# Patient Record
Sex: Female | Born: 1935
Health system: Southern US, Community
[De-identification: ages and names within clinical notes are randomized; demographics above are authoritative.]

## PROBLEM LIST (undated history)

## (undated) DIAGNOSIS — I4891 Unspecified atrial fibrillation: Secondary | ICD-10-CM

## (undated) DIAGNOSIS — E079 Disorder of thyroid, unspecified: Secondary | ICD-10-CM

## (undated) DIAGNOSIS — F419 Anxiety disorder, unspecified: Secondary | ICD-10-CM

## (undated) DIAGNOSIS — I639 Cerebral infarction, unspecified: Secondary | ICD-10-CM

## (undated) DIAGNOSIS — J40 Bronchitis, not specified as acute or chronic: Secondary | ICD-10-CM

## (undated) DIAGNOSIS — I2510 Atherosclerotic heart disease of native coronary artery without angina pectoris: Secondary | ICD-10-CM

## (undated) DIAGNOSIS — Z950 Presence of cardiac pacemaker: Secondary | ICD-10-CM

## (undated) DIAGNOSIS — A809 Acute poliomyelitis, unspecified: Secondary | ICD-10-CM

## (undated) DIAGNOSIS — Z9289 Personal history of other medical treatment: Secondary | ICD-10-CM

## (undated) DIAGNOSIS — M1990 Unspecified osteoarthritis, unspecified site: Secondary | ICD-10-CM

## (undated) DIAGNOSIS — I10 Essential (primary) hypertension: Secondary | ICD-10-CM

## (undated) HISTORY — PX: APPENDECTOMY: SHX54

## (undated) HISTORY — PX: TONSILLECTOMY: SUR1361

## (undated) HISTORY — PX: CORONARY ANGIOPLASTY: SHX604

## (undated) HISTORY — DX: Essential (primary) hypertension: I10

## (undated) HISTORY — PX: OTHER SURGICAL HISTORY: SHX169

## (undated) HISTORY — DX: Personal history of other medical treatment: Z92.89

## (undated) HISTORY — DX: Acute poliomyelitis, unspecified: A80.9

## (undated) HISTORY — PX: CARDIAC SURGERY: SHX584

---

## 1976-11-29 HISTORY — PX: ABDOMINAL HYSTERECTOMY: SHX81

## 2001-09-14 ENCOUNTER — Encounter: Payer: Self-pay | Admitting: Internal Medicine

## 2001-09-14 ENCOUNTER — Ambulatory Visit (HOSPITAL_COMMUNITY): Admission: RE | Admit: 2001-09-14 | Discharge: 2001-09-14 | Payer: Self-pay | Admitting: Internal Medicine

## 2001-12-14 ENCOUNTER — Encounter: Payer: Self-pay | Admitting: Internal Medicine

## 2001-12-14 ENCOUNTER — Ambulatory Visit (HOSPITAL_COMMUNITY): Admission: RE | Admit: 2001-12-14 | Discharge: 2001-12-14 | Payer: Self-pay | Admitting: Internal Medicine

## 2002-08-20 ENCOUNTER — Encounter: Payer: Self-pay | Admitting: Internal Medicine

## 2002-08-20 ENCOUNTER — Ambulatory Visit (HOSPITAL_COMMUNITY): Admission: RE | Admit: 2002-08-20 | Discharge: 2002-08-20 | Payer: Self-pay | Admitting: Internal Medicine

## 2003-07-24 ENCOUNTER — Emergency Department (HOSPITAL_COMMUNITY): Admission: EM | Admit: 2003-07-24 | Discharge: 2003-07-24 | Payer: Self-pay | Admitting: Emergency Medicine

## 2004-08-13 ENCOUNTER — Other Ambulatory Visit: Admission: RE | Admit: 2004-08-13 | Discharge: 2004-08-13 | Payer: Self-pay | Admitting: Dermatology

## 2005-01-05 ENCOUNTER — Other Ambulatory Visit: Admission: RE | Admit: 2005-01-05 | Discharge: 2005-01-05 | Payer: Self-pay | Admitting: Obstetrics and Gynecology

## 2005-05-28 ENCOUNTER — Ambulatory Visit (HOSPITAL_COMMUNITY): Admission: RE | Admit: 2005-05-28 | Discharge: 2005-05-28 | Payer: Self-pay | Admitting: Internal Medicine

## 2005-06-02 ENCOUNTER — Ambulatory Visit (HOSPITAL_COMMUNITY): Admission: RE | Admit: 2005-06-02 | Discharge: 2005-06-02 | Payer: Self-pay | Admitting: Internal Medicine

## 2005-06-07 ENCOUNTER — Ambulatory Visit (HOSPITAL_COMMUNITY): Admission: RE | Admit: 2005-06-07 | Discharge: 2005-06-07 | Payer: Self-pay | Admitting: Internal Medicine

## 2005-06-24 ENCOUNTER — Ambulatory Visit (HOSPITAL_COMMUNITY): Admission: RE | Admit: 2005-06-24 | Discharge: 2005-06-24 | Payer: Self-pay | Admitting: Internal Medicine

## 2005-11-24 ENCOUNTER — Ambulatory Visit: Payer: Self-pay | Admitting: Cardiology

## 2005-11-24 ENCOUNTER — Inpatient Hospital Stay (HOSPITAL_COMMUNITY): Admission: AD | Admit: 2005-11-24 | Discharge: 2005-11-28 | Payer: Self-pay | Admitting: Internal Medicine

## 2006-01-19 ENCOUNTER — Ambulatory Visit: Payer: Self-pay | Admitting: *Deleted

## 2006-01-25 ENCOUNTER — Ambulatory Visit: Payer: Self-pay | Admitting: Cardiology

## 2006-01-25 ENCOUNTER — Encounter (HOSPITAL_COMMUNITY): Admission: RE | Admit: 2006-01-25 | Discharge: 2006-02-24 | Payer: Self-pay | Admitting: *Deleted

## 2006-01-28 ENCOUNTER — Ambulatory Visit: Payer: Self-pay | Admitting: *Deleted

## 2006-06-14 ENCOUNTER — Emergency Department (HOSPITAL_COMMUNITY): Admission: EM | Admit: 2006-06-14 | Discharge: 2006-06-14 | Payer: Self-pay | Admitting: Emergency Medicine

## 2006-06-20 ENCOUNTER — Ambulatory Visit: Payer: Self-pay | Admitting: Orthopedic Surgery

## 2006-06-22 ENCOUNTER — Encounter (HOSPITAL_COMMUNITY): Admission: RE | Admit: 2006-06-22 | Discharge: 2006-07-22 | Payer: Self-pay | Admitting: Orthopedic Surgery

## 2006-07-06 ENCOUNTER — Ambulatory Visit: Payer: Self-pay | Admitting: Orthopedic Surgery

## 2006-07-25 ENCOUNTER — Encounter (HOSPITAL_COMMUNITY): Admission: RE | Admit: 2006-07-25 | Discharge: 2006-08-24 | Payer: Self-pay | Admitting: Orthopedic Surgery

## 2006-08-03 ENCOUNTER — Ambulatory Visit: Payer: Self-pay | Admitting: Orthopedic Surgery

## 2006-09-08 ENCOUNTER — Ambulatory Visit (HOSPITAL_COMMUNITY): Admission: RE | Admit: 2006-09-08 | Discharge: 2006-09-08 | Payer: Self-pay | Admitting: Internal Medicine

## 2006-09-23 ENCOUNTER — Encounter: Admission: RE | Admit: 2006-09-23 | Discharge: 2006-09-23 | Payer: Self-pay | Admitting: Internal Medicine

## 2007-04-11 ENCOUNTER — Encounter: Admission: RE | Admit: 2007-04-11 | Discharge: 2007-04-11 | Payer: Self-pay | Admitting: Internal Medicine

## 2007-11-03 ENCOUNTER — Encounter: Admission: RE | Admit: 2007-11-03 | Discharge: 2007-11-03 | Payer: Self-pay | Admitting: Internal Medicine

## 2007-12-20 ENCOUNTER — Emergency Department (HOSPITAL_COMMUNITY): Admission: EM | Admit: 2007-12-20 | Discharge: 2007-12-20 | Payer: Self-pay | Admitting: Emergency Medicine

## 2007-12-31 DIAGNOSIS — I2510 Atherosclerotic heart disease of native coronary artery without angina pectoris: Secondary | ICD-10-CM

## 2007-12-31 HISTORY — DX: Atherosclerotic heart disease of native coronary artery without angina pectoris: I25.10

## 2008-01-07 ENCOUNTER — Inpatient Hospital Stay (HOSPITAL_COMMUNITY): Admission: EM | Admit: 2008-01-07 | Discharge: 2008-01-16 | Payer: Self-pay | Admitting: Emergency Medicine

## 2008-01-07 ENCOUNTER — Ambulatory Visit: Payer: Self-pay | Admitting: Internal Medicine

## 2008-01-08 ENCOUNTER — Ambulatory Visit: Payer: Self-pay | Admitting: Internal Medicine

## 2008-01-10 ENCOUNTER — Encounter: Payer: Self-pay | Admitting: Cardiovascular Disease

## 2008-01-11 ENCOUNTER — Ambulatory Visit: Payer: Self-pay | Admitting: *Deleted

## 2008-01-12 ENCOUNTER — Encounter: Payer: Self-pay | Admitting: Cardiovascular Disease

## 2008-01-23 ENCOUNTER — Ambulatory Visit: Payer: Self-pay | Admitting: Cardiology

## 2008-02-01 ENCOUNTER — Ambulatory Visit: Payer: Self-pay | Admitting: Internal Medicine

## 2008-02-01 LAB — CONVERTED CEMR LAB
Basophils Relative: 1.3 % — ABNORMAL HIGH (ref 0.0–1.0)
Chloride: 98 meq/L (ref 96–112)
Creatinine, Ser: 0.6 mg/dL (ref 0.4–1.2)
Eosinophils Relative: 1.9 % (ref 0.0–5.0)
Glucose, Bld: 71 mg/dL (ref 70–99)
HCT: 41.1 % (ref 36.0–46.0)
Neutrophils Relative %: 68.3 % (ref 43.0–77.0)
RBC: 4.7 M/uL (ref 3.87–5.11)
RDW: 14.2 % (ref 11.5–14.6)
Sodium: 139 meq/L (ref 135–145)
WBC: 9.6 10*3/uL (ref 4.5–10.5)

## 2008-03-07 ENCOUNTER — Encounter (HOSPITAL_COMMUNITY): Admission: RE | Admit: 2008-03-07 | Discharge: 2008-04-06 | Payer: Self-pay | Admitting: Internal Medicine

## 2008-04-24 ENCOUNTER — Inpatient Hospital Stay (HOSPITAL_COMMUNITY): Admission: EM | Admit: 2008-04-24 | Discharge: 2008-04-25 | Payer: Self-pay | Admitting: Emergency Medicine

## 2008-05-03 ENCOUNTER — Emergency Department (HOSPITAL_COMMUNITY): Admission: EM | Admit: 2008-05-03 | Discharge: 2008-05-03 | Payer: Self-pay | Admitting: Emergency Medicine

## 2008-12-19 ENCOUNTER — Encounter: Admission: RE | Admit: 2008-12-19 | Discharge: 2008-12-19 | Payer: Self-pay | Admitting: Internal Medicine

## 2008-12-24 ENCOUNTER — Encounter: Admission: RE | Admit: 2008-12-24 | Discharge: 2008-12-24 | Payer: Self-pay | Admitting: Internal Medicine

## 2009-01-17 HISTORY — PX: TRANSTHORACIC ECHOCARDIOGRAM: SHX275

## 2009-02-02 ENCOUNTER — Emergency Department (HOSPITAL_COMMUNITY): Admission: EM | Admit: 2009-02-02 | Discharge: 2009-02-02 | Payer: Self-pay | Admitting: Emergency Medicine

## 2009-04-11 ENCOUNTER — Ambulatory Visit (HOSPITAL_COMMUNITY): Admission: RE | Admit: 2009-04-11 | Discharge: 2009-04-11 | Payer: Self-pay | Admitting: Internal Medicine

## 2009-05-13 ENCOUNTER — Emergency Department (HOSPITAL_COMMUNITY): Admission: EM | Admit: 2009-05-13 | Discharge: 2009-05-13 | Payer: Self-pay | Admitting: Pediatrics

## 2009-10-21 ENCOUNTER — Inpatient Hospital Stay (HOSPITAL_COMMUNITY): Admission: EM | Admit: 2009-10-21 | Discharge: 2009-10-21 | Payer: Self-pay | Admitting: Emergency Medicine

## 2009-10-22 HISTORY — PX: OTHER SURGICAL HISTORY: SHX169

## 2010-01-16 ENCOUNTER — Encounter: Admission: RE | Admit: 2010-01-16 | Discharge: 2010-01-16 | Payer: Self-pay | Admitting: Internal Medicine

## 2010-06-13 ENCOUNTER — Encounter: Payer: Self-pay | Admitting: Emergency Medicine

## 2010-06-14 ENCOUNTER — Inpatient Hospital Stay (HOSPITAL_COMMUNITY): Admission: EM | Admit: 2010-06-14 | Discharge: 2010-06-22 | Payer: Self-pay | Admitting: Cardiovascular Disease

## 2010-08-11 ENCOUNTER — Observation Stay (HOSPITAL_COMMUNITY): Admission: RE | Admit: 2010-08-11 | Discharge: 2010-08-12 | Payer: Self-pay | Admitting: Cardiology

## 2010-08-11 HISTORY — PX: PACEMAKER PLACEMENT: SHX43

## 2010-12-20 ENCOUNTER — Encounter: Payer: Self-pay | Admitting: Internal Medicine

## 2011-01-27 ENCOUNTER — Other Ambulatory Visit (HOSPITAL_COMMUNITY): Payer: Self-pay | Admitting: Internal Medicine

## 2011-01-27 ENCOUNTER — Ambulatory Visit (HOSPITAL_COMMUNITY)
Admission: RE | Admit: 2011-01-27 | Discharge: 2011-01-27 | Disposition: A | Discharge status: Home / Self Care | Payer: Medicare Other | Source: Ambulatory Visit | Attending: Internal Medicine | Admitting: Internal Medicine

## 2011-01-27 DIAGNOSIS — I739 Peripheral vascular disease, unspecified: Secondary | ICD-10-CM

## 2011-01-27 DIAGNOSIS — M79609 Pain in unspecified limb: Secondary | ICD-10-CM | POA: Insufficient documentation

## 2011-01-27 DIAGNOSIS — L97509 Non-pressure chronic ulcer of other part of unspecified foot with unspecified severity: Secondary | ICD-10-CM | POA: Insufficient documentation

## 2011-02-11 LAB — GLUCOSE, CAPILLARY
Glucose-Capillary: 100 mg/dL — ABNORMAL HIGH (ref 70–99)
Glucose-Capillary: 133 mg/dL — ABNORMAL HIGH (ref 70–99)

## 2011-02-11 LAB — CBC
Hemoglobin: 12 g/dL (ref 12.0–15.0)
MCH: 28 pg (ref 26.0–34.0)
MCV: 87.6 fL (ref 78.0–100.0)
RBC: 4.29 MIL/uL (ref 3.87–5.11)
WBC: 6.8 10*3/uL (ref 4.0–10.5)

## 2011-02-11 LAB — BASIC METABOLIC PANEL
CO2: 27 mEq/L (ref 19–32)
Calcium: 9 mg/dL (ref 8.4–10.5)
Chloride: 105 mEq/L (ref 96–112)
GFR calc Af Amer: >60 mL/min (ref >60)
Sodium: 140 mEq/L (ref 135–145)

## 2011-02-11 LAB — SURGICAL PCR SCREEN
MRSA, PCR: NEGATIVE
Staphylococcus aureus: POSITIVE — AB

## 2011-02-11 LAB — PROTIME-INR: Prothrombin Time: 18 seconds — ABNORMAL HIGH (ref 11.6–15.2)

## 2011-02-11 LAB — APTT: aPTT: 33 seconds (ref 24–37)

## 2011-02-13 LAB — BASIC METABOLIC PANEL
CO2: 27 mEq/L (ref 19–32)
CO2: 29 mEq/L (ref 19–32)
CO2: 30 mEq/L (ref 19–32)
Calcium: 9.4 mg/dL (ref 8.4–10.5)
Calcium: 8.7 mg/dL (ref 8.4–10.5)
Calcium: 8.8 mg/dL (ref 8.4–10.5)
Calcium: 9 mg/dL (ref 8.4–10.5)
Chloride: 102 mEq/L (ref 96–112)
Chloride: 103 mEq/L (ref 96–112)
Chloride: 104 mEq/L (ref 96–112)
Chloride: 104 mEq/L (ref 96–112)
Creatinine, Ser: 0.64 mg/dL (ref 0.4–1.2)
Creatinine, Ser: 0.62 mg/dL (ref 0.4–1.2)
Creatinine, Ser: 0.66 mg/dL (ref 0.4–1.2)
Creatinine, Ser: 0.67 mg/dL (ref 0.4–1.2)
Creatinine, Ser: 0.69 mg/dL (ref 0.4–1.2)
GFR calc Af Amer: >60 mL/min (ref >60)
GFR calc Af Amer: >60 mL/min (ref >60)
GFR calc Af Amer: >60 mL/min (ref >60)
GFR calc Af Amer: >60 mL/min (ref >60)
GFR calc Af Amer: >60 mL/min (ref >60)
GFR calc non Af Amer: >60 mL/min (ref >60)
GFR calc non Af Amer: >60 mL/min (ref >60)
GFR calc non Af Amer: >60 mL/min (ref >60)
GFR calc non Af Amer: >60 mL/min (ref >60)
Glucose, Bld: 100 mg/dL — ABNORMAL HIGH (ref 70–99)
Glucose, Bld: 91 mg/dL (ref 70–99)
Glucose, Bld: 92 mg/dL (ref 70–99)
Potassium: 3.7 mEq/L (ref 3.5–5.1)
Potassium: 3.8 mEq/L (ref 3.5–5.1)
Potassium: 3.9 mEq/L (ref 3.5–5.1)
Sodium: 136 mEq/L (ref 135–145)
Sodium: 140 mEq/L (ref 135–145)
Sodium: 141 mEq/L (ref 135–145)
Sodium: 141 mEq/L (ref 135–145)

## 2011-02-13 LAB — GLUCOSE, CAPILLARY
Glucose-Capillary: 102 mg/dL — ABNORMAL HIGH (ref 70–99)
Glucose-Capillary: 102 mg/dL — ABNORMAL HIGH (ref 70–99)
Glucose-Capillary: 104 mg/dL — ABNORMAL HIGH (ref 70–99)
Glucose-Capillary: 107 mg/dL — ABNORMAL HIGH (ref 70–99)
Glucose-Capillary: 110 mg/dL — ABNORMAL HIGH (ref 70–99)
Glucose-Capillary: 111 mg/dL — ABNORMAL HIGH (ref 70–99)
Glucose-Capillary: 129 mg/dL — ABNORMAL HIGH (ref 70–99)
Glucose-Capillary: 130 mg/dL — ABNORMAL HIGH (ref 70–99)
Glucose-Capillary: 133 mg/dL — ABNORMAL HIGH (ref 70–99)
Glucose-Capillary: 140 mg/dL — ABNORMAL HIGH (ref 70–99)
Glucose-Capillary: 160 mg/dL — ABNORMAL HIGH (ref 70–99)
Glucose-Capillary: 86 mg/dL (ref 70–99)
Glucose-Capillary: 86 mg/dL (ref 70–99)
Glucose-Capillary: 91 mg/dL (ref 70–99)
Glucose-Capillary: 95 mg/dL (ref 70–99)
Glucose-Capillary: 95 mg/dL (ref 70–99)
Glucose-Capillary: 95 mg/dL (ref 70–99)
Glucose-Capillary: 98 mg/dL (ref 70–99)

## 2011-02-13 LAB — CBC
HCT: 40.7 % (ref 36.0–46.0)
Hemoglobin: 12.7 g/dL (ref 12.0–15.0)
Hemoglobin: 12.8 g/dL (ref 12.0–15.0)
Hemoglobin: 12.8 g/dL (ref 12.0–15.0)
Hemoglobin: 13.3 g/dL (ref 12.0–15.0)
Hemoglobin: 13.4 g/dL (ref 12.0–15.0)
MCH: 28.7 pg (ref 26.0–34.0)
MCH: 28.8 pg (ref 26.0–34.0)
MCH: 29.1 pg (ref 26.0–34.0)
MCH: 29.5 pg (ref 26.0–34.0)
MCH: 29.6 pg (ref 26.0–34.0)
MCHC: 32.3 g/dL (ref 30.0–36.0)
MCHC: 33 g/dL (ref 30.0–36.0)
MCHC: 33 g/dL (ref 30.0–36.0)
MCHC: 33.3 g/dL (ref 30.0–36.0)
MCHC: 33.4 g/dL (ref 30.0–36.0)
MCHC: 33.4 g/dL (ref 30.0–36.0)
MCV: 88 fL (ref 78.0–100.0)
MCV: 88.2 fL (ref 78.0–100.0)
MCV: 88.8 fL (ref 78.0–100.0)
Platelets: 189 10*3/uL (ref 150–400)
Platelets: 158 10*3/uL (ref 150–400)
Platelets: 177 10*3/uL (ref 150–400)
Platelets: 177 10*3/uL (ref 150–400)
Platelets: 184 10*3/uL (ref 150–400)
RBC: 4.82 MIL/uL (ref 3.87–5.11)
RBC: 4.36 MIL/uL (ref 3.87–5.11)
RBC: 4.39 MIL/uL (ref 3.87–5.11)
RBC: 4.42 MIL/uL (ref 3.87–5.11)
RBC: 4.47 MIL/uL (ref 3.87–5.11)
RBC: 4.6 MIL/uL (ref 3.87–5.11)
RBC: 4.61 MIL/uL (ref 3.87–5.11)
RDW: 15.1 % (ref 11.5–15.5)
RDW: 15.1 % (ref 11.5–15.5)
RDW: 15.2 % (ref 11.5–15.5)
WBC: 8.2 10*3/uL (ref 4.0–10.5)
WBC: 7.9 10*3/uL (ref 4.0–10.5)
WBC: 8.1 10*3/uL (ref 4.0–10.5)
WBC: 8.3 10*3/uL (ref 4.0–10.5)
WBC: 9.4 10*3/uL (ref 4.0–10.5)

## 2011-02-13 LAB — PROTIME-INR
INR: 2.02 — ABNORMAL HIGH (ref 0.00–1.49)
INR: 1.45 (ref 0.00–1.49)
INR: 1.82 — ABNORMAL HIGH (ref 0.00–1.49)
INR: 2.12 — ABNORMAL HIGH (ref 0.00–1.49)
INR: 2.14 — ABNORMAL HIGH (ref 0.00–1.49)
Prothrombin Time: 22.7 seconds — ABNORMAL HIGH (ref 11.6–15.2)
Prothrombin Time: 17.5 seconds — ABNORMAL HIGH (ref 11.6–15.2)
Prothrombin Time: 20.9 seconds — ABNORMAL HIGH (ref 11.6–15.2)
Prothrombin Time: 21 seconds — ABNORMAL HIGH (ref 11.6–15.2)
Prothrombin Time: 23.6 seconds — ABNORMAL HIGH (ref 11.6–15.2)
Prothrombin Time: 23.7 seconds — ABNORMAL HIGH (ref 11.6–15.2)
Prothrombin Time: 23.7 seconds — ABNORMAL HIGH (ref 11.6–15.2)

## 2011-02-13 LAB — LIPID PANEL
Cholesterol: 191 mg/dL (ref 0–200)
Total CHOL/HDL Ratio: 3.6 RATIO

## 2011-02-13 LAB — TSH
TSH: 0.186 u[IU]/mL — ABNORMAL LOW (ref 0.350–4.500)
TSH: 0.581 u[IU]/mL (ref 0.350–4.500)

## 2011-02-13 LAB — CARDIAC PANEL(CRET KIN+CKTOT+MB+TROPI)
CK, MB: 1.2 ng/mL (ref 0.3–4.0)
CK, MB: 1.6 ng/mL (ref 0.3–4.0)
Total CK: 58 U/L (ref 7–177)
Total CK: 74 U/L (ref 7–177)
Troponin I: 0.01 ng/mL (ref 0.00–0.06)

## 2011-02-13 LAB — HEPARIN LEVEL (UNFRACTIONATED)
Heparin Unfractionated: 0.6 IU/mL (ref 0.30–0.70)
Heparin Unfractionated: 0.71 IU/mL — ABNORMAL HIGH (ref 0.30–0.70)
Heparin Unfractionated: 0.85 IU/mL — ABNORMAL HIGH (ref 0.30–0.70)
Heparin Unfractionated: <0.1 IU/mL — ABNORMAL LOW (ref 0.30–0.70)

## 2011-02-13 LAB — DIFFERENTIAL
Eosinophils Absolute: 1 10*3/uL — ABNORMAL HIGH (ref 0.0–0.7)
Lymphocytes Relative: 26 % (ref 12–46)
Lymphs Abs: 2.2 10*3/uL (ref 0.7–4.0)
Neutrophils Relative %: 53 % (ref 43–77)

## 2011-02-13 LAB — HEPATIC FUNCTION PANEL
ALT: 16 U/L (ref 0–35)
AST: 22 U/L (ref 0–37)
Indirect Bilirubin: 0.5 mg/dL (ref 0.3–0.9)
Total Protein: 6.3 g/dL (ref 6.0–8.3)

## 2011-02-13 LAB — CEA: CEA: 0.6 ng/mL (ref 0.0–5.0)

## 2011-02-13 LAB — POCT CARDIAC MARKERS
CKMB, poc: 1.1 ng/mL (ref 1.0–8.0)
CKMB, poc: 1.3 ng/mL (ref 1.0–8.0)
Myoglobin, poc: 56.5 ng/mL (ref 12–200)

## 2011-02-13 LAB — T3: T3, Total: 91.1 ng/dl (ref 80.0–204.0)

## 2011-02-13 LAB — APTT
aPTT: 37 seconds (ref 24–37)
aPTT: 38 seconds — ABNORMAL HIGH (ref 24–37)

## 2011-02-13 LAB — URINALYSIS, MICROSCOPIC ONLY
Hgb urine dipstick: NEGATIVE
Ketones, ur: NEGATIVE mg/dL
Protein, ur: NEGATIVE mg/dL
Urobilinogen, UA: 1 mg/dL (ref 0.0–1.0)

## 2011-02-13 LAB — T4: T4, Total: 7.7 ug/dL (ref 5.0–12.5)

## 2011-02-13 LAB — MAGNESIUM: Magnesium: 2 mg/dL (ref 1.5–2.5)

## 2011-03-03 LAB — LIPID PANEL
LDL Cholesterol: 127 mg/dL — ABNORMAL HIGH (ref 0–99)
Total CHOL/HDL Ratio: 3.9 RATIO
Triglycerides: 94 mg/dL (ref <150)
VLDL: 19 mg/dL (ref 0–40)

## 2011-03-03 LAB — CBC
HCT: 39.1 % (ref 36.0–46.0)
MCHC: 33.2 g/dL (ref 30.0–36.0)
MCHC: 33.3 g/dL (ref 30.0–36.0)
MCV: 89.1 fL (ref 78.0–100.0)
Platelets: 176 10*3/uL (ref 150–400)
Platelets: 187 10*3/uL (ref 150–400)
RDW: 14 % (ref 11.5–15.5)
RDW: 14.2 % (ref 11.5–15.5)
WBC: 7.1 10*3/uL (ref 4.0–10.5)

## 2011-03-03 LAB — BASIC METABOLIC PANEL
BUN: 16 mg/dL (ref 6–23)
CO2: 29 mEq/L (ref 19–32)
GFR calc non Af Amer: >60 mL/min (ref >60)
Glucose, Bld: 157 mg/dL — ABNORMAL HIGH (ref 70–99)
Potassium: 3.7 mEq/L (ref 3.5–5.1)

## 2011-03-03 LAB — CARDIAC PANEL(CRET KIN+CKTOT+MB+TROPI)
Relative Index: 2.3 (ref 0.0–2.5)
Troponin I: 0.01 ng/mL (ref 0.00–0.06)

## 2011-03-03 LAB — DIFFERENTIAL
Basophils Absolute: 0.1 10*3/uL (ref 0.0–0.1)
Basophils Relative: 1 % (ref 0–1)
Eosinophils Absolute: 0.1 10*3/uL (ref 0.0–0.7)
Eosinophils Relative: 3 % (ref 0–5)
Lymphocytes Relative: 33 % (ref 12–46)

## 2011-03-03 LAB — PROTIME-INR: Prothrombin Time: 31.2 seconds — ABNORMAL HIGH (ref 11.6–15.2)

## 2011-03-03 LAB — CK TOTAL AND CKMB (NOT AT ARMC)
CK, MB: 2.8 ng/mL (ref 0.3–4.0)
Relative Index: 2.5 (ref 0.0–2.5)

## 2011-03-03 LAB — POCT I-STAT, CHEM 8
Calcium, Ion: 1.14 mmol/L (ref 1.12–1.32)
Glucose, Bld: 85 mg/dL (ref 70–99)
HCT: 44 % (ref 36.0–46.0)
Hemoglobin: 15 g/dL (ref 12.0–15.0)
TCO2: 29 mmol/L (ref 0–100)

## 2011-03-03 LAB — TROPONIN I: Troponin I: 0.03 ng/mL (ref 0.00–0.06)

## 2011-03-03 LAB — POCT CARDIAC MARKERS
CKMB, poc: 1.6 ng/mL (ref 1.0–8.0)
Troponin i, poc: <0.05 ng/mL (ref 0.00–0.09)

## 2011-03-08 LAB — DIFFERENTIAL
Basophils Absolute: 0 10*3/uL (ref 0.0–0.1)
Basophils Relative: 0 % (ref 0–1)
Lymphocytes Relative: 26 % (ref 12–46)
Monocytes Absolute: 0.6 10*3/uL (ref 0.1–1.0)
Monocytes Relative: 8 % (ref 3–12)
Neutro Abs: 4.9 10*3/uL (ref 1.7–7.7)
Neutrophils Relative %: 64 % (ref 43–77)

## 2011-03-08 LAB — CBC
HCT: 40.1 % (ref 36.0–46.0)
Hemoglobin: 13.9 g/dL (ref 12.0–15.0)
MCHC: 34.5 g/dL (ref 30.0–36.0)
MCV: 87.7 fL (ref 78.0–100.0)
Platelets: 160 10*3/uL (ref 150–400)
RDW: 14.5 % (ref 11.5–15.5)

## 2011-03-08 LAB — COMPREHENSIVE METABOLIC PANEL
Albumin: 3.9 g/dL (ref 3.5–5.2)
BUN: 19 mg/dL (ref 6–23)
Calcium: 9.2 mg/dL (ref 8.4–10.5)
Creatinine, Ser: 0.67 mg/dL (ref 0.4–1.2)
Glucose, Bld: 89 mg/dL (ref 70–99)
Total Protein: 6.2 g/dL (ref 6.0–8.3)

## 2011-04-13 NOTE — Cardiovascular Report (Signed)
[name], [name]                ACCOUNT NO.:  [id number]   MEDICAL RECORD NO.:  [id number]          PATIENT TYPE:  INP   LOCATION:  2921                         FACILITY:  [facility]   PHYSICIAN:  Veverly Fells. Excell Seltzer, MD  DATE OF BIRTH:  1930-05-21   DATE OF PROCEDURE:  01/10/2008  DATE OF DISCHARGE:                            CARDIAC CATHETERIZATION   PROCEDURE:  PTCA and stenting of the left circumflex.   INDICATIONS:  [name] is a 77 year old woman who presented with  unstable angina and atrial fibrillation.  She has multiple cardiac risk  factors.  She underwent diagnostic catheterization earlier today by Dr.  [name] and was found to have critical stenosis of the left circumflex  with a 99% lesion in the mid vessel.  The remaining portions of the  vessel look relatively normal, and there is no other significant  obstructive disease in the LAD or right coronary artery.  We elected to  proceed with PCI.  I plan is to treat her with a bare metal stent in the  setting of atrial fibrillation and need for anticoagulation with  Coumadin.   Risks and indications of the procedure were reviewed with the patient.  Informed consent was obtained.  The right femoral artery had been  accessed, and there was a sheath in place.  Using normal sterile  technique, the sheath was changed out for a new 6-French sheath over a  short guide wire.  Angiomax was used for anticoagulation.  The patient  had been preloaded with Plavix.  Once a therapeutic ACT was achieved, an  XB 3.5 cm guide catheter was inserted, and a set up cine was taken.  This demonstrated severe stenosis of the mid left circumflex which  otherwise appeared to be a large-caliber vessel.  A cougar wire was  passed easily beyond the area of stenosis, and the lesion was predilated  with a 2.0 x 12-mm Maverick balloon up to 10 atmospheres.  Following  predilatation, there was improvement in flow, as well as lesion  characteristics.   Follow-up angiography after nitroglycerin demonstrated  a longer segment of disease between the areas of normal vessel in the  proximal and distal ends.  I elected to cover the entire area with a 3.0  x 24-mm Liberte stent.  The stent was carefully positioned and then  deployed at 14 atmospheres.  Following stenting, there was an excellent  angiographic result with a widely patent stent and TIMI III flow.  I  elected to post dilate with a 3.25 x 20-mm Quantum Maverick which was  inflated to 20 atmospheres.  At the completion of the procedure, there  was an excellent angiographic result with TIMI III flow.  The patient  had no chest pain.  She tolerated the procedure well with no immediate  complications.   CONCLUSION:  Successful PCI of the left circumflex with a 3.0 x 24-mm  bare metal stent.   RECOMMENDATIONS:  In the setting of chronic atrial fibrillation, will  treat with aspirin 81 mg and resume Coumadin tonight.  Will resume  heparin 12 hours after her sheath  is out.  Plavix has been initiated,  and she should complete 30 days prior to discontinuation.      Veverly Fells. Excell Seltzer, MD  Electronically Signed     MDC/MEDQ  D:  01/10/2008  T:  01/11/2008  Job:  [id number]

## 2011-04-13 NOTE — Cardiovascular Report (Signed)
[name], [name]                ACCOUNT NO.:  [id number]   MEDICAL RECORD NO.:  [id number]          PATIENT TYPE:  INP   LOCATION:  2921                         FACILITY:  [facility]   PHYSICIAN:  [name], MD, FACCDATE OF BIRTH:  November 06, 1930   DATE OF PROCEDURE:  01/10/2008  DATE OF DISCHARGE:                            CARDIAC CATHETERIZATION   INDICATIONS FOR PROCEDURE:  Atrial fibrillation, chest pain, and  abnormal EKG.   The patient came into the lab in rapid atrial fibrillation.  Cardizem  bolus and drip were given.   Heart rate decreased from 150 to 110 range.  Ongoing rate control will  be done during the case.   Standard catheterization was done with 6 French catheters from the right  femoral artery.   The left main coronary artery was normal.   The left anterior descending artery was normal.   First diagonal branch was normal.   Circumflex coronary artery primarily consisted of a large obtuse  marginal branch.  There was a 95% discrete lesion in the mid OM.   The right coronary artery was dominant.  There was 30-40% multiple  discrete lesions in the proximal vessel, mid to distal RCA as well as  PDA and PLA were normal.   RAO ventriculography showed global hypokinesis worse in the inferior  basal wall.  The EF was in the 50% range.  There was +1 angiographic MR.  Reassessment of LV function may be worthwhile when the patient is not in  rapid atrial fibrillation.  Aortic pressure is 130/80, LV pressure is  131/8.   IMPRESSION:  The sheath will be sewn in.  The patient will be referred  for angioplasty and stenting of the mid OM.      [name], MD, [facility]  Electronically Signed     PCN/MEDQ  D:  01/10/2008  T:  01/11/2008  Job:  [id number]

## 2011-04-13 NOTE — Assessment & Plan Note (Signed)
[facility]                            CARDIOLOGY OFFICE NOTE   NAME:Todisco, ROLINDA IMPSON                       MRN:          [id number]  DATE:02/01/2008                            DOB:          1930/11/09    IDENTIFICATION:  Ms. [name] is a 77 year old woman whom I saw in  [place] for the first time a few weeks ago.  She was admitted there  with epigastric discomfort.  She had a slight bump in her cardiac  enzymes.  Abdominal ultrasound was negative.  Based on this, she went on  to have cardiac catheterization.  This was done on February 11.  The  patient had a critical stenosis of the left circumflex, 99% in the mid  vessel.  The rest of the circumflex was normal.  There was no  significant obstructive disease in the LAD or RCA.  The patient  underwent PCI/stent (bare metal) to the circumflex.  Post procedure was  complicated by retroperitoneal hematoma (8.9 x 4.7 cm).  She was finally  discharged home after being stabilized on February 17.   Since discharge, she has been weak.  She is slowly getting her strength  back, she thinks, a little stronger every day.  Every once all she has  episodes of fleeting chest pain that lasts seconds.  Not associated with  any particular activity.   ALLERGIES:  SULFA and PENICILLIN.   CURRENT MEDICATIONS:  1. Include Plavix 75 daily (for a total of 1 month after procedure).  2. Diltiazem CD 120.  3. Digoxin 0.125.  4. Synthroid 100 mcg.  5. Pravastatin 40.  6. Metoprolol 100 b.i.d.  7. Coumadin as directed.   PAST MEDICAL HISTORY:  1. Atrial fibrillation.  2. CAD.  3. Hypothyroidism.  4. Dyslipidemia.  Lipid panel in the hospital LDL 112, HDL of 50,      triglycerides 102, cholesterol of 182.  5. Hypertension.  6. Partially did to the detached posterior vitreous.  7. DJD.   PHYSICAL EXAM:  On exam the patient is currently in no acute distress.  Blood pressure is 147/81 pulse is 56 and irregular, weight  200.  NECK:  No JVD.  Lungs are clear.  Cardiac exam irregularly irregular, S1-S2.  No S3.  ABDOMEN:  Benign.  No obvious masses.  Right groin without swelling.  No  bruit.  EXTREMITIES:  No edema.   A 12-lead EKG atrial fibrillation 58 beats per minute.  Nonspecific T-  wave changes.   IMPRESSION:  1. Coronary artery disease.  Doing fairly well postprocedure.  I would      like to get some repeat labs to make sure hemoglobin has not gone      down.  Continue on Plavix for 1 month total and then aspirin and      Coumadin.  2. Dyslipidemia.  Will need to have a follow lipid panel in about 4 to      6 weeks.  3. History of retroperitoneal bleed.  Will repeat CBC and INR today.  4. Atrial fibrillation.  Check INR today.  Check BMET and  magnesium.      She has some cramping in the toes.   I will set to see the patient back in 2-3 weeks in [place].  Be in  touch with her regarding the blood work.     Pricilla Riffle, MD, [facility]  Electronically Signed    PVR/MedQ  DD: 02/01/2008  DT: 02/02/2008  Job #: [id number]   cc:   Kingsley Callander. Ouida Sills, MD

## 2011-04-13 NOTE — H&P (Signed)
[name], [name]                ACCOUNT NO.:  [id number]   MEDICAL RECORD NO.:  [id number]          PATIENT TYPE:  INP   LOCATION:  IC03                          FACILITY:  [facility]   PHYSICIAN:  Melvyn Novas, MDDATE OF BIRTH:  23-Nov-1930   DATE OF ADMISSION:  01/07/2008  DATE OF DISCHARGE:  LH                              HISTORY & PHYSICAL   The patient is a 77 year old white female patient of Dr. Ouida Sills who  essentially was fine until this morning.  She complained of an  epigastric and right upper quadrant pain which was worsened by eating  oatmeal this morning, which persisted for an hour, associated with  nausea but no vomiting, no hematemesis, no melena, or hematochezia.  She  had some mild dyspnea, but denied diaphoresis, palpitations, dizziness,  or syncope.  The patient was on her way to church and reported to the  ER, and she was found to be in chronic atrial fibrillation.  She is on  Coumadin.  Cardiac enzymes were essentially normal initially, with the  exception of mildly elevated troponin of 0.12.  She was subsequently  admitted to the ICU to rule out any ischemic component.  However, the  clinical pattern is more suggestive of biliary dysfunction at present.  She will be placed on a clear liquid diet.   PAST MEDICAL HISTORY:  1. Chronic atrial fibrillation, on anticoagulation.  2. Hypertension.  3. Hyperlipidemia.  4. Hypothyroidism.   PAST SURGICAL HISTORY:  1. TAH-BSO.  2. Appendectomy.   She is allergic to PENICILLIN and SULFA.   CURRENT MEDICATIONS:  1. Diltiazem 240 p.o. daily.  2. Welchol 3 tabs b.i.d.  3. Synthroid 100 mcg per day.  4. Coumadin 1/2 tablet, unknown dosage per day.   She is a nonsmoker, nondrinker, and lives with her family.   PHYSICAL EXAMINATION:  VITAL SIGNS:  Blood pressure at present is  134/82, pulse rate is 90, respiratory rate is 20, temperature is 97.4  orally.  HEENT:  Eyes:  Extraocular movements full.  Sclerae  clear.  Conjunctivae  pink.  Throat:  She has no erythema or exudate.  NECK:  Shows no JVD, no carotid bruits, no thyromegaly or thyroid  bruits.  LUNGS:  Clear to A&P.  No rales, wheezes, or rhonchi appreciable.  HEART:  Regular rate and rhythm.  No murmurs, gallops, heaves, thrills,  or rubs.  ABDOMEN:  There is no epigastric tenderness.  No right upper quadrant  tenderness.  Murphy's sign is negative.  Bowel sounds are normoactive.  No peristaltic rushes.  No guarding or rebound.  No detectable  organomegaly.  EXTREMITIES:  Show trace to 1+ pedal edema.  NEUROLOGIC:  Cranial nerves are grossly intact.  The patient moves all  four extremities.   IMPRESSION:  1. Epigastric right upper quadrant pain, with positive troponins.      Rule out ischemic component.  Rule out myocardial infarction.  2. Rule out gallbladder dysfunction.  3. Chronic atrial fibrillation, on anticoagulation.  4. Hypertension.  5. Hyperlipidemia.  6. Hypothyroidism.   PLAN:  Admit.  Place on IV nitroglycerin.  Add aspirin.  Continue  Coumadin.  Check PT/INR daily, subtherapeutic at present.  Get abdominal  sonogram in a.m.  Clear liquid diet only.  Get stat liver profile, not  on chart yet.  I will make further recommendations as the database  expands.      Melvyn Novas, MD  Electronically Signed     RMD/MEDQ  D:  01/07/2008  T:  01/08/2008  Job:  [id number]

## 2011-04-13 NOTE — Discharge Summary (Signed)
[name], [name]                ACCOUNT NO.:  [id number]   MEDICAL RECORD NO.:  [id number]          PATIENT TYPE:  INP   LOCATION:  IC03                          FACILITY:  [facility]   PHYSICIAN:  Kingsley Callander. Ouida Sills, MD       DATE OF BIRTH:  Apr 02, 1930   DATE OF ADMISSION:  01/07/2008  DATE OF DISCHARGE:  02/09/2009LH                               DISCHARGE SUMMARY   DISCHARGE DIAGNOSES:  1. Unstable angina versus non-ST segment elevation myocardial      infarction.  2. Chronic atrial fibrillation.  3. Hyperlipidemia.  4. Hypothyroidism.   HOSPITAL COURSE:  This patient is a 77 year old female who presented  with epigastric pain.  She felt near syncopal.  She denied vomiting,  shortness of breath, or diaphoresis.  Her oxygen saturation was 97%.  Her EKG revealed atrial fibrillation with an incomplete right bundle  branch block and nonspecific ST segment changes in the inferolateral  leads.  Initial laboratory studies revealed a normal CBC and amylase.  Her chest x-ray revealed mild cardiomegaly and a nonspecific bowel-gas  pattern.  Her initial troponin was 0.12 with a CK of 120 and CK-MB of  3.4.  She was hospitalized in the CCU.  She was treated with aspirin and  IV nitroglycerin.  Coumadin was held.  She was seen in cardiology  consultation by Dr. Tenny Craw and arrangements were made for transfer to  [facility] for cardiac catheterization.  Her cardiac enzymes on the  night at 1530 revealed a troponin I of 0.30.  Her troponin I at 0535 was  0.93.  Her troponin on the 8th at 2144 was 0.90.  She was pain free at  the time of my exam.  She underwent an ultrasound which revealed a  noncystic liver lesion which can be followed up in the future with a CT.  LFTs were normal.   A lipid profile revealed a cholesterol of 182 with an LDL of 112, HDL  50, and triglycerides of 102.  Her INR was 1.7.  She had previously been  therapeutic with an INR of 2.7 on January 21.   She has had a recent  evaluation by ENT for throat symptoms.  She  describes there being a concern for stroke at the time of that exam and  she was further evaluated in [place] for that.  I do not have any  records back though on that evaluation.   As stated, arrangements were made for transfer to [facility].  She was stable  at the time of discharge.   DISCHARGE MEDICATIONS:  1. Heparin at 1280 units per hour.  2. Lopressor 25 mg q.6.  3. Diltiazem was stopped.  4. Synthroid 100 mcg daily.  5. Welchol 3 tabs b.i.d.  6. Aspirin 325 mg daily.  7. Coumadin has been held.  8. Ativan 0.5 mg q.h.s. and q.12 p.r.n.      Kingsley Callander. Ouida Sills, MD  Electronically Signed     ROF/MEDQ  D:  01/08/2008  T:  01/09/2008  Job:  [id number]

## 2011-04-13 NOTE — Discharge Summary (Signed)
NAMESMT., LODER                ACCOUNT NO.:  [id number]   MEDICAL RECORD NO.:  [id number]          PATIENT TYPE:  INP   LOCATION:  2040                         FACILITY:  [facility]   PHYSICIAN:  [name], MD, FACCDATE OF BIRTH:  Dec 06, 1929   DATE OF ADMISSION:  01/08/2008  DATE OF DISCHARGE:  01/16/2008                               DISCHARGE SUMMARY   PROCEDURES:  1. Cardiac catheterization.  2. Coronary arteriogram.  3. Left ventriculogram.  4. Percutaneous transluminal coronary angioplasty and bare metal stent      to one vessel.  5. CT of the abdomen and pelvis.   PRIMARY FINAL DISCHARGE DIAGNOSIS:  Non-ST segment elevation myocardial  infarction.   SECONDARY DIAGNOSES:  1. Retroperitoneal bleed with 8.9 x 4.7-cm hematoma on CT.  2. Chronic atrial fibrillation  3. Chronic anticoagulation with Coumadin.  4. Hypothyroidism.  5. Dyslipidemia with a total cholesterol of 182, triglycerides 102,      HDL 50, LDL 112, Pravachol started this admission and Welchol is      currently on hold.  6. Acute blood loss anemia.  7. Hypotension secondary to blood loss.  8. Allergy or intolerance to PENICILLIN and SULFA.  9. History of hypertension.  10.History of a partially-attached posterior vitreous.  11.Status post hysterectomy, appendectomy and tonsillectomy.  12.History of lumbar disk disease.  13.Remote history of polio.   TIME AT DISCHARGE:  49 minutes.   HOSPITAL COURSE:  [name] is a 77 year old female with no known  history of coronary artery disease.  She has known atrial fibrillation  and is chronically anticoagulated with Coumadin.  She had epigastric  discomfort on February 8 and when it did not resolve went to [facility].  There her cardiac enzymes were cycled and were abnormal.  She  was seen by cardiology and transferred to [facility] for further  evaluation and cardiac catheterization.   Her CK-MB and troponin peaked at 130/9.1 with a troponin  of 0.93.  A  cardiac catheterization was performed on January 10, 2008, which showed  normal left main, normal LAD, mid circumflex 99% treated with PTCA and a  bare metal stent reducing the stenosis to 0, RCA 30-40%, EF 50%.  She  tolerated the procedure well.  After sheath removal she developed  hypotension and nausea and abdominal pain.  A CT was ordered, which  showed a large retroperitoneal hematoma.  She became hypotensive, pale  and diaphoretic.  She was transferred to the unit and hydrated.  She had  serial CBCs performed and her hemoglobin dropped to 8.5.  She received 1  unit of blood and 2 units of fresh frozen plasma.  Her bleeding stopped  and her hemoglobin and hematocrit stabilized.  At discharge her  hemoglobin was 10.9 with a hematocrit of 32.6.  Iron studies can be  performed as an outpatient if she does not improve.  For obvious  reasons, her Coumadin and heparin were on hold.  Aspirin was  discontinued because of the retroperitoneal bleed but once the bleeding  was under control, the Plavix was restarted.  She was also started on  DVT Lovenox but not fully anticoagulated with Lovenox.  Her Coumadin was  restarted prior to discharge, but at discharge her INR is only 1.1.  She  is to continue taking Coumadin at a reduced dose and follow up with the  Coumadin Clinic.   [name] has a history of atrial fibrillation, but her heart rate was  difficult to control.  This was felt at least partly due to her acute  blood loss.  Her medications were adjusted and a beta blocker was added.  The Cardizem dose was decreased and the beta blocker was up-titrated.  Digoxin was added as well.  Her atrial fibrillation is under better  control at this time and she is to follow up as an outpatient.   [name] was seen by cardiac rehab and seen by PT and OT as well.  She is encouraged to exercise per cardiac rehab guidelines, but physical  therapy did not feel that she had any acute  needs and felt that she  could increase her ambulation and activity on her.   [name] was seen by Dr. [name] on January 16, 2008.  Although weak,  her strength was improving and she was ambulating without chest pain or  shortness of breath.  He evaluated her and her stable for discharge with  close outpatient follow-up.   DISCHARGE INSTRUCTIONS:  1. Her activity level is to be increased gradually.  2. She is to call our office for any problems with the catheterization      site.  3. She has a Coumadin clinic appointment this Friday at 9:30 and is to      follow up with Dr. Tenny Craw in [facility] March 2 at 2 p.m.  She is to      follow up with Dr. Ouida Sills as needed.   DISCHARGE MEDICATIONS:  1. Plavix 75 mg daily for 30 days.  2. Warfarin 5 mg, alternate 1 tablet and 1/2 tablet (PTA on 1/2 tablet      daily).  3. Levothyroxine 100 mcg daily (TSH 0.784 on admission).  4. Chlorthalidone and Welchol are currently on hold for now.  5. Pravachol 40 mg daily.  6. Digoxin 0.25 mg daily.  7. Metoprolol 100 mg b.i.d.  8. Cardizem CD 120 mg daily.  9. Nitroglycerin sublingual p.r.n.  10.She is to stop diltiazem CD 240 mg.      Theodore Demark, PA-C      [name], MD, [facility]  Electronically Signed    RB/MEDQ  D:  01/16/2008  T:  01/17/2008  Job:  [id number]   cc:   Kingsley Callander. Ouida Sills, MD

## 2011-04-13 NOTE — Consult Note (Signed)
[name], REDDINGER                ACCOUNT NO.:  [id number]   MEDICAL RECORD NO.:  [id number]          PATIENT TYPE:  INP   LOCATION:  IC03                          FACILITY:  [facility]   PHYSICIAN:  Pricilla Riffle, MD, FACCDATE OF BIRTH:  1930/10/28   DATE OF CONSULTATION:  01/08/2008  DATE OF DISCHARGE:  01/08/2008                                 CONSULTATION   IDENTIFICATION:  [name] is a 77 year old we are asked to see  regarding abnormal cardiac enzymes.   HISTORY OF PRESENT ILLNESS:  The patient has no known history of  coronary artery disease.  She has a history of atrial fibrillation and  hypertension.   She was admitted on February 8; that was yesterday morning. She woke up,  had mild discomfort in her epigastric area, ate some oatmeal thinking it  would help, but it got worse.  She wanted to go to church and preceded  to church, but by the time she got there, she felt miserable, and she  was brought to the emergency room at [facility].   She had one other spell after the holidays of similar discomfort.  She  lay down, and it went away.  She was under increased stress at this  time. She was given nitroglycerin, and the symptoms improved yesterday.  Now they are gone.   ALLERGIES:  PENICILLIN and SULFA.   PAST MEDICAL HISTORY:  1. Atrial fibrillation.  2. Hypertension.  3. Dyslipidemia.  4. Hypothyroidism.  5. History of a partially detached posterior vitreous.  The patient      was seen at [facility] and also went to the emergency room in      January.  Note:  An MRI showed no acute brain abnormality.  She has      complained of some memory issues since.   PAST SURGICAL HISTORY:  1. Status post TAH-BSO.  2. Status post appendectomy.   MEDICATIONS ON ADMISSION:  1. Diltiazem 240.  2. Welchol, question dose up to 3 tablets.  3. Synthroid 100.  4. Coumadin as directed.  5. Prevacid.   SOCIAL HISTORY:  The patient is married, does not smoke, does  not drink.   REVIEW OF SYSTEMS:  Had a nonischemic Myoview in February 2007 with EF  of 56%.  Echocardiogram in December 2006 showed moderate left atrial  enlargement.  LVEF of approximately 50%.  Mild MR.  The patient notes  some sore throat for about a month, has taken antibiotics x2.  She feels  chronically tired had polio as a child her breathing been okay.  Otherwise negative to the above problem except as noted above.   FAMILY HISTORY:  Mother died of cancer.  Father unknown.  Maternal  grandmother with angina but lived into her 76s.   PHYSICAL EXAMINATION:  GENERAL:  The patient currently in no acute  distress.  VITAL SIGNS:  Blood pressure is 99-122/50-80, pulse 50-70. On telemetry,  atrial fibrillation 70-90.  Temperature is 98.3.  HEENT:  Normocephalic, atraumatic.  EOMI.  PERRLA.  Mucous membranes are  moist.  Throat  clear.  NECK:  No bruits.  No JVD.  No thyromegaly.  LUNGS:  Clear to auscultation without rales or wheezes.  CHEST:  Minimal left breast tenderness, different from symptoms that  brought her in.  CARDIAC:  Exam reveals irregular rate and rhythm, S1-S2.  No S3.  No  significant murmurs.  ABDOMEN:  No significant areas of tenderness.  No hepatomegaly.  Positive bowel sounds.  EXTREMITIES:  2+ distal pulses throughout.  No lower extremity edema.   LABORATORY DATA:  Significant for a hemoglobin of 15, WBC of 9,  platelets of 243. Amylase 39, lipase 19. BUN  17, creatinine 0.6,  potassium of 3.5. INR of 1.4 on admission. Troponin 0.12; 0.9; 0.93. CK-  MB 120/3.4; 130/9; 104/7.2.   A 12-lead EKG shows atrial fibrillation, 82 beats per minute, ST  depression V5-V6, 0.5 mm.  No old EKG to compare.   Abdominal ultrasound showed no acute findings.  There is a 1.6 cm  hypoechoic nodule on the left lobe of liver, not felt to be a cyst.  Recommend CT. The patient and her husband think that has been there  before. Gallbladder sludge noted on this ultrasound.    Chest x-ray:  Mild cardiomegaly.   IMPRESSION:  1. The patient is a 77 year old with atrial fibrillation.  She was      admitted with epigastric discomfort yesterday, one felt prior to      this in December-January. Enzymes so far show slight bump in      troponin. Abdominal ultrasound is negative.  For now, I would      recommend left heart catheterization to define anatomy, aspirin,      heparin, change  to Lopressor 25 q. 6 h.  Have confirmed with [facility] that, with her posterior vitreal detachment,      anticoagulation is okay.  2. Atrial fibrillation, rate controlled.  Move diltiazem to the beta      blocker.  3. Dyslipidemia.  Check lipids on Welchol but will need a statin.  4. Hypothyroidism.  Continue on Synthroid.  5. Memory issues discussed with Dr. Ouida Sills. The patient notes some      memory problems since December when vision problems began. Will      need to be followed clinically.  Note:  MRI was negative.   Plan to transfer the patient today to [facility] for further care and  catheterization,  The patient understands risks and benefits of  procedure and agrees to proceed.      Pricilla Riffle, MD, [facility]  Electronically Signed     PVR/MEDQ  D:  01/08/2008  T:  01/09/2008  Job:  [id number]

## 2011-04-16 NOTE — Discharge Summary (Signed)
[name], [name]                ACCOUNT NO.:  [id number]   MEDICAL RECORD NO.:  [id number]          PATIENT TYPE:  INP   LOCATION:  A211                          FACILITY:  [facility]   PHYSICIAN:  Kingsley Callander. Ouida Sills, MD       DATE OF BIRTH:  1930-10-14   DATE OF ADMISSION:  11/24/2005  DATE OF DISCHARGE:  12/31/2006LH                                 DISCHARGE SUMMARY   DISCHARGE DIAGNOSES:  1.  Acute bronchitis.  2.  New-onset atrial fibrillation.  3.  Hypokalemia.  4.  Diabetes.  5.  Hypertension.  6.  Hypothyroidism.  7.  Hypercholesterolemia.   HOSPITAL COURSE:  This patient is a 77 year old female who presented with  cough, shortness of breath and discomfort in the chest.  She is felt to  likely have pneumonia.  Chest x-ray though revealed no infiltrate.  Her  white count was 10,000 with 68 segs and 21 lymphs.  Her ABG revealed a pH of  7.46, pCO2 40 and pO2 of 84.  She was hospitalized and treated with IV  Rocephin, Zithromax and albuterol.  She had bilateral rhonchi initially on  her exam.   She developed new onset atrial fibrillation.  She was treated with IV  heparin and oral Coumadin.  Her heart rate was controlled with oral  diltiazem.  She was euthyroid on Synthroid 100 with a TSH of 0.958.  Her  echocardiogram revealed moderate left atrial enlargement and mild mitral  regurgitation.  Ejection fraction was in the low normal range at 50%.  Her  BNP was 16.   Blood cultures were negative.  Her cough improved with therapy and rhonchi  resolved.   Her hypokalemia was treated with supplemental potassium.  Her potassium  improved from 3.2-3.7.   Cardiac enzymes were negative.   Heparin was monitored by pharmacy.   Coumadin was dosed at 7.5 mg a day.  Her INR rose to 2.6 on December 31.  She will have a repeat INR on January 2.   She was improved and stable for discharge on December 31.  She will be seen  in followup in the office in approximately 2 weeks.   DISCHARGE  MEDICATIONS:  1.  Coumadin 5 mg daily.  2.  Diltiazem ER 120 mg daily (her heart rate now is in the 60s).  3.  Ceftin 250 mg b.i.d. x5 more days.  4.  Synthroid 100 mcg daily.  5.  Welchol six a day.      Kingsley Callander. Ouida Sills, MD  Electronically Signed     ROF/MEDQ  D:  11/28/2005  T:  11/29/2005  Job:  [id number]

## 2011-04-16 NOTE — Group Therapy Note (Signed)
NAMELUISA, [name]                ACCOUNT NO.:  [id number]   MEDICAL RECORD NO.:  [id number]          PATIENT TYPE:  INP   LOCATION:  A211                          FACILITY:  [facility]   PHYSICIAN:  Edward L. Juanetta Gosling, M.D.DATE OF BIRTH:  January 23, 1930   DATE OF PROCEDURE:  11/24/2005  DATE OF DISCHARGE:                                   PROGRESS NOTE   Patient of Dr. Ouida Sills. [name] was admitted today and has been found to  be in atrial flutter/fibrillation. She says that in the past she has had  some problems with a heart murmur that she said only shows up when she is  sick. It is not clear that she has ever had anything like an atrial  flutter/fibrillation before but her husband then says that she had something  when she was hospitalized in the past that Dr. Ouida Sills had worked up but this  was done apparently before [employer]. At any rate she looks comfortable, she  is coughing and wheezing but considering her atrial flutter/fibrillation I  am going to go ahead and move her to the second floor so she can be  monitored, have her started on heparin and get an echocardiogram, I am not  going to start her on Coumadin yet and Dr. Ouida Sills will resume her care in the  morning and may well order cardiology consultation. She has already had lab  work done so I have asked for an additional testing on it to check a BNP and  also to check a TSH. She is on thyroid replacement.      Edward L. Juanetta Gosling, M.D.  Electronically Signed     ELH/MEDQ  D:  11/24/2005  T:  11/24/2005  Job:  [id number]

## 2011-04-16 NOTE — Discharge Summary (Signed)
[name], [name]                ACCOUNT NO.:  [id number]   MEDICAL RECORD NO.:  [id number]          PATIENT TYPE:  INP   LOCATION:  3736                         FACILITY:  [facility]   PHYSICIAN:  Dani Gobble, MD       DATE OF BIRTH:  January 20, 1930   DATE OF ADMISSION:  04/23/2008  DATE OF DISCHARGE:  04/25/2008                               DISCHARGE SUMMARY   DISCHARGE DIAGNOSES:  1. Subendocardial myocardial infarction with troponin of 0.1 this      admission, plan is for medical therapy.  2. Known coronary disease with drug-eluding stent to the circumflex,      February 2009, complicated by retroperitoneal bleed.  3. Negative Myoview, February 2009, after a percutaneous coronary      intervention.  4. Chronic atrial fibrillation.  5. Treated dyslipidemia.  6. Treated hypothyroidism.   HOSPITAL COURSE:  The patient is a 77 year old female with history of  recent DES stenting to the circumflex.  She had single vessel disease.  She presented then with abdominal pain.  She had a Liberte stent.  Her  LV function was good.  She did have a retroperitoneal bleed after PCI.  She is on chronic Coumadin for chronic atrial fibrillation.  She  presented on Apr 24, 2008, with chest pain.  She described a vague  discomfort in her chest with some shortness of breath.  She was admitted  to the emergency room.  Her initial troponin was negative, but second  troponin was 0.1.  CK-MBs were negative.  INR was 2.0.  She was noted to  be anxious after admission.  Coumadin was held and she was put on IV  heparin.  After discussion with Dr. Alanda Amass, on Apr 24, 2008, the  patient decided that she did not want recatheterization.  Plan would be  for medical therapy.  We will consider another outpatient Myoview or  coronary CT angiogram for evaluation if she has recurrent chest pain.  We transferred the patient to the floor and ambulated her.  Her Coumadin  was resumed.  We feel that she could be discharged  on Apr 25, 2008.   DISCHARGE MEDICATIONS:  1. Metoprolol 12.5 mg b.i.d.  2. Doxycycline 100 mg twice a day.  3. Pravastatin 40 mg a day.  4. Coumadin as directed.  She takes 2.5 mg a day alternating with 1.25      mg every other day.  5. She is on levothyroxine 0.1 mg a day.  6. Nitroglycerin sublingual p.r.n.  7. Two baby aspirins daily.  8. Prilosec OTC, or Zantac 150 daily.   LABORATORY DATA:  EKG shows atrial fibrillation with controlled  ventricular response, she did have some pauses and slow rates and we  stopped her digoxin and cut back her beta-blocker.   White count 8.6, hemoglobin 14.6, hematocrit 42.7, and platelets 198.  INR is 2.0 on admission 2.4 at discharge.  Sodium 137, potassium 3.7,  BUN 18 and creatinine 0.76.  Liver functions are normal.  Hemoglobin A1c  is 6.4.  CK-MBs are negative.  LDL is 96, HDL 52, TSH  1.02, and troponin  peaked at 0.1.   DISPOSITION:  The patient is discharged in stable condition and will  follow up with Dr. Domingo Sep next week.      Abelino Derrick, P.A.    ______________________________  Dani Gobble, MD    LKK/MEDQ  D:  05/21/2008  T:  05/21/2008  Job:  [id number]   cc:   Dani Gobble, MD  Rhoderick Moody

## 2011-04-16 NOTE — Group Therapy Note (Signed)
NAMEKHADY, VANDENBERG                ACCOUNT NO.:  [id number]   MEDICAL RECORD NO.:  [id number]          PATIENT TYPE:  INP   LOCATION:  A211                          FACILITY:  [facility]   PHYSICIAN:  Mila Homer. Sudie Bailey, M.D.DATE OF BIRTH:  07-25-1930   DATE OF PROCEDURE:  11/27/2005  DATE OF DISCHARGE:                                   PROGRESS NOTE   SUBJECTIVE:  Patient feels very weak still.  Being hospitalized takes her  back to when her 75 year old grandson was in a severe motorcycle accident 2  years ago.  He ended up with massive brain drainage.   OBJECTIVE:  She is talkative, alert and oriented, well developed, well  nourished.  Temperature 96.4, pulse 68, respiratory rate 20, blood pressure  105/59.  Heart has irregular irregularity.  The lungs show decreased breath  sounds throughout.  There is no edema of the ankles.  The abdomen was soft.  Glucose has been 144 and 89 and O2 saturation is 97% on room air.   Review of the monitor shows she is in atrial fibrillation.   ASSESSMENT:  1.  Presumptive pneumonia.  2.  Atrial fibrillation.   PLAN:  Continue with heparin, Coumadin and antibiotics.  Followup by her LMD  Dr. Ouida Sills tomorrow.  Discussed the reasons she should be on anticoagulation  with atrial fibrillation.      Mila Homer. Sudie Bailey, M.D.  Electronically Signed     SDK/MEDQ  D:  11/27/2005  T:  11/28/2005  Job:  [id number]

## 2011-04-16 NOTE — Procedures (Signed)
NAMELOVINIA, SNARE                ACCOUNT NO.:  [id number]   MEDICAL RECORD NO.:  [id number]          PATIENT TYPE:  INP   LOCATION:  A211                          FACILITY:  [facility]   PHYSICIAN:  Edward L. Juanetta Gosling, M.D.DATE OF BIRTH:  January 25, 1930   DATE OF PROCEDURE:  11/24/2005  DATE OF DISCHARGE:                                EKG INTERPRETATION   TIME:  1532 hours on November 24, 2005   RESULTS:  The rhythm appears to be an atrial flutter/fibrillation.  The  ventricular response is between 100-110.  The axis is rightward, but does  not meet criteria for right axis deviation.  There is an incomplete right  bundle branch block.  Nonspecific ST and T wave abnormalities are seen  diffusely.   IMPRESSION:  Abnormal electrocardiogram.      Edward L. Juanetta Gosling, M.D.  Electronically Signed     ELH/MEDQ  D:  11/26/2005  T:  11/26/2005  Job:  [id number]

## 2011-04-16 NOTE — Procedures (Signed)
[name], [name]                ACCOUNT NO.:  [id number]   MEDICAL RECORD NO.:  [id number]          PATIENT TYPE:  INP   LOCATION:  A211                          FACILITY:  [facility]   PHYSICIAN:  Maisie Fus C. Wall, M.D.   DATE OF BIRTH:  September 20, 1930   DATE OF PROCEDURE:  11/26/2005  DATE OF DISCHARGE:                                  ECHOCARDIOGRAM   INDICATIONS FOR PROCEDURE:  Atrial fibrillation. (427.31)   The echocardiogram is of adequate quality.   CONCLUSION:  1.  Moderate left atrial enlargement.  2.  Normal left ventricular chamber size and overall systolic function.      Lower range of normal at around 50%.  3.  No evidence of left ventricular hypertrophy.  4.  Mitral valve thickening with mild mitral regurgitation.  5.  Normal aortic valve.  6.  Mild right atrial enlargement.  7.  Moderate tricuspid regurgitation with an estimated right ventricular      pressure of around 25 mmHg.  8.  Normal right ventricular function.  9.  No pericardial effusion.      Thomas C. Wall, M.D.  Electronically Signed     TCW/MEDQ  D:  11/26/2005  T:  11/27/2005  Job:  [id number]

## 2011-04-16 NOTE — H&P (Signed)
NAMEJOSEPHINA, MELCHER                ACCOUNT NO.:  [id number]   MEDICAL RECORD NO.:  [id number]          PATIENT TYPE:  INP   LOCATION:  A312                          FACILITY:  [facility]   PHYSICIAN:  Kingsley Callander. Ouida Sills, MD       DATE OF BIRTH:  1930-01-19   DATE OF ADMISSION:  11/24/2005  DATE OF DISCHARGE:  LH                                HISTORY & PHYSICAL   CHIEF COMPLAINT:  Cough.   HISTORY OF PRESENT ILLNESS:  This patient is a 77 year old white female who  presented with a five-day history of cough, shortness of breath, and  discomfort in the chest. She has coughed up yellow mucus. She has had  difficulty clearing her secretions. She complains of wheezing and tightness  in the chest. She has a flu vaccine in November. She does not have a history  of asthma. She does not smoke. On evaluation in the office, she coughed  repeatedly and was very uncomfortable. She has bilateral rhonchi on her lung  exam and was felt to likely have pneumonia. She has had body aches and a  great deal of generalized weakness. She is therefore being hospitalized for  treatment with IV antibiotic therapy.   PAST MEDICAL HISTORY:  1.  Diabetes.  2.  Hypertension.  3.  Hypercholesterolemia.  4.  Lumbar disk disease.  5.  Polio.  6.  Appendectomy.  7.  Tonsillectomy.  8.  Hysterectomy.  9.  Hypothyroidism.   MEDICATIONS:  1.  Lorazepam 1 mg 1/2 to 1 t.i.d. p.r.n.  2.  Chlorthalidone 25 mg daily.   ALLERGIES:  PENICILLIN and SULFA.   SOCIAL HISTORY:  She does not smoke or drink. She has had a great deal of  stress from the illnesses of multiple family members.   FAMILY HISTORY:  Her mother had cervical cancer. Her father had diabetes.   REVIEW OF SYSTEMS:  Noncontributory.   PHYSICAL EXAMINATION:  VITAL SIGNS:  Temperature 97.3, blood pressure  124/82, pulse 112, respirations 20.  GENERAL:  Uncomfortable appearing but alert and oriented female.  HEENT:  Eyes, nose, TMs and oropharynx unremarkable.  NECK:  Supple with no thyromegaly.  LUNGS:  Bilateral rhonchi.  HEART:  Tachycardic with no murmurs.  ABDOMEN:  Nontender. No hepatosplenomegaly.  EXTREMITIES:  No clubbing, cyanosis, or edema.  NEUROLOGICAL:  Grossly intact.   LABORATORY DATA:  Pending.   IMPRESSION:  1.  Probable pneumonia. Will obtain a chest x-ray, CBC, blood cultures and      sputum culture. Will treat with inhaled bronchodilators and IV Rocephin      and IV Zithromax.  2.  Diabetes. Will follow Accu-Cheks and treat with short-acting insulin if      needed.  3.  Hypertension.      Kingsley Callander. Ouida Sills, MD  Electronically Signed     ROF/MEDQ  D:  11/24/2005  T:  11/24/2005  Job:  [id number]

## 2011-08-20 LAB — CROSSMATCH

## 2011-08-20 LAB — BASIC METABOLIC PANEL
BUN: 17
BUN: 10
BUN: 12
BUN: 13
BUN: 5 — ABNORMAL LOW
BUN: 6
CO2: 32
CO2: 31
CO2: 28
CO2: 30
Calcium: 8 — ABNORMAL LOW
Calcium: 8.9
Calcium: 8.5
Calcium: 8.7
Calcium: 8.7
Calcium: 8.8
Chloride: 99
Chloride: 103
Chloride: 97
Creatinine, Ser: 0.65
Creatinine, Ser: 0.65
Creatinine, Ser: 0.54
Creatinine, Ser: 0.56
Creatinine, Ser: 0.56
Creatinine, Ser: 0.57
Creatinine, Ser: 0.6
GFR calc Af Amer: >60
GFR calc Af Amer: >60
GFR calc Af Amer: >60
GFR calc Af Amer: >60
GFR calc non Af Amer: >60
GFR calc non Af Amer: >60
GFR calc non Af Amer: >60
GFR calc non Af Amer: >60
GFR calc non Af Amer: >60
Glucose, Bld: 105 — ABNORMAL HIGH
Glucose, Bld: 107 — ABNORMAL HIGH
Glucose, Bld: 108 — ABNORMAL HIGH
Glucose, Bld: 111 — ABNORMAL HIGH
Glucose, Bld: 116 — ABNORMAL HIGH
Potassium: 3.5
Potassium: 2.9 — ABNORMAL LOW
Potassium: 3.2 — ABNORMAL LOW
Potassium: 3.6
Potassium: 3.9
Sodium: 142
Sodium: 142
Sodium: 139
Sodium: 139

## 2011-08-20 LAB — CBC
HCT: 45
HCT: 25.6 — ABNORMAL LOW
HCT: 28.9 — ABNORMAL LOW
HCT: 32 — ABNORMAL LOW
HCT: 32.7 — ABNORMAL LOW
HCT: 35.5 — ABNORMAL LOW
HCT: 41.9
HCT: 44
HCT: 33.2 — ABNORMAL LOW
Hemoglobin: 10.7 — ABNORMAL LOW
Hemoglobin: 10.8 — ABNORMAL LOW
Hemoglobin: 11.7 — ABNORMAL LOW
Hemoglobin: 14
Hemoglobin: 8.5 — ABNORMAL LOW
Hemoglobin: 8.8 — ABNORMAL LOW
Hemoglobin: 9.7 — ABNORMAL LOW
Hemoglobin: 10.9 — ABNORMAL LOW
Hemoglobin: 10.9 — ABNORMAL LOW
MCHC: 33.5
MCHC: 33
MCHC: 33.5
MCHC: 33.6
MCHC: 33.7
MCHC: 33.3
MCHC: 33.5
MCHC: 33.7
MCHC: 33.8
MCV: 85.5
MCV: 86.4
MCV: 86.5
MCV: 86.5
MCV: 86.6
MCV: 86.9
MCV: 87.2
MCV: 87.3
MCV: 87.6
Platelets: 243
Platelets: 179
Platelets: 196
Platelets: 208
Platelets: 216
Platelets: 221
Platelets: 181
Platelets: 194
RBC: 5.27 — ABNORMAL HIGH
RBC: 2.95 — ABNORMAL LOW
RBC: 3.7 — ABNORMAL LOW
RBC: 4.11
RBC: 3.64 — ABNORMAL LOW
RBC: 3.72 — ABNORMAL LOW
RDW: 14
RDW: 14.1
RDW: 14.2
RDW: 14.4
RDW: 14.4
RDW: 14.4
RDW: 14.4
WBC: 9
WBC: 12.5 — ABNORMAL HIGH
WBC: 8.4
WBC: 8.6

## 2011-08-20 LAB — POCT CARDIAC MARKERS
CKMB, poc: 3
Operator id: 218581
Troponin i, poc: 0.12 — ABNORMAL HIGH
Troponin i, poc: <0.05

## 2011-08-20 LAB — CARDIAC PANEL(CRET KIN+CKTOT+MB+TROPI)
CK, MB: 7.2 — ABNORMAL HIGH
CK, MB: 9.1 — ABNORMAL HIGH
CK, MB: 4.1 — ABNORMAL HIGH
CK, MB: 4.4 — ABNORMAL HIGH
Relative Index: 7 — ABNORMAL HIGH
Relative Index: 3.5 — ABNORMAL HIGH
Relative Index: 3.9 — ABNORMAL HIGH
Total CK: 104
Total CK: 116
Troponin I: 0.3 — ABNORMAL HIGH
Troponin I: 0.33 — ABNORMAL HIGH
Troponin I: 0.34 — ABNORMAL HIGH

## 2011-08-20 LAB — PREPARE FRESH FROZEN PLASMA

## 2011-08-20 LAB — CK TOTAL AND CKMB (NOT AT ARMC)
CK, MB: 3.4
Total CK: 120

## 2011-08-20 LAB — DIFFERENTIAL
Basophils Relative: 0
Basophils Relative: 0
Eosinophils Absolute: 0.1
Eosinophils Absolute: 0.2
Eosinophils Absolute: 0.3
Eosinophils Relative: 1
Eosinophils Relative: 2
Eosinophils Relative: 2
Lymphs Abs: 1.9
Monocytes Absolute: 0.9
Monocytes Relative: 9
Monocytes Relative: 8
Neutrophils Relative %: 68

## 2011-08-20 LAB — I-STAT 8, (EC8 V) (CONVERTED LAB)
Acid-Base Excess: 3 — ABNORMAL HIGH
Bicarbonate: 29.9 — ABNORMAL HIGH
Glucose, Bld: 94
Hemoglobin: 15.3 — ABNORMAL HIGH
Operator id: 146091
Potassium: 3.4 — ABNORMAL LOW
Sodium: 138
TCO2: 32

## 2011-08-20 LAB — AMYLASE: Amylase: 39

## 2011-08-20 LAB — LIPID PANEL
Cholesterol: 182
HDL: 50
Total CHOL/HDL Ratio: 3.6

## 2011-08-20 LAB — PROTIME-INR
INR: 2.7 — ABNORMAL HIGH
INR: 1.8 — ABNORMAL HIGH
INR: 1.1
INR: 1.1
Prothrombin Time: 29.6 — ABNORMAL HIGH
Prothrombin Time: 14.5

## 2011-08-20 LAB — URINALYSIS, ROUTINE W REFLEX MICROSCOPIC
Glucose, UA: NEGATIVE
Specific Gravity, Urine: <1.005 — ABNORMAL LOW
pH: 6

## 2011-08-20 LAB — HEPATIC FUNCTION PANEL
Albumin: 4.1
Total Protein: 7.4

## 2011-08-20 LAB — APTT: aPTT: 47 — ABNORMAL HIGH

## 2011-08-20 LAB — HEPARIN LEVEL (UNFRACTIONATED): Heparin Unfractionated: 0.51

## 2011-08-25 LAB — CK TOTAL AND CKMB (NOT AT ARMC)
Relative Index: INVALID
Total CK: 58
Total CK: 71

## 2011-08-25 LAB — PROTIME-INR
Prothrombin Time: 23.2 — ABNORMAL HIGH
Prothrombin Time: 24.6 — ABNORMAL HIGH

## 2011-08-25 LAB — POCT I-STAT, CHEM 8
BUN: 22
Calcium, Ion: 1.17
TCO2: 29

## 2011-08-25 LAB — DIFFERENTIAL
Basophils Absolute: 0.1
Basophils Relative: 1
Eosinophils Relative: 2
Lymphocytes Relative: 29
Monocytes Absolute: 0.8

## 2011-08-25 LAB — COMPREHENSIVE METABOLIC PANEL
Alkaline Phosphatase: 65
BUN: 18
Calcium: 9.3
Glucose, Bld: 103 — ABNORMAL HIGH
Potassium: 3.8
Total Protein: 6.3

## 2011-08-25 LAB — POCT CARDIAC MARKERS
Operator id: 295131
Troponin i, poc: 0.1 — ABNORMAL HIGH

## 2011-08-25 LAB — CBC
HCT: 42.7
HCT: 44.6
Hemoglobin: 15.1 — ABNORMAL HIGH
MCHC: 33.8
MCHC: 34.1
MCV: 86.6
Platelets: 198
RDW: 14.6
RDW: 14.8

## 2011-08-25 LAB — TSH: TSH: 1.026

## 2011-08-25 LAB — HEMOGLOBIN A1C
Hgb A1c MFr Bld: 6.4 — ABNORMAL HIGH
Mean Plasma Glucose: 151

## 2011-08-25 LAB — BASIC METABOLIC PANEL
BUN: 18
CO2: 27
Chloride: 103
GFR calc non Af Amer: >60
Glucose, Bld: 91
Potassium: 3.7

## 2011-08-25 LAB — LIPID PANEL: Triglycerides: 133

## 2011-08-25 LAB — MAGNESIUM: Magnesium: 2.1

## 2011-08-26 LAB — POCT CARDIAC MARKERS
CKMB, poc: 1.1
CKMB, poc: 1.6
Myoglobin, poc: 40.4

## 2011-08-26 LAB — DIFFERENTIAL
Basophils Absolute: 0
Basophils Relative: 0
Eosinophils Absolute: 0.3
Monocytes Absolute: 0.7
Neutro Abs: 4.6
Neutrophils Relative %: 56

## 2011-08-26 LAB — BASIC METABOLIC PANEL
BUN: 21
CO2: 29
Calcium: 9.3
Creatinine, Ser: 0.64
Glucose, Bld: 102 — ABNORMAL HIGH

## 2011-08-26 LAB — CBC
MCHC: 34.3
MCV: 85.8
RDW: 14.6

## 2011-08-27 ENCOUNTER — Other Ambulatory Visit: Payer: Self-pay | Admitting: Internal Medicine

## 2011-08-27 DIAGNOSIS — Z1231 Encounter for screening mammogram for malignant neoplasm of breast: Secondary | ICD-10-CM

## 2011-08-30 DIAGNOSIS — Z9289 Personal history of other medical treatment: Secondary | ICD-10-CM

## 2011-08-30 HISTORY — DX: Personal history of other medical treatment: Z92.89

## 2011-09-07 ENCOUNTER — Ambulatory Visit
Admission: RE | Admit: 2011-09-07 | Discharge: 2011-09-07 | Disposition: A | Discharge status: Home / Self Care | Payer: Medicare Other | Source: Ambulatory Visit | Attending: Internal Medicine | Admitting: Internal Medicine

## 2011-09-07 DIAGNOSIS — Z1231 Encounter for screening mammogram for malignant neoplasm of breast: Secondary | ICD-10-CM

## 2012-03-18 ENCOUNTER — Encounter (HOSPITAL_COMMUNITY): Payer: Self-pay | Admitting: *Deleted

## 2012-03-18 ENCOUNTER — Emergency Department (HOSPITAL_COMMUNITY): Payer: Medicare Other

## 2012-03-18 ENCOUNTER — Emergency Department (HOSPITAL_COMMUNITY)
Admission: EM | Admit: 2012-03-18 | Discharge: 2012-03-18 | Disposition: A | Discharge status: Home / Self Care | Payer: Medicare Other | Attending: Emergency Medicine | Admitting: Emergency Medicine

## 2012-03-18 DIAGNOSIS — I4891 Unspecified atrial fibrillation: Secondary | ICD-10-CM | POA: Insufficient documentation

## 2012-03-18 DIAGNOSIS — Z8673 Personal history of transient ischemic attack (TIA), and cerebral infarction without residual deficits: Secondary | ICD-10-CM | POA: Insufficient documentation

## 2012-03-18 DIAGNOSIS — I498 Other specified cardiac arrhythmias: Secondary | ICD-10-CM | POA: Insufficient documentation

## 2012-03-18 DIAGNOSIS — Z950 Presence of cardiac pacemaker: Secondary | ICD-10-CM | POA: Insufficient documentation

## 2012-03-18 DIAGNOSIS — E119 Type 2 diabetes mellitus without complications: Secondary | ICD-10-CM | POA: Insufficient documentation

## 2012-03-18 DIAGNOSIS — K5289 Other specified noninfective gastroenteritis and colitis: Secondary | ICD-10-CM | POA: Insufficient documentation

## 2012-03-18 DIAGNOSIS — I2510 Atherosclerotic heart disease of native coronary artery without angina pectoris: Secondary | ICD-10-CM | POA: Insufficient documentation

## 2012-03-18 DIAGNOSIS — M129 Arthropathy, unspecified: Secondary | ICD-10-CM | POA: Insufficient documentation

## 2012-03-18 DIAGNOSIS — R1084 Generalized abdominal pain: Secondary | ICD-10-CM | POA: Insufficient documentation

## 2012-03-18 DIAGNOSIS — E860 Dehydration: Secondary | ICD-10-CM | POA: Insufficient documentation

## 2012-03-18 DIAGNOSIS — K529 Noninfective gastroenteritis and colitis, unspecified: Secondary | ICD-10-CM

## 2012-03-18 DIAGNOSIS — R197 Diarrhea, unspecified: Secondary | ICD-10-CM | POA: Insufficient documentation

## 2012-03-18 HISTORY — DX: Presence of cardiac pacemaker: Z95.0

## 2012-03-18 HISTORY — DX: Unspecified osteoarthritis, unspecified site: M19.90

## 2012-03-18 HISTORY — DX: Atherosclerotic heart disease of native coronary artery without angina pectoris: I25.10

## 2012-03-18 HISTORY — DX: Unspecified atrial fibrillation: I48.91

## 2012-03-18 HISTORY — DX: Cerebral infarction, unspecified: I63.9

## 2012-03-18 LAB — BASIC METABOLIC PANEL
BUN: 20 mg/dL (ref 6–23)
Chloride: 100 mEq/L (ref 96–112)
Creatinine, Ser: 0.55 mg/dL (ref 0.50–1.10)
GFR calc Af Amer: >90 mL/min (ref >90)
Glucose, Bld: 91 mg/dL (ref 70–99)
Potassium: 3.8 mEq/L (ref 3.5–5.1)

## 2012-03-18 LAB — URINE MICROSCOPIC-ADD ON

## 2012-03-18 LAB — CBC
Hemoglobin: 13.7 g/dL (ref 12.0–15.0)
MCH: 28 pg (ref 26.0–34.0)
Platelets: 237 10*3/uL (ref 150–400)
RBC: 4.89 MIL/uL (ref 3.87–5.11)
WBC: 8.4 10*3/uL (ref 4.0–10.5)

## 2012-03-18 LAB — URINALYSIS, ROUTINE W REFLEX MICROSCOPIC
Bilirubin Urine: NEGATIVE
Glucose, UA: NEGATIVE mg/dL
Ketones, ur: NEGATIVE mg/dL
Nitrite: NEGATIVE
Specific Gravity, Urine: >1.03 — ABNORMAL HIGH (ref 1.005–1.030)
pH: 5.5 (ref 5.0–8.0)

## 2012-03-18 LAB — DIFFERENTIAL
Eosinophils Absolute: 0.4 10*3/uL (ref 0.0–0.7)
Lymphocytes Relative: 35 % (ref 12–46)
Lymphs Abs: 2.9 10*3/uL (ref 0.7–4.0)
Monocytes Relative: 9 % (ref 3–12)
Neutrophils Relative %: 51 % (ref 43–77)

## 2012-03-18 MED ORDER — ONDANSETRON HCL 4 MG/2ML IJ SOLN
4.0000 mg | Freq: Once | INTRAMUSCULAR | Status: AC
  Administered 2012-03-18: 4 mg via INTRAVENOUS
  Filled 2012-03-18: qty 2

## 2012-03-18 MED ORDER — ONDANSETRON 4 MG PO TBDP: ORAL_TABLET | ORAL | Status: DC

## 2012-03-18 MED ORDER — HYOSCYAMINE SULFATE CR 0.375 MG PO CP12: 0.3750 mg | ORAL_CAPSULE | Freq: Two times a day (BID) | ORAL | Status: DC | PRN

## 2012-03-18 MED ORDER — ONDANSETRON HCL 4 MG/2ML IJ SOLN
INTRAMUSCULAR | Status: AC
  Filled 2012-03-18: qty 2

## 2012-03-18 MED ORDER — ONDANSETRON HCL 4 MG/2ML IJ SOLN
4.0000 mg | Freq: Once | INTRAMUSCULAR | Status: AC
  Administered 2012-03-18: 4 mg via INTRAVENOUS

## 2012-03-18 MED ORDER — SODIUM CHLORIDE 0.9 % IV BOLUS (SEPSIS)
500.0000 mL | Freq: Once | INTRAVENOUS | Status: AC
  Administered 2012-03-18: 500 mL via INTRAVENOUS

## 2012-03-18 NOTE — ED Notes (Addendum)
Pt reports diarrhea that started Monday. Pt now having abdominal pain that started today. Denies having any nausea or vomiting or fever.

## 2012-03-18 NOTE — ED Notes (Signed)
Pt reporting she is nauseated after drinking the contrast. EDP notified

## 2012-03-18 NOTE — Discharge Instructions (Signed)
Follow up with dr. Sudie Bailey this week for recheck

## 2012-03-18 NOTE — ED Notes (Signed)
Pt alert & oriented x4, stable gait. Pt given discharge instructions, paperwork & prescription(s). Patient instructed to stop at the registration desk to finish any additional paperwork. pt verbalized understanding. Pt left department w/ no further questions.  

## 2012-03-18 NOTE — ED Provider Notes (Signed)
History   This chart was scribed for Benny Lennert, MD by Melba Coon. The patient was seen in room APA19/AP[facility]19 and the patient's care was started at 8:11PM.    CSN: [id number]  Arrival date & time 03/18/12  1949   First MD Initiated Contact with Patient 03/18/12 2007      Chief Complaint  Patient presents with  . Abdominal Pain  . Diarrhea    (Consider location/radiation/quality/duration/timing/severity/associated sxs/prior treatment) Patient is a 78 y.o. female presenting with abdominal pain and diarrhea. The history is provided by the patient.  Abdominal Pain The primary symptoms of the illness include abdominal pain and diarrhea. The primary symptoms of the illness do not include nausea or vomiting. Episode onset: 5 days ago. The onset of the illness was sudden. The problem has been gradually worsening.  The abdominal pain is generalized. The abdominal pain does not radiate. The severity of the abdominal pain is 5/10. The abdominal pain is relieved by nothing. The abdominal pain is exacerbated by bowel movements.  The diarrhea is mucous (yellow).  The patient states that she believes she is currently not pregnant.  Diarrhea The primary symptoms include abdominal pain and diarrhea. Primary symptoms do not include nausea or vomiting.  Pt pain has progressed to the point where decreased sleep is present. Pt feels like she "lifted something heavy". No diarrhea today. Started new medications 2 weeks ago and last week (for heart and vascular problems). Hx of hysterectomy. Has a pacemaker. Allergic to Iohexol. No other pertinent medical symptoms.  No PCP  Past Medical History  Diagnosis Date  . Coronary artery disease   . A-fib   . Stroke   . Pacemaker   . Arthritis   . Diabetes mellitus     Past Surgical History  Procedure Date  . Cardiac surgery   . 3 cardiac stents   . Abdominal hysterectomy   . Tonsillectomy   . Appendectomy     No family history on  file.  History  Substance Use Topics  . Smoking status: Never Smoker   . Smokeless tobacco: Not on file  . Alcohol Use: No    OB History    Grav Para Term Preterm Abortions TAB SAB Ect Mult Living                  Review of Systems  Constitutional: Negative.   HENT: Negative.   Eyes: Negative.   Respiratory: Negative.   Cardiovascular: Negative.   Gastrointestinal: Positive for abdominal pain and diarrhea. Negative for nausea and vomiting.  Genitourinary: Negative.   Musculoskeletal: Negative.   Neurological: Negative.   Hematological: Negative.   Psychiatric/Behavioral: Negative.   All other systems reviewed and are negative.   10 Systems reviewed and all are negative for acute change except as noted in the HPI.   Allergies  Iohexol  Home Medications  No current outpatient prescriptions on file.  BP 140/77  Pulse 80  Temp(Src) 97.3 F (36.3 C) (Oral)  Resp 20  Ht 5\' 9"  (1.753 m)  Wt 187 lb (84.823 kg)  BMI 27.62 kg/m2  SpO2 99%  Physical Exam  Nursing note and vitals reviewed. Constitutional: She is oriented to person, place, and time. She appears well-developed and well-nourished.       Awake, alert, nontoxic appearance.  HENT:  Head: Normocephalic and atraumatic.  Eyes: EOM are normal. Pupils are equal, round, and reactive to light. Right eye exhibits no discharge. Left eye exhibits no discharge.  Neck: Normal  range of motion. Neck supple.  Cardiovascular: Normal rate.  An irregular rhythm present.  No murmur heard. Pulmonary/Chest: Effort normal. She exhibits no tenderness.  Abdominal: Soft. There is tenderness (diffuse throughout). There is no rebound.  Musculoskeletal: She exhibits no edema and no tenderness.       Baseline ROM, no obvious new focal weakness.  Neurological: She is alert and oriented to person, place, and time.       Mental status and motor strength appears baseline for patient and situation.  Skin: Skin is warm. No rash noted.   Psychiatric: She has a normal mood and affect. Her behavior is normal.    ED Course  Procedures (including critical care time)  DIAGNOSTIC STUDIES: Oxygen Saturation is 99% on room air, normal by my interpretation.    COORDINATION OF CARE:  8:16PM - EDMD will order abd CT, blood w/u and UA for the pt.   Labs Reviewed  BASIC METABOLIC PANEL - Abnormal; Notable for the following:    GFR calc non Af Amer 87 (*)    All other components within normal limits  URINALYSIS, ROUTINE W REFLEX MICROSCOPIC - Abnormal; Notable for the following:    APPearance CLOUDY (*)    Specific Gravity, Urine >1.030 (*)    Hgb urine dipstick TRACE (*)    All other components within normal limits  URINE MICROSCOPIC-ADD ON - Abnormal; Notable for the following:    Squamous Epithelial / LPF FEW (*)    Bacteria, UA FEW (*)    All other components within normal limits  CBC  DIFFERENTIAL   Ct Abdomen Pelvis Wo Contrast  03/18/2012  *RADIOLOGY REPORT*  Clinical Data: Difficulty swallowing.  Epigastric abdominal pain. Nausea.  No contrast material given intravenously due to contrast allergy.  CT ABDOMEN AND PELVIS WITHOUT CONTRAST  Technique:  Multidetector CT imaging of the abdomen and pelvis was performed following the standard protocol without intravenous contrast.  Comparison: 06/14/2010  Findings: Slight fibrosis in the lung bases.  3 mm nodule in the right lung base appears stable since previous study.  Pectus active bottom.  Mild cardiac enlargement.  The small esophageal hiatal hernia.  The unenhanced appearance of the liver, spleen, gallbladder, pancreas, adrenal glands, kidneys, and retroperitoneal lymph nodes is unremarkable.  Calcification of the abdominal aorta without aneurysm.  The stomach is decompressed without apparent wall thickening.  No small bowel dilatation.  Stool filled colon without distension or inflammatory change.  No free air or free fluid in the abdomen.  Minimal fat in the umbilicus.   Circumscribed cyst or fluid collection in the right abdomen/pericolic gutter inferior to the liver edge.  This measures about seven by 5.5 cm diameter. This could represent lymphangioma or duplication cyst.  It is stable since previous study.  Pelvis:  The bladder wall is not thickened.  Adnexal structures are not enlarged.  Surgical absence of the uterus.  No free or loculated pelvic fluid collections.  Diverticulosis of the sigmoid colon without inflammatory process.  Appendix is surgically absent. Degenerative changes in the lumbar spine with prominent Schmorl's node at L5.  IMPRESSION: No acute process demonstrated in the abdomen or pelvis.  Benign- appearing cystic structure in the right abdomen, stable since previous study.  Diverticulosis without inflammatory process. Small esophageal hiatal hernia.  Original Report Authenticated By: Marlon Pel, M.D.     No diagnosis found.    MDM    The chart was scribed for me under my direct supervision.  I personally performed  the history, physical, and medical decision making and all procedures in the evaluation of this patient.Benny Lennert, MD 03/18/12 [phone]

## 2012-03-18 NOTE — ED Notes (Signed)
Pt states has has diarrhea " most of week", and presents with abdominal pain. Denies fever, n/v. Called PCP and was referred to Ed. Cardiac medication changed this week to control A-fib.

## 2012-04-25 ENCOUNTER — Ambulatory Visit (INDEPENDENT_AMBULATORY_CARE_PROVIDER_SITE_OTHER): Payer: Medicare Other | Admitting: Urology

## 2012-04-25 DIAGNOSIS — Z87440 Personal history of urinary (tract) infections: Secondary | ICD-10-CM

## 2012-04-25 DIAGNOSIS — N813 Complete uterovaginal prolapse: Secondary | ICD-10-CM

## 2012-04-25 DIAGNOSIS — N952 Postmenopausal atrophic vaginitis: Secondary | ICD-10-CM

## 2012-05-26 ENCOUNTER — Emergency Department (HOSPITAL_COMMUNITY)
Admission: EM | Admit: 2012-05-26 | Discharge: 2012-05-26 | Disposition: A | Discharge status: Home / Self Care | Payer: Medicare Other | Source: Home / Self Care | Attending: Emergency Medicine | Admitting: Emergency Medicine

## 2012-05-26 ENCOUNTER — Encounter (HOSPITAL_COMMUNITY): Payer: Self-pay

## 2012-05-26 DIAGNOSIS — R109 Unspecified abdominal pain: Secondary | ICD-10-CM

## 2012-05-26 HISTORY — DX: Disorder of thyroid, unspecified: E07.9

## 2012-05-26 LAB — POCT URINALYSIS DIP (DEVICE)
Bilirubin Urine: NEGATIVE
Glucose, UA: NEGATIVE mg/dL
Ketones, ur: NEGATIVE mg/dL
Leukocytes, UA: NEGATIVE
Nitrite: NEGATIVE

## 2012-05-26 NOTE — ED Notes (Signed)
C/o intermittent lower abdominal pain for several months.  States was recently treated for UTI when she saw her PCP for this abdominal pain.  States she had an episode of pain around 4 pm today.  Denies pain at present but states she knows it is there.  States she has been having a lot of gas and passing gas relieves the pain.  No n/v, diarrhea or constipation.  States she had a normal CT Scan of her abdomen in April for c/o same.  She has also been seeing an urologist for this pain.

## 2012-05-26 NOTE — Discharge Instructions (Signed)
Your abdominal pain is most likely gas pain.  Try using Gas-X for the next couple of days to see if that helps prevent it.  You can also try tummy massage.  If your pain returns and does not go away with passing gas or having a bowel movement, see Dr. Jillyn Hidden or go to the ER if it is after hours.

## 2012-05-27 NOTE — ED Provider Notes (Signed)
Medical screening examination/treatment/procedure(s) were performed by non-physician practitioner and as supervising physician I was immediately available for consultation/collaboration.  Leslee Home, M.D.   Reuben Likes, MD 05/27/12 1655

## 2012-05-27 NOTE — ED Provider Notes (Signed)
History     CSN: [id number]  Arrival date & time 05/26/12  4098   First MD Initiated Contact with Patient 05/26/12 2039      Chief Complaint  Patient presents with  . Abdominal Pain    (Consider location/radiation/quality/duration/timing/severity/associated sxs/prior treatment) HPI Comments: Pt has been having ongoing urinary problems, bladder prolapse, incomplete bladder emptying, frequent UTIs.  Today around 2pm, pt felt extremely sharp, sudden onset pain.  Pt went to bathroom, had bowel movement and passed gas and pain went away. No pain at this time. Pt is worried something is seriously wrong.  Reports having CT scan in April of abd that did not show anything concerning.    Patient is a 78 y.o. female presenting with abdominal pain. The history is provided by the patient.  Abdominal Pain The primary symptoms of the illness include abdominal pain. The primary symptoms of the illness do not include fever, nausea, vomiting, diarrhea, dysuria, vaginal discharge or vaginal bleeding. The current episode started 3 to 5 hours ago. The onset of the illness was sudden. The problem has been resolved.  The abdominal pain began 3 to 5 hours ago. The pain came on suddenly. The abdominal pain has been resolved since its onset. The abdominal pain is generalized. The abdominal pain does not radiate. The severity of the abdominal pain is 10/10. The abdominal pain is relieved by passing flatus and bowel movement.  The patient has not had a change in bowel habit. Risk factors for an acute abdominal problem include being elderly. Symptoms associated with the illness do not include chills, anorexia, diaphoresis, constipation, urgency, frequency or back pain.    Past Medical History  Diagnosis Date  . Coronary artery disease   . A-fib   . Stroke   . Pacemaker   . Arthritis   . Diabetes mellitus   . Thyroid disease     Past Surgical History  Procedure Date  . Cardiac surgery   . 3 cardiac stents     . Abdominal hysterectomy   . Tonsillectomy   . Appendectomy   . Pacemaker placement     No family history on file.  History  Substance Use Topics  . Smoking status: Never Smoker   . Smokeless tobacco: Not on file  . Alcohol Use: No    OB History    Grav Para Term Preterm Abortions TAB SAB Ect Mult Living                  Review of Systems  Constitutional: Negative for fever, chills, diaphoresis and appetite change.  Gastrointestinal: Positive for abdominal pain. Negative for nausea, vomiting, diarrhea, constipation, blood in stool, abdominal distention and anorexia.  Genitourinary: Negative for dysuria, urgency, frequency, vaginal bleeding and vaginal discharge.  Musculoskeletal: Negative for back pain.    Allergies  Iohexol  Home Medications   Current Outpatient Rx  Name Route Sig Dispense Refill  . ASPIRIN 81 MG PO TABS Oral Take 81 mg by mouth daily.    Marland Kitchen DIGOXIN 0.125 MG PO TABS Oral Take 125 mcg by mouth daily.    . ISOSORBIDE MONONITRATE ER 30 MG PO TB24 Oral Take 30 mg by mouth daily.    Marland Kitchen LEVOTHYROXINE SODIUM 100 MCG PO TABS Oral Take 100 mcg by mouth daily.    Marland Kitchen METOPROLOL SUCCINATE ER 100 MG PO TB24 Oral Take 100 mg by mouth daily. Take with or immediately following a meal.    . PANTOPRAZOLE SODIUM 40 MG PO TBEC Oral  Take 40 mg by mouth daily.    . WARFARIN SODIUM 5 MG PO TABS Oral Take 5 mg by mouth daily. As directed by dr.    . HYOSCYAMINE SULFATE ER 0.375 MG PO CP12 Oral Take 1 capsule (0.375 mg total) by mouth 2 (two) times daily as needed for cramping. 15 capsule 0  . ONDANSETRON 4 MG PO TBDP  1 po every 6-8 hours for nauseau 20 tablet 0    BP 145/89  Pulse 100  Temp 97.8 F (36.6 C) (Oral)  Resp 16  SpO2 99%  Physical Exam  Constitutional: She appears well-developed and well-nourished. No distress.  Neck: Normal range of motion. Neck supple.  Cardiovascular: Normal rate and regular rhythm.   Pulmonary/Chest: Effort normal and breath sounds  normal.  Abdominal: Soft. Bowel sounds are normal. She exhibits distension. She exhibits no mass. There is generalized tenderness. There is no rigidity, no rebound, no guarding and no CVA tenderness. No hernia.       Pt with generalized tenderness initially to palpation.  Distention is mild and likely gas-related.  During abd exam, I performed tummy massage.  This did not cause pt pain at all, and helped to reduce the distention.    Lymphadenopathy:    She has no cervical adenopathy.  Skin: Skin is warm, dry and intact. No rash noted.    ED Course  Procedures (including critical care time)   Labs Reviewed  POCT URINALYSIS DIP (DEVICE)  LAB REPORT - SCANNED   No results found.   1. Abdominal pain       MDM  Pt's description of pain and its location is most consistent with gas pain.  Reviewed with pt concerning sx to watch for and to f/u with pcp or ER if she develops them.          Cathlyn Parsons, NP 05/27/12 1224

## 2012-06-23 ENCOUNTER — Ambulatory Visit (INDEPENDENT_AMBULATORY_CARE_PROVIDER_SITE_OTHER): Payer: Medicare Other | Admitting: Urology

## 2012-06-23 DIAGNOSIS — Z87440 Personal history of urinary (tract) infections: Secondary | ICD-10-CM

## 2012-06-23 DIAGNOSIS — N8111 Cystocele, midline: Secondary | ICD-10-CM

## 2012-06-23 DIAGNOSIS — N952 Postmenopausal atrophic vaginitis: Secondary | ICD-10-CM

## 2012-08-17 ENCOUNTER — Other Ambulatory Visit: Payer: Self-pay | Admitting: Family Medicine

## 2012-08-17 DIAGNOSIS — Z1231 Encounter for screening mammogram for malignant neoplasm of breast: Secondary | ICD-10-CM

## 2012-09-07 ENCOUNTER — Ambulatory Visit: Payer: Medicare Other

## 2012-09-29 DIAGNOSIS — Z9289 Personal history of other medical treatment: Secondary | ICD-10-CM

## 2012-09-29 HISTORY — DX: Personal history of other medical treatment: Z92.89

## 2012-10-13 ENCOUNTER — Ambulatory Visit
Admission: RE | Admit: 2012-10-13 | Discharge: 2012-10-13 | Disposition: A | Discharge status: Home / Self Care | Payer: Medicare Other | Source: Ambulatory Visit | Attending: Family Medicine | Admitting: Family Medicine

## 2012-10-13 DIAGNOSIS — Z1231 Encounter for screening mammogram for malignant neoplasm of breast: Secondary | ICD-10-CM

## 2012-11-08 ENCOUNTER — Emergency Department (HOSPITAL_COMMUNITY): Payer: Medicare Other

## 2012-11-08 ENCOUNTER — Encounter (HOSPITAL_COMMUNITY): Payer: Self-pay | Admitting: *Deleted

## 2012-11-08 ENCOUNTER — Observation Stay (HOSPITAL_COMMUNITY)
Admission: EM | Admit: 2012-11-08 | Discharge: 2012-11-09 | Disposition: A | Discharge status: Home / Self Care | Payer: Medicare Other | Attending: Emergency Medicine | Admitting: Emergency Medicine

## 2012-11-08 DIAGNOSIS — K5732 Diverticulitis of large intestine without perforation or abscess without bleeding: Principal | ICD-10-CM | POA: Insufficient documentation

## 2012-11-08 DIAGNOSIS — Z8739 Personal history of other diseases of the musculoskeletal system and connective tissue: Secondary | ICD-10-CM | POA: Insufficient documentation

## 2012-11-08 DIAGNOSIS — Z7901 Long term (current) use of anticoagulants: Secondary | ICD-10-CM | POA: Insufficient documentation

## 2012-11-08 DIAGNOSIS — Z87440 Personal history of urinary (tract) infections: Secondary | ICD-10-CM | POA: Insufficient documentation

## 2012-11-08 DIAGNOSIS — E079 Disorder of thyroid, unspecified: Secondary | ICD-10-CM | POA: Insufficient documentation

## 2012-11-08 DIAGNOSIS — I252 Old myocardial infarction: Secondary | ICD-10-CM | POA: Insufficient documentation

## 2012-11-08 DIAGNOSIS — Z8673 Personal history of transient ischemic attack (TIA), and cerebral infarction without residual deficits: Secondary | ICD-10-CM | POA: Insufficient documentation

## 2012-11-08 DIAGNOSIS — F411 Generalized anxiety disorder: Secondary | ICD-10-CM | POA: Insufficient documentation

## 2012-11-08 DIAGNOSIS — Z950 Presence of cardiac pacemaker: Secondary | ICD-10-CM | POA: Insufficient documentation

## 2012-11-08 DIAGNOSIS — Z9861 Coronary angioplasty status: Secondary | ICD-10-CM | POA: Insufficient documentation

## 2012-11-08 DIAGNOSIS — Z90710 Acquired absence of both cervix and uterus: Secondary | ICD-10-CM | POA: Insufficient documentation

## 2012-11-08 DIAGNOSIS — I4891 Unspecified atrial fibrillation: Secondary | ICD-10-CM | POA: Insufficient documentation

## 2012-11-08 DIAGNOSIS — I2510 Atherosclerotic heart disease of native coronary artery without angina pectoris: Secondary | ICD-10-CM | POA: Insufficient documentation

## 2012-11-08 DIAGNOSIS — E119 Type 2 diabetes mellitus without complications: Secondary | ICD-10-CM | POA: Insufficient documentation

## 2012-11-08 DIAGNOSIS — R0789 Other chest pain: Secondary | ICD-10-CM | POA: Insufficient documentation

## 2012-11-08 DIAGNOSIS — N949 Unspecified condition associated with female genital organs and menstrual cycle: Secondary | ICD-10-CM | POA: Insufficient documentation

## 2012-11-08 DIAGNOSIS — Z79899 Other long term (current) drug therapy: Secondary | ICD-10-CM | POA: Insufficient documentation

## 2012-11-08 LAB — URINALYSIS, ROUTINE W REFLEX MICROSCOPIC
Bilirubin Urine: NEGATIVE
Hgb urine dipstick: NEGATIVE
Nitrite: NEGATIVE
Specific Gravity, Urine: 1.01 (ref 1.005–1.030)
Urobilinogen, UA: 0.2 mg/dL (ref 0.0–1.0)
pH: 5.5 (ref 5.0–8.0)

## 2012-11-08 LAB — CBC
HCT: 40.5 % (ref 36.0–46.0)
Hemoglobin: 13.4 g/dL (ref 12.0–15.0)
MCHC: 33.1 g/dL (ref 30.0–36.0)
RBC: 4.71 MIL/uL (ref 3.87–5.11)
WBC: 13.5 10*3/uL — ABNORMAL HIGH (ref 4.0–10.5)

## 2012-11-08 LAB — HEPATIC FUNCTION PANEL
AST: 14 U/L (ref 0–37)
Albumin: 3.8 g/dL (ref 3.5–5.2)
Total Protein: 6.5 g/dL (ref 6.0–8.3)

## 2012-11-08 LAB — BASIC METABOLIC PANEL
BUN: 20 mg/dL (ref 6–23)
Chloride: 101 mEq/L (ref 96–112)
GFR calc Af Amer: >90 mL/min (ref >90)
GFR calc non Af Amer: 80 mL/min — ABNORMAL LOW (ref >90)
Glucose, Bld: 112 mg/dL — ABNORMAL HIGH (ref 70–99)
Potassium: 4.2 mEq/L (ref 3.5–5.1)
Sodium: 140 mEq/L (ref 135–145)

## 2012-11-08 LAB — LIPASE, BLOOD: Lipase: 13 U/L (ref 11–59)

## 2012-11-08 LAB — POCT I-STAT TROPONIN I: Troponin i, poc: 0 ng/mL (ref 0.00–0.08)

## 2012-11-08 LAB — PROTIME-INR
INR: 2.23 — ABNORMAL HIGH (ref 0.00–1.49)
Prothrombin Time: 23.7 seconds — ABNORMAL HIGH (ref 11.6–15.2)

## 2012-11-08 LAB — DIGOXIN LEVEL: Digoxin Level: 0.7 ng/mL — ABNORMAL LOW (ref 0.8–2.0)

## 2012-11-08 NOTE — ED Provider Notes (Signed)
History     CSN: [id number]  Arrival date & time 11/08/12  [name]   First MD Initiated Contact with Patient 11/08/12 2007      Chief Complaint  Patient presents with  . Abdominal Pain  . Chest Pain    (Consider location/radiation/quality/duration/timing/severity/associated sxs/prior treatment) HPI Pt has been having lower abdominal pain x 1 day and states this is the pain she has when she get a UTI. No other urinary symptoms. Pt became anxious and states she felt pain in her upper abd that radiated into her chest. Discomfort only lasted few min and abated. Described as pressure. Pt states the pain was not the same as previous MI. Pt is now symptom-free.  Past Medical History  Diagnosis Date  . Coronary artery disease   . A-fib   . Stroke   . Pacemaker   . Arthritis   . Diabetes mellitus   . Thyroid disease     Past Surgical History  Procedure Date  . Cardiac surgery   . 3 cardiac stents   . Abdominal hysterectomy   . Tonsillectomy   . Appendectomy   . Pacemaker placement     No family history on file.  History  Substance Use Topics  . Smoking status: Never Smoker   . Smokeless tobacco: Not on file  . Alcohol Use: No    OB History    Grav Para Term Preterm Abortions TAB SAB Ect Mult Living                  Review of Systems  Constitutional: Negative for fever and chills.  HENT: Negative for neck pain.   Respiratory: Negative for cough, chest tightness, shortness of breath and wheezing.   Cardiovascular: Positive for chest pain. Negative for palpitations and leg swelling.  Gastrointestinal: Positive for abdominal pain. Negative for nausea, vomiting and diarrhea.  Genitourinary: Positive for pelvic pain. Negative for frequency and flank pain.  Musculoskeletal: Negative for back pain.  Skin: Negative for rash and wound.  Neurological: Negative for dizziness, tremors, syncope, weakness, light-headedness and headaches.    Allergies  Iohexol; Penicillins;  and Sulfur  Home Medications   Current Outpatient Rx  Name  Route  Sig  Dispense  Refill  . DIGOXIN 0.125 MG PO TABS   Oral   Take 125 mcg by mouth daily.         . ISOSORBIDE MONONITRATE ER 30 MG PO TB24   Oral   Take 30 mg by mouth daily.         Marland Kitchen LEVOTHYROXINE SODIUM 100 MCG PO TABS   Oral   Take 100 mcg by mouth daily.         Marland Kitchen METOPROLOL SUCCINATE ER 100 MG PO TB24   Oral   Take 150 mg by mouth daily. Take with or immediately following a meal.         . OMEPRAZOLE 40 MG PO CPDR   Oral   Take 40 mg by mouth daily.         Marland Kitchen PANTOPRAZOLE SODIUM 40 MG PO TBEC   Oral   Take 40 mg by mouth daily.         . WARFARIN SODIUM 5 MG PO TABS   Oral   Take 5 mg by mouth daily. As directed by dr.         . CIPROFLOXACIN HCL 500 MG PO TABS   Oral   Take 1 tablet (500 mg total) by mouth every 12 (  twelve) hours.   20 tablet   0   . METRONIDAZOLE 500 MG PO TABS   Oral   Take 1 tablet (500 mg total) by mouth 3 (three) times daily.   30 tablet   0     BP 125/70  Pulse 66  Temp 98.7 F (37.1 C) (Oral)  Resp 14  SpO2 97%  Physical Exam  Nursing note and vitals reviewed. Constitutional: She is oriented to person, place, and time. She appears well-developed and well-nourished. No distress.       anxious  HENT:  Head: Normocephalic and atraumatic.  Mouth/Throat: Oropharynx is clear and moist.  Eyes: EOM are normal. Pupils are equal, round, and reactive to light.  Neck: Normal range of motion. Neck supple.  Cardiovascular: Normal rate and regular rhythm.   Pulmonary/Chest: Effort normal and breath sounds normal. No respiratory distress. She has no wheezes. She has no rales. She exhibits no tenderness.  Abdominal: Soft. Bowel sounds are normal. She exhibits no distension and no mass. There is tenderness (mild TTP suprapubic region). There is no rebound and no guarding.  Musculoskeletal: Normal range of motion. She exhibits no edema and no tenderness.        No calf swelling or tenderness  Neurological: She is alert and oriented to person, place, and time.       5/5 motor in all ext, sensation intact  Skin: Skin is warm and dry. No rash noted. No erythema.  Psychiatric: Her behavior is normal.    ED Course  Procedures (including critical care time)  Labs Reviewed  CBC - Abnormal; Notable for the following:    WBC 13.5 (*)     All other components within normal limits  BASIC METABOLIC PANEL - Abnormal; Notable for the following:    Glucose, Bld 112 (*)     GFR calc non Af Amer 80 (*)     All other components within normal limits  DIGOXIN LEVEL - Abnormal; Notable for the following:    Digoxin Level 0.7 (*)     All other components within normal limits  PROTIME-INR - Abnormal; Notable for the following:    Prothrombin Time 23.7 (*)     INR 2.23 (*)     All other components within normal limits  URINALYSIS, ROUTINE W REFLEX MICROSCOPIC  HEPATIC FUNCTION PANEL  LIPASE, BLOOD  POCT I-STAT TROPONIN I  POCT I-STAT TROPONIN I  LAB REPORT - SCANNED   No results found.   1. Diverticulitis of colon      Date: 11/13/2012  Rate: 78  Rhythm: atrial fibrillation  QRS Axis: normal  Intervals: normal  ST/T Wave abnormalities: nonspecific T wave changes  Conduction Disutrbances:none  Narrative Interpretation:   Old EKG Reviewed: unchanged    MDM          Loren Racer, MD 11/13/12 [phone]

## 2012-11-08 NOTE — ED Notes (Signed)
Pt is here with abdominal pain that radiated into chest.  Pt states left side is always in pain from polio

## 2012-11-08 NOTE — ED Provider Notes (Signed)
Patient placed in CDU by Loren Racer, M.D. for further evaluation of her abdominal pain.  Patient is here for abdominal pain that radiated into her chest beginning this morning and has received labs and pain control.   Plan per previous provider is to await until troponin and CT abdomen pelvis. If all is negative patient may be discharged home with followup for her primary care physician.  Patient re-evaluated and is resting comfortably, VSS, with no new complaints or concerns at this time.  On exam: hemodynamically stable, NAD, heart w/ RRR, lungs CTAB, abdomen tender to palpation in the left lower quadrant, suprapubic and right lower quadrant, no peripheral edema or calf tenderness.  BP 133/54  Pulse 64  Temp 98.7 F (37.1 C) (Oral)  Resp 21  SpO2 97%  Discussed with patient current lab and imaging results as well as their care plan, patient questions answered.  Patient is amenable to the plan.   12:01 AM Pt remains comfortable.  CT scan and delta troponin pending.  Marisa Severin, MD will assume care.    Dahlia Client Caydin Yeatts, PA-C 11/09/12 0002

## 2012-11-09 ENCOUNTER — Encounter (HOSPITAL_COMMUNITY): Payer: Self-pay | Admitting: Radiology

## 2012-11-09 ENCOUNTER — Observation Stay (HOSPITAL_COMMUNITY): Payer: Medicare Other

## 2012-11-09 LAB — POCT I-STAT TROPONIN I

## 2012-11-09 MED ORDER — METRONIDAZOLE 500 MG PO TABS: 500.0000 mg | ORAL_TABLET | Freq: Three times a day (TID) | ORAL | Status: DC

## 2012-11-09 MED ORDER — CIPROFLOXACIN HCL 500 MG PO TABS: 500.0000 mg | ORAL_TABLET | Freq: Two times a day (BID) | ORAL | Status: DC

## 2012-11-09 MED ORDER — METRONIDAZOLE 500 MG PO TABS
500.0000 mg | ORAL_TABLET | Freq: Once | ORAL | Status: AC
  Administered 2012-11-09: 500 mg via ORAL
  Filled 2012-11-09: qty 1

## 2012-11-09 MED ORDER — CIPROFLOXACIN HCL 500 MG PO TABS
500.0000 mg | ORAL_TABLET | Freq: Once | ORAL | Status: AC
  Administered 2012-11-09: 500 mg via ORAL
  Filled 2012-11-09: qty 1

## 2012-11-09 NOTE — ED Notes (Signed)
CT shows evidence of diverticulitis. She will be discharged home with prescriptions for Cipro and Flagyl. She is advised of interaction of antibiotics with her warfarin and is to contact her physician or clinic which is monitoring her warfarin for her recommendations and dosage adjustment.  Dione Booze, MD 11/09/12 [phone]

## 2012-11-13 NOTE — ED Provider Notes (Signed)
Medical screening examination/treatment/procedure(s) were performed by non-physician practitioner and as supervising physician I was immediately available for consultation/collaboration.   Loren Racer, MD 11/13/12 [phone]

## 2012-11-23 ENCOUNTER — Encounter (INDEPENDENT_AMBULATORY_CARE_PROVIDER_SITE_OTHER): Payer: Self-pay | Admitting: *Deleted

## 2013-01-09 ENCOUNTER — Emergency Department (INDEPENDENT_AMBULATORY_CARE_PROVIDER_SITE_OTHER): Payer: Medicare Other

## 2013-01-09 ENCOUNTER — Encounter (HOSPITAL_COMMUNITY): Payer: Self-pay | Admitting: Emergency Medicine

## 2013-01-09 ENCOUNTER — Encounter (HOSPITAL_COMMUNITY): Payer: Self-pay | Admitting: *Deleted

## 2013-01-09 ENCOUNTER — Emergency Department (HOSPITAL_COMMUNITY)
Admission: EM | Admit: 2013-01-09 | Discharge: 2013-01-09 | Disposition: A | Discharge status: Home / Self Care | Payer: Medicare Other | Attending: Emergency Medicine | Admitting: Emergency Medicine

## 2013-01-09 ENCOUNTER — Emergency Department (HOSPITAL_COMMUNITY)
Admission: EM | Admit: 2013-01-09 | Discharge: 2013-01-09 | Disposition: A | Discharge status: Nursing Home (Includes ICF) | Payer: Medicare Other | Source: Home / Self Care | Attending: Family Medicine | Admitting: Family Medicine

## 2013-01-09 DIAGNOSIS — R0602 Shortness of breath: Secondary | ICD-10-CM | POA: Insufficient documentation

## 2013-01-09 DIAGNOSIS — Z950 Presence of cardiac pacemaker: Secondary | ICD-10-CM | POA: Insufficient documentation

## 2013-01-09 DIAGNOSIS — Z79899 Other long term (current) drug therapy: Secondary | ICD-10-CM | POA: Insufficient documentation

## 2013-01-09 DIAGNOSIS — J45909 Unspecified asthma, uncomplicated: Secondary | ICD-10-CM

## 2013-01-09 DIAGNOSIS — R6889 Other general symptoms and signs: Secondary | ICD-10-CM | POA: Insufficient documentation

## 2013-01-09 DIAGNOSIS — Z8739 Personal history of other diseases of the musculoskeletal system and connective tissue: Secondary | ICD-10-CM | POA: Insufficient documentation

## 2013-01-09 DIAGNOSIS — E119 Type 2 diabetes mellitus without complications: Secondary | ICD-10-CM | POA: Insufficient documentation

## 2013-01-09 DIAGNOSIS — Z7901 Long term (current) use of anticoagulants: Secondary | ICD-10-CM | POA: Insufficient documentation

## 2013-01-09 DIAGNOSIS — Z8673 Personal history of transient ischemic attack (TIA), and cerebral infarction without residual deficits: Secondary | ICD-10-CM | POA: Insufficient documentation

## 2013-01-09 DIAGNOSIS — R059 Cough, unspecified: Secondary | ICD-10-CM | POA: Insufficient documentation

## 2013-01-09 DIAGNOSIS — R05 Cough: Secondary | ICD-10-CM | POA: Insufficient documentation

## 2013-01-09 DIAGNOSIS — T17308A Unspecified foreign body in larynx causing other injury, initial encounter: Secondary | ICD-10-CM

## 2013-01-09 DIAGNOSIS — J45901 Unspecified asthma with (acute) exacerbation: Secondary | ICD-10-CM

## 2013-01-09 DIAGNOSIS — I2510 Atherosclerotic heart disease of native coronary artery without angina pectoris: Secondary | ICD-10-CM | POA: Insufficient documentation

## 2013-01-09 DIAGNOSIS — Z7982 Long term (current) use of aspirin: Secondary | ICD-10-CM | POA: Insufficient documentation

## 2013-01-09 DIAGNOSIS — R062 Wheezing: Secondary | ICD-10-CM | POA: Insufficient documentation

## 2013-01-09 DIAGNOSIS — Z8679 Personal history of other diseases of the circulatory system: Secondary | ICD-10-CM | POA: Insufficient documentation

## 2013-01-09 LAB — CBC WITH DIFFERENTIAL/PLATELET
Basophils Absolute: 0 10*3/uL (ref 0.0–0.1)
Basophils Relative: 0 % (ref 0–1)
Eosinophils Relative: 4 % (ref 0–5)
HCT: 40.7 % (ref 36.0–46.0)
MCHC: 32.9 g/dL (ref 30.0–36.0)
MCV: 87.2 fL (ref 78.0–100.0)
Monocytes Absolute: 0.8 10*3/uL (ref 0.1–1.0)
Platelets: 218 10*3/uL (ref 150–400)
RDW: 14.7 % (ref 11.5–15.5)

## 2013-01-09 LAB — COMPREHENSIVE METABOLIC PANEL
AST: 21 U/L (ref 0–37)
Albumin: 3.6 g/dL (ref 3.5–5.2)
Calcium: 9.5 mg/dL (ref 8.4–10.5)
Creatinine, Ser: 0.55 mg/dL (ref 0.50–1.10)
GFR calc non Af Amer: 86 mL/min — ABNORMAL LOW (ref >90)
Sodium: 139 mEq/L (ref 135–145)
Total Protein: 6.7 g/dL (ref 6.0–8.3)

## 2013-01-09 MED ORDER — GI COCKTAIL ~~LOC~~
30.0000 mL | Freq: Once | ORAL | Status: AC
  Administered 2013-01-09: 30 mL via ORAL

## 2013-01-09 MED ORDER — ALBUTEROL SULFATE (5 MG/ML) 0.5% IN NEBU
5.0000 mg | INHALATION_SOLUTION | Freq: Once | RESPIRATORY_TRACT | Status: AC
  Administered 2013-01-09: 5 mg via RESPIRATORY_TRACT
  Filled 2013-01-09: qty 1

## 2013-01-09 MED ORDER — GLUCAGON HCL (RDNA) 1 MG IJ SOLR: 1.0000 mg | Freq: Once | INTRAMUSCULAR | Status: DC

## 2013-01-09 MED ORDER — GI COCKTAIL ~~LOC~~
ORAL | Status: AC
  Filled 2013-01-09: qty 30

## 2013-01-09 NOTE — ED Provider Notes (Signed)
History     CSN: [id number]  Arrival date & time 01/09/13  1627   First MD Initiated Contact with Patient 01/09/13 1707      Chief Complaint  Patient presents with  . Cough    (Consider location/radiation/quality/duration/timing/severity/associated sxs/prior treatment) Patient is a 79 y.o. female presenting with cough. The history is provided by the patient and the spouse.  Cough Cough characteristics:  Non-productive Severity:  Moderate Onset quality:  Sudden Duration:  4 hours Timing:  Intermittent Progression:  Waxing and waning Chronicity:  New (onset with swallowing lg centrum silver vit pill at lunch and had severe pain , in throat, began bringing up orange fluid, and wheezing and coughing since.) Smoker: no   Context comment:  During swallowing of pill. Associated symptoms: wheezing   Associated symptoms: no shortness of breath     Past Medical History  Diagnosis Date  . Coronary artery disease   . A-fib   . Stroke   . Pacemaker   . Arthritis   . Diabetes mellitus   . Thyroid disease     Past Surgical History  Procedure Laterality Date  . Cardiac surgery    . 3 cardiac stents    . Abdominal hysterectomy    . Tonsillectomy    . Appendectomy    . Pacemaker placement      No family history on file.  History  Substance Use Topics  . Smoking status: Never Smoker   . Smokeless tobacco: Not on file  . Alcohol Use: No    OB History   Grav Para Term Preterm Abortions TAB SAB Ect Mult Living                  Review of Systems  Constitutional: Negative.   HENT: Negative.   Respiratory: Positive for cough, choking and wheezing. Negative for shortness of breath.   Cardiovascular: Negative.   Gastrointestinal: Negative.     Allergies  Iohexol; Penicillins; and Sulfur  Home Medications   Current Outpatient Rx  Name  Route  Sig  Dispense  Refill  . hydrochlorothiazide (HYDRODIURIL) 12.5 MG tablet   Oral   Take 12.5 mg by mouth. Every other  day         . ciprofloxacin (CIPRO) 500 MG tablet   Oral   Take 1 tablet (500 mg total) by mouth every 12 (twelve) hours.   20 tablet   0   . digoxin (LANOXIN) 0.125 MG tablet   Oral   Take 125 mcg by mouth daily.         . isosorbide mononitrate (IMDUR) 30 MG 24 hr tablet   Oral   Take 30 mg by mouth daily.         Marland Kitchen levothyroxine (SYNTHROID, LEVOTHROID) 100 MCG tablet   Oral   Take 100 mcg by mouth daily.         . metoprolol succinate (TOPROL-XL) 100 MG 24 hr tablet   Oral   Take 150 mg by mouth daily. Take with or immediately following a meal.         . metroNIDAZOLE (FLAGYL) 500 MG tablet   Oral   Take 1 tablet (500 mg total) by mouth 3 (three) times daily.   30 tablet   0   . omeprazole (PRILOSEC) 40 MG capsule   Oral   Take 40 mg by mouth daily.         . pantoprazole (PROTONIX) 40 MG tablet   Oral   Take  40 mg by mouth daily.         Marland Kitchen warfarin (COUMADIN) 5 MG tablet   Oral   Take 5 mg by mouth daily. As directed by dr.           BP 127/66  Pulse 98  Temp(Src) 97.6 F (36.4 C) (Oral)  Resp 20  SpO2 98%  Physical Exam  Nursing note and vitals reviewed. Constitutional: She is oriented to person, place, and time. She appears well-developed and well-nourished. She appears distressed.  Coughing.  HENT:  Mouth/Throat: Oropharynx is clear and moist.  Eyes: Conjunctivae are normal. Pupils are equal, round, and reactive to light.  Neck: Normal range of motion. Neck supple.  Cardiovascular: Intact distal pulses.  An irregularly irregular rhythm present.  Pacemaker left chest, afib rhythm.  Pulmonary/Chest: Effort normal. She has wheezes.  Abdominal: Soft. Bowel sounds are normal.  Lymphadenopathy:    She has no cervical adenopathy.  Neurological: She is alert and oriented to person, place, and time.  Skin: Skin is warm and dry.    ED Course  Procedures (including critical care time)  Labs Reviewed - No data to display Dg Chest 2  View  01/09/2013  *RADIOLOGY REPORT*  Clinical Data: Coughing, wheezing, possible aspiration, history coronary artery disease, pacemaker, stroke, diabetes  CHEST - 2 VIEW  Comparison: 11/08/2012  Findings: Left subclavian transvenous pacemaker lead tip projects over right ventricle. Minimal enlargement of cardiac silhouette. Calcified tortuous aorta. Pulmonary vascularity normal. Azygos fissure noted. Lungs appear emphysematous with minimal central peribronchial thickening. No infiltrate, pleural effusion, or pneumothorax. No acute osseous findings.  IMPRESSION: Minimal enlargement of cardiac silhouette post pacemaker. Question COPD. No acute abnormalities.   Original Report Authenticated By: Ulyses Southward, M.D.      1. Asthma attack       MDM  X-rays reviewed and report per radiologist.  Sent for possible aspiration induced asthma, still mild asthma-new , with afib, pacemaker, sx onset after choking on pill while swallowing.Linna Hoff, MD 01/09/13 2040

## 2013-01-09 NOTE — ED Notes (Signed)
Pt began coughing after taking a centrum silver around 0130.  Pt was sent here from [facility] b/c she continued to wheeze, even though VS are stable.  Pt states the discomfort and wheezing is improving.  Results from X-ray at [facility] are clear.

## 2013-01-09 NOTE — ED Provider Notes (Signed)
History     CSN: [id number]  Arrival date & time 01/09/13  1840   First MD Initiated Contact with Patient 01/09/13 2016      Chief Complaint  Patient presents with  . Choking    (Consider location/radiation/quality/duration/timing/severity/associated sxs/prior treatment) HPI Comments: 79 y/o female presents s/p choking episode. Was taking multivitamin about 8 hours ago when she felt like it got stuck in back of throat. Started coughing/gagging after attempting to wash down with some milk. Bringing up orange liquid. No emesis. No blood. Did have some burning right and central chest pain with episode. Since resolved. At urgent care, patient with unremarkable cxr. Persistent wheezing so transferred here. Does report mild tickling sensation to back of throat for about 2 months. No problems with swallowing previously.  Patient is a 79 y.o. female presenting with general illness. The history is provided by the patient.  Illness  The current episode started today. The onset was sudden. The problem occurs continuously. The problem has been gradually improving. The problem is severe. Nothing relieves the symptoms. Nothing aggravates the symptoms. Associated symptoms include cough and wheezing (resolved). Pertinent negatives include no fever, no abdominal pain, no diarrhea, no nausea, no vomiting, no congestion, no headaches, no rhinorrhea, no neck pain and no rash.    Past Medical History  Diagnosis Date  . Coronary artery disease   . A-fib   . Stroke   . Pacemaker   . Arthritis   . Diabetes mellitus   . Thyroid disease     Past Surgical History  Procedure Laterality Date  . Cardiac surgery    . 3 cardiac stents    . Abdominal hysterectomy    . Tonsillectomy    . Appendectomy    . Pacemaker placement      History reviewed. No pertinent family history.  History  Substance Use Topics  . Smoking status: Never Smoker   . Smokeless tobacco: Not on file  . Alcohol Use: No    OB  History   Grav Para Term Preterm Abortions TAB SAB Ect Mult Living                  Review of Systems  Constitutional: Negative for fever and chills.  HENT: Negative for congestion, rhinorrhea and neck pain.   Respiratory: Positive for cough, choking, shortness of breath (mild during event and with wheezing after. resolved) and wheezing (resolved).   Cardiovascular: Negative for chest pain and leg swelling.  Gastrointestinal: Negative for nausea, vomiting, abdominal pain and diarrhea.  Genitourinary: Negative for dysuria, hematuria, flank pain and difficulty urinating.  Musculoskeletal: Negative for back pain.  Skin: Negative for color change and rash.  Neurological: Negative for dizziness and headaches.  All other systems reviewed and are negative.    Allergies  Iohexol; Penicillins; and Sulfur  Home Medications   Current Outpatient Rx  Name  Route  Sig  Dispense  Refill  . aspirin EC 81 MG tablet   Oral   Take 81 mg by mouth at bedtime.         . digoxin (LANOXIN) 0.125 MG tablet   Oral   Take 125 mcg by mouth daily.         . hydrochlorothiazide (HYDRODIURIL) 12.5 MG tablet   Oral   Take 12.5 mg by mouth every other day.          . isosorbide mononitrate (IMDUR) 30 MG 24 hr tablet   Oral   Take 30 mg by mouth  daily.         . levothyroxine (SYNTHROID, LEVOTHROID) 100 MCG tablet   Oral   Take 100 mcg by mouth daily.         . metoprolol succinate (TOPROL-XL) 100 MG 24 hr tablet   Oral   Take 50-100 mg by mouth 2 (two) times daily. 100mg  in the morning, 50mg  at lunch Take with or immediately following a meal.         . Multiple Vitamin (MULTIVITAMIN WITH MINERALS) TABS   Oral   Take 1 tablet by mouth daily.         Marland Kitchen omeprazole (PRILOSEC) 40 MG capsule   Oral   Take 40 mg by mouth daily.         Marland Kitchen POTASSIUM PO   Oral   Take 1 tablet by mouth daily as needed (for muscle cramps).         . traMADol (ULTRAM) 50 MG tablet   Oral   Take  25-50 mg by mouth every 6 (six) hours as needed for pain (for pain).         Marland Kitchen warfarin (COUMADIN) 5 MG tablet   Oral   Take 2.5 mg by mouth daily.            BP 128/49  Pulse 64  Temp(Src) 97.7 F (36.5 C) (Oral)  Resp 21  SpO2 97%  Physical Exam  Nursing note and vitals reviewed. Constitutional: She is oriented to person, place, and time. She appears well-developed and well-nourished. No distress.  HENT:  Head: Normocephalic and atraumatic.  Mouth/Throat: Oropharynx is clear and moist.  Eyes: Conjunctivae are normal. Right eye exhibits no discharge. Left eye exhibits no discharge.  Neck: No tracheal deviation present.  Cardiovascular: Normal rate, normal heart sounds and intact distal pulses.   Irregularly irregular rhythm.  Pulmonary/Chest: Effort normal and breath sounds normal. No stridor. No respiratory distress. She has no wheezes. She has no rales.  Abdominal: Soft. She exhibits no distension. There is no tenderness. There is no guarding.  Musculoskeletal: She exhibits no edema and no tenderness.  Neurological: She is alert and oriented to person, place, and time.  Skin: Skin is warm and dry.  Psychiatric: She has a normal mood and affect. Her behavior is normal.    ED Course  Procedures (including critical care time)  Labs Reviewed  COMPREHENSIVE METABOLIC PANEL - Abnormal; Notable for the following:    Glucose, Bld 133 (*)    GFR calc non Af Amer 86 (*)    All other components within normal limits  CBC WITH DIFFERENTIAL  TROPONIN I   Dg Chest 2 View  01/09/2013  *RADIOLOGY REPORT*  Clinical Data: Coughing, wheezing, possible aspiration, history coronary artery disease, pacemaker, stroke, diabetes  CHEST - 2 VIEW  Comparison: 11/08/2012  Findings: Left subclavian transvenous pacemaker lead tip projects over right ventricle. Minimal enlargement of cardiac silhouette. Calcified tortuous aorta. Pulmonary vascularity normal. Azygos fissure noted. Lungs appear  emphysematous with minimal central peribronchial thickening. No infiltrate, pleural effusion, or pneumothorax. No acute osseous findings.  IMPRESSION: Minimal enlargement of cardiac silhouette post pacemaker. Question COPD. No acute abnormalities.   Original Report Authenticated By: Ulyses Southward, M.D.      1. Wheezing   2. Choking      Date: 01/09/2013  Rate: 60  Rhythm: junctional rhythm  QRS Axis: right  Intervals: indeterminate  ST/T Wave abnormalities: normal  Conduction Disutrbances:nonspecific intraventricular conduction delay  Narrative Interpretation:   Old EKG  Reviewed: changes noted    MDM    79 y/o F p/w wheezing after choking episode. Currently feeling fine. At baseline. No sob.  HDS, af. NAD. Non-toxic. Minimal end exp wheezing. Given neb with resolution. Patient remains HDS. Asymptomatic.  Unlikely PNA as CXR neg, no leukocytosis, no cough prior to event or currently, no fever Unlikely COPD/Asthma exacerbation as no history Unlikely ACS as troponin neg, EKG unremarkable Unlikely PE as atypical presentation, low risk per [facility] Doubt Aortic Dissection, Pancreatitis, Pneumothorax, Arrhythmia, Endo/Myo/Pericarditis, Esophageal pathology, or other Emergent pathology. Doubt aspirated FB. Wheezing never localized to one area. Diffuse.   O2 saturations high 90's on room air   Dc home. Return precautions given. Follow up with primary care provider. Patient in agreement with plan   Labs and imaging reviewed by myself and considered in medical decision making if ordered. Imaging interpreted by radiology.   Discussed case with Dr. Silverio Lay who is in agreement with assessment and plan.            Stevie Kern, MD 01/10/13 [phone]

## 2013-01-09 NOTE — ED Notes (Signed)
Patient swallowed her vitamin this evening, felt as though pill became lodged in throat.  Reports a lot of coughing associated with phlegm production.  Patient reports having pain with coughing and sputtering.  Now pain is better, but continues coughing and has wheezing.

## 2013-01-10 NOTE — ED Provider Notes (Signed)
I have supervised the resident on the management of this patient and agree with the note above. I personally interviewed and examined the patient and my addendum is below.   [name] is a 79 y.o. female her ewith wheezing after choking on a pill. Felt well now. Tolerated PO in the waiting room. EKG nonspecific changes. Wheezing improved with albuterol. CXR showed no foreign bodies. She likely swallow the pill. I gave her detailed return instructions.    Richardean Canal, MD 01/10/13 2215

## 2013-01-23 ENCOUNTER — Encounter (INDEPENDENT_AMBULATORY_CARE_PROVIDER_SITE_OTHER): Payer: Self-pay | Admitting: Internal Medicine

## 2013-01-23 ENCOUNTER — Ambulatory Visit (INDEPENDENT_AMBULATORY_CARE_PROVIDER_SITE_OTHER): Payer: Medicare Other | Admitting: Internal Medicine

## 2013-01-23 VITALS — BP 130/80 | HR 66 | Temp 97.0°F | Resp 18 | Ht 69.0 in | Wt 172.0 lb

## 2013-01-23 DIAGNOSIS — K5900 Constipation, unspecified: Secondary | ICD-10-CM

## 2013-01-23 DIAGNOSIS — Z8719 Personal history of other diseases of the digestive system: Secondary | ICD-10-CM

## 2013-01-23 DIAGNOSIS — I2510 Atherosclerotic heart disease of native coronary artery without angina pectoris: Secondary | ICD-10-CM | POA: Insufficient documentation

## 2013-01-23 DIAGNOSIS — I482 Chronic atrial fibrillation, unspecified: Secondary | ICD-10-CM | POA: Insufficient documentation

## 2013-01-23 DIAGNOSIS — K219 Gastro-esophageal reflux disease without esophagitis: Secondary | ICD-10-CM | POA: Insufficient documentation

## 2013-01-23 DIAGNOSIS — E039 Hypothyroidism, unspecified: Secondary | ICD-10-CM | POA: Insufficient documentation

## 2013-01-23 MED ORDER — DOCUSATE SODIUM 100 MG PO CAPS: 100.0000 mg | ORAL_CAPSULE | Freq: Every day | ORAL | Status: DC

## 2013-01-23 NOTE — Patient Instructions (Addendum)
High fiber diet. Take Colace (stool softener) 200 mg by mouth daily at bedtime. CT colography to be scheduled; Physician will contact you with the results on this study is completed.

## 2013-01-24 NOTE — Progress Notes (Signed)
REASON FOR CONSULTATION: History of abdominal pain and abnormal CT.  HISTORY OF PRESENT ILLNESS: The patient is 79 year old Caucasian  female, who is referred through courtesy of Dr. Jillyn Hidden for GI evaluation.  Today, the patient is accompanied by her husband, [name].  The patient was evaluated in the emergency room at [facility]  on November 09, 2012, for lower abdominal pain and on CT she had  thickening to sigmoid colon along with diverticula. She was felt to  have diverticulitis. She was treated with Cipro and Flagyl with  complete resolution of her symptoms. Further evaluation was advised  when she had recovered from her acute illness.  The patient states she has not had any pain for at least 8-9 weeks. She  complains of intermittent constipation. Every now and then she has a  normal bowel movement. She had single episode of hematochezia few  months ago. She does not have a good appetite. She also has lost taste  for different foods and has lost 10 pounds in 1 year. She denies nausea  and vomiting. Her heartburn is well controlled with therapy. She has  dysphagia with large pills, but not with food. She was seen in  emergency room on January 09, 2013, when she had difficulty swallowing  Centrum Silver pill. She complains of feeling nervous and anxious all  the time. She denies fever chills or night sweats.  CURRENT MEDICATIONS: Digoxin 0.125 mg p.o. daily, hydrochlorothiazide  25 mg p.o. every other day, isosorbide mononitrate 30 mg p.o. daily,  levothyroxine 100 mcg p.o. daily, metoprolol succinate 100 mg p.o.  q.a.m. and 50 mg p.o. every afternoon, omeprazole 40 mg p.o. q.a.m., OTC  potassium 1 tablet p.o. daily p.r.n. cramps, tramadol 20-50 mg p.o. q.6  p.r.n. pain, and Warfarin 2.5 mg p.o. daily.  PAST MEDICAL HISTORY:  1. History of sigmoid diverticulitis in February 2001. She had an  incomplete colonoscopy in April 2001 revealing multiple diverticula  at the descending  and sigmoid colon.  2. She had acute colitis at age 76 possibly infectious with full  recovery.  3. Anxiety neurosis.  4. She has been diabetic for over 15 years, start diet controlled.  5. Coronary artery disease. She had stent to circumflex artery in  2003 and bare metal stent to mid LAD in July 2011.  6. Chronic atrial fibrillation, maintained on warfarin.  7. Dyslipidemia.  8. Hypothyroidism.  9. History of anxiety.  10.Chronic GERD. She has been on therapy for more than 1 year.  11.She had tonsillectomy at young age. She had BSO with hysterectomy  and incidental appendectomy in 1978. She also had polio at age 38.  38.She has permanent pacemaker.  13.Peripheral neuropathy.  ALLERGIES: To Iohexol, penicillin, and sulfa.  FAMILY HISTORY: Mother died within a few weeks of diagnosis of  gynecologic carcinoma details not available. Father died suddenly at  age 54, presumed CAD. She does not have any siblings.  SOCIAL HISTORY: She is married. She is now retired. She owned and  managed antique business. She had 3 biologic and 2 adopted children.  One of her sons had surgery for cancer involving basal skull when he was  in 36s and he is now in remission. She has never smoked cigarettes and  does not drink alcohol.  PHYSICAL EXAMINATION: VITAL SIGNS: Weight 172 pounds. She is 69  inches tall. Pulse 66 per minute and regular, blood pressure 130/80,  temp is 97.  HEENT: Conjunctivae is pink. Sclera is nonicteric. Oropharyngeal  mucosa is normal.  NECK: No neck masses or thyromegaly noted.  CARDIAC: Regular rhythm. Normal S1 and S2. No murmur or gallop noted.  LUNGS: Clear to auscultation.  ABDOMEN: Flat. Bowel sounds are normal. No bruits noted. On  palpation, abdomen is soft and nontender without organomegaly or masses.  RECTAL: Deferred. She does not have peripheral edema or clubbing.  LABORATORY DATA: From November 08, 2013; reviewed WBC 7.8, H and H 13.4  and 40.7, and platelet count  218k. Electrolytes normal. Glucose 133,  BUN 22, creatinine 0.55, bilirubin 0.4, AP 83, AST 21, ALT 14, total  protein 6.7 with albumin of 3.6, and calcium 9.5.  Abdominopelvic CT from November 09, 2012, reviewed and compared with  prior study of October 18, 2012. Current study shows segmental wall  thickening to sigmoid colon, along with diverticulosis. These changes  were felt to be secondary to diverticulitis. In addition, cystic  structure noted inferior to the right lobe of the liver laterally  contiguous to the colon measuring 7.4 x 5.4 cm. Unchanged since prior  study.  ASSESSMENT: The patient is an 79 year-old Caucasian female, who  presented  About 10 weeks ago with lower abdominal pain and had CT evidence of  diverticulitis and responded nicely to antibiotic therapy. She was  initially treated for diverticulitis back in February. 2001. While  these changes can be attributed to sigmoid diverticulitis, need to make  sure that she does not have sigmoid colon carcinoma mimicking  diverticulitis. The patient's last in complete colonoscopy was in April  2001. At this point, she is very reluctant to undergo a colonoscopy  because she apparently had problems with sedation at the time of  placement of permanent pacemaker. Considering that she is on warfarin,  would not be unreasonable to proceed with a virtual colonoscopy and if  this study is normal, she would not need further workup.  She complains of intermittent constipation and would benefit from a high-  fiber diet in addition of stool softener.  Anxiety, the patient complained of this symptom multiple times.  RECOMMENDATIONS:  1. Hemoccult x1.  2. We will schedule patient for virtual colonoscopy.  3. Colace 200 mg p.o. at bedtime.  4. High-fiber diet.  I asked the patient to make an appointment with Dr. Jillyn Hidden regarding her  anxiety, which appears to be quite distressing to her.  We appreciate the opportunity to participate in  the care of this nice  lady.

## 2013-01-24 NOTE — Consult Note (Signed)
NAME:  [name], [name]                     ACCOUNT NO.:  MEDICAL RECORD NO.:  [id number]  LOCATION:                                 FACILITY:  PHYSICIAN:  Lionel December, M.D.    DATE OF BIRTH:  06/20/1930  DATE OF CONSULTATION:  01/23/2013 DATE OF DISCHARGE:                                CONSULTATION   REASON FOR CONSULTATION:  History of abdominal pain and abnormal CT.  HISTORY OF PRESENT ILLNESS:  The patient is 79 year old Caucasian female, who is referred through courtesy of Dr. Jillyn Hidden for GI evaluation. Today, the patient is accompanied by her husband, Dorinda Hill.  The patient was evaluated in the emergency room at [facility] on November 09, 2012, for lower abdominal pain and on CT she had thickening to sigmoid colon along with diverticula.  She was felt to have diverticulitis.  She was treated with Cipro and Flagyl with complete resolution of her symptoms.  Further evaluation was advised when she had recovered from her acute illness.  The patient states she has not had any pain for at least 8-9 weeks.  She complains of intermittent constipation.  Every now and then she has a normal bowel movement.  She had single episode of hematochezia few months ago.  She does not have a good appetite.  She also has lost taste for different foods and has lost 10 pounds in 1 year.  She denies nausea and vomiting.  Her heartburn is well controlled with therapy.  She has dysphagia with large pills, but not with food.  She was seen in emergency room on January 09, 2013, when she had difficulty swallowing Centrum Silver pill.  She complains of feeling nervous and anxious all the time.  She denies fever chills or night sweats.  CURRENT MEDICATIONS:  Digoxin 0.125 mg p.o. daily, hydrochlorothiazide 25 mg p.o. every other day, isosorbide mononitrate 30 mg p.o. daily, levothyroxine 100 mcg p.o. daily, metoprolol succinate 100 mg p.o. q.a.m. and 50 mg p.o. every afternoon, omeprazole 40 mg  p.o. q.a.m., OTC potassium 1 tablet p.o. daily p.r.n. cramps, tramadol 20-50 mg p.o. q.6 p.r.n. pain, and Warfarin 2.5 mg p.o. daily.  PAST MEDICAL HISTORY: 1. History of sigmoid diverticulitis in February 2001.  She had a     incomplete colonoscopy in April 2001 revealing multiple diverticula     at the descending and sigmoid colon. 2. She had acute colitis at age 79 possibly infectious with full     recovery. 3. Anxiety neurosis. 4. She has been diabetic for over 15 years, start diet controlled. 5. Coronary artery disease.  She had stent to circumflex artery in     2003 and bare metal stent to mid LAD in July 2011. 6. Chronic atrial fibrillation, maintained on warfarin. 7. Dyslipidemia. 8. Hypothyroidism. 9. History of anxiety. 10.Chronic GERD.  She has been on therapy for more than 1 year. 11.She had tonsillectomy at young age.  She had BSO with hysterectomy     and incidental appendectomy in 1978.  She also had polio at age 18. 14.She has permanent pacemaker. 13.Peripheral neuropathy.  ALLERGIES:  To Iohexol, penicillin, and sulfa.  FAMILY  HISTORY:  Mother died within a few weeks of diagnosis of gynecologic carcinoma details not available.  Father died suddenly at age 40, presumed CAD.  She does not have any siblings.  SOCIAL HISTORY:  She is married.  She is now retired.  She owned and managed antique business.  She had 3 biologic and 2 adopted children. One of her sons had surgery for cancer involving basal skull when he was in 15s and he is now in remission.  She has never smoked cigarettes and does not drink alcohol.  PHYSICAL EXAMINATION:  VITAL SIGNS:  Weight 172 pounds.  She is 69 inches tall.  Pulse 66 per minute and regular, blood pressure 130/80, temp is 97. HEENT:  Conjunctivae is pink.  Sclera is nonicteric.  Oropharyngeal mucosa is normal. NECK:  No neck masses or thyromegaly noted. CARDIAC:  Regular rhythm.  Normal S1 and S2.  No murmur or gallop  noted. LUNGS:  Clear to auscultation. ABDOMEN:  Flat.  Bowel sounds are normal.  No bruits noted.  On palpation, abdomen is soft and nontender without organomegaly or masses. RECTAL:  Deferred.  She does not have peripheral edema or clubbing.  LABORATORY DATA:  From November 08, 2013; reviewed WBC 7.8, H and H 13.4 and 40.7, and platelet count 218k.  Electrolytes normal.  Glucose 133, BUN 22, creatinine 0.55, bilirubin 0.4, AP 83, AST 21, ALT 14, total protein 6.7 with albumin of 3.6, and calcium 9.5.  Abdominopelvic CT from November 09, 2012, reviewed and compared with prior study of October 18, 2012.  Current study shows segmental wall thickening to sigmoid colon, along with diverticulosis.  These changes were felt to be secondary to diverticulitis.  In addition, cystic structure noted inferior to the right lobe of the liver laterally contiguous to the colon measuring 7.4 x 5.4 cm.  Unchanged since prior study.  ASSESSMENT:  The patient is 79 year old Caucasian female, who recently presented with lower abdominal pain and had CT evidence of diverticulitis and responded nicely to antibiotic therapy.  She was initially treated for diverticulitis back in February.  2001.  While these changes can be attributed to sigmoid diverticulitis, need to make sure that she does not have sigmoid colon carcinoma mimicking diverticulitis.  The patient's last in complete colonoscopy was in April 2001.  At this point, she is very reluctant to undergo a colonoscopy because she apparently had problems with sedation at the time of placement of permanent pacemaker.  Considering that she is on warfarin, would not be unreasonable to proceed with a virtual colonoscopy and if this study is normal, she would not need further workup. She complains of intermittent constipation and would benefit from a high- fiber diet in addition of stool softener.  Anxiety, the patient complained of this symptom multiple  times.  RECOMMENDATIONS: 1. Hemoccult x1. 2. We will schedule patient for virtual colonoscopy. 3. Colace 200 mg p.o. at bedtime. 4. High-fiber diet.  I asked the patient to make an appointment with Dr. Jillyn Hidden regarding her anxiety, which appears to be quite distressing to her.  We appreciate the opportunity to participate in the care of this nice lady.          ______________________________ Lionel December, M.D.     NR/MEDQ  D:  01/23/2013  T:  01/24/2013  Job:  [id number]

## 2013-01-26 DIAGNOSIS — Z8719 Personal history of other diseases of the digestive system: Secondary | ICD-10-CM | POA: Insufficient documentation

## 2013-01-31 ENCOUNTER — Telehealth (INDEPENDENT_AMBULATORY_CARE_PROVIDER_SITE_OTHER): Payer: Self-pay | Admitting: *Deleted

## 2013-01-31 NOTE — Telephone Encounter (Addendum)
rec'd phone call from [facility] @ [facility], she called Ms Dwight to get her sch'd for virtual TCS and once Alvino Chapel started explaining procedure to her, patient stated she was very concerned about her heart condition and didn't think this was the right way to go   I did talk to patient and explained to her that either way she would have to be cleaned, they were under the impression if they done the virtual TCS they wouldn't have prep as regular TCS does   She wants to check with her cardiologist before scheduling virtual TCS

## 2013-02-01 NOTE — Telephone Encounter (Signed)
Will schedule test when patient ready

## 2013-02-18 ENCOUNTER — Encounter (HOSPITAL_COMMUNITY): Payer: Self-pay | Admitting: *Deleted

## 2013-02-18 ENCOUNTER — Emergency Department (INDEPENDENT_AMBULATORY_CARE_PROVIDER_SITE_OTHER)
Admission: EM | Admit: 2013-02-18 | Discharge: 2013-02-18 | Disposition: A | Discharge status: Home / Self Care | Payer: Medicare Other | Source: Home / Self Care | Attending: Family Medicine | Admitting: Family Medicine

## 2013-02-18 DIAGNOSIS — S30860A Insect bite (nonvenomous) of lower back and pelvis, initial encounter: Secondary | ICD-10-CM

## 2013-02-18 DIAGNOSIS — W57XXXA Bitten or stung by nonvenomous insect and other nonvenomous arthropods, initial encounter: Secondary | ICD-10-CM

## 2013-02-18 MED ORDER — PRAMOXINE-MENTHOL-PETROLATUM 1-0.5-30 % EX CREA: 1.0000 "application " | TOPICAL_CREAM | Freq: Two times a day (BID) | CUTANEOUS | Status: DC | PRN

## 2013-02-18 MED ORDER — MUPIROCIN 2 % EX OINT: TOPICAL_OINTMENT | Freq: Three times a day (TID) | CUTANEOUS | Status: DC

## 2013-02-18 MED ORDER — TRIAMCINOLONE ACETONIDE 0.5 % EX OINT: TOPICAL_OINTMENT | Freq: Two times a day (BID) | CUTANEOUS | Status: DC

## 2013-02-18 NOTE — ED Provider Notes (Signed)
History     CSN: [id number]  Arrival date & time 02/18/13  1102   First MD Initiated Contact with Patient 02/18/13 1119      Chief Complaint  Patient presents with  . Tick Removal    (Consider location/radiation/quality/duration/timing/severity/associated sxs/prior treatment) HPI Comments: 79 year old female with history of diet controlled diabetes and chronic anticoagulation with warfarin among other chronic comorbidities. Here complaining of a tick bite in her right groin. Patient noticed an area of itchiness this morning and was able to self detach a small tick that she placed in a plastic bag and brought with her to this appointment. Patient reports that she had been gardening sitting on a stool close to the ground level last week. No other known recent outdoor exposures. Denies fever or chills. Appetite is good. Denies general malaise or headache. No other body areas with skin rashes. She has been scratching at the site of the tick attachment which is very pruriginous and developed an excoriation. Denies pain or drainage.   Past Medical History  Diagnosis Date  . Coronary artery disease   . A-fib   . Stroke   . Pacemaker   . Arthritis   . Diabetes mellitus   . Thyroid disease     Past Surgical History  Procedure Laterality Date  . Cardiac surgery    . 3 cardiac stents    . Abdominal hysterectomy    . Tonsillectomy    . Appendectomy    . Pacemaker placement      History reviewed. No pertinent family history.  History  Substance Use Topics  . Smoking status: Never Smoker   . Smokeless tobacco: Never Used  . Alcohol Use: No    OB History   Grav Para Term Preterm Abortions TAB SAB Ect Mult Living                  Review of Systems  Constitutional: Negative for fever, chills, activity change, appetite change and fatigue.  Gastrointestinal: Negative for nausea, vomiting, abdominal pain and diarrhea.  Musculoskeletal: Negative for myalgias, joint swelling and  arthralgias.  Skin: Positive for rash.  Neurological: Negative for dizziness and headaches.  All other systems reviewed and are negative.    Allergies  Iohexol; Penicillins; and Sulfur  Home Medications   Current Outpatient Rx  Name  Route  Sig  Dispense  Refill  . digoxin (LANOXIN) 0.125 MG tablet   Oral   Take 125 mcg by mouth daily.         Marland Kitchen docusate sodium (COLACE) 100 MG capsule   Oral   Take 1 capsule (100 mg total) by mouth at bedtime.   10 capsule   0   . hydrochlorothiazide (HYDRODIURIL) 12.5 MG tablet   Oral   Take 12.5 mg by mouth every other day.          . isosorbide mononitrate (IMDUR) 30 MG 24 hr tablet   Oral   Take 30 mg by mouth daily.         Marland Kitchen levothyroxine (SYNTHROID, LEVOTHROID) 100 MCG tablet   Oral   Take 100 mcg by mouth daily.         . metoprolol succinate (TOPROL-XL) 100 MG 24 hr tablet   Oral   Take 50-100 mg by mouth 2 (two) times daily. 100mg  in the morning, 50mg  at lunch Take with or immediately following a meal.         . mupirocin ointment (BACTROBAN) 2 %   Topical  Apply topically 3 (three) times daily.   22 g   0   . omeprazole (PRILOSEC) 40 MG capsule   Oral   Take 40 mg by mouth daily.         Marland Kitchen POTASSIUM PO   Oral   Take 1 tablet by mouth daily as needed (for muscle cramps).         . Pramoxine-Menthol-Petrolatum 1-0.5-30 % CREA   Apply externally   Apply 1 application topically 2 (two) times daily as needed.   1 Tube   0   . traMADol (ULTRAM) 50 MG tablet   Oral   Take 25-50 mg by mouth every 6 (six) hours as needed for pain (for pain).         . triamcinolone ointment (KENALOG) 0.5 %   Topical   Apply topically 2 (two) times daily.   30 g   0   . warfarin (COUMADIN) 5 MG tablet   Oral   Take 2.5 mg by mouth daily.            BP 156/79  Pulse 78  Temp(Src) 98.2 F (36.8 C) (Oral)  Resp 16  SpO2 97%  Physical Exam  Nursing note and vitals reviewed. Constitutional: She  appears well-developed and well-nourished. No distress.  HENT:  Head: Normocephalic and atraumatic.  Mouth/Throat: Oropharynx is clear and moist. No oropharyngeal exudate.  Eyes: Conjunctivae and EOM are normal. Pupils are equal, round, and reactive to light. No scleral icterus.  Neck: Neck supple.  Cardiovascular: Normal heart sounds.   Pulmonary/Chest: Breath sounds normal.  Abdominal: Soft. There is no tenderness.  Lymphadenopathy:    She has no cervical adenopathy.       Right: No inguinal adenopathy present.       Left: No inguinal adenopathy present.  Neurological: She is alert.  Skin: She is not diaphoretic.  Right groin: small excoriation with associated base erythema, no induration, fluctuations, pustules, crusting or drainage.    ED Course  Procedures (including critical care time)  Labs Reviewed - No data to display No results found.   1. Tick bite of groin, initial encounter       MDM  Tick was observed small not I-xodes type. Not engorged. Patient without general symptoms. Impress focal allergic reaction at the site of thick bite. No signs of infection currently. Prescribed mupirocin and triamcinolone ointment. Pramoxine based cream also prescribed to help with itchiness. Supportive care and red flags that should prompt her return to medical attention discussed with patient and provided in writing.        Sharin Grave, MD 02/19/13 [phone]

## 2013-02-18 NOTE — ED Notes (Signed)
Pt  Stated   She  Pulled  A  Small  Tick  From     Her  r  Inguinal  Area      sev  Days  Ago  She  States  The  Area   Around     [employer]  Is  inflammed swollen  Tender        She  States  She  Used  Arboriculturist

## 2013-02-19 ENCOUNTER — Ambulatory Visit: Payer: Self-pay | Admitting: Cardiovascular Disease

## 2013-02-19 DIAGNOSIS — I4891 Unspecified atrial fibrillation: Secondary | ICD-10-CM

## 2013-02-19 DIAGNOSIS — Z7901 Long term (current) use of anticoagulants: Secondary | ICD-10-CM | POA: Insufficient documentation

## 2013-04-16 ENCOUNTER — Ambulatory Visit (INDEPENDENT_AMBULATORY_CARE_PROVIDER_SITE_OTHER): Payer: Medicare Other | Admitting: Pharmacist Clinician (PhC)/ Clinical Pharmacy Specialist

## 2013-04-16 ENCOUNTER — Other Ambulatory Visit: Payer: Self-pay | Admitting: *Deleted

## 2013-04-16 DIAGNOSIS — Z7901 Long term (current) use of anticoagulants: Secondary | ICD-10-CM

## 2013-04-16 DIAGNOSIS — I4891 Unspecified atrial fibrillation: Secondary | ICD-10-CM

## 2013-04-16 LAB — POCT INR: INR: 4

## 2013-04-17 ENCOUNTER — Telehealth: Payer: Self-pay | Admitting: Cardiovascular Disease

## 2013-04-17 NOTE — Telephone Encounter (Signed)
Message forwarded to [facility]. Berlinda Last, LPN for Dr. Alanda Amass to review and advise.

## 2013-04-17 NOTE — Telephone Encounter (Signed)
Pt. Informed that Dr. Alanda Amass has changed the diovan which she could not afford to losartan 50 mg daily

## 2013-04-17 NOTE — Telephone Encounter (Signed)
She was given prescription yesterday for Diovan-Found out she can not afford it! What else can she take in the place of it?

## 2013-04-20 ENCOUNTER — Encounter: Payer: Self-pay | Admitting: Cardiovascular Disease

## 2013-04-24 ENCOUNTER — Encounter: Payer: Self-pay | Admitting: Cardiovascular Disease

## 2013-04-28 ENCOUNTER — Other Ambulatory Visit: Payer: Self-pay | Admitting: Cardiovascular Disease

## 2013-05-01 ENCOUNTER — Other Ambulatory Visit: Payer: Self-pay | Admitting: Pharmacist Clinician (PhC)/ Clinical Pharmacy Specialist

## 2013-05-01 MED ORDER — WARFARIN SODIUM 2.5 MG PO TABS: ORAL_TABLET | ORAL | Status: DC

## 2013-05-02 ENCOUNTER — Other Ambulatory Visit: Payer: Self-pay | Admitting: Pharmacist Clinician (PhC)/ Clinical Pharmacy Specialist

## 2013-05-02 ENCOUNTER — Other Ambulatory Visit: Payer: Self-pay | Admitting: Cardiovascular Disease

## 2013-05-03 ENCOUNTER — Ambulatory Visit (INDEPENDENT_AMBULATORY_CARE_PROVIDER_SITE_OTHER): Payer: Self-pay | Admitting: Pharmacist Clinician (PhC)/ Clinical Pharmacy Specialist

## 2013-05-03 DIAGNOSIS — I4891 Unspecified atrial fibrillation: Secondary | ICD-10-CM

## 2013-05-03 DIAGNOSIS — Z7901 Long term (current) use of anticoagulants: Secondary | ICD-10-CM

## 2013-05-08 ENCOUNTER — Inpatient Hospital Stay (HOSPITAL_COMMUNITY)
Admission: EM | Admit: 2013-05-08 | Discharge: 2013-05-10 | DRG: 287 | Disposition: A | Discharge status: Home / Self Care | Payer: Medicare Other | Attending: Cardiovascular Disease | Admitting: Cardiovascular Disease

## 2013-05-08 ENCOUNTER — Encounter (HOSPITAL_COMMUNITY): Payer: Self-pay | Admitting: *Deleted

## 2013-05-08 ENCOUNTER — Emergency Department (HOSPITAL_COMMUNITY): Payer: Medicare Other

## 2013-05-08 DIAGNOSIS — E119 Type 2 diabetes mellitus without complications: Secondary | ICD-10-CM | POA: Diagnosis present

## 2013-05-08 DIAGNOSIS — I059 Rheumatic mitral valve disease, unspecified: Secondary | ICD-10-CM | POA: Diagnosis present

## 2013-05-08 DIAGNOSIS — R079 Chest pain, unspecified: Secondary | ICD-10-CM

## 2013-05-08 DIAGNOSIS — Z7901 Long term (current) use of anticoagulants: Secondary | ICD-10-CM

## 2013-05-08 DIAGNOSIS — I4891 Unspecified atrial fibrillation: Secondary | ICD-10-CM | POA: Diagnosis present

## 2013-05-08 DIAGNOSIS — Z79899 Other long term (current) drug therapy: Secondary | ICD-10-CM

## 2013-05-08 DIAGNOSIS — I428 Other cardiomyopathies: Secondary | ICD-10-CM | POA: Diagnosis present

## 2013-05-08 DIAGNOSIS — I2510 Atherosclerotic heart disease of native coronary artery without angina pectoris: Principal | ICD-10-CM | POA: Diagnosis present

## 2013-05-08 DIAGNOSIS — Z950 Presence of cardiac pacemaker: Secondary | ICD-10-CM

## 2013-05-08 DIAGNOSIS — F439 Reaction to severe stress, unspecified: Secondary | ICD-10-CM

## 2013-05-08 DIAGNOSIS — I200 Unstable angina: Secondary | ICD-10-CM | POA: Diagnosis present

## 2013-05-08 DIAGNOSIS — E039 Hypothyroidism, unspecified: Secondary | ICD-10-CM | POA: Diagnosis present

## 2013-05-08 DIAGNOSIS — I495 Sick sinus syndrome: Secondary | ICD-10-CM | POA: Diagnosis present

## 2013-05-08 DIAGNOSIS — Z8673 Personal history of transient ischemic attack (TIA), and cerebral infarction without residual deficits: Secondary | ICD-10-CM

## 2013-05-08 DIAGNOSIS — I429 Cardiomyopathy, unspecified: Secondary | ICD-10-CM | POA: Diagnosis present

## 2013-05-08 DIAGNOSIS — I482 Chronic atrial fibrillation, unspecified: Secondary | ICD-10-CM | POA: Diagnosis present

## 2013-05-08 DIAGNOSIS — K219 Gastro-esophageal reflux disease without esophagitis: Secondary | ICD-10-CM | POA: Diagnosis present

## 2013-05-08 DIAGNOSIS — M129 Arthropathy, unspecified: Secondary | ICD-10-CM | POA: Diagnosis present

## 2013-05-08 HISTORY — DX: Anxiety disorder, unspecified: F41.9

## 2013-05-08 LAB — BASIC METABOLIC PANEL
CO2: 30 mEq/L (ref 19–32)
Calcium: 9.2 mg/dL (ref 8.4–10.5)
Creatinine, Ser: 0.59 mg/dL (ref 0.50–1.10)
Glucose, Bld: 146 mg/dL — ABNORMAL HIGH (ref 70–99)

## 2013-05-08 LAB — CBC
MCH: 28.7 pg (ref 26.0–34.0)
MCV: 87.4 fL (ref 78.0–100.0)
Platelets: 208 10*3/uL (ref 150–400)
RBC: 4.92 MIL/uL (ref 3.87–5.11)

## 2013-05-08 LAB — APTT: aPTT: 47 seconds — ABNORMAL HIGH (ref 24–37)

## 2013-05-08 LAB — TROPONIN I
Troponin I: <0.3 ng/mL (ref <0.30)
Troponin I: <0.3 ng/mL (ref <0.30)

## 2013-05-08 LAB — DIGOXIN LEVEL: Digoxin Level: 0.7 ng/mL — ABNORMAL LOW (ref 0.8–2.0)

## 2013-05-08 LAB — POCT I-STAT TROPONIN I: Troponin i, poc: 0 ng/mL (ref 0.00–0.08)

## 2013-05-08 MED ORDER — LOSARTAN POTASSIUM 50 MG PO TABS
50.0000 mg | ORAL_TABLET | Freq: Every day | ORAL | Status: DC
  Administered 2013-05-09 – 2013-05-10 (×2): 50 mg via ORAL
  Filled 2013-05-08 (×2): qty 1

## 2013-05-08 MED ORDER — PANTOPRAZOLE SODIUM 40 MG PO TBEC
40.0000 mg | DELAYED_RELEASE_TABLET | Freq: Every day | ORAL | Status: DC
  Administered 2013-05-09 – 2013-05-10 (×2): 40 mg via ORAL
  Filled 2013-05-08 (×2): qty 1

## 2013-05-08 MED ORDER — ACETAMINOPHEN 325 MG PO TABS
650.0000 mg | ORAL_TABLET | ORAL | Status: DC | PRN
  Administered 2013-05-09: 650 mg via ORAL
  Filled 2013-05-08: qty 2

## 2013-05-08 MED ORDER — ALPRAZOLAM 0.25 MG PO TABS
0.2500 mg | ORAL_TABLET | Freq: Three times a day (TID) | ORAL | Status: DC | PRN
  Administered 2013-05-09: 0.25 mg via ORAL

## 2013-05-08 MED ORDER — ZOLPIDEM TARTRATE 5 MG PO TABS: 5.0000 mg | ORAL_TABLET | Freq: Every evening | ORAL | Status: DC | PRN

## 2013-05-08 MED ORDER — ISOSORBIDE MONONITRATE ER 30 MG PO TB24
30.0000 mg | ORAL_TABLET | Freq: Every evening | ORAL | Status: DC
  Administered 2013-05-08 – 2013-05-09 (×2): 30 mg via ORAL
  Filled 2013-05-08 (×3): qty 1

## 2013-05-08 MED ORDER — LEVOTHYROXINE SODIUM 100 MCG PO TABS
100.0000 ug | ORAL_TABLET | Freq: Every day | ORAL | Status: DC
  Administered 2013-05-09 – 2013-05-10 (×2): 100 ug via ORAL
  Filled 2013-05-08 (×3): qty 1

## 2013-05-08 MED ORDER — WARFARIN SODIUM 2.5 MG PO TABS
2.5000 mg | ORAL_TABLET | Freq: Every day | ORAL | Status: DC
  Filled 2013-05-08 (×2): qty 1

## 2013-05-08 MED ORDER — METOPROLOL SUCCINATE ER 100 MG PO TB24
100.0000 mg | ORAL_TABLET | Freq: Every day | ORAL | Status: DC
  Administered 2013-05-09 – 2013-05-10 (×2): 100 mg via ORAL
  Filled 2013-05-08 (×2): qty 1

## 2013-05-08 MED ORDER — HYDROCHLOROTHIAZIDE 25 MG PO TABS
12.5000 mg | ORAL_TABLET | ORAL | Status: DC
  Filled 2013-05-08 (×2): qty 0.5

## 2013-05-08 MED ORDER — TRAMADOL HCL 50 MG PO TABS: 25.0000 mg | ORAL_TABLET | Freq: Four times a day (QID) | ORAL | Status: DC | PRN

## 2013-05-08 MED ORDER — WARFARIN - PHYSICIAN DOSING INPATIENT
Freq: Every day | Status: DC
  Administered 2013-05-09: 18:00:00

## 2013-05-08 MED ORDER — DIGOXIN 125 MCG PO TABS
0.1250 mg | ORAL_TABLET | Freq: Every day | ORAL | Status: DC
  Administered 2013-05-09 – 2013-05-10 (×2): 0.125 mg via ORAL
  Filled 2013-05-08 (×2): qty 1

## 2013-05-08 NOTE — ED Provider Notes (Signed)
History     CSN: [id number]  Arrival date & time 05/08/13  0730   First MD Initiated Contact with Patient 05/08/13 [phone]      Chief Complaint  Patient presents with  . Chest Pain    (Consider location/radiation/quality/duration/timing/severity/associated sxs/prior treatment) HPI Comments: Patient is an 79 year old woman with a prior history of coronary artery disease, who has had 2 prior cardiac stents, and has a pacemaker. She also has atrial fibrillation. She says she started having chest pain on Sunday, 2 days ago. He was a pain that's a sharp pain that comes and goes. She rates the pain at a 3 or 4. She had a pain that woke her up this morning and therefore decided to seek evaluation. She took a half tramadol pill for the pain.  Patient is a 79 y.o. female presenting with chest pain. The history is provided by the patient and medical records. No language interpreter was used.  Chest Pain Pain location:  L lateral chest Pain quality: sharp   Pain radiates to:  Does not radiate Pain radiates to the back: no   Pain severity:  Moderate (She rates the pain at a 3 or 4.) Onset quality:  Gradual Duration:  2 days Timing:  Intermittent Progression:  Unchanged Context comment:  She is under a lot of stress, with her son who has von Willebrand's disease. Relieved by:  Nothing Exacerbated by: She took tramadol with some relief. Ineffective treatments:  None tried Associated symptoms: no palpitations   Risk factors: coronary artery disease     Past Medical History  Diagnosis Date  . Coronary artery disease   . A-fib   . Stroke   . Pacemaker   . Arthritis   . Diabetes mellitus   . Thyroid disease     Past Surgical History  Procedure Laterality Date  . Cardiac surgery    . 3 cardiac stents    . Abdominal hysterectomy    . Tonsillectomy    . Appendectomy    . Pacemaker placement      No family history on file.  History  Substance Use Topics  . Smoking status: Never  Smoker   . Smokeless tobacco: Never Used  . Alcohol Use: No    OB History   Grav Para Term Preterm Abortions TAB SAB Ect Mult Living                  Review of Systems  Constitutional: Negative.   HENT: Negative.   Eyes: Negative.   Respiratory: Negative.   Cardiovascular: Positive for chest pain. Negative for palpitations and leg swelling.  Gastrointestinal: Negative.   Genitourinary: Negative.   Musculoskeletal: Negative.   Skin: Negative.   Neurological: Negative.   Psychiatric/Behavioral: The patient is nervous/anxious.     Allergies  Iohexol; Penicillins; and Sulfur  Home Medications   Current Outpatient Rx  Name  Route  Sig  Dispense  Refill  . digoxin (LANOXIN) 0.125 MG tablet   Oral   Take 0.125 mg by mouth daily.         . hydrochlorothiazide (HYDRODIURIL) 12.5 MG tablet   Oral   Take 12.5 mg by mouth every other day.          . isosorbide mononitrate (IMDUR) 30 MG 24 hr tablet   Oral   Take 30 mg by mouth daily.         Marland Kitchen levothyroxine (SYNTHROID, LEVOTHROID) 100 MCG tablet   Oral   Take 100 mcg  by mouth daily.         Marland Kitchen losartan (COZAAR) 50 MG tablet   Oral   Take 50 mg by mouth daily. Pt. Called in today stated she couldn't afford diovan 40 mg . Dr. Alanda Amass informed and med was changed to losartan 50 mg daily.         . metoprolol succinate (TOPROL-XL) 100 MG 24 hr tablet   Oral   Take by mouth daily. Take 100 mg daily starting 04-16-2013         . docusate sodium (COLACE) 100 MG capsule   Oral   Take 1 capsule (100 mg total) by mouth at bedtime.   10 capsule   0   . mupirocin ointment (BACTROBAN) 2 %   Topical   Apply topically 3 (three) times daily.   22 g   0   . omeprazole (PRILOSEC) 40 MG capsule   Oral   Take 40 mg by mouth daily.         Marland Kitchen POTASSIUM PO   Oral   Take 1 tablet by mouth daily as needed (for muscle cramps).         . Pramoxine-Menthol-Petrolatum 1-0.5-30 % CREA   Apply externally   Apply 1  application topically 2 (two) times daily as needed.   1 Tube   0   . traMADol (ULTRAM) 50 MG tablet   Oral   Take 25-50 mg by mouth every 6 (six) hours as needed for pain (for pain).         . triamcinolone ointment (KENALOG) 0.5 %   Topical   Apply topically 2 (two) times daily.   30 g   0   . valsartan (DIOVAN) 40 MG tablet   Oral   Take 40 mg by mouth daily.           BP 143/69  Pulse 66  Temp(Src) 98.1 F (36.7 C) (Oral)  Resp 20  Wt 169 lb (76.658 kg)  BMI 24.95 kg/m2  SpO2 97%  Physical Exam  Nursing note and vitals reviewed. Constitutional: She is oriented to person, place, and time.  Elderly lady, looks than her stated age, in mild distress complaining of chest pain.  HENT:  Head: Normocephalic and atraumatic.  Right Ear: External ear normal.  Left Ear: External ear normal.  Mouth/Throat: Oropharynx is clear and moist.  Eyes: Conjunctivae and EOM are normal. Pupils are equal, round, and reactive to light.  Neck: Normal range of motion. Neck supple.  Cardiovascular: Normal heart sounds.   Irregular heart rhythm.  Pulmonary/Chest: Effort normal and breath sounds normal.  Pacemaker on left upper chest.  Abdominal: Soft. Bowel sounds are normal.  Musculoskeletal: Normal range of motion. She exhibits no edema and no tenderness.  Neurological: She is alert and oriented to person, place, and time.  No sensory or motor deficit.  Skin: Skin is warm and dry.  Psychiatric: She has a normal mood and affect. Her behavior is normal.    ED Course  Procedures (including critical care time)  Labs Reviewed  CBC  BASIC METABOLIC PANEL   1:61 AM  Date: 05/08/2013  Rate: 63  Rhythm: atrial fibrillation  QRS Axis: right  Intervals: normal  ST/T Wave abnormalities: normal  Conduction Disutrbances:  Incomplete right bundle branch block.  Narrative Interpretation: Abnormal EKG  Old EKG Reviewed: unchanged  8:22 AM Pt was seen and had physical examination.   Lab workup was ordered.  Old charts were reviewed.  9:46 AM  Results for orders placed during the hospital encounter of 05/08/13  CBC      Result Value Range   WBC 7.5  4.0 - 10.5 K/uL   RBC 4.92  3.87 - 5.11 MIL/uL   Hemoglobin 14.1  12.0 - 15.0 g/dL   HCT 16.1  09.6 - 04.5 %   MCV 87.4  78.0 - 100.0 fL   MCH 28.7  26.0 - 34.0 pg   MCHC 32.8  30.0 - 36.0 g/dL   RDW 40.9  81.1 - 91.4 %   Platelets 208  150 - 400 K/uL  BASIC METABOLIC PANEL      Result Value Range   Sodium 139  135 - 145 mEq/L   Potassium 4.2  3.5 - 5.1 mEq/L   Chloride 101  96 - 112 mEq/L   CO2 30  19 - 32 mEq/L   Glucose, Bld 146 (*) 70 - 99 mg/dL   BUN 23  6 - 23 mg/dL   Creatinine, Ser 7.82  0.50 - 1.10 mg/dL   Calcium 9.2  8.4 - 95.6 mg/dL   GFR calc non Af Amer 84 (*) >90 mL/min   GFR calc Af Amer >90  >90 mL/min  PROTIME-INR      Result Value Range   Prothrombin Time 29.7 (*) 11.6 - 15.2 seconds   INR 3.02 (*) 0.00 - 1.49  APTT      Result Value Range   aPTT 47 (*) 24 - 37 seconds  DIGOXIN LEVEL      Result Value Range   Digoxin Level 0.7 (*) 0.8 - 2.0 ng/mL  POCT I-STAT TROPONIN I      Result Value Range   Troponin i, poc 0.00  0.00 - 0.08 ng/mL   Comment 3            Dg Chest 2 View  05/08/2013   *RADIOLOGY REPORT*  Clinical Data: Chest pain.  CHEST - 2 VIEW  Comparison: Chest x-ray 01/09/2013.  Findings: Lung volumes are normal.  No consolidative airspace disease.  No pleural effusions.  No pneumothorax.  No pulmonary nodule or mass noted.  Pulmonary vasculature and the cardiomediastinal silhouette are within normal limits.  Prominent nipple shadow in the lower right hemithorax.  Left-sided pacemaker in place with lead tip projecting over the expected location of the right ventricle.  IMPRESSION: 1. No radiographic evidence of acute cardiopulmonary disease.   Original Report Authenticated By: Trudie Reed, M.D.   9:46 AM Lab workup is negative.  Results reviewed with pt. Call to [facility] to see her.  9:57 AM Case discussed with Corine Shelter, P.A.-C. For [facility]. They will see and admit pt.   1. Chest pain   2. Atrial fibrillation             Carleene Cooper III, MD 05/08/13 [phone]

## 2013-05-08 NOTE — ED Notes (Signed)
Floor unable to take report at the time. To call back. 

## 2013-05-08 NOTE — Progress Notes (Signed)
Patient ID: [name] MRN: [id number], DOB/AGE: Apr 09, 1930   Admit date: 05/08/2013   Primary Physician: Azalee Course, CMA Primary Cardiologist: Dr Tresa Endo  HPI: The patient is an 79 year old, married, white mother of 5 with 5 grandchildren and 2 great-grandchildren. She has never been a smoker. She has a known history of coronary disease, permanent pacemaker and sick sinus syndrome with chronic atrial fibrillation, on chronic anticoagulation.           From a coronary standpoint, she has had non-DES stent to the mid circumflex in 2009 and LAD stent to the mid-distal LAD BMS June 18, 2010. She has had a negative Myoview followup for ischemia in October 2012.        She has known LV dysfunction associated with her chronic atrial fibrillation but no overt heart failure. Her last echo was November 2013 which showed an EF of 35% to 40% with no significant valve disease except for mild to moderate MR. Normal RVSP and left atrium was severely dilated.          Her pacemaker is a Medtronic Adapta implanted August 11, 2010, when she presented with atrial fibrillation.          She presents to the ER today at [facility] with complaints of Lt sided chest pain. This woke her up about 4am but she admits she had some Sunday as well. It seems to be localized but is not sharp. She says when she presented with [place] in the past she also had atypical symptoms and this episode feels similar to past episodes. She denies associated diaphoresis but admits to some associated nausea. She says she has been under a great deal of stress at home secondary to her sons illness, he apparently required hospitalization.   Problem List: Past Medical History  Diagnosis Date  . Coronary artery disease   . A-fib   . Stroke   . Pacemaker   . Arthritis   . Diabetes mellitus   . Thyroid disease     Past Surgical History  Procedure Laterality Date  . Cardiac surgery    . 3 cardiac stents    . Abdominal hysterectomy    .  Tonsillectomy    . Appendectomy    . Pacemaker placement       Allergies:  Allergies  Allergen Reactions  . Iohexol Other (See Comments)     Desc: CHEST PAIN, LOC AFTER HEART CATH, PT REFUSED DYE   . Penicillins Other (See Comments)    REACTION:  unknown  . Sulfur Other (See Comments)    REACTION:  unknown     Home Medications No current facility-administered medications for this encounter.   Current Outpatient Prescriptions  Medication Sig Dispense Refill  . bag balm OINT ointment Apply 1 application topically daily as needed.      . digoxin (LANOXIN) 0.125 MG tablet Take 0.125 mg by mouth daily.      . hydrochlorothiazide (HYDRODIURIL) 12.5 MG tablet Take 12.5 mg by mouth every other day.       . isosorbide mononitrate (IMDUR) 30 MG 24 hr tablet Take 30 mg by mouth every evening.       Marland Kitchen levothyroxine (SYNTHROID, LEVOTHROID) 100 MCG tablet Take 100 mcg by mouth daily.      Marland Kitchen losartan (COZAAR) 50 MG tablet Take 50 mg by mouth daily.       . metoprolol succinate (TOPROL-XL) 100 MG 24 hr tablet Take 100 mg by mouth daily.       Marland Kitchen  omeprazole (PRILOSEC) 40 MG capsule Take 40 mg by mouth daily.      Marland Kitchen POTASSIUM PO Take 1 tablet by mouth daily as needed (for muscle cramps).      . traMADol (ULTRAM) 50 MG tablet Take 25-50 mg by mouth every 6 (six) hours as needed for pain.       Marland Kitchen warfarin (COUMADIN) 2.5 MG tablet Take 2.5 mg by mouth at bedtime.         No family history on file.   History   Social History  . Marital Status: Married    Spouse Name: N/A    Number of Children: N/A  . Years of Education: N/A   Occupational History  . Not on file.   Social History Main Topics  . Smoking status: Never Smoker   . Smokeless tobacco: Never Used  . Alcohol Use: No  . Drug Use: No  . Sexually Active: Not on file   Other Topics Concern  . Not on file   Social History Narrative  . No narrative on file     Review of Systems: General: negative for chills, fever, night  sweats or weight changes. She has a history of remote Polio without residual. Cardiovascular: negative for chest pain, dyspnea on exertion, edema, orthopnea, palpitations, paroxysmal nocturnal dyspnea or shortness of breath Dermatological: negative for rash Respiratory: negative for cough or wheezing Urologic: negative for hematuria Abdominal: negative for nausea, vomiting, diarrhea, bright red blood per rectum, melena, or hematemesis. She has been followed by Dr Karilyn Cota for diverticulitis. Neurologic: negative for visual changes, syncope, or dizziness All other systems reviewed and are otherwise negative except as noted above.  Physical Exam: Blood pressure 144/72, pulse 60, temperature 98.1 F (36.7 C), temperature source Oral, resp. rate 18, weight 169 lb (76.658 kg), SpO2 98.00%.  General appearance: alert, cooperative and no distress Neck: no carotid bruit, no JVD, supple, symmetrical, trachea midline and thyroid not enlarged, symmetric, no tenderness/mass/nodules Lungs: clear to auscultation bilaterally Heart: irregularly irregular rhythm Abdomen: soft, non-tender; bowel sounds normal; no masses,  no organomegaly Extremities: extremities normal, atraumatic, no cyanosis or edema Pulses: 2+ and symmetric Skin: Skin color, texture, turgor normal. No rashes or lesions Neurologic: Grossly normal    Labs:   Results for orders placed during the hospital encounter of 05/08/13 (from the past 24 hour(s))  CBC     Status: None   Collection Time    05/08/13  7:57 AM      Result Value Range   WBC 7.5  4.0 - 10.5 K/uL   RBC 4.92  3.87 - 5.11 MIL/uL   Hemoglobin 14.1  12.0 - 15.0 g/dL   HCT 56.2  13.0 - 86.5 %   MCV 87.4  78.0 - 100.0 fL   MCH 28.7  26.0 - 34.0 pg   MCHC 32.8  30.0 - 36.0 g/dL   RDW 78.4  69.6 - 29.5 %   Platelets 208  150 - 400 K/uL  BASIC METABOLIC PANEL     Status: Abnormal   Collection Time    05/08/13  7:57 AM      Result Value Range   Sodium 139  135 - 145  mEq/L   Potassium 4.2  3.5 - 5.1 mEq/L   Chloride 101  96 - 112 mEq/L   CO2 30  19 - 32 mEq/L   Glucose, Bld 146 (*) 70 - 99 mg/dL   BUN 23  6 - 23 mg/dL   Creatinine, Ser 2.84  0.50 - 1.10 mg/dL   Calcium 9.2  8.4 - 19.1 mg/dL   GFR calc non Af Amer 84 (*) >90 mL/min   GFR calc Af Amer >90  >90 mL/min  POCT I-STAT TROPONIN I     Status: None   Collection Time    05/08/13  8:07 AM      Result Value Range   Troponin i, poc 0.00  0.00 - 0.08 ng/mL   Comment 3           PROTIME-INR     Status: Abnormal   Collection Time    05/08/13  8:29 AM      Result Value Range   Prothrombin Time 29.7 (*) 11.6 - 15.2 seconds   INR 3.02 (*) 0.00 - 1.49  APTT     Status: Abnormal   Collection Time    05/08/13  8:29 AM      Result Value Range   aPTT 47 (*) 24 - 37 seconds  DIGOXIN LEVEL     Status: Abnormal   Collection Time    05/08/13  8:29 AM      Result Value Range   Digoxin Level 0.7 (*) 0.8 - 2.0 ng/mL     Radiology/Studies: Dg Chest 2 View  05/08/2013   *RADIOLOGY REPORT*  Clinical Data: Chest pain.  CHEST - 2 VIEW  Comparison: Chest x-ray 01/09/2013.  Findings: Lung volumes are normal.  No consolidative airspace disease.  No pleural effusions.  No pneumothorax.  No pulmonary nodule or mass noted.  Pulmonary vasculature and the cardiomediastinal silhouette are within normal limits.  Prominent nipple shadow in the lower right hemithorax.  Left-sided pacemaker in place with lead tip projecting over the expected location of the right ventricle.  IMPRESSION: 1. No radiographic evidence of acute cardiopulmonary disease.   Original Report Authenticated By: Trudie Reed, M.D.    EKG:AF, PVCs, incomplete RBBB  ASSESSMENT AND PLAN:  Principal Problem:   Unstable angina Active Problems:   CAD, non DES to CFX '09, LAD BMS 7/11. Myoview low risk 10/12   Long term (current) use of anticoagulants   Cardiomyopathy, suspected to be secondary to AF, not CAD. EF 35-40% on 11/13 echo   Situational  stress   Chronic atrial fibrillation   GERD (gastroesophageal reflux disease)   Hypothyroidism   SSS (sick sinus syndrome)- MDT PTVDP 9/11   PLAN: Cycle enzymes, hold Coumadin tonight. Plan on Myoview if she rules out,  ? OP vs IP, I'll make her NPO.   Deland Pretty, PA-C 05/08/2013, 11:01 AM

## 2013-05-08 NOTE — ED Notes (Signed)
Cardiology at the bedside.

## 2013-05-08 NOTE — ED Notes (Signed)
PT is here with little pains under left breast and into left chest.  Pt has history of MI.  Pt states she did have some sob.

## 2013-05-08 NOTE — ED Notes (Signed)
Pt resting quietly at the time. Vital signs stable. Denies chest pain. Family at bedside. Pt remains alert and oriented x4.

## 2013-05-08 NOTE — ED Notes (Signed)
Pt states L sided chest pain and SOB. Onset this morning. Denies pain at the time. Pt states she was concerned because it felt similar to last MI. Respirations unlabored. Skin warm and dry. She is alert and oriented x4.

## 2013-05-08 NOTE — Progress Notes (Signed)
PHARMACIST - PHYSICIAN COMMUNICATION DR:  Allyson Sabal CONCERNING: Pharmacy Care Issues Regarding Warfarin Labs  RECOMMENDATION (Action Taken): A baseline and daily protime for three days has been ordered to meet the [facility] Patient safety goal and comply with the current [facility] Pharmacy & Therapeutics Committee policy.   The Pharmacy will defer all warfarin dose order changes and follow up of lab results to the prescriber unless an additional order to initiate a "pharmacy Coumadin consult" is placed.  DESCRIPTION:  While hospitalized, to be in compliance with The Joint Commission National Patient Safety Goals, all patients on warfarin must have a baseline and/or current protime prior to the administration of warfarin. Pharmacy has received your order for warfarin without these required laboratory assessments.  [place], [address].D., BCPS Clinical Pharmacist Pager: [phone] 05/08/2013 12:13 PM

## 2013-05-09 ENCOUNTER — Inpatient Hospital Stay (HOSPITAL_COMMUNITY): Payer: Medicare Other

## 2013-05-09 DIAGNOSIS — R079 Chest pain, unspecified: Secondary | ICD-10-CM

## 2013-05-09 DIAGNOSIS — I2510 Atherosclerotic heart disease of native coronary artery without angina pectoris: Principal | ICD-10-CM

## 2013-05-09 LAB — URINALYSIS, ROUTINE W REFLEX MICROSCOPIC
Bilirubin Urine: NEGATIVE
Glucose, UA: NEGATIVE mg/dL
Hgb urine dipstick: NEGATIVE
Ketones, ur: NEGATIVE mg/dL
Protein, ur: NEGATIVE mg/dL

## 2013-05-09 LAB — PROTIME-INR: Prothrombin Time: 27.5 seconds — ABNORMAL HIGH (ref 11.6–15.2)

## 2013-05-09 MED ORDER — TECHNETIUM TC 99M SESTAMIBI GENERIC - CARDIOLITE
30.0000 | Freq: Once | INTRAVENOUS | Status: AC | PRN
  Administered 2013-05-09: 30 via INTRAVENOUS

## 2013-05-09 MED ORDER — REGADENOSON 0.4 MG/5ML IV SOLN
INTRAVENOUS | Status: AC
  Administered 2013-05-09: 0.4 mg via INTRAVENOUS
  Filled 2013-05-09: qty 5

## 2013-05-09 MED ORDER — ALPRAZOLAM 0.25 MG PO TABS
ORAL_TABLET | ORAL | Status: AC
  Filled 2013-05-09: qty 1

## 2013-05-09 MED ORDER — REGADENOSON 0.4 MG/5ML IV SOLN
0.4000 mg | Freq: Once | INTRAVENOUS | Status: AC
  Filled 2013-05-09: qty 5

## 2013-05-09 MED ORDER — SODIUM CHLORIDE 0.9 % IJ SOLN
80.0000 mg | INTRAVENOUS | Status: DC
  Filled 2013-05-09: qty 3.2

## 2013-05-09 MED ORDER — TECHNETIUM TC 99M SESTAMIBI GENERIC - CARDIOLITE
10.0000 | Freq: Once | INTRAVENOUS | Status: AC | PRN
  Administered 2013-05-09: 10 via INTRAVENOUS

## 2013-05-09 NOTE — Progress Notes (Signed)
Spoke to Dr. Tresa Endo regarding stress test results and coumadin. Order given to hold coumadin today. Will make decision regarding care in Am. [name]

## 2013-05-09 NOTE — Care Management Note (Unsigned)
    Page 1 of 1   05/09/2013     4:35:08 PM   CARE MANAGEMENT NOTE 05/09/2013  Patient:  [name], [name]   Account Number:  [id number]  Date Initiated:  05/09/2013  Documentation initiated by:  Cecile Guevara  Subjective/Objective Assessment:   PT ADM WITH [place].  PTA, PT RESIDES AT HOME WITH HUSBAND.     Action/Plan:   WILL FOLLOW FOR DC NEEDS AS PT PROGRESSES   Anticipated DC Date:  05/11/2013   Anticipated DC Plan:  HOME/SELF CARE      DC Planning Services  CM consult      Choice offered to / List presented to:             Status of service:  In process, will continue to follow Medicare Important Message given?   (If response is "NO", the following Medicare IM given date fields will be blank) Date Medicare IM given:   Date Additional Medicare IM given:    Discharge Disposition:    Per UR Regulation:  Reviewed for med. necessity/level of care/duration of stay  If discussed at Long Length of Stay Meetings, dates discussed:    Comments:

## 2013-05-09 NOTE — Progress Notes (Signed)
Patient had a night sweat and felt hot.  Checked her temperature and it was 97.8. Had patient remove her pants that were lined because she was sleeping in them. Will continue to monitor.

## 2013-05-09 NOTE — Progress Notes (Signed)
[facility]  Subjective: No further chest pain. Endorses urinary frequency and strong odor. She had mild RLQ pain earlier this am. No pain currently.   Objective: Vital signs in last 24 hours: Temp:  [97.5 F (36.4 C)-97.8 F (36.6 C)] 97.6 F (36.4 C) (06/11 0452) Pulse Rate:  [60-62] 61 (06/11 0452) Resp:  [17-18] 18 (06/11 0452) BP: (133-162)/(65-82) 162/82 mmHg (06/11 0452) SpO2:  [97 %-98 %] 97 % (06/11 0452) Last BM Date: 05/09/13  Intake/Output from previous day: 06/10 0701 - 06/11 0700 In: -  Out: 800 [Urine:800] Intake/Output this shift: Total I/O In: 0  Out: 550 [Urine:550]  Medications Current Facility-Administered Medications  Medication Dose Route Frequency Provider Last Rate Last Dose  . acetaminophen (TYLENOL) tablet 650 mg  650 mg Oral Q4H PRN Abelino Derrick, PA-C      . ALPRAZolam Prudy Feeler) tablet 0.25 mg  0.25 mg Oral TID PRN Abelino Derrick, PA-C      . digoxin (LANOXIN) tablet 0.125 mg  0.125 mg Oral Daily Luke K Kilroy, PA-C      . hydrochlorothiazide (HYDRODIURIL) tablet 12.5 mg  12.5 mg Oral QODAY Luke K Kilroy, PA-C      . isosorbide mononitrate (IMDUR) 24 hr tablet 30 mg  30 mg Oral QPM Eda Paschal Kilroy, PA-C   30 mg at 05/08/13 1657  . levothyroxine (SYNTHROID, LEVOTHROID) tablet 100 mcg  100 mcg Oral QAC breakfast Luke K Kilroy, PA-C      . losartan (COZAAR) tablet 50 mg  50 mg Oral Daily Luke K Kilroy, PA-C      . metoprolol succinate (TOPROL-XL) 24 hr tablet 100 mg  100 mg Oral Daily Luke K Kilroy, PA-C      . pantoprazole (PROTONIX) EC tablet 40 mg  40 mg Oral Daily Luke K Kilroy, PA-C      . regadenoson (LEXISCAN) injection SOLN 0.4 mg  0.4 mg Intravenous Once Brittainy Simmons, PA-C      . traMADol (ULTRAM) tablet 25-50 mg  25-50 mg Oral Q6H PRN Abelino Derrick, PA-C      . warfarin (COUMADIN) tablet 2.5 mg  2.5 mg Oral q1800 Eda Paschal [place], PA-C      . Warfarin - Physician Dosing Inpatient   Does not apply q1800 [facility], RPH      . zolpidem (AMBIEN) tablet 5 mg  5 mg Oral QHS PRN Abelino Derrick, PA-C        PE: General appearance: alert, cooperative and no distress Lungs: clear to auscultation bilaterally Heart: regular rate and rhythm Abdomen: soft, non-tender; bowel sounds normal; no masses,  no organomegaly Back: No CVA tenderness Extremities: no LEE Pulses: 2+ and symmetric Skin: warm and dry Neurologic: Grossly normal  Lab Results:   Recent Labs  05/08/13 0757  WBC 7.5  HGB 14.1  HCT 43.0  PLT 208   BMET  Recent Labs  05/08/13 0757  NA 139  K 4.2  CL 101  CO2 30  GLUCOSE 146*  BUN 23  CREATININE 0.59  CALCIUM 9.2   PT/INR  Recent Labs  05/08/13 0829 05/09/13 0445  LABPROT 29.7* 27.5*  INR 3.02* 2.72*   Cardiac Panel (last 3 results)  Recent Labs  05/08/13 1107 05/08/13 1842  TROPONINI <0.30 <0.30   Assessment/Plan  Principal Problem:   Unstable angina Active Problems:   CAD, non DES to CFX '09, LAD BMS 7/11. Myoview low risk 10/12   Chronic atrial fibrillation  GERD (gastroesophageal reflux disease)   Hypothyroidism   Long term (current) use of anticoagulants   SSS (sick sinus syndrome)- MDT PTVDP 9/11   Cardiomyopathy, suspected to be secondary to AF, not CAD. EF 35-40% on 11/13 echo   Situational stress  Plan: troponin negative x 2. No further chest pain. Plan for NST for risk stratification. Pt endorsed urinary frequency and strong odor, with mild RLQ pain earlier this am. She denies dysuria. No back or flank pain. No CVA tenderness. Will order U/A.     LOS: 1 day    Brittainy M. Sharol Harness, PA-C 05/09/2013 10:40 AM  I have seen and evaluated the patient this AM along with Boyce Medici, PA. I agree with her findings, examination as well as impression recommendations.  I saw her prior to completing her Lexiscan Cardiolite -- was relatively stable.  See my H&P addendum for details.  MD Time with pt: 5 min  Dayona Shaheen W,  M.D., M.S. [facility] [address] [place], [place]  [address]  [phone] Pager # [phone] 05/09/2013 1:36 PM

## 2013-05-09 NOTE — Progress Notes (Signed)
I saw & examined the patient the following AM prior to her Lexiscan Myoview.  I agree with the H&P by Corine Shelter, PA.  I agree with his findings, examination as well as impression recommendations.  Known CAD with prior PCI & chronic Afib.  She noted having CP on Sunday & Monday that is somewhat atypical in nature.   I agree that with her history, non-invasive evaluation for confirmation is warranted.  If Abnormal, would require stopping warfarin.  On BB & ARB.    MD Time with pt: 10 min  [place] W, M.D., M.S. [facility] [address] [place], [place]  [address]  [phone] Pager # [phone] 05/09/2013 12:20 PM

## 2013-05-10 ENCOUNTER — Encounter (HOSPITAL_COMMUNITY)
Admission: EM | Disposition: A | Discharge status: Home / Self Care | Payer: Self-pay | Source: Home / Self Care | Attending: Cardiovascular Disease

## 2013-05-10 DIAGNOSIS — I4891 Unspecified atrial fibrillation: Secondary | ICD-10-CM

## 2013-05-10 DIAGNOSIS — I2510 Atherosclerotic heart disease of native coronary artery without angina pectoris: Secondary | ICD-10-CM

## 2013-05-10 HISTORY — PX: LEFT HEART CATHETERIZATION WITH CORONARY ANGIOGRAM: SHX5451

## 2013-05-10 LAB — PROTIME-INR: Prothrombin Time: 18.8 seconds — ABNORMAL HIGH (ref 11.6–15.2)

## 2013-05-10 LAB — BASIC METABOLIC PANEL
BUN: 19 mg/dL (ref 6–23)
CO2: 30 mEq/L (ref 19–32)
Chloride: 101 mEq/L (ref 96–112)
Creatinine, Ser: 0.58 mg/dL (ref 0.50–1.10)
GFR calc Af Amer: >90 mL/min (ref >90)
Potassium: 4.1 mEq/L (ref 3.5–5.1)

## 2013-05-10 SURGERY — LEFT HEART CATHETERIZATION WITH CORONARY ANGIOGRAM
Anesthesia: LOCAL

## 2013-05-10 MED ORDER — SODIUM CHLORIDE 0.9 % IJ SOLN: 3.0000 mL | INTRAMUSCULAR | Status: DC | PRN

## 2013-05-10 MED ORDER — ACETAMINOPHEN 325 MG PO TABS: 650.0000 mg | ORAL_TABLET | ORAL | Status: DC | PRN

## 2013-05-10 MED ORDER — METHYLPREDNISOLONE SODIUM SUCC 125 MG IJ SOLR
125.0000 mg | INTRAMUSCULAR | Status: AC
  Administered 2013-05-10: 125 mg via INTRAVENOUS
  Filled 2013-05-10 (×2): qty 2

## 2013-05-10 MED ORDER — LIDOCAINE HCL (PF) 1 % IJ SOLN
INTRAMUSCULAR | Status: AC
  Filled 2013-05-10: qty 30

## 2013-05-10 MED ORDER — DIPHENHYDRAMINE HCL 50 MG/ML IJ SOLN
25.0000 mg | INTRAMUSCULAR | Status: AC
  Administered 2013-05-10: 25 mg via INTRAVENOUS
  Filled 2013-05-10: qty 1

## 2013-05-10 MED ORDER — HEPARIN (PORCINE) IN NACL 2-0.9 UNIT/ML-% IJ SOLN
INTRAMUSCULAR | Status: AC
  Filled 2013-05-10: qty 1000

## 2013-05-10 MED ORDER — VERAPAMIL HCL 2.5 MG/ML IV SOLN
INTRAVENOUS | Status: AC
  Filled 2013-05-10: qty 2

## 2013-05-10 MED ORDER — SODIUM CHLORIDE 0.9 % IJ SOLN
3.0000 mL | Freq: Two times a day (BID) | INTRAMUSCULAR | Status: DC
  Administered 2013-05-10: 3 mL via INTRAVENOUS

## 2013-05-10 MED ORDER — SODIUM CHLORIDE 0.9 % IV SOLN: 250.0000 mL | INTRAVENOUS | Status: DC | PRN

## 2013-05-10 MED ORDER — ASPIRIN 81 MG PO CHEW: 324.0000 mg | CHEWABLE_TABLET | ORAL | Status: DC

## 2013-05-10 MED ORDER — ONDANSETRON HCL 4 MG/2ML IJ SOLN: 4.0000 mg | Freq: Four times a day (QID) | INTRAMUSCULAR | Status: DC | PRN

## 2013-05-10 MED ORDER — SODIUM CHLORIDE 0.9 % IV SOLN
INTRAVENOUS | Status: DC
  Administered 2013-05-10: 11:00:00 via INTRAVENOUS

## 2013-05-10 MED ORDER — FENTANYL CITRATE 0.05 MG/ML IJ SOLN
INTRAMUSCULAR | Status: AC
  Filled 2013-05-10: qty 2

## 2013-05-10 MED ORDER — NITROGLYCERIN 0.2 MG/ML ON CALL CATH LAB
INTRAVENOUS | Status: AC
  Filled 2013-05-10: qty 1

## 2013-05-10 MED ORDER — SODIUM CHLORIDE 0.9 % IV SOLN: INTRAVENOUS | Status: DC

## 2013-05-10 MED ORDER — FAMOTIDINE IN NACL 20-0.9 MG/50ML-% IV SOLN
20.0000 mg | INTRAVENOUS | Status: AC
  Administered 2013-05-10: 20 mg via INTRAVENOUS
  Filled 2013-05-10: qty 50

## 2013-05-10 MED ORDER — MIDAZOLAM HCL 2 MG/2ML IJ SOLN
INTRAMUSCULAR | Status: AC
  Filled 2013-05-10: qty 2

## 2013-05-10 NOTE — CV Procedure (Signed)
[name] is a 79 y.o. female    [id number]  [id number] LOCATION:  FACILITY: [facility]  PHYSICIAN: Lennette Bihari, MD, [facility]:        Bryan Lemma, MD,, [facility] 1929/12/01   DATE OF PROCEDURE:  05/10/2013    CARDIAC CATHETERIZATION     HISTORY: [name] is an 79 year old white female with known coronary artery disease. In 2009 she underwent stenting of her circumflex coronary artery in 2011 underwent bare-metal stenting to her mid LAD. She is status post permanent pacemaker insertion. She recently has been experiencing intermittent episodes of somewhat atypical chest pain. She was admitted to [facility] with chest pain the she underwent a nuclear perfusion study which raised the possibility of distal apical anterior ischemia. Her Coumadin has been held INR this morning was 1.82 and repeat at noon was 1.6. She now presents for definitive diagnostic cardiac catheterization via the right radial artery approach.   PROCEDURE:  The patient was brought to the cardiac catheterization in the postabsorptive state. A modified Allen's test performed on the right wrist demonstrated adequate ulnar artery flow. Her right arm was prepped and draped in usual fashion. 2 mg of Versed and 50 mcg of fentanyl were administered for conscious sedation. 1-2 cc of lidocaine was administered in the region of the right radial artery and the 5 [place] Angiocath micropuncture kit was utilized for the procedure. Once access was obtained a radial cocktail with verapamil, nitroglycerin and lidocaine was administered . A Terumo Tig 4.0 diagnostic catheter was then advanced over a Versacore wire and was advanced under fluoroscopic guidance into the ascending aorta. Selective angiography in the left coronary artery and right coronary artery  were performed. A long exchange safety J wire was then used and a 5 French angled pigtail catheter was advanced into the left ventricle. RAO ventriculography was performed. An LV  AO pullback was performed. Catheters were then removed from the patient. A radial  TR band was then applied at 15:24 at 14 cc with excellent hemostasis. The patient tolerated the procedure well. Plans are for hospital discharge later today   HEMODYNAMIC DATA:   AO SYSTOLIC/AO DIASTOLIC: 135/62   LV SYSTOLIC/LV DIASTOLIC: 135/10   ANGIOGRAPHY:  The left main coronary artery bifurcated into the LAD and left circumflex vessel and was free of significant disease.  The LAD had mild smooth narrowing of 30% prior to an ectatic proximal segment. The first diagonal vessel had 40% ostial narrowing. The mid LAD stent was widely patent. The distal LAD was small caliber and free of significant disease.  The left circumflex coronary artery had 20% ostial narrowing and in this region there was a very small marginal vessel with a  smooth narrowing ostially of 60-70%. The stent in the circumflex vessel was patent and have smooth 20 to less than 30% intimal hyperplasia.  The right current artery was a large dominant vessel that had 20% areas of irregularity in its proximal to mid segment. The vessel supplied a large PDA and PLA system was applied the inferior wall almost towards the apex.  RAO left ventriculography revealed an ejection fraction of approximately 55%. There were no focal segmental wall motion abnormalities. There was evidence for angiographic mitral valve prolapse with 1+ mitral regurgitation.  IMPRESSION:  Mild coronary obstructive disease with widely patent mid LAD stent, 40% ostial first diagonal stenosis, patent stent in the circumflex vessel with 20-30% intimal hyperplasia, 20% irregularity in a dominant right carotid artery. Mild mitral valve prolapse  with 1+ angiographic mitral regurgitation and normal systolic function.  RECOMMENDATION: Medical therapy.   Lennette Bihari, MD, [facility] 05/10/2013 4:09 PM

## 2013-05-10 NOTE — Progress Notes (Signed)
Patient refused groin prep. "I do not want him to go through my groin d/t previous bleeding."

## 2013-05-10 NOTE — Progress Notes (Signed)
[facility]  Subjective: No further chest pain.  Objective: Vital signs in last 24 hours: Temp:  [97.6 F (36.4 C)-97.9 F (36.6 C)] 97.6 F (36.4 C) (06/12 0519) Pulse Rate:  [60-71] 63 (06/12 0519) Resp:  [18] 18 (06/12 0519) BP: (115-176)/(61-92) 133/74 mmHg (06/12 0519) SpO2:  [97 %-99 %] 98 % (06/12 0519) Last BM Date: 05/09/13  Intake/Output from previous day: 06/11 0701 - 06/12 0700 In: 0  Out: 1250 [Urine:1250] Intake/Output this shift:    Medications Current Facility-Administered Medications  Medication Dose Route Frequency Provider Last Rate Last Dose  . acetaminophen (TYLENOL) tablet 650 mg  650 mg Oral Q4H PRN Abelino Derrick, PA-C   650 mg at 05/09/13 1330  . ALPRAZolam Prudy Feeler) tablet 0.25 mg  0.25 mg Oral TID PRN Abelino Derrick, PA-C   0.25 mg at 05/09/13 1058  . aminophylline 80 mg in ns 20 mL (4 mg/mL) NUC MED syringe  80 mg Intravenous to XRAY Runell Gess, MD      . digoxin [facility]) tablet 0.125 mg  0.125 mg Oral Daily Eda Paschal Kilroy, PA-C   0.125 mg at 05/09/13 1321  . hydrochlorothiazide (HYDRODIURIL) tablet 12.5 mg  12.5 mg Oral QODAY Luke K Kilroy, PA-C      . isosorbide mononitrate (IMDUR) 24 hr tablet 30 mg  30 mg Oral QPM Eda Paschal Kilroy, PA-C   30 mg at 05/09/13 1816  . levothyroxine (SYNTHROID, LEVOTHROID) tablet 100 mcg  100 mcg Oral QAC breakfast Abelino Derrick, PA-C   100 mcg at 05/10/13 0505  . losartan (COZAAR) tablet 50 mg  50 mg Oral Daily Abelino Derrick, PA-C   50 mg at 05/09/13 1321  . metoprolol succinate (TOPROL-XL) 24 hr tablet 100 mg  100 mg Oral Daily Abelino Derrick, PA-C   100 mg at 05/09/13 1322  . pantoprazole (PROTONIX) EC tablet 40 mg  40 mg Oral Daily Abelino Derrick, PA-C   40 mg at 05/09/13 1330  . traMADol (ULTRAM) tablet 25-50 mg  25-50 mg Oral Q6H PRN Abelino Derrick, PA-C      . warfarin (COUMADIN) tablet 2.5 mg  2.5 mg Oral q1800 Eda Paschal [place], PA-C      . Warfarin - Physician Dosing Inpatient   Does  not apply q1800 [facility]      . zolpidem [facility]) tablet 5 mg  5 mg Oral QHS PRN Abelino Derrick, PA-C        PE: General appearance: alert, cooperative and no distress Lungs: clear to auscultation bilaterally Heart: irregularly irregular rhythm Extremities: no LEE Pulses: 2+ and symmetric Skin: warm and dry Neurologic: Grossly normal  Lab Results:   Recent Labs  05/08/13 0757  WBC 7.5  HGB 14.1  HCT 43.0  PLT 208   BMET  Recent Labs  05/08/13 0757  NA 139  K 4.2  CL 101  CO2 30  GLUCOSE 146*  BUN 23  CREATININE 0.59  CALCIUM 9.2   PT/INR  Recent Labs  05/08/13 0829 05/09/13 0445 05/10/13 0420  LABPROT 29.7* 27.5* 20.4*  INR 3.02* 2.72* 1.82*    Cardiac Panel (last 3 results)  Recent Labs  05/08/13 1107 05/08/13 1842  TROPONINI <0.30 <0.30    Studies/Results:  NST 05/09/13 Findings:  Technique: Study is adequate.  Perfusion: There is mild decreased counts within the mid and apical  segment of the anterior wall which improve from stress to rest.  This is a small moderate defect. No additional evidence of  infarction or ischemia.  Wall motion: Hypokinesia of the septal wall.  Left ventricular ejection fraction: Calculated left ventricular  ejection fraction = 52%.  End- -diastolic volume equals 83 ml  End- systolic volume equals 40 ml  IMPRESSION:  1. Small region of ischemia in the anterior wall.  2. Septal hypokinesia.  3. Left ventricular ejection fraction equal52%   Assessment/Plan  Principal Problem:   Unstable angina Active Problems:   CAD, non DES to CFX '09, LAD BMS 7/11. Myoview low risk 10/12   Chronic atrial fibrillation   GERD (gastroesophageal reflux disease)   Hypothyroidism   Long term (current) use of anticoagulants   SSS (sick sinus syndrome)- MDT PTVDP 9/11   Cardiomyopathy, suspected to be secondary to AF, not CAD. EF 35-40% on 11/13 echo   Situational stress  Plan: NST yesterday demonstrated  a small region of ischemia in the anterior wall w/ septal hypokinesia. LV EF was 52%. The images were reviewed by Dr. Tresa Endo yesterday. She has had no further chest pain. ? If a diagnostic cath is warranted. Dr. Tresa Endo to assess and will decide. Her INR is 1.82 today. Would prefer INR to be <1.7 if cath to be done, however, can possibly do radially. A-fib is controlled with rates in the low 60s. Pt had endorsed urinary frequency yesterday and strong urine odor. U/A yesterday was normal and not suggestive of a UTI. Dr. Tresa Endo to see and will determine treatment plan.    LOS: 2 days    Brittainy M. Delmer Islam 05/10/2013 8:25 AM   Patient seen and examined. Agree with assessment and plan. Long discussion with patient and husband. Pt is s/p LCX PCI/stent in 2009, and mid LAD stent in 2011. She cannot remember her symptoms. She has had some atypical recurrent chest pain symptoms. Nuclear study yesterday reveals mild distal anterior apical ischemia. ? Real vs breast shift artifact. Discussed options including increased med Rx vc repeat definitive cath. Husband has concern with repetitive symmptoms. Coumadin has been held.  Mid LAD stent is a BMS with higher risk for restenosis c/w DES. After much discussion will plan definitive cath later today radially. Will recheck PT at noon.   Lennette Bihari, MD, [facility] 05/10/2013 9:20 AM

## 2013-05-11 ENCOUNTER — Telehealth: Payer: Self-pay | Admitting: Cardiovascular Disease

## 2013-05-11 ENCOUNTER — Encounter: Payer: Self-pay | Admitting: Physician Assistant

## 2013-05-11 ENCOUNTER — Ambulatory Visit (INDEPENDENT_AMBULATORY_CARE_PROVIDER_SITE_OTHER): Payer: Medicare Other | Admitting: Physician Assistant

## 2013-05-11 VITALS — BP 118/70 | HR 88 | Wt 171.9 lb

## 2013-05-11 DIAGNOSIS — R270 Ataxia, unspecified: Secondary | ICD-10-CM

## 2013-05-11 DIAGNOSIS — R279 Unspecified lack of coordination: Secondary | ICD-10-CM

## 2013-05-11 NOTE — Patient Instructions (Signed)
If you noticed worsening symptoms, I recommend going to the emergency room immediately.

## 2013-05-11 NOTE — Assessment & Plan Note (Signed)
Patient appears neurologically intact at this time. We discussed signs and symptoms of stroke and should she or her husband notice any of these she should call EMS and go to the emergency room immediately.  We checked orthostatic vital signs were normal.

## 2013-05-11 NOTE — Telephone Encounter (Signed)
Spoke to patient.She states she woke up in the middle of the night was unable walk in straight line. She says she has unsteady gait. She feels "goofy" went back to bed. She states she has a headache , no chest pain ,no shortness of breathe. While lying down feels ok but sitting up dizziness. Patient had a cardiac cath on Thur 6/12 and 6/11 stress test. Discuss with extender Bryan. Appointment made for 11:20 today. Patient and husband aware

## 2013-05-11 NOTE — Discharge Summary (Signed)
Physician Discharge Summary  Patient ID: [name] MRN: [id number] DOB/AGE: 1930/11/11 79 y.o.  Admit date: 05/08/2013 Discharge date: 05/11/2013  Admission Diagnoses:  Unstable angina  Discharge Diagnoses:  Principal Problem:   Unstable angina Active Problems:   CAD, non DES to CFX '09, LAD BMS 7/11. Myoview low risk 10/12   Chronic atrial fibrillation   GERD (gastroesophageal reflux disease)   Hypothyroidism   Long term (current) use of anticoagulants   SSS (sick sinus syndrome)- MDT PTVDP 9/11   Cardiomyopathy, suspected to be secondary to AF, not CAD. EF 35-40% on 11/13 echo   Situational stress   Discharged Condition: stable  Hospital Course:   The patient is an 79 year old, married, white mother of 5 with 5 grandchildren and 2 great-grandchildren. She has never been a smoker. She has a known history of coronary disease, permanent pacemaker and sick sinus syndrome with chronic atrial fibrillation, on chronic anticoagulation. From a coronary standpoint, she had a non-DES stent to the mid circumflex in 2009 and LAD stent to the mid-distal LAD BMS June 18, 2010. She had a negative Myoview followup for ischemia in October 2012.  She has known LV dysfunction associated with her chronic atrial fibrillation but no overt heart failure. Her last echo was November 2013 which showed an EF of 35% to 40% with no significant valve disease except for mild to moderate MR. Normal RVSP and left atrium was severely dilated. Her pacemaker is a Medtronic Adapta implanted August 11, 2010, when she presented with atrial fibrillation.   She presented to the ER at [facility] with complaints of Lt sided chest pain. This woke her up about 4am but she admits she had some Sunday as well. It seemed to be localized but was not sharp. She said when she presented with [place] in the past she also had atypical symptoms and this episode feels similar to past episodes. She denied associated diaphoresis but admits to some  associated nausea. She says she has been under a great deal of stress at home secondary to her sons illness, he apparently required hospitalization.   Patient was admitted for observation. We cycled her cardiac enzymes and held her Coumadin and initially had planned for a Myoview stress test.  The test was completed and demonstrated a small region of ischemia in the anterior wall with septal hypokinesia. EF was 52%. She was then scheduled for left heart catheterization which revealed mild coronary obstructive disease with a widely patent mid LAD stent, 40% ostial first diagonal stenosis, patent stent in the circumflex vessel with 20-30% intimal hyperplasia, 20% irregularity in the dominant right coronary artery. Also had observed mild mitral valve prolapse.   The patient was seen by Dr. Tresa Endo who felt she was stable for DC home.  Consults: None  Significant Diagnostic Studies:  05/10/2013 Left heart catheterization HEMODYNAMIC DATA:  AO SYSTOLIC/AO DIASTOLIC: 135/62  LV SYSTOLIC/LV DIASTOLIC: 135/10  ANGIOGRAPHY:  The left main coronary artery bifurcated into the LAD and left circumflex vessel and was free of significant disease.  The LAD had mild smooth narrowing of 30% prior to an ectatic proximal segment. The first diagonal vessel had 40% ostial narrowing. The mid LAD stent was widely patent. The distal LAD was small caliber and free of significant disease.  The left circumflex coronary artery had 20% ostial narrowing and in this region there was a very small marginal vessel with a smooth narrowing ostially of 60-70%. The stent in the circumflex vessel was patent and have smooth 20  to less than 30% intimal hyperplasia.  The right current artery was a large dominant vessel that had 20% areas of irregularity in its proximal to mid segment. The vessel supplied a large PDA and PLA system was applied the inferior wall almost towards the apex.  RAO left ventriculography revealed an ejection fraction of  approximately 55%. There were no focal segmental wall motion abnormalities. There was evidence for angiographic mitral valve prolapse with 1+ mitral regurgitation.  IMPRESSION:  Mild coronary obstructive disease with widely patent mid LAD stent, 40% ostial first diagonal stenosis, patent stent in the circumflex vessel with 20-30% intimal hyperplasia, 20% irregularity in a dominant right carotid artery.  Mild mitral valve prolapse with 1+ angiographic mitral regurgitation and normal systolic function.  RECOMMENDATION: Medical therapy.  Lennette Bihari, MD, [facility]  05/10/2013  CBC    Component Value Date/Time   WBC 7.5 05/08/2013 0757   RBC 4.92 05/08/2013 0757   HGB 14.1 05/08/2013 0757   HCT 43.0 05/08/2013 0757   PLT 208 05/08/2013 0757   MCV 87.4 05/08/2013 0757   MCH 28.7 05/08/2013 0757   MCHC 32.8 05/08/2013 0757   RDW 14.4 05/08/2013 0757   LYMPHSABS 2.3 01/09/2013 2050   MONOABS 0.8 01/09/2013 2050   EOSABS 0.3 01/09/2013 2050   BASOSABS 0.0 01/09/2013 2050   BMET    Component Value Date/Time   NA 138 05/10/2013 1210   K 4.1 05/10/2013 1210   CL 101 05/10/2013 1210   CO2 30 05/10/2013 1210   GLUCOSE 95 05/10/2013 1210   BUN 19 05/10/2013 1210   CREATININE 0.58 05/10/2013 1210   CALCIUM 9.0 05/10/2013 1210   GFRNONAA 85* 05/10/2013 1210   GFRAA >90 05/10/2013 1210    Lipid Panel     Component Value Date/Time   CHOL  Value: 191        ATP III CLASSIFICATION:  <200     mg/dL   Desirable  119-147  mg/dL   Borderline High  >=829    mg/dL   High        5/62/1308 0401   TRIG 109 06/16/2010 0401   HDL 53 06/16/2010 0401   CHOLHDL 3.6 06/16/2010 0401   VLDL 22 06/16/2010 0401   LDLCALC  Value: 116        Total Cholesterol/HDL:CHD Risk Coronary Heart Disease Risk Table                     Men   Women  1/2 Average Risk   3.4   3.3  Average Risk       5.0   4.4  2 X Average Risk   9.6   7.1  3 X Average Risk  23.4   11.0        Use the calculated Patient Ratio above and the CHD Risk Table to determine  the patient's CHD Risk.        ATP III CLASSIFICATION (LDL):  <100     mg/dL   Optimal  657-846  mg/dL   Near or Above                    Optimal  130-159  mg/dL   Borderline  962-952  mg/dL   High  >841     mg/dL   Very High* 02/19/4009 0401   Treatments: See above  Discharge Exam: Blood pressure 129/71, pulse 59, temperature 97.4 F (36.3 C), temperature source Oral, resp. rate 18, height 5'  9" (1.753 m), weight 176 lb 6.4 oz (80.015 kg), SpO2 99.00%.   Disposition: 01-Home or Self Care  Discharge Orders   Future Appointments Provider Department Dept Phone   05/11/2013 11:20 AM Wilburt Finlay, PA-C [facility] [place] [phone]   Future Orders Complete By Expires     Diet - low sodium heart healthy  As directed     Discharge instructions  As directed     Comments:      No lifting more than a half gallon of milk with your right arm for three days.    Increase activity slowly  As directed         Medication List    TAKE these medications       bag balm Oint ointment  Apply 1 application topically daily as needed.     digoxin 0.125 MG tablet  Commonly known as:  LANOXIN  Take 0.125 mg by mouth daily.     hydrochlorothiazide 12.5 MG tablet  Commonly known as:  HYDRODIURIL  Take 12.5 mg by mouth every other day.     isosorbide mononitrate 30 MG 24 hr tablet  Commonly known as:  IMDUR  Take 30 mg by mouth every evening.     levothyroxine 100 MCG tablet  Commonly known as:  SYNTHROID, LEVOTHROID  Take 100 mcg by mouth daily.     losartan 50 MG tablet  Commonly known as:  COZAAR  Take 50 mg by mouth daily.     metoprolol succinate 100 MG 24 hr tablet  Commonly known as:  TOPROL-XL  Take 100 mg by mouth daily.     omeprazole 40 MG capsule  Commonly known as:  PRILOSEC  Take 40 mg by mouth daily.     POTASSIUM PO  Take 1 tablet by mouth daily as needed (for muscle cramps).     traMADol 50 MG tablet  Commonly known as:  ULTRAM  Take  25-50 mg by mouth every 6 (six) hours as needed for pain.     warfarin 2.5 MG tablet  Commonly known as:  COUMADIN  Take 2.5 mg by mouth at bedtime.           Follow-up Information   Follow up with Lennette Bihari, MD. (Our office will call you with your follow up appt date and time.)    Contact information:   [address] [place] [address] [phone]       Signed: Wilburt Finlay 05/11/2013, 9:55 AM

## 2013-05-11 NOTE — Progress Notes (Signed)
Date:  05/11/2013   ID:  [name], DOB 1930-10-14, MRN [id number]  PCP:  Azalee Course, CMA  Primary Cardiologist:  Tresa Endo    History of Present Illness: [name] is a 79 y.o. female   The patient is an 79 year old, married, white mother of 5 with 5 grandchildren and 2 great-grandchildren. She has never been a smoker. She has a known history of coronary disease, permanent pacemaker and sick sinus syndrome with chronic atrial fibrillation, on chronic anticoagulation. From a coronary standpoint, she had a non-DES stent to the mid circumflex in 2009 and LAD stent to the mid-distal LAD BMS June 18, 2010. She had a negative Myoview followup for ischemia in October 2012. She has known LV dysfunction associated with her chronic atrial fibrillation but no overt heart failure. Her last echo was November 2013 which showed an EF of 35% to 40% with no significant valve disease except for mild to moderate MR. Normal RVSP and left atrium was severely dilated. Her pacemaker is a Medtronic Adapta implanted August 11, 2010, when she presented with atrial fibrillation.   She presented to the ER at [facility] with complaints of Lt sided chest pain. This woke her up about 4am but she admits she had some Sunday as well. It seemed to be localized but was not sharp. She said when she presented with [place] in the past she also had atypical symptoms and this episode feels similar to past episodes. She denied associated diaphoresis but admits to some associated nausea. She says she has been under a great deal of stress at home secondary to her sons illness, he apparently required hospitalization.   She was admitted for observation. We cycled her cardiac enzymes and held her Coumadin and initially had planned for a Myoview stress test. The test was completed and demonstrated a small region of ischemia in the anterior wall with septal hypokinesia. EF was 52%. She was then scheduled for left heart catheterization which revealed  mild coronary obstructive disease with a widely patent mid LAD stent, 40% ostial first diagonal stenosis, patent stent in the circumflex vessel with 20-30% intimal hyperplasia, 20% irregularity in the dominant right coronary artery. Also had observed mild mitral valve prolapse.   Patient called in to the office complaining of being dizziness and unsteady in her gait. She reported that she walked into a door because she could not hold her balance.  Since arriving at the office she reports dramatic improvement. She states that at no time did she have any facial droop, slurring of words, or weakness on one side or the other.  No vision changes reported she also reports she did not feel any discoordination and other movements.  She also reports that she is extremely anxious and was very worried about the procedure yesterday stating that the last time show heart catheterization she was put to death.   The patient currently denies nausea, vomiting, fever, chest pain, shortness of breath, orthopnea, , PND, cough, congestion, abdominal pain, hematochezia, melena, lower extremity edema,  Wt Readings from Last 3 Encounters:  05/11/13 171 lb 14.4 oz (77.973 kg)  05/10/13 176 lb 6.4 oz (80.015 kg)  05/10/13 176 lb 6.4 oz (80.015 kg)     Past Medical History  Diagnosis Date  . Coronary artery disease   . A-fib   . Stroke   . Pacemaker   . Arthritis   . Diabetes mellitus   . Thyroid disease   . Anxiety  Current Outpatient Prescriptions  Medication Sig Dispense Refill  . bag balm OINT ointment Apply 1 application topically daily as needed.      . digoxin (LANOXIN) 0.125 MG tablet Take 0.125 mg by mouth daily.      . hydrochlorothiazide (HYDRODIURIL) 12.5 MG tablet Take 12.5 mg by mouth every other day.       . isosorbide mononitrate (IMDUR) 30 MG 24 hr tablet Take 30 mg by mouth every evening.       Marland Kitchen levothyroxine (SYNTHROID, LEVOTHROID) 100 MCG tablet Take 100 mcg by mouth daily.      Marland Kitchen losartan  (COZAAR) 50 MG tablet Take 50 mg by mouth daily.       . metoprolol succinate (TOPROL-XL) 100 MG 24 hr tablet Take 100 mg by mouth daily.       Marland Kitchen omeprazole (PRILOSEC) 40 MG capsule Take 40 mg by mouth daily.      Marland Kitchen POTASSIUM PO Take 1 tablet by mouth daily as needed (for muscle cramps).      . traMADol (ULTRAM) 50 MG tablet Take 25-50 mg by mouth every 6 (six) hours as needed for pain.       Marland Kitchen warfarin (COUMADIN) 2.5 MG tablet Take 2.5 mg by mouth at bedtime.       No current facility-administered medications for this visit.    Allergies:    Allergies  Allergen Reactions  . Iohexol Other (See Comments)     Desc: CHEST PAIN, LOC AFTER HEART CATH, PT REFUSED DYE   . Penicillins Other (See Comments)    REACTION:  unknown  . Sulfur Other (See Comments)    REACTION:  unknown    Social History:  The patient  reports that she has never smoked. She has never used smokeless tobacco. She reports that she does not drink alcohol or use illicit drugs.   ROS:  Please see the history of present illness.  All other systems reviewed and negative.   PHYSICAL EXAM: VS:  BP 118/70  Pulse 88  Wt 171 lb 14.4 oz (77.973 kg)  BMI 25.37 kg/m2 Well nourished, well developed, in no acute distress HEENT: Pupils are equal round react to light accommodation extraocular movements are intact.  Cardiac: Irregular rate and rhythm no murmurs Lungs:  clear to auscultation bilaterally, no wheezing, rhonchi or rales Ext: no lower extremity edema.  2+ radial and dorsalis pedis pulses. Skin: warm and dry Neuro:  Grossly normal,  cranial nerves II through XII intact. Heel-to-shin and alternating finger to thumb coordination normal. Patient appeared initially unsteady with ambulation but then appeared to be normal with no ataxia.      ASSESSMENT AND PLAN:  Problem List Items Addressed This Visit   Ataxia - Primary     Patient appears neurologically intact at this time. We discussed signs and symptoms of stroke  and should she or her husband notice any of these she should call EMS and go to the emergency room immediately.  We checked orthostatic vital signs were normal.

## 2013-05-11 NOTE — Telephone Encounter (Signed)
Mr. Hanken states that [name] had a heart cath yesterday and went home last night.  Now she is having a problem with dizziness and can not walk straight.

## 2013-05-29 ENCOUNTER — Other Ambulatory Visit: Payer: Self-pay | Admitting: Cardiovascular Disease

## 2013-05-30 LAB — PROTIME-INR: Prothrombin Time: 27.1 seconds — ABNORMAL HIGH (ref 11.6–15.2)

## 2013-05-31 ENCOUNTER — Telehealth: Payer: Self-pay | Admitting: Cardiovascular Disease

## 2013-05-31 NOTE — Telephone Encounter (Signed)
Returned call.  Donald stated Dr. Alanda Amass rx'd losartan potassium and metoprolol 100mg .  Stated pt's BP was 112/50 at PCP on Monday.  Stated pt has been having dizziness and unsteady on her feet.  Stated this has been going on on and off since she was discharged from the hospital.  Informed BP is okay and bottom number is a little low.  Advised good hydration and orthostatic precautions.  Verbalized understanding and asked that RN call pt.  Dorinda Hill also stated pt had her INR checked on Monday and they haven't heard anything back yet.  Informed RN will check on it and notify Belenda Cruise, PharmD.  Call to pt and discussed at length BP, meds and symptoms.  Pt did not want to come in to be seen and took advice as stated above.  Pt agreed to call back if symptoms worsen for an appt for evaluation.  Belenda Cruise, PharmD notified of INR 2.64 (result in Epic).  Advised pt continue current dose.  Pt informed and verbalized understanding.

## 2013-05-31 NOTE — Telephone Encounter (Signed)
[name] has questions about [name] losartan/potassium and her bp is decreasing bp on Monday was 112/50... Please Call..  Thanks

## 2013-06-04 ENCOUNTER — Ambulatory Visit (INDEPENDENT_AMBULATORY_CARE_PROVIDER_SITE_OTHER): Payer: Self-pay | Admitting: Pharmacist Clinician (PhC)/ Clinical Pharmacy Specialist

## 2013-06-04 DIAGNOSIS — I482 Chronic atrial fibrillation, unspecified: Secondary | ICD-10-CM

## 2013-06-04 DIAGNOSIS — I4891 Unspecified atrial fibrillation: Secondary | ICD-10-CM

## 2013-06-04 DIAGNOSIS — Z7901 Long term (current) use of anticoagulants: Secondary | ICD-10-CM

## 2013-06-04 NOTE — Telephone Encounter (Signed)
Pt aware to recheck INR in 4 weeks.

## 2013-06-12 ENCOUNTER — Telehealth: Payer: Self-pay | Admitting: Cardiovascular Disease

## 2013-06-12 NOTE — Telephone Encounter (Signed)
Want to know if the medication that Dr.Faulkner prescribe for her will mess with coumadin levels.(Patient is not sure of the medications but was told to call us and let Dr.Kelly know).. Please call   thanks

## 2013-06-13 NOTE — Telephone Encounter (Signed)
Routed this message to [facility] to answer.

## 2013-06-13 NOTE — Telephone Encounter (Signed)
cipro 500mg  twice daily for 10 days, started first dose this am.  Currently on 2.5mg  warfarin qd - advised to decrease to 1.25mg  x 2 doses then resume 2.5mg  qd.  Repeat blood work on Monday July 21.  Pt voiced understanding

## 2013-06-13 NOTE — Telephone Encounter (Signed)
kristen can you find out what she is talking about

## 2013-06-18 ENCOUNTER — Other Ambulatory Visit: Payer: Self-pay | Admitting: Cardiovascular Disease

## 2013-06-18 LAB — PROTIME-INR: INR: 2.19 — ABNORMAL HIGH (ref <1.50)

## 2013-06-22 ENCOUNTER — Telehealth: Payer: Self-pay | Admitting: Cardiovascular Disease

## 2013-06-22 NOTE — Telephone Encounter (Signed)
Pt's husband is calling about [name]'s coumadin levels. She had blood work done and no call back about the results. Can someone please call him back to let him know what her level was and if she is ok.

## 2013-06-22 NOTE — Telephone Encounter (Signed)
Left message on phone. INR 2.16   06/18/13 Instruct to keep same dose have it recheck in 4 weeks

## 2013-06-25 ENCOUNTER — Ambulatory Visit (INDEPENDENT_AMBULATORY_CARE_PROVIDER_SITE_OTHER): Payer: Self-pay | Admitting: Cardiovascular Disease

## 2013-06-25 DIAGNOSIS — Z7901 Long term (current) use of anticoagulants: Secondary | ICD-10-CM

## 2013-06-25 DIAGNOSIS — I482 Chronic atrial fibrillation, unspecified: Secondary | ICD-10-CM

## 2013-06-25 DIAGNOSIS — I4891 Unspecified atrial fibrillation: Secondary | ICD-10-CM

## 2013-07-04 ENCOUNTER — Other Ambulatory Visit: Payer: Self-pay

## 2013-07-23 ENCOUNTER — Telehealth: Payer: Self-pay | Admitting: Cardiovascular Disease

## 2013-07-23 NOTE — Telephone Encounter (Signed)
Wanting to know when she should have her coumadin done again .Marland Kitchen Please Call   Thanks

## 2013-07-24 NOTE — Telephone Encounter (Signed)
Forgot when last INR was, needs to know when due.   - advised pt last check 7/21, will go in this week.

## 2013-07-26 ENCOUNTER — Other Ambulatory Visit: Payer: Self-pay | Admitting: Cardiovascular Disease

## 2013-07-26 NOTE — Telephone Encounter (Signed)
Rx was sent to pharmacy electronically. 

## 2013-07-27 ENCOUNTER — Ambulatory Visit (INDEPENDENT_AMBULATORY_CARE_PROVIDER_SITE_OTHER): Payer: Medicare Other | Admitting: Pharmacist Clinician (PhC)/ Clinical Pharmacy Specialist

## 2013-07-27 ENCOUNTER — Other Ambulatory Visit: Payer: Self-pay | Admitting: Cardiovascular Disease

## 2013-07-27 DIAGNOSIS — I4891 Unspecified atrial fibrillation: Secondary | ICD-10-CM

## 2013-07-27 DIAGNOSIS — Z7901 Long term (current) use of anticoagulants: Secondary | ICD-10-CM

## 2013-07-27 DIAGNOSIS — I482 Chronic atrial fibrillation, unspecified: Secondary | ICD-10-CM

## 2013-07-27 LAB — PROTIME-INR
INR: 3.24 — ABNORMAL HIGH (ref <1.50)
Prothrombin Time: 31.5 seconds — ABNORMAL HIGH (ref 11.6–15.2)

## 2013-08-19 ENCOUNTER — Encounter: Payer: Self-pay | Admitting: *Deleted

## 2013-08-20 ENCOUNTER — Ambulatory Visit (INDEPENDENT_AMBULATORY_CARE_PROVIDER_SITE_OTHER): Payer: Medicare Other | Admitting: Cardiovascular Disease

## 2013-08-20 ENCOUNTER — Ambulatory Visit (INDEPENDENT_AMBULATORY_CARE_PROVIDER_SITE_OTHER): Payer: Medicare Other | Admitting: Pharmacist Clinician (PhC)/ Clinical Pharmacy Specialist

## 2013-08-20 ENCOUNTER — Other Ambulatory Visit: Payer: Self-pay

## 2013-08-20 DIAGNOSIS — I4891 Unspecified atrial fibrillation: Secondary | ICD-10-CM

## 2013-08-20 DIAGNOSIS — Z7901 Long term (current) use of anticoagulants: Secondary | ICD-10-CM

## 2013-08-20 LAB — PACEMAKER DEVICE OBSERVATION
BATTERY VOLTAGE: 2.77 V
BRDY-0002RV: 60 {beats}/min
RV LEAD THRESHOLD: 0.5 V

## 2013-08-20 LAB — POCT INR: INR: 3.1

## 2013-08-28 ENCOUNTER — Encounter: Payer: Self-pay | Admitting: Cardiovascular Disease

## 2013-09-01 ENCOUNTER — Other Ambulatory Visit: Payer: Self-pay | Admitting: Pharmacist Clinician (PhC)/ Clinical Pharmacy Specialist

## 2013-09-06 ENCOUNTER — Telehealth: Payer: Self-pay | Admitting: Cardiovascular Disease

## 2013-09-06 MED ORDER — WARFARIN SODIUM 2.5 MG PO TABS: ORAL_TABLET | ORAL | Status: DC

## 2013-09-06 MED ORDER — METOPROLOL SUCCINATE ER 100 MG PO TB24: 100.0000 mg | ORAL_TABLET | Freq: Every day | ORAL | Status: DC

## 2013-09-06 NOTE — Telephone Encounter (Signed)
Refills sent into pharmacy for Metoprolol Succ 100mg  QD.   Routed to Phylis Bougie, PharmD for approval of Warfarin.

## 2013-09-06 NOTE — Telephone Encounter (Signed)
Her pharmacy closed down-need new prescriptions called in for her Metoprolol and Warfarin-Please call to [phone].Please call this in today.

## 2013-09-10 ENCOUNTER — Ambulatory Visit (INDEPENDENT_AMBULATORY_CARE_PROVIDER_SITE_OTHER): Payer: Medicare Other | Admitting: *Deleted

## 2013-09-10 DIAGNOSIS — I482 Chronic atrial fibrillation, unspecified: Secondary | ICD-10-CM

## 2013-09-10 DIAGNOSIS — I4891 Unspecified atrial fibrillation: Secondary | ICD-10-CM

## 2013-09-10 DIAGNOSIS — Z7901 Long term (current) use of anticoagulants: Secondary | ICD-10-CM

## 2013-09-10 LAB — POCT INR: INR: 3.8

## 2013-09-15 ENCOUNTER — Emergency Department (HOSPITAL_COMMUNITY): Payer: Medicare Other

## 2013-09-15 ENCOUNTER — Encounter (HOSPITAL_COMMUNITY): Payer: Self-pay | Admitting: Emergency Medicine

## 2013-09-15 ENCOUNTER — Emergency Department (HOSPITAL_COMMUNITY)
Admission: EM | Admit: 2013-09-15 | Discharge: 2013-09-15 | Disposition: A | Discharge status: Home / Self Care | Payer: Medicare Other | Attending: Emergency Medicine | Admitting: Emergency Medicine

## 2013-09-15 DIAGNOSIS — Z950 Presence of cardiac pacemaker: Secondary | ICD-10-CM | POA: Insufficient documentation

## 2013-09-15 DIAGNOSIS — E119 Type 2 diabetes mellitus without complications: Secondary | ICD-10-CM | POA: Insufficient documentation

## 2013-09-15 DIAGNOSIS — X500XXA Overexertion from strenuous movement or load, initial encounter: Secondary | ICD-10-CM | POA: Insufficient documentation

## 2013-09-15 DIAGNOSIS — Z8673 Personal history of transient ischemic attack (TIA), and cerebral infarction without residual deficits: Secondary | ICD-10-CM | POA: Insufficient documentation

## 2013-09-15 DIAGNOSIS — F411 Generalized anxiety disorder: Secondary | ICD-10-CM | POA: Insufficient documentation

## 2013-09-15 DIAGNOSIS — Z7901 Long term (current) use of anticoagulants: Secondary | ICD-10-CM | POA: Insufficient documentation

## 2013-09-15 DIAGNOSIS — Z9861 Coronary angioplasty status: Secondary | ICD-10-CM | POA: Insufficient documentation

## 2013-09-15 DIAGNOSIS — M129 Arthropathy, unspecified: Secondary | ICD-10-CM | POA: Insufficient documentation

## 2013-09-15 DIAGNOSIS — Z8619 Personal history of other infectious and parasitic diseases: Secondary | ICD-10-CM | POA: Insufficient documentation

## 2013-09-15 DIAGNOSIS — Z79899 Other long term (current) drug therapy: Secondary | ICD-10-CM | POA: Insufficient documentation

## 2013-09-15 DIAGNOSIS — Z880 Allergy status to penicillin: Secondary | ICD-10-CM | POA: Insufficient documentation

## 2013-09-15 DIAGNOSIS — I2510 Atherosclerotic heart disease of native coronary artery without angina pectoris: Secondary | ICD-10-CM | POA: Insufficient documentation

## 2013-09-15 DIAGNOSIS — S6390XA Sprain of unspecified part of unspecified wrist and hand, initial encounter: Secondary | ICD-10-CM | POA: Insufficient documentation

## 2013-09-15 DIAGNOSIS — Y9389 Activity, other specified: Secondary | ICD-10-CM | POA: Insufficient documentation

## 2013-09-15 DIAGNOSIS — S63602A Unspecified sprain of left thumb, initial encounter: Secondary | ICD-10-CM

## 2013-09-15 DIAGNOSIS — I4891 Unspecified atrial fibrillation: Secondary | ICD-10-CM | POA: Insufficient documentation

## 2013-09-15 DIAGNOSIS — Y929 Unspecified place or not applicable: Secondary | ICD-10-CM | POA: Insufficient documentation

## 2013-09-15 DIAGNOSIS — E079 Disorder of thyroid, unspecified: Secondary | ICD-10-CM | POA: Insufficient documentation

## 2013-09-15 NOTE — ED Provider Notes (Signed)
CSN: [id number]     Arrival date & time 09/15/13  4098 History   First MD Initiated Contact with Patient 09/15/13 0919     Chief Complaint  Patient presents with  . Hand Injury   (Consider location/radiation/quality/duration/timing/severity/associated sxs/prior Treatment) HPI Complains of left hand pain at the thenar eminence. Onset 2 days ago when she was taking a freezer tray out of the freezer and her hand bent in an abnormal direction. Pain is nonradiating. Worse with movement. Improved with remaining still. She treated herself with a tramadol tablet this morning with partial relief. No other associated symptoms. Past Medical History  Diagnosis Date  . Coronary artery disease   . A-fib   . Stroke   . Pacemaker   . Arthritis   . Diabetes mellitus   . Thyroid disease   . Anxiety   . Polio   . Hx of echocardiogram 09/2012    showed Ef 35-40% with no significant valve disease except for mild to moderate MR. normal RVSP and left atrium was severly dilated.  Marland Kitchen History of stress test 08/2011    negative for ischemia it was nongated and nondiagnostic and compatible with a possible scar or peri-infraction ischemia.    Past Surgical History  Procedure Laterality Date  . Cardiac surgery    . 3 cardiac stents      she has had non-DES stent to the mid circumflex in 2009 and LAD stent to the mid-distal LAD  BMS June 18, 2010  . Abdominal hysterectomy  1978  . Tonsillectomy    . Appendectomy    . Pacemaker placement  08/11/2010    Medtronic Adapta   . Coronary angioplasty     Family History  Problem Relation Age of Onset  . Heart disease Father   . Cancer Mother    History  Substance Use Topics  . Smoking status: Never Smoker   . Smokeless tobacco: Never Used  . Alcohol Use: No   OB History   Grav Para Term Preterm Abortions TAB SAB Ect Mult Living                 Review of Systems  Constitutional: Negative.   Musculoskeletal: Positive for arthralgias.       Pain at  left thumb    Allergies  Iohexol; Penicillins; and Sulfur  Home Medications   Current Outpatient Rx  Name  Route  Sig  Dispense  Refill  . bag balm OINT ointment   Topical   Apply 1 application topically daily as needed.         . digoxin (LANOXIN) 0.125 MG tablet   Oral   Take 0.125 mg by mouth daily.         . hydrochlorothiazide (HYDRODIURIL) 12.5 MG tablet   Oral   Take 12.5 mg by mouth every other day.          . isosorbide mononitrate (IMDUR) 30 MG 24 hr tablet      TAKE ONE TABLET BY MOUTH EVERY DAY   30 tablet   10   . levothyroxine (SYNTHROID, LEVOTHROID) 100 MCG tablet   Oral   Take 100 mcg by mouth daily.         Marland Kitchen losartan (COZAAR) 50 MG tablet   Oral   Take 50 mg by mouth daily.          . metoprolol succinate (TOPROL-XL) 100 MG 24 hr tablet   Oral   Take 1 tablet (100 mg total) by  mouth daily.   30 tablet   9   . omeprazole (PRILOSEC) 40 MG capsule   Oral   Take 40 mg by mouth daily.         Marland Kitchen POTASSIUM PO   Oral   Take 1 tablet by mouth daily as needed (for muscle cramps).         . traMADol (ULTRAM) 50 MG tablet   Oral   Take 25-50 mg by mouth every 6 (six) hours as needed for pain.          Marland Kitchen warfarin (COUMADIN) 2.5 MG tablet      Take 1 tablet by mouth daily or as directed   30 tablet   5    BP 130/76  Pulse 61  Temp(Src) 97 F (36.1 C) (Oral)  Resp 20  Ht 5\' 9"  (1.753 m)  Wt 170 lb (77.111 kg)  BMI 25.09 kg/m2  SpO2 97% Physical Exam  Nursing note and vitals reviewed. Constitutional: She appears well-developed and well-nourished. No distress.  HENT:  Head: Normocephalic and atraumatic.  Right Ear: External ear normal.  Left Ear: External ear normal.  Eyes: EOM are normal.  Neck: Neck supple.  Cardiovascular: Normal rate.   Pulmonary/Chest: Effort normal.  Abdominal: She exhibits no distension.  Musculoskeletal:  Left upper extremity skin intact not warm or red. Tender and swollen thenar eminence.  Limited range of motion of thumb duepain. Good capillary refill. All other extremities without redness or tenderness neurovascularly intact.    ED Course  Procedures (including critical care time) Labs Review Labs Reviewed - No data to display Imaging Review No results found.  EKG Interpretation   None      x-ray viewed by me Results for orders placed in visit on 09/10/13  POCT INR      Result Value Range   INR 3.8     Dg Hand Complete Left  09/15/2013   CLINICAL DATA:  Left hand pain.  EXAM: LEFT HAND - COMPLETE 3+ VIEW  COMPARISON:  06/14/2006.  FINDINGS: Advanced degenerative change at the carpometacarpal joint of thumb with exuberant osteophytic spurring. No acute fracture. There are osteoarthritic type degenerative changes involving the DIP and PIP joints of the fingers. Mild to moderate osteoporosis is noted.  IMPRESSION: No acute fracture.  Severe degenerative changes at the carpometacarpal joint of thumb.   Electronically Signed   By: Loralie Champagne M.D.   On: 09/15/2013 09:46    Velcro splint placed on patient by orthopedic technician. Patient comfortable in splint. MDM  No diagnosis found. Plan she has tramadol which he can take for pain. Referral Dr.Ortmann Diagnosis sprain of left thumb    Doug Sou, MD 09/15/13 1100

## 2013-09-15 NOTE — ED Notes (Addendum)
Pt reports trying to crack an ice tray on Thursday, had pain to left hand and then started swelling yesterday. Now having swelling and pain to left hand and nausea.

## 2013-09-15 NOTE — ED Notes (Signed)
Paged ortho tech to apply splint

## 2013-09-15 NOTE — ED Notes (Signed)
Patient transported to X-ray 

## 2013-09-30 ENCOUNTER — Emergency Department (HOSPITAL_COMMUNITY): Payer: Medicare Other

## 2013-09-30 ENCOUNTER — Emergency Department (HOSPITAL_COMMUNITY)
Admission: EM | Admit: 2013-09-30 | Discharge: 2013-09-30 | Disposition: A | Discharge status: Home / Self Care | Payer: Medicare Other | Attending: Emergency Medicine | Admitting: Emergency Medicine

## 2013-09-30 ENCOUNTER — Encounter (HOSPITAL_COMMUNITY): Payer: Self-pay | Admitting: Emergency Medicine

## 2013-09-30 DIAGNOSIS — Z8739 Personal history of other diseases of the musculoskeletal system and connective tissue: Secondary | ICD-10-CM | POA: Insufficient documentation

## 2013-09-30 DIAGNOSIS — K5900 Constipation, unspecified: Secondary | ICD-10-CM | POA: Insufficient documentation

## 2013-09-30 DIAGNOSIS — Z7901 Long term (current) use of anticoagulants: Secondary | ICD-10-CM | POA: Insufficient documentation

## 2013-09-30 DIAGNOSIS — Z79899 Other long term (current) drug therapy: Secondary | ICD-10-CM | POA: Insufficient documentation

## 2013-09-30 DIAGNOSIS — I4891 Unspecified atrial fibrillation: Secondary | ICD-10-CM | POA: Insufficient documentation

## 2013-09-30 DIAGNOSIS — Z950 Presence of cardiac pacemaker: Secondary | ICD-10-CM | POA: Insufficient documentation

## 2013-09-30 DIAGNOSIS — Z880 Allergy status to penicillin: Secondary | ICD-10-CM | POA: Insufficient documentation

## 2013-09-30 DIAGNOSIS — F418 Other specified anxiety disorders: Secondary | ICD-10-CM

## 2013-09-30 DIAGNOSIS — R109 Unspecified abdominal pain: Secondary | ICD-10-CM | POA: Insufficient documentation

## 2013-09-30 DIAGNOSIS — E119 Type 2 diabetes mellitus without complications: Secondary | ICD-10-CM | POA: Insufficient documentation

## 2013-09-30 DIAGNOSIS — R110 Nausea: Secondary | ICD-10-CM | POA: Insufficient documentation

## 2013-09-30 DIAGNOSIS — F411 Generalized anxiety disorder: Secondary | ICD-10-CM | POA: Insufficient documentation

## 2013-09-30 DIAGNOSIS — Z8612 Personal history of poliomyelitis: Secondary | ICD-10-CM | POA: Insufficient documentation

## 2013-09-30 DIAGNOSIS — I2510 Atherosclerotic heart disease of native coronary artery without angina pectoris: Secondary | ICD-10-CM | POA: Insufficient documentation

## 2013-09-30 DIAGNOSIS — Z8673 Personal history of transient ischemic attack (TIA), and cerebral infarction without residual deficits: Secondary | ICD-10-CM | POA: Insufficient documentation

## 2013-09-30 DIAGNOSIS — R197 Diarrhea, unspecified: Secondary | ICD-10-CM | POA: Insufficient documentation

## 2013-09-30 DIAGNOSIS — E079 Disorder of thyroid, unspecified: Secondary | ICD-10-CM | POA: Insufficient documentation

## 2013-09-30 DIAGNOSIS — Z9889 Other specified postprocedural states: Secondary | ICD-10-CM | POA: Insufficient documentation

## 2013-09-30 DIAGNOSIS — Z9861 Coronary angioplasty status: Secondary | ICD-10-CM | POA: Insufficient documentation

## 2013-09-30 LAB — CBC WITH DIFFERENTIAL/PLATELET
Basophils Relative: 0 % (ref 0–1)
Eosinophils Absolute: 0.2 10*3/uL (ref 0.0–0.7)
Hemoglobin: 13.7 g/dL (ref 12.0–15.0)
Lymphocytes Relative: 26 % (ref 12–46)
Lymphs Abs: 1.8 10*3/uL (ref 0.7–4.0)
MCH: 29.6 pg (ref 26.0–34.0)
MCHC: 33.6 g/dL (ref 30.0–36.0)
Monocytes Relative: 11 % (ref 3–12)
Neutro Abs: 4.2 10*3/uL (ref 1.7–7.7)
Neutrophils Relative %: 60 % (ref 43–77)
Platelets: 204 10*3/uL (ref 150–400)
RBC: 4.63 MIL/uL (ref 3.87–5.11)
RDW: 14.1 % (ref 11.5–15.5)

## 2013-09-30 LAB — URINALYSIS, ROUTINE W REFLEX MICROSCOPIC
Bilirubin Urine: NEGATIVE
Ketones, ur: NEGATIVE mg/dL
Leukocytes, UA: NEGATIVE
Nitrite: NEGATIVE
Specific Gravity, Urine: 1.007 (ref 1.005–1.030)
Urobilinogen, UA: 0.2 mg/dL (ref 0.0–1.0)
pH: 6.5 (ref 5.0–8.0)

## 2013-09-30 LAB — COMPREHENSIVE METABOLIC PANEL
ALT: 11 U/L (ref 0–35)
Albumin: 3.5 g/dL (ref 3.5–5.2)
Alkaline Phosphatase: 75 U/L (ref 39–117)
BUN: 17 mg/dL (ref 6–23)
Chloride: 102 mEq/L (ref 96–112)
GFR calc Af Amer: >90 mL/min (ref >90)
GFR calc non Af Amer: 85 mL/min — ABNORMAL LOW (ref >90)
Glucose, Bld: 95 mg/dL (ref 70–99)
Potassium: 4.2 mEq/L (ref 3.5–5.1)
Sodium: 138 mEq/L (ref 135–145)
Total Bilirubin: 0.6 mg/dL (ref 0.3–1.2)
Total Protein: 6.3 g/dL (ref 6.0–8.3)

## 2013-09-30 LAB — DIGOXIN LEVEL: Digoxin Level: 0.6 ng/mL — ABNORMAL LOW (ref 0.8–2.0)

## 2013-09-30 LAB — PROTIME-INR
INR: 2.5 — ABNORMAL HIGH (ref 0.00–1.49)
Prothrombin Time: 26.2 seconds — ABNORMAL HIGH (ref 11.6–15.2)

## 2013-09-30 MED ORDER — DICYCLOMINE HCL 20 MG PO TABS: 20.0000 mg | ORAL_TABLET | Freq: Two times a day (BID) | ORAL | Status: DC

## 2013-09-30 NOTE — ED Provider Notes (Signed)
CSN: [id number]     Arrival date & time 09/30/13  1119 History   First MD Initiated Contact with Patient 09/30/13 1121     Chief Complaint  Patient presents with  . Chest Pain  . Abdominal Pain    HPI  Patient presents with an episode of abdominal pain that started while she was at church today. She states she's had trouble with "my stomach and my bowels" for several months. She had an mission the summer and had coronary angiography. While undergoing all of her cardiac testing she states she became "tired of hospitals". She was supposed to see her GI doctor and was supposed to have a colonoscopy. She did not have follow through with either one.  Starting today she states she took some Pepto-Bismol before going to church.  When she got to church and to the restroom. She states she felt like her abdomen was cramping and uncomfortable. She did not have a bowel movement or diarrhea. She did have a normal bowel movement this morning. She states she got anxious. Her pain started up into her upper abdomen. Never had any chest pain. She presents here rather anxious and worried.  She states that she gets some abdominal discomfort rather frequently. She attributes a lot of this to a son that lives on their property and is in their home a lot. She states "he is a problem child". She admits a lot of her discomfort seemed to be associated with her anxiety with her adult child.  She states she doesn't like going hospitals because she had polio in the 40s and spent a lot of time" at hospitals and facilities".     Past Medical History  Diagnosis Date  . Coronary artery disease   . A-fib   . Stroke   . Pacemaker   . Arthritis   . Diabetes mellitus   . Thyroid disease   . Anxiety   . Polio   . Hx of echocardiogram 09/2012    showed Ef 35-40% with no significant valve disease except for mild to moderate MR. normal RVSP and left atrium was severly dilated.  Marland Kitchen History of stress test 08/2011    negative for  ischemia it was nongated and nondiagnostic and compatible with a possible scar or peri-infraction ischemia.    Past Surgical History  Procedure Laterality Date  . Cardiac surgery    . 3 cardiac stents      she has had non-DES stent to the mid circumflex in 2009 and LAD stent to the mid-distal LAD  BMS June 18, 2010  . Abdominal hysterectomy  1978  . Tonsillectomy    . Appendectomy    . Pacemaker placement  08/11/2010    Medtronic Adapta   . Coronary angioplasty     Family History  Problem Relation Age of Onset  . Heart disease Father   . Cancer Mother    History  Substance Use Topics  . Smoking status: Never Smoker   . Smokeless tobacco: Never Used  . Alcohol Use: No   OB History   Grav Para Term Preterm Abortions TAB SAB Ect Mult Living                 Review of Systems  Constitutional: Negative for fever, chills, diaphoresis, appetite change and fatigue.  HENT: Negative for mouth sores, sore throat and trouble swallowing.   Eyes: Negative for visual disturbance.  Respiratory: Negative for cough, chest tightness, shortness of breath and wheezing.  Cardiovascular: Negative for chest pain.  Gastrointestinal: Positive for nausea, abdominal pain, diarrhea and constipation. Negative for vomiting and abdominal distention.       Frequently alternates diarrhea and constipation. No diarrhea today. Normal bowel movement this morning  Endocrine: Negative for polydipsia, polyphagia and polyuria.  Genitourinary: Negative for dysuria, frequency and hematuria.  Musculoskeletal: Negative for gait problem.  Skin: Negative for color change, pallor and rash.  Neurological: Negative for dizziness, syncope, light-headedness and headaches.  Hematological: Does not bruise/bleed easily.  Psychiatric/Behavioral: Negative for behavioral problems and confusion.    Allergies  Iohexol; Penicillins; and Sulfur  Home Medications   Current Outpatient Rx  Name  Route  Sig  Dispense  Refill  .  bag balm OINT ointment   Topical   Apply 1 application topically daily as needed (pain).          Marland Kitchen digoxin (LANOXIN) 0.125 MG tablet   Oral   Take 0.125 mg by mouth daily.         Marland Kitchen levothyroxine (SYNTHROID, LEVOTHROID) 100 MCG tablet   Oral   Take 100 mcg by mouth daily.         Marland Kitchen losartan (COZAAR) 50 MG tablet   Oral   Take 50 mg by mouth daily.          . metoprolol succinate (TOPROL-XL) 100 MG 24 hr tablet   Oral   Take 150 mg by mouth daily. Take with or immediately following a meal.         . traMADol (ULTRAM) 50 MG tablet   Oral   Take 25-50 mg by mouth every 6 (six) hours as needed for pain.          Marland Kitchen warfarin (COUMADIN) 2.5 MG tablet      Take 1 tablet by mouth daily or as directed   30 tablet   5   . dicyclomine (BENTYL) 20 MG tablet   Oral   Take 1 tablet (20 mg total) by mouth 2 (two) times daily.   20 tablet   0   . isosorbide mononitrate (IMDUR) 30 MG 24 hr tablet   Oral   Take 30 mg by mouth daily.         . simethicone (MYLICON) 125 MG chewable tablet   Oral   Chew 125 mg by mouth every 6 (six) hours as needed for flatulence.          BP 128/66  Pulse 60  Temp(Src) 97.6 F (36.4 C) (Oral)  Resp 16  SpO2 100% Physical Exam  Constitutional: She is oriented to person, place, and time. She appears well-developed and well-nourished. No distress.  Awake. Alert. Anxious. Rather tangential with conversation. Oriented lucid.  HENT:  Head: Normocephalic.  Eyes: Conjunctivae are normal. Pupils are equal, round, and reactive to light. No scleral icterus.  Neck: Normal range of motion. Neck supple. No thyromegaly present.  Cardiovascular: Normal rate and regular rhythm.  Exam reveals no gallop and no friction rub.   No murmur heard. Pulmonary/Chest: Effort normal and breath sounds normal. No respiratory distress. She has no wheezes. She has no rales.  Abdominal: Soft. Bowel sounds are normal. She exhibits no distension. There is no  tenderness. There is no rebound.  Abdomen soft. No tenderness. She denies any symptoms at this time  Musculoskeletal: Normal range of motion.  Neurological: She is alert and oriented to person, place, and time.  Skin: Skin is warm and dry. No rash noted.  Psychiatric: She has  a normal mood and affect. Her behavior is normal.    ED Course  Procedures (including critical care time) Labs Review Labs Reviewed  COMPREHENSIVE METABOLIC PANEL - Abnormal; Notable for the following:    GFR calc non Af Amer 85 (*)    All other components within normal limits  PROTIME-INR - Abnormal; Notable for the following:    Prothrombin Time 26.2 (*)    INR 2.50 (*)    All other components within normal limits  DIGOXIN LEVEL - Abnormal; Notable for the following:    Digoxin Level 0.6 (*)    All other components within normal limits  URINALYSIS, ROUTINE W REFLEX MICROSCOPIC  CBC WITH DIFFERENTIAL  LIPASE, BLOOD  TROPONIN I  TROPONIN I   Imaging Review No results found.  EKG Interpretation     Ventricular Rate:  62 PR Interval:  188 QRS Duration: 98 QT Interval:  406 QTC Calculation: 412 R Axis:   123 Text Interpretation:  Atrial Fibrillation V paced Abnormal ECG            MDM   1. Abdominal pain   2. Situational anxiety      Benign abdomen. Sounds like there is a lot of anxiety with her abdomen this may be some functional irritable bowel syndrome. Plan is x-ray for stool burden labs and urine. Asymptomatic currently. We'll obtain 0 into our cardiac markers. EKG is unchanged. A. fib. Intermittently V. Paced.   Roney Marion, MD 10/08/13 [phone]

## 2013-09-30 NOTE — ED Notes (Signed)
At church left upper abd pain, nausea.  Pain decreased 4/10, no nausea.

## 2013-09-30 NOTE — ED Notes (Signed)
Cp and upper abd pain that started this am

## 2013-10-01 ENCOUNTER — Telehealth: Payer: Self-pay | Admitting: *Deleted

## 2013-10-01 ENCOUNTER — Ambulatory Visit (INDEPENDENT_AMBULATORY_CARE_PROVIDER_SITE_OTHER): Payer: Medicare Other | Admitting: *Deleted

## 2013-10-01 DIAGNOSIS — I4891 Unspecified atrial fibrillation: Secondary | ICD-10-CM

## 2013-10-01 DIAGNOSIS — Z7901 Long term (current) use of anticoagulants: Secondary | ICD-10-CM

## 2013-10-01 DIAGNOSIS — I482 Chronic atrial fibrillation, unspecified: Secondary | ICD-10-CM

## 2013-10-01 NOTE — Telephone Encounter (Signed)
LMOM for caller and pt.

## 2013-10-01 NOTE — Telephone Encounter (Signed)
Patient was seen at ER yesterday and her INR was checked there.  Please call with instructions. / tgs

## 2013-10-01 NOTE — Telephone Encounter (Signed)
See coumadin note. 

## 2013-10-03 ENCOUNTER — Telehealth: Payer: Self-pay | Admitting: Internal Medicine

## 2013-10-03 NOTE — Telephone Encounter (Signed)
PT HAS JAN RECALL FOR DR C/MT °

## 2013-10-04 ENCOUNTER — Other Ambulatory Visit: Payer: Self-pay

## 2013-10-22 ENCOUNTER — Ambulatory Visit (INDEPENDENT_AMBULATORY_CARE_PROVIDER_SITE_OTHER): Payer: Medicare Other | Admitting: *Deleted

## 2013-10-22 DIAGNOSIS — I482 Chronic atrial fibrillation, unspecified: Secondary | ICD-10-CM

## 2013-10-22 DIAGNOSIS — Z7901 Long term (current) use of anticoagulants: Secondary | ICD-10-CM

## 2013-10-22 DIAGNOSIS — I4891 Unspecified atrial fibrillation: Secondary | ICD-10-CM

## 2013-10-22 LAB — POCT INR: INR: 2.7

## 2013-11-08 ENCOUNTER — Telehealth: Payer: Self-pay | Admitting: Internal Medicine

## 2013-11-08 NOTE — Telephone Encounter (Signed)
lmm @ 226pm to set up device ck due in [place], offered taylor in  [place]/mt

## 2013-11-14 ENCOUNTER — Ambulatory Visit (INDEPENDENT_AMBULATORY_CARE_PROVIDER_SITE_OTHER): Payer: Medicare Other | Admitting: *Deleted

## 2013-11-14 DIAGNOSIS — I482 Chronic atrial fibrillation, unspecified: Secondary | ICD-10-CM

## 2013-11-14 DIAGNOSIS — I4891 Unspecified atrial fibrillation: Secondary | ICD-10-CM

## 2013-11-14 DIAGNOSIS — Z7901 Long term (current) use of anticoagulants: Secondary | ICD-10-CM

## 2013-11-14 LAB — POCT INR: INR: 2.4

## 2013-11-24 ENCOUNTER — Encounter (HOSPITAL_COMMUNITY): Payer: Self-pay | Admitting: Emergency Medicine

## 2013-11-24 ENCOUNTER — Emergency Department (HOSPITAL_COMMUNITY)
Admission: EM | Admit: 2013-11-24 | Discharge: 2013-11-24 | Disposition: A | Discharge status: Home / Self Care | Payer: Medicare Other | Source: Home / Self Care | Attending: Family Medicine | Admitting: Family Medicine

## 2013-11-24 DIAGNOSIS — L600 Ingrowing nail: Secondary | ICD-10-CM

## 2013-11-24 NOTE — ED Notes (Signed)
Pt  Reports pain  And  Swelling  l   Big  Toe          Pt  Has    Redness   And  l  Toe  Appears  Infected              Symptoms  X    4  Days

## 2013-11-24 NOTE — ED Provider Notes (Signed)
CSN: [id number]     Arrival date & time 11/24/13  0901 History   First MD Initiated Contact with Patient 11/24/13 0935     Chief Complaint  Patient presents with  . Toe Pain   (Consider location/radiation/quality/duration/timing/severity/associated sxs/prior Treatment) Patient is a 79 y.o. female presenting with toe pain. The history is provided by the patient.  Toe Pain This is a new problem. The current episode started more than 1 week ago. The problem has been gradually worsening. Associated symptoms comments: Swelling ,redness.    Past Medical History  Diagnosis Date  . Coronary artery disease   . A-fib   . Stroke   . Pacemaker   . Arthritis   . Diabetes mellitus   . Thyroid disease   . Anxiety   . Polio   . Hx of echocardiogram 09/2012    showed Ef 35-40% with no significant valve disease except for mild to moderate MR. normal RVSP and left atrium was severly dilated.  Marland Kitchen History of stress test 08/2011    negative for ischemia it was nongated and nondiagnostic and compatible with a possible scar or peri-infraction ischemia.    Past Surgical History  Procedure Laterality Date  . Cardiac surgery    . 3 cardiac stents      she has had non-DES stent to the mid circumflex in 2009 and LAD stent to the mid-distal LAD  BMS June 18, 2010  . Abdominal hysterectomy  1978  . Tonsillectomy    . Appendectomy    . Pacemaker placement  08/11/2010    Medtronic Adapta   . Coronary angioplasty     Family History  Problem Relation Age of Onset  . Heart disease Father   . Cancer Mother    History  Substance Use Topics  . Smoking status: Never Smoker   . Smokeless tobacco: Never Used  . Alcohol Use: No   OB History   Grav Para Term Preterm Abortions TAB SAB Ect Mult Living                 Review of Systems  Constitutional: Negative.   Skin: Positive for rash.    Allergies  Iohexol; Penicillins; and Sulfur  Home Medications   Current Outpatient Rx  Name  Route  Sig   Dispense  Refill  . bag balm OINT ointment   Topical   Apply 1 application topically daily as needed (pain).          Marland Kitchen dicyclomine (BENTYL) 20 MG tablet   Oral   Take 1 tablet (20 mg total) by mouth 2 (two) times daily.   20 tablet   0   . digoxin (LANOXIN) 0.125 MG tablet   Oral   Take 0.125 mg by mouth daily.         . isosorbide mononitrate (IMDUR) 30 MG 24 hr tablet   Oral   Take 30 mg by mouth daily.         Marland Kitchen levothyroxine (SYNTHROID, LEVOTHROID) 100 MCG tablet   Oral   Take 100 mcg by mouth daily.         Marland Kitchen losartan (COZAAR) 50 MG tablet   Oral   Take 50 mg by mouth daily.          . metoprolol succinate (TOPROL-XL) 100 MG 24 hr tablet   Oral   Take 150 mg by mouth daily. Take with or immediately following a meal.         . simethicone (MYLICON)  125 MG chewable tablet   Oral   Chew 125 mg by mouth every 6 (six) hours as needed for flatulence.         . traMADol (ULTRAM) 50 MG tablet   Oral   Take 25-50 mg by mouth every 6 (six) hours as needed for pain.          Marland Kitchen warfarin (COUMADIN) 2.5 MG tablet      Take 1 tablet by mouth daily or as directed   30 tablet   5    BP 141/79  Pulse 81  Temp(Src) 98.3 F (36.8 C) (Oral)  Resp 18  SpO2 100% Physical Exam  Nursing note and vitals reviewed. Constitutional: She is oriented to person, place, and time. She appears well-developed and well-nourished.  Musculoskeletal: She exhibits tenderness.  Ingrown left gt toenail, fluctuant,   Neurological: She is alert and oriented to person, place, and time.  Skin: Skin is warm and dry. Rash noted. There is erythema.  Stasis derm of both feet    ED Course  NAIL REMOVAL Date/Time: 11/24/2013 9:41 AM Performed by: Linna Hoff Authorized by: Bradd Canary D Consent: Verbal consent obtained. Risks and benefits: risks, benefits and alternatives were discussed Consent given by: patient Location: left foot Location details: left big  toe Anesthesia: local infiltration Local anesthetic: bupivacaine 0.5% without epinephrine Patient sedated: no Preparation: skin prepped with Betadine Amount removed: complete Wedge excision of skin of nail fold: no Nail bed sutured: no Nail matrix removed: none Removed nail replaced and anchored: no Dressing: antibiotic ointment and Xeroform gauze Patient tolerance: Patient tolerated the procedure well with no immediate complications.   (including critical care time) Labs Review Labs Reviewed - No data to display Imaging Review No results found.  EKG Interpretation    Date/Time:    Ventricular Rate:    PR Interval:    QRS Duration:   QT Interval:    QTC Calculation:   R Axis:     Text Interpretation:              MDM  Toenail removed    Linna Hoff, MD 11/24/13 [id number]

## 2013-12-05 ENCOUNTER — Ambulatory Visit: Payer: Self-pay | Admitting: Podiatry

## 2013-12-05 ENCOUNTER — Other Ambulatory Visit: Payer: Self-pay | Admitting: Cardiovascular Disease

## 2013-12-05 NOTE — Telephone Encounter (Signed)
Rx was sent to pharmacy electronically. 

## 2013-12-06 ENCOUNTER — Telehealth: Payer: Self-pay | Admitting: Cardiovascular Disease

## 2013-12-06 MED ORDER — METOPROLOL SUCCINATE ER 100 MG PO TB24: 100.0000 mg | ORAL_TABLET | Freq: Every day | ORAL | Status: DC

## 2013-12-06 NOTE — Telephone Encounter (Signed)
Would like to speak to someone about Mrs.Kundinger medications .Marland KitchenPlease Call   Thanks

## 2013-12-06 NOTE — Telephone Encounter (Signed)
Returned call and pt verified x 2 w/ Dorinda Hill, pt's husband.  Stated since Jan 1, metoprolol was put on a different tier and it is costing a lot.  Stated he was told they could get a 90-day supply or prescribe a different.  Stated he prefers the 90-day Rx.  Informed RN will send in script.  Husband stated number is [phone].  Did not know name of pharmacy. ([facility])  Husband confirmed pt take 100 mg daily.  Refill(s) sent to pharmacy.   [facility].  [facility] and sent Rx to [facility] as requested.

## 2013-12-10 ENCOUNTER — Ambulatory Visit (INDEPENDENT_AMBULATORY_CARE_PROVIDER_SITE_OTHER): Payer: Medicare Other | Admitting: *Deleted

## 2013-12-10 DIAGNOSIS — Z7901 Long term (current) use of anticoagulants: Secondary | ICD-10-CM

## 2013-12-10 DIAGNOSIS — I4891 Unspecified atrial fibrillation: Secondary | ICD-10-CM

## 2013-12-10 DIAGNOSIS — I482 Chronic atrial fibrillation, unspecified: Secondary | ICD-10-CM

## 2013-12-10 LAB — POCT INR: INR: 2.5

## 2013-12-11 ENCOUNTER — Ambulatory Visit: Payer: Self-pay | Admitting: Podiatry

## 2013-12-12 ENCOUNTER — Encounter: Payer: Self-pay | Admitting: Podiatrist

## 2013-12-12 ENCOUNTER — Ambulatory Visit (INDEPENDENT_AMBULATORY_CARE_PROVIDER_SITE_OTHER): Payer: Medicare Other | Admitting: Podiatrist

## 2013-12-12 VITALS — BP 128/70 | HR 66 | Resp 16 | Ht 69.0 in | Wt 169.0 lb

## 2013-12-12 DIAGNOSIS — L03039 Cellulitis of unspecified toe: Secondary | ICD-10-CM

## 2013-12-12 NOTE — Progress Notes (Signed)
   Subjective:    Patient ID: [name], female    DOB: [birth year], 80 y.o.   MRN: [id number]  HPI Comments: "I was told to follow about the toe"  Pt states that 1st toe left foot got infected and went to Urgent Care on 11-26-13. They removed the toenail and it has healed. Not painful. Pt was told to call and have follow up with [place].   Pt also states that her feet stay red and tight feeling a lot.   Toe Pain       Review of Systems  Constitutional: Positive for unexpected weight change.  Cardiovascular: Positive for leg swelling.  Endocrine: Positive for polyuria.  Genitourinary: Positive for frequency.  Hematological:       Slow to heal  All other systems reviewed and are negative.       Objective:   Physical Exam GENERAL APPEARANCE: Alert, conversant. Appropriately groomed. No acute distress.  VASCULAR: Pedal pulses palpable and strong bilateral.  Capillary refill time is immediate to all digits,  Proximal to distal cooling it warm to warm.  Digital hair growth is present bilateral  NEUROLOGIC: sensation is intact epicritically and protectively to 5.07 monofilament at 5/5 sites bilateral.  Light touch is intact bilateral, vibratory sensation intact bilateral, achilles tendon reflex is intact bilateral.  MUSCULOSKELETAL: acceptable muscle strength, tone and stability bilateral.  Intrinsic muscluature intact bilateral.  Rectus appearance of foot and digits noted bilateral.   DERMATOLOGIC: Left hallux nail is healing well. No redness, swelling, streaking, signs of infection are present. Overall excellent appearance and healing of the toe is seen. Generalized skin color, texture, and turger are within normal limits.  No preulcerative lesions are seen, no interdigital maceration noted.  No open lesions present.  Digital nails are asymptomatic.     Assessment & Plan:  Status post removal of left hallux nail and a nonpermanent fashion  Plan: Discussed with the patient and her  husband that the toe looks great. Recommended continued cleansing with soap and water and keep in the toe covered until the nail begins to grow again. Discussed if it starts to give her trouble again she can call and we can perform a procedure to prevent the nail from regrowing. She'll be seen back when necessary

## 2013-12-12 NOTE — Patient Instructions (Signed)
Diabetes and Foot Care Diabetes may cause you to have problems because of poor blood supply (circulation) to your feet and legs. This may cause the skin on your feet to become thinner, break easier, and heal more slowly. Your skin may become dry, and the skin may peel and crack. You may also have nerve damage in your legs and feet causing decreased feeling in them. You may not notice minor injuries to your feet that could lead to infections or more serious problems. Taking care of your feet is one of the most important things you can do for yourself.  HOME CARE INSTRUCTIONS  Wear shoes at all times, even in the house. Do not go barefoot. Bare feet are easily injured.  Check your feet daily for blisters, cuts, and redness. If you cannot see the bottom of your feet, use a mirror or ask someone for help.  Wash your feet with warm water (do not use hot water) and mild soap. Then pat your feet and the areas between your toes until they are completely dry. Do not soak your feet as this can dry your skin.  Apply a moisturizing lotion or petroleum jelly (that does not contain alcohol and is unscented) to the skin on your feet and to dry, brittle toenails. Do not apply lotion between your toes.  Trim your toenails straight across. Do not dig under them or around the cuticle. File the edges of your nails with an emery board or nail file.  Do not cut corns or calluses or try to remove them with medicine.  Wear clean socks or stockings every day. Make sure they are not too tight. Do not wear knee-high stockings since they may decrease blood flow to your legs.  Wear shoes that fit properly and have enough cushioning. To break in new shoes, wear them for just a few hours a day. This prevents you from injuring your feet. Always look in your shoes before you put them on to be sure there are no objects inside.  Do not cross your legs. This may decrease the blood flow to your feet.  If you find a minor scrape,  cut, or break in the skin on your feet, keep it and the skin around it clean and dry. These areas may be cleansed with mild soap and water. Do not cleanse the area with peroxide, alcohol, or iodine.  When you remove an adhesive bandage, be sure not to damage the skin around it.  If you have a wound, look at it several times a day to make sure it is healing.  Do not use heating pads or hot water bottles. They may burn your skin. If you have lost feeling in your feet or legs, you may not know it is happening until it is too late.  Make sure your health care provider performs a complete foot exam at least annually or more often if you have foot problems. Report any cuts, sores, or bruises to your health care provider immediately. SEEK MEDICAL CARE IF:   You have an injury that is not healing.  You have cuts or breaks in the skin.  You have an ingrown nail.  You notice redness on your legs or feet.  You feel burning or tingling in your legs or feet.  You have pain or cramps in your legs and feet.  Your legs or feet are numb.  Your feet always feel cold. SEEK IMMEDIATE MEDICAL CARE IF:   There is increasing redness,   swelling, or pain in or around a wound.  There is a red line that goes up your leg.  Pus is coming from a wound.  You develop a fever or as directed by your health care provider.  You notice a bad smell coming from an ulcer or wound. Document Released: 11/12/2000 Document Revised: 07/18/2013 Document Reviewed: 04/24/2013 Exit[employer] Patient Information 2014 [employer].  

## 2013-12-31 ENCOUNTER — Telehealth: Payer: Self-pay | Admitting: Cardiovascular Disease

## 2013-12-31 MED ORDER — METOPROLOL TARTRATE 50 MG PO TABS: 50.0000 mg | ORAL_TABLET | Freq: Two times a day (BID) | ORAL | Status: DC

## 2013-12-31 NOTE — Telephone Encounter (Signed)
Please call-need to talk to you about her Metoprolol.

## 2013-12-31 NOTE — Telephone Encounter (Signed)
Patient calling in regards to cost of medications. Reports that her metoprolol succinate has increased from $3 to $22, along with a few other medications. Wanted to know if could be switched to another beta blocker that may be less expensive, as patient is on fixed income. Informed patient that Dr. Tresa Endo, his nurse Burna Mortimer, and pharmacist will be notified and she will be contacted with further information regarding potential med changes.

## 2013-12-31 NOTE — Telephone Encounter (Signed)
Spoke with husband, explained that will switch to tartrate formulation, 50mg  bid.  Asked him to call if this was also too expensive.  Husband voiced understanding.

## 2013-12-31 NOTE — Telephone Encounter (Signed)
Ok to switch to tatrate

## 2014-01-02 ENCOUNTER — Encounter: Payer: Self-pay | Admitting: *Deleted

## 2014-01-07 ENCOUNTER — Ambulatory Visit (INDEPENDENT_AMBULATORY_CARE_PROVIDER_SITE_OTHER): Payer: Medicare Other | Admitting: *Deleted

## 2014-01-07 DIAGNOSIS — Z7901 Long term (current) use of anticoagulants: Secondary | ICD-10-CM

## 2014-01-07 DIAGNOSIS — I4891 Unspecified atrial fibrillation: Secondary | ICD-10-CM

## 2014-01-07 DIAGNOSIS — Z5181 Encounter for therapeutic drug level monitoring: Secondary | ICD-10-CM

## 2014-01-07 DIAGNOSIS — I482 Chronic atrial fibrillation, unspecified: Secondary | ICD-10-CM

## 2014-01-07 LAB — POCT INR: INR: 2.4

## 2014-01-09 ENCOUNTER — Telehealth: Payer: Self-pay | Admitting: Cardiovascular Disease

## 2014-01-09 NOTE — Telephone Encounter (Signed)
FORWARD to [employer]

## 2014-01-09 NOTE — Telephone Encounter (Signed)
Pt has bronchitis,need to know what she can take that will not interferr with any of her medicine.Primary prescribed some medicine,but all of it had an interaction with her medicine.

## 2014-01-10 NOTE — Telephone Encounter (Signed)
Spoke with son, pt was given rx for bronchitis, but pharmacy would not fill because pt takes digoxin.  So MD was notified and pt given new rx for zpak, but again pharmacy did not want to fill, this time because pt on warfarin.  Son was upset, wants to get her on antibiotic before pt develops pneumonia.  I assured him that a zpak with be fine with her warfarin, we have not had any patients develop supratherapeutic INRs with it.  He will go to pharmacy and pick up now.

## 2014-01-16 ENCOUNTER — Telehealth: Payer: Self-pay | Admitting: *Deleted

## 2014-01-16 NOTE — Telephone Encounter (Signed)
Patient states that she needs to speak with Misty Stanley regarding reaction to medication / tgs

## 2014-01-16 NOTE — Telephone Encounter (Signed)
Pt was started on levaqin 500mg  qd x 10 days and prednisone taper on 2/16 for bronchitis.  Took prednisone x 2 days and had a reaction on the 3rd day.  (face flushed, elevated HR)  Pharmacy warned pt about taking Abx with coumadin.  Told pt to decrease coumadin dose to 1/2 tablet daily till finished with Levaquin.  She has appt with MD tomorrow.  Told her to discuss Prednisone with her.  She is not going to take anymore till then.  INR appt made for 01/24/14.  Pt verbalized understanding.

## 2014-01-28 ENCOUNTER — Ambulatory Visit (INDEPENDENT_AMBULATORY_CARE_PROVIDER_SITE_OTHER): Payer: Medicare Other | Admitting: *Deleted

## 2014-01-28 DIAGNOSIS — Z5181 Encounter for therapeutic drug level monitoring: Secondary | ICD-10-CM

## 2014-01-28 DIAGNOSIS — I482 Chronic atrial fibrillation, unspecified: Secondary | ICD-10-CM

## 2014-01-28 DIAGNOSIS — Z7901 Long term (current) use of anticoagulants: Secondary | ICD-10-CM

## 2014-01-28 DIAGNOSIS — I4891 Unspecified atrial fibrillation: Secondary | ICD-10-CM

## 2014-01-28 LAB — POCT INR: INR: 1.8

## 2014-02-14 ENCOUNTER — Encounter (INDEPENDENT_AMBULATORY_CARE_PROVIDER_SITE_OTHER): Payer: Self-pay | Admitting: Internal Medicine

## 2014-02-14 ENCOUNTER — Ambulatory Visit (INDEPENDENT_AMBULATORY_CARE_PROVIDER_SITE_OTHER): Payer: Medicare Other | Admitting: Internal Medicine

## 2014-02-14 VITALS — BP 122/58 | HR 76 | Ht 69.0 in | Wt 169.6 lb

## 2014-02-14 DIAGNOSIS — R195 Other fecal abnormalities: Secondary | ICD-10-CM

## 2014-02-14 DIAGNOSIS — R52 Pain, unspecified: Secondary | ICD-10-CM

## 2014-02-14 DIAGNOSIS — R1011 Right upper quadrant pain: Secondary | ICD-10-CM

## 2014-02-14 DIAGNOSIS — R1031 Right lower quadrant pain: Secondary | ICD-10-CM

## 2014-02-14 DIAGNOSIS — G8929 Other chronic pain: Secondary | ICD-10-CM | POA: Insufficient documentation

## 2014-02-14 DIAGNOSIS — R1032 Left lower quadrant pain: Secondary | ICD-10-CM | POA: Insufficient documentation

## 2014-02-14 NOTE — Patient Instructions (Signed)
CT abdominal/pelvis with CM. Stool softener daily.

## 2014-02-14 NOTE — Progress Notes (Signed)
Subjective:     Patient ID: [name], female   DOB: 09/26/1930, 80 y.o.   MRN: [id number]  HPIPresents today with c/o lower abdominal pain/cramps. She c/o constipation though she has the urge. Symptoms started about 6 days ago. She actually says she has had these symptoms for a long time but they have gotten worse.  No fever. She is having a BM daily. Her BMs are small. She rates the abdominal pain at its worse 10/10 when she has them.  He appetite has not been good. She tells me she has been losing weight. She has lost about 2.5 pounds since her last visit in February in 2014.  Sometimes when she has BM, and she strains she will see blood when she wipes.   She was scheduled for a virtual colonoscopy but did not have done.  She was undergone for a recent bout of diverticulitis and to rule out an underlying cancer.  11/08/2012 CT abdomen/pelvis with CM:   IMPRESSION:  Segmental wall thickening inflammatory changes in the sigmoid colon  with diverticulosis. Changes are consistent with diverticulitis.  Underlying mass lesion not excluded and follow up after resolution  of acute process is recommended. Stable appearance of benign  appearing cyst in the right abdomen and fatty infiltration of the  pancreas.  Colonoscopy 03/21/2000 Exam limited to splenic flexure. Left colonic diverticulosis. No evidence of polyps or tumor mass in the segments examined. CBC    Component Value Date/Time   WBC 7.1 09/30/2013 1255   RBC 4.63 09/30/2013 1255   HGB 13.7 09/30/2013 1255   HCT 40.8 09/30/2013 1255   PLT 204 09/30/2013 1255   MCV 88.1 09/30/2013 1255   MCH 29.6 09/30/2013 1255   MCHC 33.6 09/30/2013 1255   RDW 14.1 09/30/2013 1255   LYMPHSABS 1.8 09/30/2013 1255   MONOABS 0.8 09/30/2013 1255   EOSABS 0.2 09/30/2013 1255   BASOSABS 0.0 09/30/2013 1255   CMP     Component Value Date/Time   NA 138 09/30/2013 1255   K 4.2 09/30/2013 1255   CL 102 09/30/2013 1255   CO2 30 09/30/2013 1255   GLUCOSE 95  09/30/2013 1255   BUN 17 09/30/2013 1255   CREATININE 0.56 09/30/2013 1255   CALCIUM 9.1 09/30/2013 1255   PROT 6.3 09/30/2013 1255   ALBUMIN 3.5 09/30/2013 1255   AST 13 09/30/2013 1255   ALT 11 09/30/2013 1255   ALKPHOS 75 09/30/2013 1255   BILITOT 0.6 09/30/2013 1255   GFRNONAA 85* 09/30/2013 1255   GFRAA >90 09/30/2013 1255        Review of Systems see hpi Past Medical History  Diagnosis Date  . Coronary artery disease   . A-fib   . Stroke   . Pacemaker   . Arthritis   . Diabetes mellitus     x 10 yrs at least  . Thyroid disease   . Anxiety   . Polio   . Hx of echocardiogram 09/2012    showed Ef 35-40% with no significant valve disease except for mild to moderate MR. normal RVSP and left atrium was severly dilated.  Marland Kitchen. History of stress test 08/2011    negative for ischemia it was nongated and nondiagnostic and compatible with a possible scar or peri-infraction ischemia.     Past Surgical History  Procedure Laterality Date  . Cardiac surgery    . 3 cardiac stents      she has had non-DES stent to the mid circumflex in 2009  and LAD stent to the mid-distal LAD  BMS June 18, 2010  . Abdominal hysterectomy  1978  . Tonsillectomy    . Appendectomy    . Pacemaker placement  08/11/2010    Medtronic Adapta   . Coronary angioplasty      Allergies  Allergen Reactions  . Iohexol Other (See Comments)     Desc: CHEST PAIN, LOC AFTER HEART CATH, PT REFUSED DYE   . Penicillins Other (See Comments)    REACTION:  unknown  . Sulfur Other (See Comments)    REACTION:  unknown    Current Outpatient Prescriptions on File Prior to Visit  Medication Sig Dispense Refill  . bag balm OINT ointment Apply 1 application topically daily as needed (pain).       Marland Kitchen digoxin (LANOXIN) 0.125 MG tablet Take 0.125 mg by mouth daily.      . isosorbide mononitrate (IMDUR) 30 MG 24 hr tablet TAKE 1 TABLET BY MOUTH EVERY DAY  30 tablet  5  . levothyroxine (SYNTHROID, LEVOTHROID) 100 MCG tablet Take 100  mcg by mouth daily.      Marland Kitchen losartan (COZAAR) 50 MG tablet Take 50 mg by mouth daily.       . metoprolol (LOPRESSOR) 50 MG tablet Take 1 tablet (50 mg total) by mouth 2 (two) times daily.  60 tablet  3  . simethicone (MYLICON) 125 MG chewable tablet Chew 125 mg by mouth every 6 (six) hours as needed for flatulence.      . traMADol (ULTRAM) 50 MG tablet Take 25-50 mg by mouth every 6 (six) hours as needed for pain.        No current facility-administered medications on file prior to visit.         Objective:   Physical Exam  Filed Vitals:   02/14/14 1415  BP: 122/58  Pulse: 76  Height: 5\' 9"  (1.753 m)  Weight: 169 lb 9.6 oz (76.93 kg)   Alert and oriented. Skin warm and dry. Oral mucosa is moist.   . Sclera anicteric, conjunctivae is pink. Thyroid not enlarged. No cervical lymphadenopathy. Lungs clear. Heart regular rate and rhythm.  Abdomen is soft. Bowel sounds are positive. No hepatomegaly. No abdominal masses felt. No tenderness.  No edema to lower extremities. Stool brown and guaiac negative.     Assessment:    Lower abdominal pain. Change in stools. I want to make sure she doesn't have diverticulitis. She cancelled the Virtual colonoscopy.    Plan:    CT abdomen/pelvis with CM. She is going to need a virtual colonoscopy

## 2014-02-15 LAB — CREATININE, SERUM: CREATININE: 0.58 mg/dL (ref 0.50–1.10)

## 2014-02-18 ENCOUNTER — Ambulatory Visit (INDEPENDENT_AMBULATORY_CARE_PROVIDER_SITE_OTHER): Payer: Medicare Other | Admitting: *Deleted

## 2014-02-18 DIAGNOSIS — I482 Chronic atrial fibrillation, unspecified: Secondary | ICD-10-CM

## 2014-02-18 DIAGNOSIS — Z7901 Long term (current) use of anticoagulants: Secondary | ICD-10-CM

## 2014-02-18 DIAGNOSIS — I4891 Unspecified atrial fibrillation: Secondary | ICD-10-CM

## 2014-02-18 DIAGNOSIS — Z5181 Encounter for therapeutic drug level monitoring: Secondary | ICD-10-CM

## 2014-02-18 LAB — POCT INR: INR: 3.1

## 2014-02-19 ENCOUNTER — Ambulatory Visit (HOSPITAL_COMMUNITY)
Admission: RE | Admit: 2014-02-19 | Discharge: 2014-02-19 | Disposition: A | Discharge status: Home / Self Care | Payer: Medicare Other | Source: Ambulatory Visit | Attending: Internal Medicine | Admitting: Internal Medicine

## 2014-02-19 ENCOUNTER — Other Ambulatory Visit (INDEPENDENT_AMBULATORY_CARE_PROVIDER_SITE_OTHER): Payer: Self-pay | Admitting: Internal Medicine

## 2014-02-19 DIAGNOSIS — K5732 Diverticulitis of large intestine without perforation or abscess without bleeding: Secondary | ICD-10-CM | POA: Insufficient documentation

## 2014-02-19 DIAGNOSIS — R1011 Right upper quadrant pain: Secondary | ICD-10-CM

## 2014-02-19 DIAGNOSIS — R1032 Left lower quadrant pain: Secondary | ICD-10-CM

## 2014-02-19 DIAGNOSIS — R935 Abnormal findings on diagnostic imaging of other abdominal regions, including retroperitoneum: Secondary | ICD-10-CM | POA: Insufficient documentation

## 2014-02-19 DIAGNOSIS — K5730 Diverticulosis of large intestine without perforation or abscess without bleeding: Secondary | ICD-10-CM | POA: Insufficient documentation

## 2014-02-19 DIAGNOSIS — R109 Unspecified abdominal pain: Secondary | ICD-10-CM | POA: Insufficient documentation

## 2014-02-21 ENCOUNTER — Telehealth (INDEPENDENT_AMBULATORY_CARE_PROVIDER_SITE_OTHER): Payer: Medicare Other | Admitting: Internal Medicine

## 2014-02-21 ENCOUNTER — Encounter (INDEPENDENT_AMBULATORY_CARE_PROVIDER_SITE_OTHER): Payer: Self-pay | Admitting: *Deleted

## 2014-02-21 ENCOUNTER — Telehealth (INDEPENDENT_AMBULATORY_CARE_PROVIDER_SITE_OTHER): Payer: Self-pay | Admitting: Internal Medicine

## 2014-02-21 DIAGNOSIS — K5732 Diverticulitis of large intestine without perforation or abscess without bleeding: Secondary | ICD-10-CM

## 2014-02-21 NOTE — Addendum Note (Signed)
Addended by: Len Blalock on: 02/21/2014 02:23 PM   Modules accepted: Orders

## 2014-02-21 NOTE — Telephone Encounter (Signed)
Opened in error

## 2014-02-21 NOTE — Telephone Encounter (Signed)
BE sch'd 02/28/14 @ 1200 (1145), patient's husband aware of appt and will stop by office to pick up instructions sheet

## 2014-02-28 ENCOUNTER — Other Ambulatory Visit: Payer: Self-pay

## 2014-02-28 ENCOUNTER — Ambulatory Visit (HOSPITAL_COMMUNITY)
Admission: RE | Admit: 2014-02-28 | Discharge: 2014-02-28 | Disposition: A | Discharge status: Home / Self Care | Payer: Medicare Other | Source: Ambulatory Visit | Attending: Internal Medicine | Admitting: Internal Medicine

## 2014-02-28 ENCOUNTER — Other Ambulatory Visit (HOSPITAL_COMMUNITY): Payer: Medicare Other

## 2014-02-28 ENCOUNTER — Other Ambulatory Visit (INDEPENDENT_AMBULATORY_CARE_PROVIDER_SITE_OTHER): Payer: Self-pay | Admitting: Internal Medicine

## 2014-02-28 DIAGNOSIS — K5730 Diverticulosis of large intestine without perforation or abscess without bleeding: Secondary | ICD-10-CM | POA: Insufficient documentation

## 2014-02-28 DIAGNOSIS — K5732 Diverticulitis of large intestine without perforation or abscess without bleeding: Secondary | ICD-10-CM

## 2014-02-28 MED ORDER — DIGOXIN 125 MCG PO TABS: 0.1250 mg | ORAL_TABLET | Freq: Every day | ORAL | Status: DC

## 2014-02-28 NOTE — Telephone Encounter (Signed)
Rx was sent to pharmacy electronically. 

## 2014-03-05 ENCOUNTER — Encounter (INDEPENDENT_AMBULATORY_CARE_PROVIDER_SITE_OTHER): Payer: Self-pay | Admitting: Internal Medicine

## 2014-03-05 ENCOUNTER — Other Ambulatory Visit: Payer: Self-pay

## 2014-03-05 ENCOUNTER — Ambulatory Visit (INDEPENDENT_AMBULATORY_CARE_PROVIDER_SITE_OTHER): Payer: Medicare Other | Admitting: Internal Medicine

## 2014-03-05 ENCOUNTER — Telehealth (INDEPENDENT_AMBULATORY_CARE_PROVIDER_SITE_OTHER): Payer: Self-pay | Admitting: *Deleted

## 2014-03-05 VITALS — BP 120/58 | HR 64 | Temp 98.2°F | Ht 69.0 in | Wt 168.4 lb

## 2014-03-05 DIAGNOSIS — R109 Unspecified abdominal pain: Secondary | ICD-10-CM

## 2014-03-05 DIAGNOSIS — K5900 Constipation, unspecified: Secondary | ICD-10-CM | POA: Insufficient documentation

## 2014-03-05 MED ORDER — ISOSORBIDE MONONITRATE ER 30 MG PO TB24: 30.0000 mg | ORAL_TABLET | Freq: Every day | ORAL | Status: DC

## 2014-03-05 NOTE — Patient Instructions (Signed)
Dr. Karilyn Cota will talk with Dr. Freida Busman in [facility] concerning virtual colonoscopy Daily stool softner and fiber tabs 4 gms.

## 2014-03-05 NOTE — Progress Notes (Deleted)
Patient ID: [name], female   DOB: December 27, 1929, 80 y.o.   MRN: [id number] Patient is here for f/u after recent BE which was incomplete. She is here today to discuss her options for evaluation of her colon. Xray could not get the tip in her rectum. The study was cancelled. Patient is very concerned about sedation for a colonoscopy. She says her heart rate will go up.  She says she has lower abdominal pain occasionally. A couple of weeks ago, she had lower abdominal pain which doubled her over. The pain lasted 15-20 minutes and occurred off an on. She also had gas pain. Her last normal stool was yesterday.  The only time she will see blood is when she is trying to have a BM and strains.

## 2014-03-05 NOTE — Telephone Encounter (Signed)
Per Dr Karilyn Cota, patient needs OV today with Terri to discuss next step of care -- I spoke to Ms Suleman who gave the phone to her husband. I explained that Dr Karilyn Cota wanted me to call them to give Ms Lefler an appt today with Terri to discuss next step of care, they were adamant that they wanted to see Dr Karilyn Cota, I explained that Camelia Eng was here to help Dr Karilyn Cota see patients and that he asked that the appt be with Terri as he had no openings. Both voiced they understood but this was not the way they wanted it. They will be in office today @ 345 for at 4 pm appt

## 2014-03-05 NOTE — Telephone Encounter (Signed)
Rx was sent to pharmacy electronically. 

## 2014-03-05 NOTE — Progress Notes (Signed)
Subjective:     Patient ID: [name], female   DOB: May 14, 1930, 80 y.o.   MRN: [id number]  HPI  Patient is here for f/u after recent BE which was incomplete. She is here today to discuss her options for evaluation of her colon. Xray could not get the tip in her rectum. The study was cancelled. Patient is very concerned about sedation for a colonoscopy. She says her heart rate will go up.  She says she has lower abdominal pain occasionally. A couple of weeks ago, she had lower abdominal pain which doubled her over. The pain lasted 15-20 minutes and occurred off an on. She also had gas pain. Her last normal stool was yesterday.  The only time she will see blood is when she is trying to have a BM and has to strain. Her stool usually come out in small pieces.   Her last colonoscopy was in 2001 and was incomplete. Exam limited to splenic flexure. Left colonic diverticulosis. No evidence of polyps or tumor mass in the segments that were examined.  Review of Systems Past Medical History  Diagnosis Date  . Coronary artery disease   . A-fib   . Stroke   . Pacemaker   . Arthritis   . Diabetes mellitus     x 10 yrs at least  . Thyroid disease   . Anxiety   . Polio   . Hx of echocardiogram 09/2012    showed Ef 35-40% with no significant valve disease except for mild to moderate MR. normal RVSP and left atrium was severly dilated.  Marland Kitchen History of stress test 08/2011    negative for ischemia it was nongated and nondiagnostic and compatible with a possible scar or peri-infraction ischemia.     Past Surgical History  Procedure Laterality Date  . Cardiac surgery    . 3 cardiac stents      she has had non-DES stent to the mid circumflex in 2009 and LAD stent to the mid-distal LAD  BMS June 18, 2010  . Abdominal hysterectomy  1978  . Tonsillectomy    . Appendectomy    . Pacemaker placement  08/11/2010    Medtronic Adapta   . Coronary angioplasty      Allergies  Allergen Reactions  .  Iohexol Other (See Comments)     Desc: CHEST PAIN, LOC AFTER HEART CATH, PT REFUSED DYE   . Penicillins Other (See Comments)    REACTION:  unknown  . Sulfur Other (See Comments)    REACTION:  unknown    Current Outpatient Prescriptions on File Prior to Visit  Medication Sig Dispense Refill  . bag balm OINT ointment Apply 1 application topically daily as needed (pain).       Marland Kitchen digoxin (LANOXIN) 0.125 MG tablet Take 1 tablet (0.125 mg total) by mouth daily.  30 tablet  2  . levothyroxine (SYNTHROID, LEVOTHROID) 100 MCG tablet Take 100 mcg by mouth daily.      . metoprolol (LOPRESSOR) 50 MG tablet Take 1 tablet (50 mg total) by mouth 2 (two) times daily.  60 tablet  3  . simethicone (MYLICON) 125 MG chewable tablet Chew 125 mg by mouth every 6 (six) hours as needed for flatulence.      . traMADol (ULTRAM) 50 MG tablet Take 25-50 mg by mouth every 6 (six) hours as needed for pain.       Marland Kitchen warfarin (COUMADIN) 2.5 MG tablet Take 1 tablet by mouth daily or as directed  1/2 of Friday       No current facility-administered medications on file prior to visit.        Objective:   Physical Exam  Filed Vitals:   03/05/14 1612  BP: 120/58  Pulse: 64  Temp: 98.2 F (36.8 C)  Height: 5\' 9"  (1.753 m)  Weight: 168 lb 6.4 oz (76.386 kg)   Alert and oriented. Skin warm and dry. Oral mucosa is moist.   . Sclera anicteric, conjunctivae is pink. Thyroid not enlarged. No cervical lymphadenopathy. Lungs clear. Heart regular rate and rhythm.  Abdomen is soft. Bowel sounds are positive. No hepatomegaly. No abdominal masses felt. No tenderness.  No edema to lower extremities.       Assessment:     Constipation:chronic.     Plan:   Dr. Karilyn Cotaehman will talk with Dr. Demetrios LollBansell concerning a virtual colonoscopy. Fiber 4 gms daily. Stool softener daily.

## 2014-03-06 ENCOUNTER — Other Ambulatory Visit: Payer: Self-pay | Admitting: *Deleted

## 2014-03-06 NOTE — Telephone Encounter (Signed)
Rx was filled on 02/28/14

## 2014-03-11 ENCOUNTER — Other Ambulatory Visit (INDEPENDENT_AMBULATORY_CARE_PROVIDER_SITE_OTHER): Payer: Self-pay | Admitting: Internal Medicine

## 2014-03-11 DIAGNOSIS — R194 Change in bowel habit: Secondary | ICD-10-CM

## 2014-03-12 ENCOUNTER — Telehealth: Payer: Self-pay | Admitting: *Deleted

## 2014-03-12 NOTE — Telephone Encounter (Signed)
Pt's husband was calling in regards to [name] having a colonoscopy. They are concerned because she is on heart medication and has a pacemaker. She has questions that she wants answered.  TK

## 2014-03-12 NOTE — Telephone Encounter (Signed)
Returned call and pt verified x 2.  Pt stated she is having a virtual colonoscopy next Tuesday and wanted to know if her heart is okay.  Stated she is nervous b/c they told her they would be using anesthesia and she wants to be sure her heart is okay to do that.  Pt informed she is scheduled for a CT of her colon and this should not be invasive.  Pt stated she is not sure what is is and wants to make sure it's okay to have anesthesia.    Appt scheduled w/ Corine Shelter, PA-C on 4.16.15 at 11:40am for evaluation to ease pt's mind for her procedure as Dr. Tresa Endo is unavailable.  Will copy Dorene Ar, NP who saw pt last in GI.

## 2014-03-14 ENCOUNTER — Ambulatory Visit (INDEPENDENT_AMBULATORY_CARE_PROVIDER_SITE_OTHER): Payer: Medicare Other | Admitting: Cardiology

## 2014-03-14 ENCOUNTER — Encounter: Payer: Self-pay | Admitting: Cardiology

## 2014-03-14 VITALS — BP 122/80 | HR 68 | Ht 69.0 in | Wt 167.0 lb

## 2014-03-14 DIAGNOSIS — I428 Other cardiomyopathies: Secondary | ICD-10-CM

## 2014-03-14 DIAGNOSIS — I2510 Atherosclerotic heart disease of native coronary artery without angina pectoris: Secondary | ICD-10-CM

## 2014-03-14 DIAGNOSIS — Z7901 Long term (current) use of anticoagulants: Secondary | ICD-10-CM

## 2014-03-14 DIAGNOSIS — I429 Cardiomyopathy, unspecified: Secondary | ICD-10-CM

## 2014-03-14 DIAGNOSIS — I495 Sick sinus syndrome: Secondary | ICD-10-CM

## 2014-03-14 DIAGNOSIS — I4891 Unspecified atrial fibrillation: Secondary | ICD-10-CM

## 2014-03-14 DIAGNOSIS — I482 Chronic atrial fibrillation, unspecified: Secondary | ICD-10-CM

## 2014-03-14 NOTE — Progress Notes (Signed)
03/14/2014 [name]   1930-03-21  [id number]  Primary Physicia Azalee Course, CMA Primary Cardiologist: Dr Tresa Endo  HPI:  The patient is an anxious, thin, 80 y/o female followed by Dr Tresa Endo who has known coronary disease with non-DES stenting of the mid circumflex in 2009 and mid-LAD BMS stent in 2011. She had a negative Myoview for ischemia in October of 2012 and a cath in June 2014 showing a widely patent LAD stent. She has CAF and has a Medtronic Adapta SR implanted August 11, 2010. She is on chronic Coumadin. She has had previous cardiomyopathy, not felt to be secondary to CAD. Her most recent EF was 55% at cath June 2014.              She came today to be checked before she has a colonoscopy. She is followed by Dr Karilyn Cota in [place]. Apparently there is a question of a mass and she is understandably quite concerned. She has not had chest pain or unusual SOB. She denies palpitations. She has not been told to stop her Coumadin in preparation for this test.     Current Outpatient Prescriptions  Medication Sig Dispense Refill  . bag balm OINT ointment Apply 1 application topically daily as needed (pain).       Marland Kitchen digoxin (LANOXIN) 0.125 MG tablet Take 1 tablet (0.125 mg total) by mouth daily.  30 tablet  2  . isosorbide mononitrate (IMDUR) 30 MG 24 hr tablet Take 1 tablet (30 mg total) by mouth daily.  30 tablet  2  . levothyroxine (SYNTHROID, LEVOTHROID) 100 MCG tablet Take 100 mcg by mouth daily.      . metoprolol (LOPRESSOR) 50 MG tablet Take 1 tablet (50 mg total) by mouth 2 (two) times daily.  60 tablet  3  . simethicone (MYLICON) 125 MG chewable tablet Chew 125 mg by mouth every 6 (six) hours as needed for flatulence.      . traMADol (ULTRAM) 50 MG tablet Take 25-50 mg by mouth every 6 (six) hours as needed for pain.       Marland Kitchen warfarin (COUMADIN) 2.5 MG tablet Take 1 tablet by mouth daily or as directed 1/2 of Friday       No current facility-administered medications for this  visit.    Allergies  Allergen Reactions  . Iohexol Other (See Comments)     Desc: CHEST PAIN, LOC AFTER HEART CATH, PT REFUSED DYE   . Penicillins Other (See Comments)    REACTION:  unknown  . Sulfur Other (See Comments)    REACTION:  unknown    History   Social History  . Marital Status: Married    Spouse Name: N/A    Number of Children: N/A  . Years of Education: N/A   Occupational History  . Not on file.   Social History Main Topics  . Smoking status: Never Smoker   . Smokeless tobacco: Never Used  . Alcohol Use: No  . Drug Use: No  . Sexual Activity: Not on file   Other Topics Concern  . Not on file   Social History Narrative  . No narrative on file     Review of Systems: General: negative for chills, fever, night sweats or weight changes.  Cardiovascular: negative for chest pain, dyspnea on exertion, edema, orthopnea, palpitations, paroxysmal nocturnal dyspnea or shortness of breath Dermatological: negative for rash Respiratory: negative for cough or wheezing Urologic: negative for hematuria Abdominal: negative for nausea, vomiting, diarrhea, bright  red blood per rectum, melena, or hematemesis Neurologic: negative for visual changes, syncope, or dizziness All other systems reviewed and are otherwise negative except as noted above.    Blood pressure 122/80, pulse 68, height 5\' 9"  (1.753 m), weight 167 lb (75.751 kg).  General appearance: alert, cooperative, cachectic and no distress Neck: no carotid bruit and no JVD Lungs: clear to auscultation bilaterally Heart: irregularly irregular rhythm  EKG AF, POD  ASSESSMENT AND PLAN:   CAD, non DES to CFX '09, LAD BMS 7/11. Cath OK June 2014 No angina, she wanted to be checked before her scheduled colonoscopy  Cardiomyopathy, suspected to be secondary to AF, not CAD 50-55% at cath June 2014 No CHF symptoms  Chronic atrial fibrillation Rate controlled  SSS (sick sinus syndrome)- MDT PTVDP 9/11 She  will need to be set for follow up  Long term (current) use of anticoagulants Coumadin   PLAN  OK from a cardiac standpoint to proceed with colonoscopy. Follow up with Dr Tresa EndoKelly in June as scheduled. She told me someone  had stopped her Losartan because of low B/P. I suggested resuming this at a lower dose but she preferred to discuss this with Dr Tresa EndoKelly in June.  Eda PaschalLuke K Barkley Surgicenter IncKilroyPA-C 03/14/2014 12:16 PM

## 2014-03-14 NOTE — Patient Instructions (Signed)
Follow up with Dr Tresa Endo in June as scheduled

## 2014-03-14 NOTE — Assessment & Plan Note (Signed)
Rate controlled 

## 2014-03-14 NOTE — Assessment & Plan Note (Signed)
No CHF symptoms 

## 2014-03-14 NOTE — Assessment & Plan Note (Signed)
No angina, she wanted to be checked before her scheduled colonoscopy

## 2014-03-14 NOTE — Assessment & Plan Note (Signed)
She will need to be set for follow up

## 2014-03-14 NOTE — Assessment & Plan Note (Signed)
Coumadin 

## 2014-03-18 ENCOUNTER — Ambulatory Visit (INDEPENDENT_AMBULATORY_CARE_PROVIDER_SITE_OTHER): Payer: Medicare Other | Admitting: *Deleted

## 2014-03-18 DIAGNOSIS — Z5181 Encounter for therapeutic drug level monitoring: Secondary | ICD-10-CM

## 2014-03-18 DIAGNOSIS — Z7901 Long term (current) use of anticoagulants: Secondary | ICD-10-CM

## 2014-03-18 DIAGNOSIS — I482 Chronic atrial fibrillation, unspecified: Secondary | ICD-10-CM

## 2014-03-18 DIAGNOSIS — I4891 Unspecified atrial fibrillation: Secondary | ICD-10-CM

## 2014-03-18 LAB — POCT INR: INR: 2.9

## 2014-03-19 ENCOUNTER — Ambulatory Visit
Admission: RE | Admit: 2014-03-19 | Discharge: 2014-03-19 | Disposition: A | Discharge status: Home / Self Care | Payer: Medicare Other | Source: Ambulatory Visit | Attending: Internal Medicine | Admitting: Internal Medicine

## 2014-03-19 DIAGNOSIS — R194 Change in bowel habit: Secondary | ICD-10-CM

## 2014-03-20 ENCOUNTER — Telehealth (INDEPENDENT_AMBULATORY_CARE_PROVIDER_SITE_OTHER): Payer: Self-pay | Admitting: *Deleted

## 2014-03-20 NOTE — Telephone Encounter (Signed)
Husband called and left message at 1:26 pm  Returned call at 2:15 pm   They are wondering about who will call her with results of virtual colonoscopy since done in [place].  Told them you will call them with results  She is doing fair--tired from doing prep. Still hurting off on in lower part of abdomen Still going some to have bowel movements quite often.  Feels no way anything should be left inside her.  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  So they will not be home during that time.  Of course she is anxious to know the results whenever you get a chance to call them.  Very pleasant couple to speak to and are very appreciative of you help

## 2014-03-21 NOTE — Telephone Encounter (Signed)
Virtual colonoscopy results reviewed with patient last evening. Patient needs office visit next.

## 2014-03-26 ENCOUNTER — Encounter (INDEPENDENT_AMBULATORY_CARE_PROVIDER_SITE_OTHER): Payer: Self-pay | Admitting: Internal Medicine

## 2014-03-26 ENCOUNTER — Ambulatory Visit (INDEPENDENT_AMBULATORY_CARE_PROVIDER_SITE_OTHER): Payer: Medicare Other | Admitting: Internal Medicine

## 2014-03-26 VITALS — BP 128/60 | HR 66 | Temp 98.0°F | Resp 18 | Ht 69.0 in | Wt 168.9 lb

## 2014-03-26 DIAGNOSIS — K589 Irritable bowel syndrome without diarrhea: Secondary | ICD-10-CM

## 2014-03-26 DIAGNOSIS — K5730 Diverticulosis of large intestine without perforation or abscess without bleeding: Secondary | ICD-10-CM

## 2014-03-26 NOTE — Progress Notes (Signed)
Patient has been scheduled for 03/26/14 at 2:30 pm with Dr. Karilyn Cota.

## 2014-03-26 NOTE — Progress Notes (Signed)
Presenting complaint;  Lower abdominal pain and bowel urgency. Here to discuss results of work for colonoscopy.  Database;  Patient is 80 year old Caucasian female who has history of diverticulitis and it was initially seen on 02/14/2014 for lower abdominal pain and urgency and sense of incomplete evacuation. She underwent abdominopelvic CT on 02/21/2014 which revealed extensive diverticular changes to sigmoid colon but no evidence of diverticulitis. She declined to undergo colonoscopy and therefore barium enema was scheduled but could not be completed. For some reason staff was not able to advance catheter into the rectum. She returns for office visit on 03/05/2014 and I did not identify any problems on digital exam. She was therefore scheduled to undergo virtual colonoscopy on 03/19/2014 without any difficulty.  She is not here for followup.  Subjective; Patient continues to complain of lower abdominal discomfort. She has sense of incomplete evacuation and has to make multiple trips to the bathroom. She generally passes bits and pieces of formed stool. She is afraid to go out to eat. She denies fever chills melena or rectal bleeding. She tells me that she should not have conscious sedation because of cardiac issues.   Current Medications: Outpatient Encounter Prescriptions as of 03/26/2014  Medication Sig  . digoxin (LANOXIN) 0.125 MG tablet Take 1 tablet (0.125 mg total) by mouth daily.  . isosorbide mononitrate (IMDUR) 30 MG 24 hr tablet Take 1 tablet (30 mg total) by mouth daily.  Marland Kitchen levothyroxine (SYNTHROID, LEVOTHROID) 100 MCG tablet Take 100 mcg by mouth daily.  . metoprolol (LOPRESSOR) 50 MG tablet Take 1 tablet (50 mg total) by mouth 2 (two) times daily.  . traMADol (ULTRAM) 50 MG tablet Take 25-50 mg by mouth every 6 (six) hours as needed for pain.   Marland Kitchen warfarin (COUMADIN) 2.5 MG tablet Take 1 tablet by mouth daily or as directed 1/2 of Friday  . simethicone (MYLICON) 125 MG chewable  tablet Chew 125 mg by mouth every 6 (six) hours as needed for flatulence.  . [DISCONTINUED] bag balm OINT ointment Apply 1 application topically daily as needed (pain).      Objective: Blood pressure 128/60, pulse 66, temperature 98 F (36.7 C), temperature source Oral, resp. rate 18, height 5\' 9"  (1.753 m), weight 168 lb 14.4 oz (76.613 kg). Patient is alert and in no acute distress. Conjunctiva is pink. Sclera is nonicteric Oropharyngeal mucosa is normal. No neck masses or thyromegaly noted. Abdomen is symmetrical. Bowel sounds are normal. No bruits noted. Abdomen is soft with mild tenderness in LLQ. No organomegaly or masses. No LE edema or clubbing noted.  Labs/studies Results: Virtual colonoscopy films reviewed with patient and her husband. Large cyst noted possibly originating from inferior aspect of right lobe of liver. Colonic diverticula are noted throughout the colon but mainly in distal sigmoid colon with wall thickening but no pericolonic inflammatory changes.    Assessment:  #1. Patient's symptoms are typical of IBS. She is not a candidate for anti-spasmodic therapy. Will try IBgard. #2. Sigmoid colon wall thickening possibly due to muscular hypertrophy. This area should be examined to rule out stricture. #3. Incidental finding of exophytic right hepatic cyst. No further workup needed. Review of prior imaging studies revealed it was initially seen on CT scan of February 2009. Plan:  High fiber diet. IBgard 1 capsule by mouth up to 3 times a day as needed. Samples given. If this medication works she can buy OTC. Consider flexible sigmoidoscopy with sedation at first she will check with Dr. Nicholaus Bloom.

## 2014-03-26 NOTE — Telephone Encounter (Signed)
[name] call patient and see if she is able to come this afternoon please.

## 2014-03-26 NOTE — Patient Instructions (Signed)
IBgard one capsule by mouth up to 3 times a day. Please check with Dr. Nicholaus Bloom re conscious sedation.

## 2014-03-26 NOTE — Telephone Encounter (Signed)
Spoke with [name], she has company and is fixing dinner. Not sure if she could may it. It would be better if there was a later apt. [facility] Dr. Karilyn Cota is only in the office today and will be at the hospital the rest of the week. We had a cancellation for 230 and patients will be seen every 15 min there after to please try to make it.

## 2014-04-17 ENCOUNTER — Ambulatory Visit (INDEPENDENT_AMBULATORY_CARE_PROVIDER_SITE_OTHER): Payer: Medicare Other | Admitting: *Deleted

## 2014-04-17 DIAGNOSIS — I482 Chronic atrial fibrillation, unspecified: Secondary | ICD-10-CM

## 2014-04-17 DIAGNOSIS — Z7901 Long term (current) use of anticoagulants: Secondary | ICD-10-CM

## 2014-04-17 DIAGNOSIS — I4891 Unspecified atrial fibrillation: Secondary | ICD-10-CM

## 2014-04-17 DIAGNOSIS — Z5181 Encounter for therapeutic drug level monitoring: Secondary | ICD-10-CM

## 2014-04-17 LAB — POCT INR: INR: 2.8

## 2014-05-01 ENCOUNTER — Encounter: Payer: Self-pay | Admitting: Cardiology

## 2014-05-02 ENCOUNTER — Telehealth: Payer: Self-pay | Admitting: Cardiovascular Disease

## 2014-05-02 ENCOUNTER — Other Ambulatory Visit: Payer: Self-pay

## 2014-05-02 NOTE — Telephone Encounter (Signed)
05-02-14 sent certified letter regarding past due device check/mt ° °

## 2014-05-03 ENCOUNTER — Telehealth: Payer: Self-pay | Admitting: Cardiovascular Disease

## 2014-05-03 NOTE — Telephone Encounter (Signed)
Losartan has been discontinued , and can be taken off her profile

## 2014-05-03 NOTE — Telephone Encounter (Signed)
[name] at [facility]. Is asking for authorization for Losartan 50 mg , also if she is longer suppose to take this please let her know so she can deactivate from profile . She will need a 90 day supply  Thanks

## 2014-05-07 NOTE — Telephone Encounter (Signed)
Received refill request for Losartan. Medication was discontinued.\ Pharmacy notified.

## 2014-05-10 ENCOUNTER — Ambulatory Visit (INDEPENDENT_AMBULATORY_CARE_PROVIDER_SITE_OTHER): Payer: Medicare Other | Admitting: Cardiovascular Disease

## 2014-05-10 ENCOUNTER — Encounter: Payer: Self-pay | Admitting: Cardiovascular Disease

## 2014-05-10 VITALS — BP 162/84 | HR 71 | Ht 69.0 in | Wt 168.7 lb

## 2014-05-10 DIAGNOSIS — I2510 Atherosclerotic heart disease of native coronary artery without angina pectoris: Secondary | ICD-10-CM

## 2014-05-10 DIAGNOSIS — I4891 Unspecified atrial fibrillation: Secondary | ICD-10-CM

## 2014-05-10 DIAGNOSIS — E039 Hypothyroidism, unspecified: Secondary | ICD-10-CM

## 2014-05-10 DIAGNOSIS — I482 Chronic atrial fibrillation, unspecified: Secondary | ICD-10-CM

## 2014-05-10 DIAGNOSIS — I429 Cardiomyopathy, unspecified: Secondary | ICD-10-CM

## 2014-05-10 DIAGNOSIS — Z7901 Long term (current) use of anticoagulants: Secondary | ICD-10-CM

## 2014-05-10 DIAGNOSIS — I428 Other cardiomyopathies: Secondary | ICD-10-CM

## 2014-05-10 DIAGNOSIS — I119 Hypertensive heart disease without heart failure: Secondary | ICD-10-CM

## 2014-05-10 MED ORDER — HYDROCHLOROTHIAZIDE 12.5 MG PO CAPS: 12.5000 mg | ORAL_CAPSULE | Freq: Every day | ORAL | Status: DC

## 2014-05-10 NOTE — Telephone Encounter (Signed)
05-10-14 CERTIFIED LETTER SIGNED BY DON Sindoni 05-04-14/MT

## 2014-05-10 NOTE — Patient Instructions (Signed)
MEDICATION  RESTART HCTZ 12.5 MG ONE TABLET DAILY  WILL GET LAB FROM DR FULPS  OFFICE --[place]  WILL DO  THE WEEK OF June 24 2014.Your physician has requested that you have an echocardiogram. Echocardiography is a painless test that uses sound waves to create images of your heart. It provides your doctor with information about the size and shape of your heart and how well your heart's chambers and valves are working. This procedure takes approximately one hour. There are no restrictions for this procedure.  Your physician wants you to follow-up in 2 MONTH DR KELLY.- RESULT ECHO  You will receive a reminder letter in the mail two months in advance. If you don't receive a letter, please call our office to schedule the follow-up appointment.

## 2014-05-12 ENCOUNTER — Encounter: Payer: Self-pay | Admitting: Cardiovascular Disease

## 2014-05-12 DIAGNOSIS — I119 Hypertensive heart disease without heart failure: Secondary | ICD-10-CM | POA: Insufficient documentation

## 2014-05-12 NOTE — Progress Notes (Signed)
Patient ID: [name], female   DOB: [birth year], 80 y.o.   MRN: [id number]     HPI: [name] is a 80 y.o. female who presents to the office today for a 18 month follow up cardiology evaluation.  [name] is an 80 year old female who is known coronary artery disease in 2009, underwent bare-metal stenting to her circumflex coronary artery.  She had mild 30-40% RCA stenosis and a normal LAD noted.  At that time.  She has a history of permanent atrial fibrillation, as well as hyperthyroidism.  She status post permanent pacemaker for sick sinus syndrome.  She has remote history of polio.  She has been followed by Dr. Alanda Amass in the past for pacemaker followup and apparently over the last year.  He has seen her with his last office visit being in September 2014.  She has also issues with lower GI tract abnormality and sees Dr. Anette Riedel.  Her last echo Doppler study was in November 2013, which showed an ejection fraction of 35-40%.  She had diastolic dysfunction with mild to moderate septal hypokinesis.  She is severely dilatation.  There was mild mitral annular calcification with mild-to-moderate mitral regurgitation.  She had mild TR.  There was evidence for aortic sclerosis without stenosis.  There was evidence for mild pulmonary hypertension.  Recently, as noted, some mild intermittent leg swelling.  Remotely she had been on HCTZ, but is unaware when this was stopped.  She also is a history of hypothyroidism.  She is on chronic Coumadin anticoagulation per  Recently, she has no shortness of breath.  She does have GI issues.  She does note some mild leg swelling.  She apparently has never been set up for telephonic pacemaker interrogation.  Past Medical History  Diagnosis Date  . Coronary artery disease   . A-fib   . Stroke   . Pacemaker   . Arthritis   . Diabetes mellitus     x 10 yrs at least  . Thyroid disease   . Anxiety   . Polio   . Hx of echocardiogram 09/2012   showed Ef 35-40% with no significant valve disease except for mild to moderate MR. normal RVSP and left atrium was severly dilated.  Marland Kitchen History of stress test 08/2011    negative for ischemia it was nongated and nondiagnostic and compatible with a possible scar or peri-infraction ischemia.     Past Surgical History  Procedure Laterality Date  . Cardiac surgery    . 3 cardiac stents      she has had non-DES stent to the mid circumflex in 2009 and LAD stent to the mid-distal LAD  BMS June 18, 2010  . Abdominal hysterectomy  1978  . Tonsillectomy    . Appendectomy    . Pacemaker placement  08/11/2010    Medtronic Adapta   . Coronary angioplasty      Allergies  Allergen Reactions  . Iohexol Other (See Comments)     Desc: CHEST PAIN, LOC AFTER HEART CATH, PT REFUSED DYE   . Penicillins Other (See Comments)    REACTION:  unknown  . Sulfur Other (See Comments)    REACTION:  unknown    Current Outpatient Prescriptions  Medication Sig Dispense Refill  . ALPRAZolam (XANAX) 0.25 MG tablet Take 0.25 mg by mouth 2 (two) times daily as needed for anxiety.      . digoxin (LANOXIN) 0.125 MG tablet Take 1 tablet (0.125 mg total) by mouth daily.  30 tablet  2  . isosorbide mononitrate (IMDUR) 30 MG 24 hr tablet Take 1 tablet (30 mg total) by mouth daily.  30 tablet  2  . levothyroxine (SYNTHROID, LEVOTHROID) 100 MCG tablet Take 100 mcg by mouth daily.      . metoprolol (LOPRESSOR) 50 MG tablet Take 1 tablet (50 mg total) by mouth 2 (two) times daily.  60 tablet  3  . traMADol (ULTRAM) 50 MG tablet Take 25-50 mg by mouth every 6 (six) hours as needed for pain.       Marland Kitchen. warfarin (COUMADIN) 2.5 MG tablet Take 1 tablet by mouth daily or as directed 1/2 of Friday      . hydrochlorothiazide (MICROZIDE) 12.5 MG capsule Take 1 capsule (12.5 mg total) by mouth daily.  90 capsule  3   No current facility-administered medications for this visit.    History   Social History  . Marital Status: Married      Spouse Name: N/A    Number of Children: N/A  . Years of Education: N/A   Occupational History  . Not on file.   Social History Main Topics  . Smoking status: Never Smoker   . Smokeless tobacco: Never Used  . Alcohol Use: No  . Drug Use: No  . Sexual Activity: Not on file   Other Topics Concern  . Not on file   Social History Narrative  . No narrative on file    Socially she is married, has 5 children, 5 grandchildren 2 great-grandchildren.  She does not exercise.  Family History  Problem Relation Age of Onset  . Heart disease Father   . Cancer Mother     ROS General: Negative; No fevers, chills, or night sweats HEENT: Negative; No changes in vision or hearing, sinus congestion, difficulty swallowing Pulmonary: Negative; No cough, wheezing, shortness of breath, hemoptysis Cardiovascular: See HPI: No chest pain, presyncope, syncope, palpatations GI: Positive bowel-like symptoms.  She was recently told to take IBGuard; No nausea, vomiting, diarrhea, or abdominal pain GU: Negative; No dysuria, hematuria, or difficulty voiding Musculoskeletal: Negative; no myalgias, joint pain, or weakness Hematologic: Negative; no easy bruising, bleeding Endocrine: Negative; no heat/cold intolerance; no diabetes, Neuro: Negative; no changes in balance, headaches Skin: Negative; No rashes or skin lesions Psychiatric: She does have mild anxiety.; No behavioral problems, depression Sleep: Negative; No snoring,  daytime sleepiness, hypersomnolence, bruxism, restless legs, hypnogognic hallucinations. Other comprehensive 14 point system review is negative   Physical Exam BP 162/84  Pulse 71  Ht 5\' 9"  (1.753 m)  Wt 168 lb 11.2 oz (76.522 kg)  BMI 24.90 kg/m2 Repeat blood pressure by me was 150/81 General: Alert, oriented, no distress.  Skin: normal turgor, no rashes, warm and dry HEENT: Normocephalic, atraumatic. Pupils equal round and reactive to light; sclera anicteric; extraocular  muscles intact, No lid lag; Nose without nasal septal hypertrophy; Mouth/Parynx benign; Mallinpatti scale 3 Neck: No JVD, no carotid bruits; normal carotid upstroke Lungs: clear to ausculatation and percussion bilaterally; no wheezing or rales, normal inspiratory and expiratory effort Chest wall: without tenderness to palpitation Heart: PMI not displaced, RRR, s1 s2 normal, 1-2/6 systolic murmur, No diastolic murmur, no rubs, gallops, thrills, or heaves Abdomen: soft, nontender; no hepatosplenomehaly, BS+; abdominal aorta nontender and not dilated by palpation. Back: no CVA tenderness Pulses: 2+  Musculoskeletal: full range of motion, normal strength, no joint deformities Extremities: Trivial edema; Pulses 2+, no clubbing cyanosis , Homan's sign negative  Neurologic: grossly nonfocal; Cranial nerves grossly  wnl Psychologic: Normal mood and affect   ECG (independently read by me): Atrial fibrillation at 71 beats per minute.  Isolated PVC.  Nonspecific ST changes.  LABS:  BMET    Component Value Date/Time   NA 138 09/30/2013 1255   K 4.2 09/30/2013 1255   CL 102 09/30/2013 1255   CO2 30 09/30/2013 1255   GLUCOSE 95 09/30/2013 1255   BUN 17 09/30/2013 1255   CREATININE 0.58 02/14/2014 1502   CREATININE 0.56 09/30/2013 1255   CALCIUM 9.1 09/30/2013 1255   GFRNONAA 85* 09/30/2013 1255   GFRAA >90 09/30/2013 1255     Hepatic Function Panel     Component Value Date/Time   PROT 6.3 09/30/2013 1255   ALBUMIN 3.5 09/30/2013 1255   AST 13 09/30/2013 1255   ALT 11 09/30/2013 1255   ALKPHOS 75 09/30/2013 1255   BILITOT 0.6 09/30/2013 1255   BILIDIR 0.2 11/08/2012 2053   IBILI 0.9 11/08/2012 2053     CBC    Component Value Date/Time   WBC 7.1 09/30/2013 1255   RBC 4.63 09/30/2013 1255   HGB 13.7 09/30/2013 1255   HCT 40.8 09/30/2013 1255   PLT 204 09/30/2013 1255   MCV 88.1 09/30/2013 1255   MCH 29.6 09/30/2013 1255   MCHC 33.6 09/30/2013 1255   RDW 14.1 09/30/2013 1255   LYMPHSABS 1.8 09/30/2013  1255   MONOABS 0.8 09/30/2013 1255   EOSABS 0.2 09/30/2013 1255   BASOSABS 0.0 09/30/2013 1255     BNP No results found for this basename: probnp    Lipid Panel     Component Value Date/Time   CHOL  Value: 191        ATP III CLASSIFICATION:  <200     mg/dL   Desirable  161-096  mg/dL   Borderline High  >=045    mg/dL   High        03/07/8118 0401   TRIG 109 06/16/2010 0401   HDL 53 06/16/2010 0401   CHOLHDL 3.6 06/16/2010 0401   VLDL 22 06/16/2010 0401   LDLCALC  Value: 116        Total Cholesterol/HDL:CHD Risk Coronary Heart Disease Risk Table                     Men   Women  1/2 Average Risk   3.4   3.3  Average Risk       5.0   4.4  2 X Average Risk   9.6   7.1  3 X Average Risk  23.4   11.0        Use the calculated Patient Ratio above and the CHD Risk Table to determine the patient's CHD Risk.        ATP III CLASSIFICATION (LDL):  <100     mg/dL   Optimal  147-829  mg/dL   Near or Above                    Optimal  130-159  mg/dL   Borderline  562-130  mg/dL   High  >865     mg/dL   Very High* 7/84/6962 0401     RADIOLOGY: No results found.    ASSESSMENT AND PLAN: Ms. Hebenstreit has a history of known CAD and is status post bare-metal stenting to her left circumflex coronary artery.  She had mild concomitant CAD involving the RCA.  She has a permanent pacemaker for sick sinus syndrome and has  permanent atrial fibrillation on chronic warfarin therapy.  She does have mild blood pressure elevation today.  I elected to add HCTZ 12.5 mg to her medical regimen, which also includes metoprolol 100 mg daily for blood pressure control.  She's not had any anginal symptoms and is tolerating isosorbide 30 mg.  She also takes Lanoxin 0.125 mg, both for rate control in light of her ejection fraction of 35-40%.  She states she recently had blood work done by Dr. Jillyn Hidden.  I will try to obtain the results of his laboratory.  I have suggested a follow up Bmet be obtained in 2 weeks following initiation of HCTZ.  I  am scheduling her for a followup echo Doppler study in light of her shortness of breath to reassess LV function and valvular architecture.  She will need to be set up for telephonic pacemaker interrogation.  She is uncertain if she can remember where her equipment is at home.  I will see her in office followup in 2 months.     Lennette Bihari, MD, [facility]  05/12/2014 5:25 PM

## 2014-05-15 ENCOUNTER — Ambulatory Visit (INDEPENDENT_AMBULATORY_CARE_PROVIDER_SITE_OTHER): Payer: Medicare Other | Admitting: *Deleted

## 2014-05-15 DIAGNOSIS — Z7901 Long term (current) use of anticoagulants: Secondary | ICD-10-CM

## 2014-05-15 DIAGNOSIS — Z5181 Encounter for therapeutic drug level monitoring: Secondary | ICD-10-CM

## 2014-05-15 DIAGNOSIS — I482 Chronic atrial fibrillation, unspecified: Secondary | ICD-10-CM

## 2014-05-15 DIAGNOSIS — I4891 Unspecified atrial fibrillation: Secondary | ICD-10-CM

## 2014-05-15 LAB — POCT INR: INR: 4.1

## 2014-05-16 ENCOUNTER — Telehealth: Payer: Self-pay | Admitting: Cardiovascular Disease

## 2014-05-16 NOTE — Telephone Encounter (Signed)
[name] states that he received a call from this number this morning.  I have checked with triage, the [place] [facility] ([facility]) and Sallye Ober is no phone message started for this patient.

## 2014-05-22 ENCOUNTER — Encounter (INDEPENDENT_AMBULATORY_CARE_PROVIDER_SITE_OTHER): Payer: Self-pay

## 2014-05-29 ENCOUNTER — Other Ambulatory Visit: Payer: Self-pay | Admitting: *Deleted

## 2014-05-29 ENCOUNTER — Ambulatory Visit (INDEPENDENT_AMBULATORY_CARE_PROVIDER_SITE_OTHER): Payer: Medicare Other | Admitting: *Deleted

## 2014-05-29 DIAGNOSIS — Z7901 Long term (current) use of anticoagulants: Secondary | ICD-10-CM

## 2014-05-29 DIAGNOSIS — I4891 Unspecified atrial fibrillation: Secondary | ICD-10-CM

## 2014-05-29 DIAGNOSIS — Z5181 Encounter for therapeutic drug level monitoring: Secondary | ICD-10-CM

## 2014-05-29 DIAGNOSIS — I482 Chronic atrial fibrillation, unspecified: Secondary | ICD-10-CM

## 2014-05-29 LAB — POCT INR: INR: 3.3

## 2014-05-29 MED ORDER — DIGOXIN 125 MCG PO TABS: 0.1250 mg | ORAL_TABLET | Freq: Every day | ORAL | Status: DC

## 2014-05-29 MED ORDER — ISOSORBIDE MONONITRATE ER 30 MG PO TB24: 30.0000 mg | ORAL_TABLET | Freq: Every day | ORAL | Status: DC

## 2014-06-25 ENCOUNTER — Ambulatory Visit (HOSPITAL_COMMUNITY)
Admission: RE | Admit: 2014-06-25 | Discharge: 2014-06-25 | Disposition: A | Discharge status: Home / Self Care | Payer: Medicare Other | Source: Ambulatory Visit | Attending: Cardiovascular Disease | Admitting: Cardiovascular Disease

## 2014-06-25 DIAGNOSIS — I2510 Atherosclerotic heart disease of native coronary artery without angina pectoris: Secondary | ICD-10-CM | POA: Diagnosis not present

## 2014-06-25 DIAGNOSIS — I482 Chronic atrial fibrillation, unspecified: Secondary | ICD-10-CM

## 2014-06-25 DIAGNOSIS — I4891 Unspecified atrial fibrillation: Secondary | ICD-10-CM | POA: Diagnosis not present

## 2014-06-25 DIAGNOSIS — I429 Cardiomyopathy, unspecified: Secondary | ICD-10-CM

## 2014-06-25 DIAGNOSIS — I428 Other cardiomyopathies: Secondary | ICD-10-CM | POA: Diagnosis not present

## 2014-06-25 DIAGNOSIS — I369 Nonrheumatic tricuspid valve disorder, unspecified: Secondary | ICD-10-CM

## 2014-06-25 NOTE — Progress Notes (Signed)
2D Echocardiogram Complete.  06/25/2014   Adriaan Maltese, RDCS  

## 2014-06-26 ENCOUNTER — Telehealth: Payer: Self-pay | Admitting: Cardiovascular Disease

## 2014-06-26 ENCOUNTER — Ambulatory Visit (INDEPENDENT_AMBULATORY_CARE_PROVIDER_SITE_OTHER): Payer: Medicare Other | Admitting: *Deleted

## 2014-06-26 DIAGNOSIS — I482 Chronic atrial fibrillation, unspecified: Secondary | ICD-10-CM

## 2014-06-26 DIAGNOSIS — Z5181 Encounter for therapeutic drug level monitoring: Secondary | ICD-10-CM

## 2014-06-26 DIAGNOSIS — I4891 Unspecified atrial fibrillation: Secondary | ICD-10-CM

## 2014-06-26 DIAGNOSIS — Z7901 Long term (current) use of anticoagulants: Secondary | ICD-10-CM

## 2014-06-26 LAB — POCT INR: INR: 2.5

## 2014-06-26 NOTE — Telephone Encounter (Signed)
Mr. [name] is calling to find out the results of his wife echo that she had on yesterday.. Please Call    Thanks

## 2014-06-26 NOTE — Telephone Encounter (Signed)
Spoke with pt husband, aware dr Tresa Endo has not reviewed echo and will not be back in the office until next week. Informed we will call as soon as dr Tresa Endo has looked over the test.

## 2014-07-01 ENCOUNTER — Telehealth: Payer: Self-pay | Admitting: Cardiovascular Disease

## 2014-07-01 ENCOUNTER — Encounter (INDEPENDENT_AMBULATORY_CARE_PROVIDER_SITE_OTHER): Payer: Self-pay | Admitting: Internal Medicine

## 2014-07-01 ENCOUNTER — Ambulatory Visit (INDEPENDENT_AMBULATORY_CARE_PROVIDER_SITE_OTHER): Payer: Medicare Other | Admitting: Internal Medicine

## 2014-07-01 VITALS — BP 108/68 | HR 66 | Temp 96.8°F | Resp 16 | Ht 69.0 in | Wt 165.9 lb

## 2014-07-01 DIAGNOSIS — K589 Irritable bowel syndrome without diarrhea: Secondary | ICD-10-CM

## 2014-07-01 MED ORDER — DICYCLOMINE HCL 10 MG PO CAPS: 10.0000 mg | ORAL_CAPSULE | Freq: Every day | ORAL | Status: DC

## 2014-07-01 NOTE — Patient Instructions (Signed)
Keep diary as to stool frequency and consistency until next office visit. Keep  record of the times he have abdominal pain and urgency.  Plan take dicyclomine by mouth before breakfast or after first bowel movement

## 2014-07-01 NOTE — Progress Notes (Signed)
Presenting complaint;  Followup for left-sided abdominal pain and increased frequency of defecation.  Subjective:  Patient is 80 year old Caucasian female who is in for scheduled visit accompanied by her husband. She was last seen on 03/26/2014. Earlier this year she was treated for diverticulitis confirmed with CT in March 2015. She has been reluctant to undergo colonoscopy. She therefore underwent virtual colonoscopy on 03/19/2014 revealing multiple diverticula throughout the colon but mainly in sigmoid colon and no evidence of diverticulitis or wall thickening. She also had exophytic hepatic cyst which is chronic. Last visit she was begun on IBgard which she is only taking twice a day because of the cost. She states it helps her with bowel movements. She is still having problems. She is still having an episode of severe pain unless her abdomen followed by urgency and diarrhea. On most days she has 5-6 stools. Most of her stools are small. Every now and then she may have 2 stools on a given day. She denies rectal bleeding melena fever or chills. She is using Pepto-Bismol fairly frequently but not every day. She says she eats balanced diet. She eats salads regularly and oatmeal every morning. She has lost 3 pounds since her last visit.    Current Medications: Outpatient Encounter Prescriptions as of 07/01/2014  Medication Sig  . ALPRAZolam (XANAX) 0.25 MG tablet Take 0.25 mg by mouth 2 (two) times daily as needed for anxiety.  . digoxin (LANOXIN) 0.125 MG tablet Take 1 tablet (0.125 mg total) by mouth daily.  . hydrochlorothiazide (MICROZIDE) 12.5 MG capsule Take 1 capsule (12.5 mg total) by mouth daily.  . isosorbide mononitrate (IMDUR) 30 MG 24 hr tablet Take 1 tablet (30 mg total) by mouth daily.  Marland Kitchen levothyroxine (SYNTHROID, LEVOTHROID) 100 MCG tablet Take 100 mcg by mouth daily.  . metoprolol (LOPRESSOR) 50 MG tablet Take 1 tablet (50 mg total) by mouth 2 (two) times daily.  . traMADol  (ULTRAM) 50 MG tablet Take 25-50 mg by mouth every 6 (six) hours as needed for pain.   Marland Kitchen warfarin (COUMADIN) 2.5 MG tablet Take 1/2 tablet Monday, Wednesday, Friday. All other days she takes a whole tablet.     Objective: Blood pressure 108/68, pulse 66, temperature 96.8 F (36 C), temperature source Oral, resp. rate 16, height 5\' 9"  (1.753 m), weight 165 lb 14.4 oz (75.252 kg). Patient is alert and in no acute distress. Conjunctiva is pink. Sclera is nonicteric Oropharyngeal mucosa is normal. No neck masses or thyromegaly noted. Cardiac exam with regular rhythm normal S1 and S2. No murmur or gallop noted. Lungs are clear to auscultation. Abdomen is symmetrical. Bowel sounds are normal. On palpation abdomen is soft with mild tenderness at LLQ on deep palpation but no guarding or rebound. No organomegaly or masses.  No LE edema or clubbing noted.    Assessment:  #1. Irritable bowel syndrome. Patient's symptoms are typical. However she has not responded to therapy. Will try her on low dose dicyclomine if she is able to tolerate it. If symptoms persist will consider flexible sigmoidoscopy if cleared by cardiology.  Plan:  Dicyclomine 10 mg by mouth every morning. Symptom diary as to frequency and consistency of stools and also as to occurrence of episodic abdominal pain. Office visit in 2 months.

## 2014-07-01 NOTE — Telephone Encounter (Signed)
Please call today,pt have been waiting to get her echo results.

## 2014-07-02 ENCOUNTER — Encounter: Payer: Self-pay | Admitting: Podiatrist

## 2014-07-03 NOTE — Telephone Encounter (Signed)
Echo results called to patient.  Voiced understanding. 

## 2014-07-03 NOTE — Telephone Encounter (Signed)
Patient very upset because they have not received a call regarding Mrs. Bilski's Echo results.

## 2014-07-04 ENCOUNTER — Telehealth (INDEPENDENT_AMBULATORY_CARE_PROVIDER_SITE_OTHER): Payer: Self-pay | Admitting: *Deleted

## 2014-07-04 NOTE — Telephone Encounter (Signed)
I called and a PA was completed for the patient for  Dicyclomine 10 mg capsule - Take 1 capsule by mouth daily #60 5 refills. Per [name] @ [facility] this had already been approved and until the end of the year. She just had to enter 10 mg.  [facility]  [facility]. Pharmacist made aware and he ran it with success, [name] called and made aware.

## 2014-07-11 ENCOUNTER — Other Ambulatory Visit: Payer: Self-pay | Admitting: *Deleted

## 2014-07-11 MED ORDER — METOPROLOL TARTRATE 50 MG PO TABS: 50.0000 mg | ORAL_TABLET | Freq: Two times a day (BID) | ORAL | Status: DC

## 2014-07-19 ENCOUNTER — Telehealth: Payer: Self-pay | Admitting: Cardiovascular Disease

## 2014-07-19 NOTE — Telephone Encounter (Signed)
Calling because Dr.Kelly has added something to  Her metoprolol and the Pharmacist will not give her the medication until it is verified by our office.[facility] [place] .Marland Kitchen Please call to verify the change .Marland Kitchen Thanks

## 2014-07-19 NOTE — Telephone Encounter (Signed)
Returned call to patient she stated she just received a call from [facility]ist [facility] [place].She stated she was confused what he was telling her.Spoke to [facility]ist at [facility] he stated prescription for Toprolol XL was changed to Metoprolol Tartrate short acting and he wanted to let her know.Stated he will put on hold and she can discuss at 07/24/14 office visit with Dr.Kelly.

## 2014-07-24 ENCOUNTER — Encounter: Payer: Self-pay | Admitting: Cardiovascular Disease

## 2014-07-24 ENCOUNTER — Ambulatory Visit (INDEPENDENT_AMBULATORY_CARE_PROVIDER_SITE_OTHER): Payer: Medicare Other | Admitting: Cardiovascular Disease

## 2014-07-24 VITALS — BP 110/60 | HR 64 | Ht 69.0 in | Wt 164.0 lb

## 2014-07-24 DIAGNOSIS — E782 Mixed hyperlipidemia: Secondary | ICD-10-CM

## 2014-07-24 DIAGNOSIS — I4891 Unspecified atrial fibrillation: Secondary | ICD-10-CM

## 2014-07-24 DIAGNOSIS — I495 Sick sinus syndrome: Secondary | ICD-10-CM

## 2014-07-24 DIAGNOSIS — I482 Chronic atrial fibrillation, unspecified: Secondary | ICD-10-CM

## 2014-07-24 DIAGNOSIS — Z7901 Long term (current) use of anticoagulants: Secondary | ICD-10-CM

## 2014-07-24 DIAGNOSIS — E039 Hypothyroidism, unspecified: Secondary | ICD-10-CM

## 2014-07-24 DIAGNOSIS — I2510 Atherosclerotic heart disease of native coronary artery without angina pectoris: Secondary | ICD-10-CM

## 2014-07-24 MED ORDER — EZETIMIBE 10 MG PO TABS: 10.0000 mg | ORAL_TABLET | Freq: Every day | ORAL | Status: DC

## 2014-07-24 NOTE — Progress Notes (Signed)
Patient ID: [name], female   DOB: 10/08/1930, 80 y.o.   MRN: [id number]     HPI: [name] is a 80 y.o. female who presents to the office today for  follow up cardiology evaluation.  Ms. Axt is an 80 year old female with known coronary artery disease and underwent bare-metal stenting to her circumflex coronary artery in 2009.  She had mild 30-40% RCA stenosis and a normal LAD noted.  At that time.  She has a history of permanent atrial fibrillation, as well as hyperthyroidism.  She status post permanent pacemaker for sick sinus syndrome.  She has remote history of polio.  She had been followed by Dr. Alanda Amass in the past for pacemaker followup and apparently over the last year.  He has seen her with his last office visit being in September 2014.  She has also issues with lower GI tract abnormality and sees Dr. Karilyn Cota in [place].   An echo Doppler study in November 2013 showed an ejection fraction of 35-40%.  She had diastolic dysfunction with mild to moderate septal hypokinesis.  She is severely dilatation.  There was mild mitral annular calcification with mild-to-moderate mitral regurgitation.  She had mild TR.  There was evidence for aortic sclerosis without stenosis.  There was evidence for mild pulmonary hypertension.  Recently, as noted, some mild intermittent leg swelling.  Remotely she had been on HCTZ, but is unaware when this was stopped.  She also is a history of hypothyroidism.  She is on chronic Coumadin anticoagulation per  Recently, she has no shortness of breath.  She does have GI issues.  She does note some mild leg swelling.  She apparently has never been set up for telephonic pacemaker interrogation.  I saw her in June at that time had not seen her in over a year and half.  She does have irritable bowel syndrome.  She tells me that she has been told by Dr. Natasha Mead, that she has colonic spasms and possibly in October.  He may want to consider doing a colonoscopy.  She has  permanent atrial fibrillation and is on chronic Coumadin therapy.  She also has a history of hyperlipidemia.  In the past.  She has not been very tolerant to statin therapy.  She also has a history of hypothyroidism.  She denies any episodes of increasing heart rate.  She recently underwent a followup echo Doppler study on 06/25/2014.  Ejection fraction was now 60-65%.  There was severe biatrial enlargement.  There was evidence for mitral annular calcification with trivial MR.  Chest mild TR.  Pacemaker wire was present in the right heart.  Past Medical History  Diagnosis Date  . Coronary artery disease   . A-fib   . Stroke   . Pacemaker   . Arthritis   . Diabetes mellitus     x 10 yrs at least  . Thyroid disease   . Anxiety   . Polio   . Hx of echocardiogram 09/2012    showed Ef 35-40% with no significant valve disease except for mild to moderate MR. normal RVSP and left atrium was severly dilated.  Marland Kitchen History of stress test 08/2011    negative for ischemia it was nongated and nondiagnostic and compatible with a possible scar or peri-infraction ischemia.     Past Surgical History  Procedure Laterality Date  . Cardiac surgery    . 3 cardiac stents      she has had non-DES stent to the mid  circumflex in 2009 and LAD stent to the mid-distal LAD  BMS June 18, 2010  . Abdominal hysterectomy  1978  . Tonsillectomy    . Appendectomy    . Pacemaker placement  08/11/2010    Medtronic Adapta   . Coronary angioplasty      Allergies  Allergen Reactions  . Iohexol Other (See Comments)     Desc: CHEST PAIN, LOC AFTER HEART CATH, PT REFUSED DYE   . Penicillins Other (See Comments)    REACTION:  unknown  . Sulfur Other (See Comments)    REACTION:  unknown    Current Outpatient Prescriptions  Medication Sig Dispense Refill  . ALPRAZolam (XANAX) 0.25 MG tablet Take 0.25 mg by mouth 2 (two) times daily as needed for anxiety.      . dicyclomine (BENTYL) 10 MG capsule Take 1 capsule (10  mg total) by mouth daily.  60 capsule  5  . digoxin (LANOXIN) 0.125 MG tablet Take 1 tablet (0.125 mg total) by mouth daily.  30 tablet  6  . hydrochlorothiazide (MICROZIDE) 12.5 MG capsule Take 1 capsule (12.5 mg total) by mouth daily.  90 capsule  3  . isosorbide mononitrate (IMDUR) 30 MG 24 hr tablet Take 1 tablet (30 mg total) by mouth daily.  30 tablet  6  . levothyroxine (SYNTHROID, LEVOTHROID) 100 MCG tablet Take 100 mcg by mouth daily.      . metoprolol (LOPRESSOR) 50 MG tablet Take 1 tablet (50 mg total) by mouth 2 (two) times daily.  60 tablet  3  . traMADol (ULTRAM) 50 MG tablet Take 25-50 mg by mouth every 6 (six) hours as needed for pain.       Marland Kitchen warfarin (COUMADIN) 2.5 MG tablet Take 1/2 tablet Monday, Wednesday, Friday. All other days she takes a whole tablet.      . ezetimibe (ZETIA) 10 MG tablet Take 1 tablet (10 mg total) by mouth daily.  30 tablet  6   No current facility-administered medications for this visit.    History   Social History  . Marital Status: Married    Spouse Name: N/A    Number of Children: N/A  . Years of Education: N/A   Occupational History  . Not on file.   Social History Main Topics  . Smoking status: Never Smoker   . Smokeless tobacco: Never Used  . Alcohol Use: No  . Drug Use: No  . Sexual Activity: Not on file   Other Topics Concern  . Not on file   Social History Narrative  . No narrative on file    Socially she is married, has 5 children, 5 grandchildren 2 great-grandchildren.  She does not exercise.  Family History  Problem Relation Age of Onset  . Heart disease Father   . Cancer Mother     ROS General: Negative; No fevers, chills, or night sweats HEENT: Negative; No changes in vision or hearing, sinus congestion, difficulty swallowing Pulmonary: Negative; No cough, wheezing, shortness of breath, hemoptysis Cardiovascular: See HPI: No chest pain, presyncope, syncope, palpatations GI: Positive IBS-like symptoms.  She was  recently told to take IBGuard; No nausea, vomiting, diarrhea.  Positive for GERD, as well as colonic diverticulitis remotely. GU: Negative; No dysuria, hematuria, or difficulty voiding Musculoskeletal: Negative; no myalgias, joint pain, or weakness Hematologic: Negative; no easy bruising, bleeding Endocrine: Positive for hypothyroidism no diabetes, Neuro: Negative; no changes in balance, headaches Skin: Negative; No rashes or skin lesions Psychiatric: She does have mild anxiety.;  No behavioral problems, depression Sleep: Negative; No snoring,  daytime sleepiness, hypersomnolence, bruxism, restless legs, hypnogognic hallucinations. Other comprehensive 14 point system review is negative   Physical Exam BP 110/60  Pulse 64  Ht  (1.753 m)  Wt 164 lb (74.39 kg)  BMI 24.21 kg/m2 Repeat blood pressure by me was 150/81 General: Alert, oriented, no distress.  Skin: normal turgor, no rashes, warm and dry HEENT: Normocephalic, atraumatic. Pupils equal round and reactive to light; sclera anicteric; extraocular muscles intact, No lid lag; Nose without nasal septal hypertrophy; Mouth/Parynx benign; Mallinpatti scale 3 Neck: No JVD, no carotid bruits; normal carotid upstroke Lungs: clear to ausculatation and percussion bilaterally; no wheezing or rales, normal inspiratory and expiratory effort Chest wall: without tenderness to palpitation Heart: PMI not displaced, RRR, s1 s2 normal, 1-2/6 systolic murmur, No diastolic murmur, no rubs, gallops, thrills, or heaves Abdomen: soft, nontender; no hepatosplenomehaly, BS+; abdominal aorta nontender and not dilated by palpation. Back: no CVA tenderness Pulses: 2+  Musculoskeletal: full range of motion, normal strength, no joint deformities Extremities: Trivial edema; Pulses 2+, no clubbing cyanosis , Homan's sign negative  Neurologic: grossly nonfocal; Cranial nerves grossly wnl Psychologic: Normal mood and affect  ECG (independently read by the):  Atrial fibrillation at 64 beats per minute.  QTc interval 466 ms.  Paced rhythm   05/10/2014 ECG (independently read by me): Atrial fibrillation at 71 beats per minute.  Isolated PVC.  Nonspecific ST changes.  LABS:  BMET    Component Value Date/Time   NA 138 09/30/2013 1255   K 4.2 09/30/2013 1255   CL 102 09/30/2013 1255   CO2 30 09/30/2013 1255   GLUCOSE 95 09/30/2013 1255   BUN 17 09/30/2013 1255   CREATININE 0.58 02/14/2014 1502   CREATININE 0.56 09/30/2013 1255   CALCIUM 9.1 09/30/2013 1255   GFRNONAA 85* 09/30/2013 1255   GFRAA >90 09/30/2013 1255     Hepatic Function Panel     Component Value Date/Time   PROT 6.3 09/30/2013 1255   ALBUMIN 3.5 09/30/2013 1255   AST 13 09/30/2013 1255   ALT 11 09/30/2013 1255   ALKPHOS 75 09/30/2013 1255   BILITOT 0.6 09/30/2013 1255   BILIDIR 0.2 11/08/2012 2053   IBILI 0.9 11/08/2012 2053     CBC    Component Value Date/Time   WBC 7.1 09/30/2013 1255   RBC 4.63 09/30/2013 1255   HGB 13.7 09/30/2013 1255   HCT 40.8 09/30/2013 1255   PLT 204 09/30/2013 1255   MCV 88.1 09/30/2013 1255   MCH 29.6 09/30/2013 1255   MCHC 33.6 09/30/2013 1255   RDW 14.1 09/30/2013 1255   LYMPHSABS 1.8 09/30/2013 1255   MONOABS 0.8 09/30/2013 1255   EOSABS 0.2 09/30/2013 1255   BASOSABS 0.0 09/30/2013 1255     BNP No results found for this basename: probnp    Lipid Panel     Component Value Date/Time   CHOL  Value: 191        ATP III CLASSIFICATION:  <200     mg/dL   Desirable  161-096  mg/dL   Borderline High  >=045    mg/dL   High        03/07/8118 0401   TRIG 109 06/16/2010 0401   HDL 53 06/16/2010 0401   CHOLHDL 3.6 06/16/2010 0401   VLDL 22 06/16/2010 0401   LDLCALC  Value: 116        Total Cholesterol/HDL:CHD Risk Coronary Heart Disease Risk Table  Men   Women  1/2 Average Risk   3.4   3.3  Average Risk       5.0   4.4  2 X Average Risk   9.6   7.1  3 X Average Risk  23.4   11.0        Use the calculated Patient Ratio above and the CHD Risk  Table to determine the patient's CHD Risk.        ATP III CLASSIFICATION (LDL):  <100     mg/dL   Optimal  161-096  mg/dL   Near or Above                    Optimal  130-159  mg/dL   Borderline  045-409  mg/dL   High  >811     mg/dL   Very High* 08/12/7828 0401     RADIOLOGY: No results found.    ASSESSMENT AND PLAN: Ms. Baskerville has a history of known CAD and is status post bare-metal stenting to her left circumflex coronary artery.  She had mild concomitant CAD involving the RCA.  She has a permanent pacemaker for sick sinus syndrome and has permanent atrial fibrillation on chronic warfarin therapy.  When I last saw her, she was hypertensive, and I did adjust her medications and added hydrochlorothiazide 12.5 mg to her metoprolol 50 twice a day.  Her atrial fibrillation rate is controlled.  She is on chronic Coumadin therapy.  I discussed with her that if colonoscopy is performed that she will most likely need to hold her Coumadin for at least 3 days.  However, with her permanent, AF I'm hesitant to keep her off anticoagulation much longer.  If she is stable following her colonoscopy it may be worthwhile to consider a NOAC , such as Eliquis , following her procedure once she is stable from a GI standpoint, which will allow for therapeutic anticoagulation approximately 2 hours later.  Alternatively, she may require Lovenox/Coumadin bridge to reinitiate therapeutic anticoagulation.  She will be seeing Dr. Dionicia Abler in October.  I also recommended a trial of Zetia 10 mg in light of her laboratory earlier this year, which showed a total cholesterol of 224, LDL 134 in this patient with established CAD, who apparently did not tolerate statin therapy previously.  I will see her in 4 months for followup evaluation.   Lennette Bihari, MD, [facility]  07/24/2014 6:21 PM

## 2014-07-24 NOTE — Patient Instructions (Signed)
Your physician has recommended you make the following change in your medication:restart the ZETIA 10 MG.  Your physician recommends that you return for lab work in: 3 MONTHS -FASTING.  Your physician wants you to follow-up in:4 MONTHS. You will receive a reminder letter in the mail two months in advance. If you don't receive a letter, please call our office to schedule the follow-up appointment.

## 2014-07-25 ENCOUNTER — Ambulatory Visit (INDEPENDENT_AMBULATORY_CARE_PROVIDER_SITE_OTHER): Payer: Medicare Other | Admitting: *Deleted

## 2014-07-25 DIAGNOSIS — I482 Chronic atrial fibrillation, unspecified: Secondary | ICD-10-CM

## 2014-07-25 DIAGNOSIS — Z7901 Long term (current) use of anticoagulants: Secondary | ICD-10-CM

## 2014-07-25 DIAGNOSIS — I4891 Unspecified atrial fibrillation: Secondary | ICD-10-CM

## 2014-07-25 DIAGNOSIS — Z5181 Encounter for therapeutic drug level monitoring: Secondary | ICD-10-CM

## 2014-07-25 LAB — POCT INR: INR: 2.4

## 2014-08-22 ENCOUNTER — Ambulatory Visit (INDEPENDENT_AMBULATORY_CARE_PROVIDER_SITE_OTHER): Payer: Medicare Other | Admitting: *Deleted

## 2014-08-22 DIAGNOSIS — I4891 Unspecified atrial fibrillation: Secondary | ICD-10-CM

## 2014-08-22 DIAGNOSIS — I482 Chronic atrial fibrillation, unspecified: Secondary | ICD-10-CM

## 2014-08-22 DIAGNOSIS — Z7901 Long term (current) use of anticoagulants: Secondary | ICD-10-CM

## 2014-08-22 DIAGNOSIS — Z5181 Encounter for therapeutic drug level monitoring: Secondary | ICD-10-CM

## 2014-08-22 LAB — POCT INR: INR: 2.4

## 2014-09-09 ENCOUNTER — Encounter (INDEPENDENT_AMBULATORY_CARE_PROVIDER_SITE_OTHER): Payer: Self-pay | Admitting: Internal Medicine

## 2014-09-09 ENCOUNTER — Ambulatory Visit (INDEPENDENT_AMBULATORY_CARE_PROVIDER_SITE_OTHER): Payer: Medicare Other | Admitting: Internal Medicine

## 2014-09-09 VITALS — BP 108/72 | HR 68 | Temp 97.0°F | Resp 18 | Ht 69.0 in | Wt 163.3 lb

## 2014-09-09 DIAGNOSIS — K589 Irritable bowel syndrome without diarrhea: Secondary | ICD-10-CM

## 2014-09-09 NOTE — Progress Notes (Signed)
Presenting complaint;  Followup for lower abdominal pain and irregular bowel.  Database;  Patient is 80 year old Caucasian female who presents for scheduled visit. She states she has had lifelong bowel problems. Back in December 2013, she was treated for sigmoid diverticulitis. She was seen in March see her for lower abdominal pain and underwent abdominal pelvic CT which revealed extensive sigmoid diverticulosis without evidence of diverticulitis. Patient declined to undergo sigmoidoscopy or colonoscopy. The ileum enema was attempted but she could not tolerate procedure. Finally she had virtual colonoscopy on 03/19/2014. She was asked to continue dicyclomine 10 mg every morning.  Subjective;  She continues to feel better. She has not had any accidents since her last visit. She has had very few episodes of hypogastric pain and urgency. When she has pain in his relief with bowel movement. On most days she has 1-2 formed stools daily. Stool caliber is small but has not changed. Occasionally she 3 stools in a day. She denies melena or rectal bleeding fever or chills. Overall she feels much better. She does not want to undergo flexible sigmoidoscopy because she has been told by her cardiologist that it would be risky for her to come off anticoagulant.   Current Medications: Outpatient Encounter Prescriptions as of 09/09/2014  Medication Sig  . ALPRAZolam (XANAX) 0.25 MG tablet Take 0.25 mg by mouth 2 (two) times daily as needed for anxiety.  . dicyclomine (BENTYL) 10 MG capsule Take 1 capsule (10 mg total) by mouth daily.  . digoxin (LANOXIN) 0.125 MG tablet Take 1 tablet (0.125 mg total) by mouth daily.  Marland Kitchen. ezetimibe (ZETIA) 10 MG tablet Take 1 tablet (10 mg total) by mouth daily.  . hydrochlorothiazide (MICROZIDE) 12.5 MG capsule Take 1 capsule (12.5 mg total) by mouth daily.  . isosorbide mononitrate (IMDUR) 30 MG 24 hr tablet Take 1 tablet (30 mg total) by mouth daily.  Marland Kitchen. levothyroxine  (SYNTHROID, LEVOTHROID) 100 MCG tablet Take 100 mcg by mouth daily.  . metoprolol (LOPRESSOR) 50 MG tablet Take 1 tablet (50 mg total) by mouth 2 (two) times daily.  Marland Kitchen. OVER THE COUNTER MEDICATION IBgard Take 1 by mouth daily.  . traMADol (ULTRAM) 50 MG tablet Take 25-50 mg by mouth every 6 (six) hours as needed for pain.   Marland Kitchen. warfarin (COUMADIN) 2.5 MG tablet Take 1/2 tablet Monday, Wednesday, Friday. All other days she takes a whole tablet.     Objective: Blood pressure 108/72, pulse 68, temperature 97 F (36.1 C), temperature source Oral, resp. rate 18, height 5\' 9"  (1.753 m), weight 163 lb 4.8 oz (74.072 kg). Patient is alert and in no acute distress. Conjunctiva is pink. Sclera is nonicteric Oropharyngeal mucosa is normal. No neck masses or thyromegaly noted. Abdomen is flat. Bowel sounds are normal and no bruit noted. Abdomen is soft and nontender without organomegaly or masses.  No LE edema or clubbing noted.  Labs/studies Results:  Virtual colonoscopy images are reviewed with patient and her husband.   Assessment:  #1. Patient's symptoms are typical of a typical bowel syndrome. Distal sigmoid colon did not distend well on virtual colonoscopy and this may be a function of prior episodes of diverticulitis but based on her symptoms I do not bleed she has critical stricture. If symptoms progress diagnostic flexible sigmoidoscopy would be considered for which she would not have to stop anticoagulation. She is not having any side effects with low-dose dicyclomine. Since she is doing better will continue to monitor symptoms.   Plan:  Patient will  keep symptom diary and return for office visit in 4 months.

## 2014-09-09 NOTE — Patient Instructions (Signed)
Please keep record as to frequency of lower abdominal pain and if it is alleviated with bowel movement.

## 2014-10-02 ENCOUNTER — Ambulatory Visit (INDEPENDENT_AMBULATORY_CARE_PROVIDER_SITE_OTHER): Payer: Medicare Other | Admitting: *Deleted

## 2014-10-02 DIAGNOSIS — Z7901 Long term (current) use of anticoagulants: Secondary | ICD-10-CM

## 2014-10-02 DIAGNOSIS — I4891 Unspecified atrial fibrillation: Secondary | ICD-10-CM

## 2014-10-02 DIAGNOSIS — I482 Chronic atrial fibrillation, unspecified: Secondary | ICD-10-CM

## 2014-10-02 DIAGNOSIS — Z5181 Encounter for therapeutic drug level monitoring: Secondary | ICD-10-CM

## 2014-10-02 LAB — POCT INR: INR: 2.6

## 2014-11-03 ENCOUNTER — Other Ambulatory Visit: Payer: Self-pay | Admitting: Pharmacist Clinician (PhC)/ Clinical Pharmacy Specialist

## 2014-11-07 ENCOUNTER — Encounter (HOSPITAL_COMMUNITY): Payer: Self-pay | Admitting: Cardiovascular Disease

## 2014-11-13 ENCOUNTER — Encounter: Payer: Self-pay | Admitting: *Deleted

## 2014-11-18 ENCOUNTER — Ambulatory Visit (INDEPENDENT_AMBULATORY_CARE_PROVIDER_SITE_OTHER): Payer: Medicare Other | Admitting: *Deleted

## 2014-11-18 DIAGNOSIS — Z7901 Long term (current) use of anticoagulants: Secondary | ICD-10-CM

## 2014-11-18 DIAGNOSIS — I482 Chronic atrial fibrillation, unspecified: Secondary | ICD-10-CM

## 2014-11-18 DIAGNOSIS — I4891 Unspecified atrial fibrillation: Secondary | ICD-10-CM

## 2014-11-18 DIAGNOSIS — Z5181 Encounter for therapeutic drug level monitoring: Secondary | ICD-10-CM

## 2014-11-18 LAB — POCT INR: INR: 2.4

## 2014-11-25 ENCOUNTER — Ambulatory Visit (INDEPENDENT_AMBULATORY_CARE_PROVIDER_SITE_OTHER): Payer: Medicare Other | Admitting: Cardiovascular Disease

## 2014-11-25 ENCOUNTER — Encounter: Payer: Self-pay | Admitting: Cardiovascular Disease

## 2014-11-25 VITALS — BP 140/60 | HR 62 | Ht 69.0 in | Wt 162.0 lb

## 2014-11-25 DIAGNOSIS — Z7901 Long term (current) use of anticoagulants: Secondary | ICD-10-CM

## 2014-11-25 DIAGNOSIS — I482 Chronic atrial fibrillation, unspecified: Secondary | ICD-10-CM

## 2014-11-25 DIAGNOSIS — I429 Cardiomyopathy, unspecified: Secondary | ICD-10-CM

## 2014-11-25 DIAGNOSIS — I2510 Atherosclerotic heart disease of native coronary artery without angina pectoris: Secondary | ICD-10-CM

## 2014-11-25 DIAGNOSIS — Z45018 Encounter for adjustment and management of other part of cardiac pacemaker: Secondary | ICD-10-CM

## 2014-11-25 NOTE — Progress Notes (Signed)
Patient ID: [name], female   DOB: [birth year], 80 y.o.   MRN: [id number]     HPI: [name] is a 80 y.o. female who presents to the office today for  follow up cardiology evaluation.  [name] has known coronary artery disease and underwent bare-metal stenting to her circumflex coronary artery in 2009.  She had mild 30-40% RCA stenosis and a normal LAD. She has a history of permanent atrial fibrillation, as well as hyperthyroidism.  She status post permanent pacemaker for sick sinus syndrome.  She has remote history of polio.  She had been followed by [name] in the past for pacemaker followup  with his last office visit being in September 2014.  She has also issues with lower GI tract abnormality and sees [name] in [place].   An echo Doppler study in November 2013 showed an ejection fraction of 35-40%.  She had diastolic dysfunction with mild to moderate septal hypokinesis.  She is severe LA dilatation.  There was mild mitral annular calcification with mild-to-moderate mitral regurgitation.  She had mild TR.  There was evidence for aortic sclerosis without stenosis.  There was evidence for mild pulmonary hypertension.  I last saw her, she had noted recurrent leg swelling.  This has resolved with reinstitution of low-dose hydrochlorothiazide.  She also is a history of hypothyroidism.  She is on chronic Coumadin anticoagulation.  She tells me she went to the [place].  Laboratory last week and her INR was therapeutic at 2.4.  She has irritable bowel syndrome.  She has not undergone recent colonoscopy.  There had been concerns of her being on anticoagulation with Coumadin.  A followup echo Doppler study on 06/25/2014 revealed an EF of 60-65%.  There was severe biatrial enlargement.  There was evidence for mitral annular calcification with trivial MR.  Chest mild TR.  Pacemaker wire was present in the right heart.  She tells me that she has never instituted telephonic  monitoring of her pacemaker.  There were given a home device, but never instructed on how to utilize it.  Past Medical History  Diagnosis Date  . Coronary artery disease   . A-fib   . Stroke   . Pacemaker   . Arthritis   . Diabetes mellitus     x 10 yrs at least  . Thyroid disease   . Anxiety   . Polio   . Hx of echocardiogram 09/2012    showed Ef 35-40% with no significant valve disease except for mild to moderate MR. normal RVSP and left atrium was severly dilated.  Marland Kitchen History of stress test 08/2011    negative for ischemia it was nongated and nondiagnostic and compatible with a possible scar or peri-infraction ischemia.     Past Surgical History  Procedure Laterality Date  . Cardiac surgery    . 3 cardiac stents      she has had non-DES stent to the mid circumflex in 2009 and LAD stent to the mid-distal LAD  BMS June 18, 2010  . Abdominal hysterectomy  1978  . Tonsillectomy    . Appendectomy    . Pacemaker placement  08/11/2010    Medtronic Adapta   . Coronary angioplasty    . Left heart catheterization with coronary angiogram N/A 05/10/2013    Procedure: LEFT HEART CATHETERIZATION WITH CORONARY ANGIOGRAM;  Surgeon: Leslie Bihari, MD;  Location: [facility] CATH LAB;  Service: Cardiovascular;  Laterality: N/A;    Allergies  Allergen Reactions  .  Iohexol Other (See Comments)     Desc: CHEST PAIN, LOC AFTER HEART CATH, PT REFUSED DYE   . Penicillins Other (See Comments)    REACTION:  unknown  . Sulfur Other (See Comments)    REACTION:  unknown    Current Outpatient Prescriptions  Medication Sig Dispense Refill  . ALPRAZolam (XANAX) 0.25 MG tablet Take 0.25 mg by mouth 2 (two) times daily as needed for anxiety.    . dicyclomine (BENTYL) 10 MG capsule Take 1 capsule (10 mg total) by mouth daily. 60 capsule 5  . digoxin (LANOXIN) 0.125 MG tablet Take 1 tablet (0.125 mg total) by mouth daily. 30 tablet 6  . hydrochlorothiazide (MICROZIDE) 12.5 MG capsule Take 1 capsule (12.5 mg  total) by mouth daily. 90 capsule 3  . isosorbide mononitrate (IMDUR) 30 MG 24 hr tablet Take 1 tablet (30 mg total) by mouth daily. 30 tablet 6  . levothyroxine (SYNTHROID, LEVOTHROID) 100 MCG tablet Take 100 mcg by mouth daily.    . metoprolol (LOPRESSOR) 50 MG tablet Take 1 tablet (50 mg total) by mouth 2 (two) times daily. 60 tablet 3  . OVER THE COUNTER MEDICATION IBgard Take 1 by mouth daily.    . traMADol (ULTRAM) 50 MG tablet Take 25-50 mg by mouth every 6 (six) hours as needed for pain.     Marland Kitchen. warfarin (COUMADIN) 2.5 MG tablet TAKE 1 TABLET DAILY OR AS DIRECTED BY COUMADIN CLINIC 30 tablet 5   No current facility-administered medications for this visit.    History   Social History  . Marital Status: Married    Spouse Name: N/A    Number of Children: N/A  . Years of Education: N/A   Occupational History  . Not on file.   Social History Main Topics  . Smoking status: Never Smoker   . Smokeless tobacco: Never Used  . Alcohol Use: No  . Drug Use: No  . Sexual Activity: Not on file   Other Topics Concern  . Not on file   Social History Narrative    Socially she is married, has 5 children, 5 grandchildren 2 great-grandchildren.  She does not exercise.  Family History  Problem Relation Age of Onset  . Heart disease Father   . Cancer Mother     ROS General: Negative; No fevers, chills, or night sweats HEENT: Negative; No changes in vision or hearing, sinus congestion, difficulty swallowing Pulmonary: Negative; No cough, wheezing, shortness of breath, hemoptysis Cardiovascular: See HPI: No chest pain, presyncope, syncope, palpatations GI: Positive IBS-like symptoms.  She was recently told to take IBGuard; No nausea, vomiting, diarrhea.  Positive for GERD, as well as colonic diverticulitis remotely. GU: Negative; No dysuria, hematuria, or difficulty voiding Musculoskeletal: Negative; no myalgias, joint pain, or weakness Hematologic: Negative; no easy bruising,  bleeding Endocrine: Positive for hypothyroidism no diabetes, Neuro: Negative; no changes in balance, headaches Skin: Negative; No rashes or skin lesions Psychiatric: She does have mild anxiety.; No behavioral problems, depression Sleep: Negative; No snoring,  daytime sleepiness, hypersomnolence, bruxism, restless legs, hypnogognic hallucinations. Other comprehensive 14 point system review is negative   Physical Exam BP 140/60 mmHg  Pulse 62  Ht 5\' 9"  (1.753 m)  Wt 162 lb (73.483 kg)  BMI 23.91 kg/m2 Repeat blood pressure by me was 150/81 General: Alert, oriented, no distress.  Skin: normal turgor, no rashes, warm and dry HEENT: Normocephalic, atraumatic. Pupils equal round and reactive to light; sclera anicteric; extraocular muscles intact, No lid lag; Nose without  nasal septal hypertrophy; Mouth/Parynx benign; Mallinpatti scale 3 Neck: No JVD, no carotid bruits; normal carotid upstroke Lungs: clear to ausculatation and percussion bilaterally; no wheezing or rales, normal inspiratory and expiratory effort Chest wall: without tenderness to palpitation Heart: PMI not displaced, RRR, s1 s2 normal, 2/6 systolic murmur, No diastolic murmur, no rubs, gallops, thrills, or heaves Abdomen: soft, nontender; no hepatosplenomehaly, BS+; abdominal aorta nontender and not dilated by palpation. Back: no CVA tenderness Pulses: 2+  Musculoskeletal: full range of motion, normal strength, no joint deformities Extremities: Trivial edema; Pulses 2+, no clubbing cyanosis , Homan's sign negative  Neurologic: grossly nonfocal; Cranial nerves grossly wnl Psychologic: Normal mood and affect  ECG (independently read by me): Underlying atrial fibrillation with ventricular paced Leslie at 62 bpm.  Prior August 2015 ECG (independently read by the): Atrial fibrillation at 64 beats per minute.  QTc interval 466 ms.  Paced Leslie   05/10/2014 ECG (independently read by me): Atrial fibrillation at 71 beats per  minute.  Isolated PVC.  Nonspecific ST changes.  LABS:  BMET    Component Value Date/Time   NA 138 09/30/2013 1255   K 4.2 09/30/2013 1255   CL 102 09/30/2013 1255   CO2 30 09/30/2013 1255   GLUCOSE 95 09/30/2013 1255   BUN 17 09/30/2013 1255   CREATININE 0.58 02/14/2014 1502   CREATININE 0.56 09/30/2013 1255   CALCIUM 9.1 09/30/2013 1255   GFRNONAA 85* 09/30/2013 1255   GFRAA >90 09/30/2013 1255     Hepatic Function Panel     Component Value Date/Time   PROT 6.3 09/30/2013 1255   ALBUMIN 3.5 09/30/2013 1255   AST 13 09/30/2013 1255   ALT 11 09/30/2013 1255   ALKPHOS 75 09/30/2013 1255   BILITOT 0.6 09/30/2013 1255   BILIDIR 0.2 11/08/2012 2053   IBILI 0.9 11/08/2012 2053     CBC    Component Value Date/Time   WBC 7.1 09/30/2013 1255   RBC 4.63 09/30/2013 1255   HGB 13.7 09/30/2013 1255   HCT 40.8 09/30/2013 1255   PLT 204 09/30/2013 1255   MCV 88.1 09/30/2013 1255   MCH 29.6 09/30/2013 1255   MCHC 33.6 09/30/2013 1255   RDW 14.1 09/30/2013 1255   LYMPHSABS 1.8 09/30/2013 1255   MONOABS 0.8 09/30/2013 1255   EOSABS 0.2 09/30/2013 1255   BASOSABS 0.0 09/30/2013 1255     BNP No results found for: PROBNP  Lipid Panel     Component Value Date/Time   CHOL  06/16/2010 0401    191        ATP III CLASSIFICATION:  <200     mg/dL   Desirable  161-096  mg/dL   Borderline High  >=045    mg/dL   High          TRIG 409 06/16/2010 0401   HDL 53 06/16/2010 0401   CHOLHDL 3.6 06/16/2010 0401   VLDL 22 06/16/2010 0401   LDLCALC * 06/16/2010 0401    116        Total Cholesterol/HDL:CHD Risk Coronary Heart Disease Risk Table                     Men   Women  1/2 Average Risk   3.4   3.3  Average Risk       5.0   4.4  2 X Average Risk   9.6   7.1  3 X Average Risk  23.4   11.0  Use the calculated Patient Ratio above and the CHD Risk Table to determine the patient's CHD Risk.        ATP III CLASSIFICATION (LDL):  <100     mg/dL   Optimal  562-130   mg/dL   Near or Above                    Optimal  130-159  mg/dL   Borderline  865-784  mg/dL   High  >696     mg/dL   Very High     RADIOLOGY: No results found.    ASSESSMENT AND PLAN: Ms. Dsouza has a history of known CAD and is 6 years status post bare-metal stenting to her left circumflex coronary artery.  She had mild concomitant CAD involving the RCA.  She has a permanent pacemaker for sick sinus syndrome and has permanent atrial fibrillation on chronic warfarin therapy.  She has a history of peripheral edema but this has resolved with initiation of hydrochlorothiazide.  Her blood pressure today is stable at 140/60.  Currently on metoprolol 50 g twice a day in addition to HCTZ 12.5 mg.  Having any anginal symptoms and continues to be on isosorbide 30 mg in addition to her beta blocker therapy.  Her underlying atrial fibrillation rate is controlled.  She is also on Lanoxin 0.125 mg.  Her INR is 2.4 and this is therapeutic.  I discussed with her today whether or not she had been utilizing telephonic transmission for pacemaker interrogation.  Apparently, she has never been set up for this.  I have recommended she have an appointment to see Dr. Royann Shivers for a yearly pacemaker assessment since the last one performed by [name] was on 08/20/2013.  At that time she was in VVI mode with a lower rate of 60 and she was ventricular paced 40% of the time.  She will also then be set up for telephonic monitoring.  If she does require future colonoscopy, Coumadin will need to be discontinued for approximately 3-4 days.  It may be possible to consider NOAC therapy such as Eliquis after her procedure to allow for return to a therapeutic anticoagulant state  several hours after the initial dose.  I will see her in 6 months for cardiology reevaluation.   Leslie Bihari, MD, [facility]  11/25/2014 1:49 PM

## 2014-11-25 NOTE — Patient Instructions (Signed)
NO CHANGE IN MEDICATIONS.  Your physician recommends that you schedule a follow-up appointment with Dr Royann Shivers - pacemaker devic interrogation.  Your physician wants you to follow-up in 6 month Dr Tresa Endo.  You will receive a reminder letter in the mail two months in advance. If you don't receive a letter, please call our office to schedule the follow-up appointment.

## 2014-11-29 ENCOUNTER — Other Ambulatory Visit: Payer: Self-pay | Admitting: Cardiovascular Disease

## 2014-12-02 NOTE — Telephone Encounter (Signed)
Rx(s) sent to pharmacy electronically.  

## 2014-12-21 ENCOUNTER — Emergency Department (HOSPITAL_COMMUNITY)
Admission: EM | Admit: 2014-12-21 | Discharge: 2014-12-21 | Disposition: A | Discharge status: Home / Self Care | Payer: Medicare HMO | Attending: Emergency Medicine | Admitting: Emergency Medicine

## 2014-12-21 ENCOUNTER — Encounter (HOSPITAL_COMMUNITY): Payer: Self-pay | Admitting: Nurse Practitioner

## 2014-12-21 ENCOUNTER — Emergency Department (HOSPITAL_COMMUNITY): Payer: Medicare HMO

## 2014-12-21 ENCOUNTER — Telehealth: Payer: Self-pay | Admitting: Physician Assistant

## 2014-12-21 DIAGNOSIS — Z9861 Coronary angioplasty status: Secondary | ICD-10-CM | POA: Insufficient documentation

## 2014-12-21 DIAGNOSIS — R0789 Other chest pain: Secondary | ICD-10-CM | POA: Insufficient documentation

## 2014-12-21 DIAGNOSIS — Z8673 Personal history of transient ischemic attack (TIA), and cerebral infarction without residual deficits: Secondary | ICD-10-CM | POA: Insufficient documentation

## 2014-12-21 DIAGNOSIS — Z9889 Other specified postprocedural states: Secondary | ICD-10-CM | POA: Diagnosis not present

## 2014-12-21 DIAGNOSIS — Z880 Allergy status to penicillin: Secondary | ICD-10-CM | POA: Insufficient documentation

## 2014-12-21 DIAGNOSIS — R079 Chest pain, unspecified: Secondary | ICD-10-CM

## 2014-12-21 DIAGNOSIS — Z79899 Other long term (current) drug therapy: Secondary | ICD-10-CM | POA: Diagnosis not present

## 2014-12-21 DIAGNOSIS — R0602 Shortness of breath: Secondary | ICD-10-CM | POA: Diagnosis not present

## 2014-12-21 DIAGNOSIS — E079 Disorder of thyroid, unspecified: Secondary | ICD-10-CM | POA: Insufficient documentation

## 2014-12-21 DIAGNOSIS — F419 Anxiety disorder, unspecified: Secondary | ICD-10-CM | POA: Diagnosis not present

## 2014-12-21 DIAGNOSIS — I2510 Atherosclerotic heart disease of native coronary artery without angina pectoris: Secondary | ICD-10-CM | POA: Diagnosis not present

## 2014-12-21 DIAGNOSIS — Z7901 Long term (current) use of anticoagulants: Secondary | ICD-10-CM | POA: Diagnosis not present

## 2014-12-21 DIAGNOSIS — I4891 Unspecified atrial fibrillation: Secondary | ICD-10-CM | POA: Insufficient documentation

## 2014-12-21 DIAGNOSIS — Z950 Presence of cardiac pacemaker: Secondary | ICD-10-CM | POA: Diagnosis not present

## 2014-12-21 DIAGNOSIS — E119 Type 2 diabetes mellitus without complications: Secondary | ICD-10-CM | POA: Diagnosis not present

## 2014-12-21 DIAGNOSIS — Z8612 Personal history of poliomyelitis: Secondary | ICD-10-CM | POA: Insufficient documentation

## 2014-12-21 DIAGNOSIS — Z8739 Personal history of other diseases of the musculoskeletal system and connective tissue: Secondary | ICD-10-CM | POA: Insufficient documentation

## 2014-12-21 LAB — CBC
HCT: 43.6 % (ref 36.0–46.0)
Hemoglobin: 14.2 g/dL (ref 12.0–15.0)
MCH: 28.7 pg (ref 26.0–34.0)
MCHC: 32.6 g/dL (ref 30.0–36.0)
MCV: 88.3 fL (ref 78.0–100.0)
Platelets: 212 10*3/uL (ref 150–400)
RBC: 4.94 MIL/uL (ref 3.87–5.11)
RDW: 14.4 % (ref 11.5–15.5)
WBC: 6.9 10*3/uL (ref 4.0–10.5)

## 2014-12-21 LAB — I-STAT TROPONIN, ED
TROPONIN I, POC: 0 ng/mL (ref 0.00–0.08)
Troponin i, poc: 0 ng/mL (ref 0.00–0.08)

## 2014-12-21 LAB — BASIC METABOLIC PANEL
Anion gap: 8 (ref 5–15)
BUN: 20 mg/dL (ref 6–23)
CO2: 31 mmol/L (ref 19–32)
Calcium: 9.3 mg/dL (ref 8.4–10.5)
Chloride: 98 mmol/L (ref 96–112)
Creatinine, Ser: 0.67 mg/dL (ref 0.50–1.10)
GFR calc Af Amer: >90 mL/min (ref >90)
GFR calc non Af Amer: 80 mL/min — ABNORMAL LOW (ref >90)
Glucose, Bld: 89 mg/dL (ref 70–99)
Potassium: 4 mmol/L (ref 3.5–5.1)
SODIUM: 137 mmol/L (ref 135–145)

## 2014-12-21 LAB — PROTIME-INR
INR: 2.36 — ABNORMAL HIGH (ref 0.00–1.49)
Prothrombin Time: 26 seconds — ABNORMAL HIGH (ref 11.6–15.2)

## 2014-12-21 NOTE — ED Notes (Signed)
She reports several episodes of "sharp intermittent" chest pain under her L breast throughout the day today. she denies pain now. She felt SOB this am

## 2014-12-21 NOTE — ED Provider Notes (Signed)
CSN: [id number]     Arrival date & time 12/21/14  1722 History   First MD Initiated Contact with Patient 12/21/14 1743     Chief Complaint  Patient presents with  . Chest Pain     (Consider location/radiation/quality/duration/timing/severity/associated sxs/prior Treatment) HPI Comments: The patient is a 81 year old female with past medical history of A. Fib, CAD s/p stenting, polio, thyroid disease, diabetes presenting the emergency department the chief complaint of left-sided chest discomfort since 0900 today. Patient reports 3 episodes of intermittent chest discomfort described as sharp, lasting seconds, self resolving, nonradiating. No discomfort at this time. Discomfort not exacerbated by exertion, deep inspirations. Patient reports associated dyspnea and initial onset due to "it's scaring me". Patient denies shortness of breath at this time. Patient denies palpitations, lower extremity edema, calf pain, abdominal discomfort, cough, fever. Denies defibrillator firing.  Reports compliance with coumadin. Reports taking "a piece" of xanax 0.25mg  just prior to arrival. PCP: [facility] Cardiologist: Tresa Endo   Patient is a 81 y.o. female presenting with chest pain. The history is provided by the patient. No language interpreter was used.  Chest Pain Associated symptoms: shortness of breath   Associated symptoms: no abdominal pain, no cough, no fever, no nausea, no palpitations and not vomiting     Past Medical History  Diagnosis Date  . Coronary artery disease   . A-fib   . Stroke   . Pacemaker   . Arthritis   . Diabetes mellitus     x 10 yrs at least  . Thyroid disease   . Anxiety   . Polio   . Hx of echocardiogram 09/2012    showed Ef 35-40% with no significant valve disease except for mild to moderate MR. normal RVSP and left atrium was severly dilated.  Marland Kitchen History of stress test 08/2011    negative for ischemia it was nongated and nondiagnostic and compatible with a possible scar or  peri-infraction ischemia.    Past Surgical History  Procedure Laterality Date  . Cardiac surgery    . 3 cardiac stents      she has had non-DES stent to the mid circumflex in 2009 and LAD stent to the mid-distal LAD  BMS June 18, 2010  . Abdominal hysterectomy  1978  . Tonsillectomy    . Appendectomy    . Pacemaker placement  08/11/2010    Medtronic Adapta   . Coronary angioplasty    . Left heart catheterization with coronary angiogram N/A 05/10/2013    Procedure: LEFT HEART CATHETERIZATION WITH CORONARY ANGIOGRAM;  Surgeon: Lennette Bihari, MD;  Location: [facility] CATH LAB;  Service: Cardiovascular;  Laterality: N/A;   Family History  Problem Relation Age of Onset  . Heart disease Father   . Cancer Mother    History  Substance Use Topics  . Smoking status: Never Smoker   . Smokeless tobacco: Never Used  . Alcohol Use: No   OB History    No data available     Review of Systems  Constitutional: Negative for fever and chills.  Respiratory: Positive for shortness of breath. Negative for cough.   Cardiovascular: Positive for chest pain. Negative for palpitations and leg swelling.  Gastrointestinal: Negative for nausea, vomiting, abdominal pain, diarrhea and constipation.      Allergies  Iohexol; Penicillins; and Sulfur  Home Medications   Prior to Admission medications   Medication Sig Start Date End Date Taking? Authorizing Provider  ALPRAZolam (XANAX) 0.25 MG tablet Take 0.25 mg by mouth 2 (two)  times daily as needed for anxiety.    Historical Provider, MD  dicyclomine (BENTYL) 10 MG capsule Take 1 capsule (10 mg total) by mouth daily. 07/01/14   Malissa Hippo, MD  digoxin (LANOXIN) 0.125 MG tablet Take 1 tablet (0.125 mg total) by mouth daily. 05/29/14   Lennette Bihari, MD  hydrochlorothiazide (MICROZIDE) 12.5 MG capsule Take 1 capsule (12.5 mg total) by mouth daily. 05/10/14   Lennette Bihari, MD  isosorbide mononitrate (IMDUR) 30 MG 24 hr tablet Take 1 tablet (30 mg total) by  mouth daily. 05/29/14   Lennette Bihari, MD  levothyroxine (SYNTHROID, LEVOTHROID) 100 MCG tablet Take 100 mcg by mouth daily.    Historical Provider, MD  metoprolol (LOPRESSOR) 50 MG tablet TAKE 1 TABLET (50 MG TOTAL) BY MOUTH 2 (TWO) TIMES DAILY. 12/02/14   Lennette Bihari, MD  OVER THE COUNTER MEDICATION IBgard Take 1 by mouth daily.    Historical Provider, MD  traMADol (ULTRAM) 50 MG tablet Take 25-50 mg by mouth every 6 (six) hours as needed for pain.     Historical Provider, MD  warfarin (COUMADIN) 2.5 MG tablet TAKE 1 TABLET DAILY OR AS DIRECTED BY COUMADIN CLINIC 11/04/14   Lennette Bihari, MD   There were no vitals taken for this visit. Physical Exam  Constitutional: She is oriented to person, place, and time. She appears well-developed and well-nourished. No distress.  HENT:  Head: Normocephalic and atraumatic.  Eyes: EOM are normal.  Neck: Neck supple.  Cardiovascular: Normal rate and regular rhythm.   No lower extremity edema.  Pulmonary/Chest: Effort normal and breath sounds normal. No respiratory distress. She has no wheezes. She has no rales. She exhibits tenderness.  Mild left chest wall tenderness.   Abdominal: Soft. There is no tenderness. There is no rebound and no guarding.  Neurological: She is alert and oriented to person, place, and time.  Skin: Skin is warm and dry. She is not diaphoretic.  Psychiatric: She has a normal mood and affect. Her behavior is normal.  Nursing note and vitals reviewed.   ED Course  Procedures (including critical care time) Labs Review Labs Reviewed  BASIC METABOLIC PANEL - Abnormal; Notable for the following:    GFR calc non Af Amer 80 (*)    All other components within normal limits  PROTIME-INR - Abnormal; Notable for the following:    Prothrombin Time 26.0 (*)    INR 2.36 (*)    All other components within normal limits  CBC  I-STAT TROPOININ, ED  Rosezena Sensor, ED    Imaging Review Dg Chest 2 View  12/21/2014   CLINICAL DATA:   Chest pain with shortness of breath. History of diabetes, hypertension. Initial encounter.  EXAM: CHEST  2 VIEW  COMPARISON:  Radiographs 05/08/2013.  FINDINGS: The left subclavian pacemaker lead is unchanged within the right ventricle. The heart size is stable at the upper limits of normal. The lungs are clear. There is no pleural effusion or pneumothorax. No acute osseous findings evident.  IMPRESSION: No active cardiopulmonary process.   Electronically Signed   By: Roxy Horseman M.D.   On: 12/21/2014 18:19     EKG Interpretation   Date/Time:  Saturday December 21 2014 19:17:31 EST Ventricular Rate:  60 PR Interval:    QRS Duration: 152 QT Interval:  446 QTC Calculation: 446 R Axis:   140 Text Interpretation:  Atrial fibrillation Nonspecific intraventricular  conduction delay Lateral infarct, recent Probable anteroseptal infarct,  old  Confirmed by DOCHERTY  MD, MEGAN [phone]) on 12/21/2014 7:22:12 PM      MDM   Final diagnoses:  Chest pain, unspecified chest pain type   The patient is a 81 year old female presents emergency room chief complaint of chest discomfort, known history of CAD status post stenting and known history of A. fib on Coumadin. Discomfort is atypical from ACS, plan to rule out ACS given risk factors.  Chest wall discomfort different than chief complaint discomfort. Pt recently evaluated by Dr., Tresa Endo in December. Echo 05/2014 60-65% Discussed patient history, condition with Dr. Micheline Maze who also evaluated the patient during this encounter.  Cardiology paged. Discussed pt history condition with Dr. Hiram Comber, agrees to evaluate the patient in the ED. After his evaluation and negative second troponin the patient will be discharged home with follow up with Dr. Tresa Endo.    Mellody Drown, PA-C 12/21/14 2313  Mellody Drown, PA-C 12/21/14 [id number]  Toy Cookey, MD 12/21/14 [phone]

## 2014-12-21 NOTE — Consult Note (Signed)
Referring Physician: No referring provider defined for this encounter. Primary Physician: Azalee Course, CMA Primary Cardiologist: Nicki Guadalajara Reason for Consultation: chest pain  HPI: [name] has known coronary artery disease and underwent bare-metal stenting to her circumflex coronary artery in 2009. She had mild 30-40% RCA stenosis and a normal LAD. She has a history of permanent atrial fibrillation, as well as hyperthyroidism. She status post permanent pacemaker for sick sinus syndrome. Her most recent ECHO study on 06/25/2014 revealed an EF of 60-65%. There was severe biatrial enlargement. There was evidence for mitral annular calcification with trivial MR. Chest mild TR. Pacemaker wire was present in the right heart.  Patient presented with chief complaint of chest pain. She reported that she had about 3 episodes on the day of presentation, each episode was left-sided sharp, nonradiating, nonexertional, no all this relieving or exacerbating factors, no associated nausea, sweats, palpitations. Each episode lasted up to a minute maximum but after talking to the patient sounded more like a few seconds. Patient that really concerned about those episodes however reported that they felt completely different compared to her myocardial infarction in 2009. She also reported that she climbed 2 flights of stairs on the day of presentation without having any dyspnea or chest pain. Overall patient appears to be anxious during the interview she was concerned that the battery of her pacemaker is running out even though there was no indication of it in the previous records. Pacemaker interrogation in the ED showed normal functioning pacemaker.  Patient denied active symptoms of chest pain, shortness of breath, syncope or presyncope, lower extremity edema, PND or orthopnea, frequent or prolonged palpitations, dysuria, diarrhea, nausea, chills, fevers, cough.  Review of Systems:  12 systems were  reviewed nad were negative except mentioned in the HPI     Past Medical History  Diagnosis Date  . Coronary artery disease   . A-fib   . Stroke   . Pacemaker   . Arthritis   . Diabetes mellitus     x 10 yrs at least  . Thyroid disease   . Anxiety   . Polio   . Hx of echocardiogram 09/2012    showed Ef 35-40% with no significant valve disease except for mild to moderate MR. normal RVSP and left atrium was severly dilated.  Marland Kitchen History of stress test 08/2011    negative for ischemia it was nongated and nondiagnostic and compatible with a possible scar or peri-infraction ischemia.    Past Surgical History  Procedure Laterality Date  . Cardiac surgery    . 3 cardiac stents      she has had non-DES stent to the mid circumflex in 2009 and LAD stent to the mid-distal LAD  BMS June 18, 2010  . Abdominal hysterectomy  1978  . Tonsillectomy    . Appendectomy    . Pacemaker placement  08/11/2010    Medtronic Adapta   . Coronary angioplasty    . Left heart catheterization with coronary angiogram N/A 05/10/2013    Procedure: LEFT HEART CATHETERIZATION WITH CORONARY ANGIOGRAM;  Surgeon: Lennette Bihari, MD;  Location: [facility] CATH LAB;  Service: Cardiovascular;  Laterality: N/A;     Current Medications:  Infusions:    (Not in a hospital admission)   Allergies  Allergen Reactions  . Iohexol Other (See Comments)     Desc: CHEST PAIN, LOC AFTER HEART CATH, PT REFUSED DYE   . Penicillins Other (See Comments)    REACTION:  unknown  .  Sulfur Other (See Comments)    REACTION:  unknown    History   Social History  . Marital Status: Married    Spouse Name: N/A    Number of Children: N/A  . Years of Education: N/A   Occupational History  . Not on file.   Social History Main Topics  . Smoking status: Never Smoker   . Smokeless tobacco: Never Used  . Alcohol Use: No  . Drug Use: No  . Sexual Activity: Not on file   Other Topics Concern  . Not on file   Social History  Narrative    Family History  Problem Relation Age of Onset  . Heart disease Father   . Cancer Mother    Family Status  Relation Status Death Age  . Father Deceased   . Mother Deceased     female organs    PHYSICAL EXAM: Filed Vitals:   12/21/14 2136  BP: 148/77  Pulse: 60  Temp: 97.5 F (36.4 C)  Resp: 16    No intake or output data in the 24 hours ending 12/21/14 2144  General:  Well appearing. No respiratory difficulty HEENT: normal Neck: supple. no JVD. Carotids 2+ bilat; no bruits. No lymphadenopathy or thryomegaly appreciated. Cor: PMI nondisplaced. Regular rate & rhythm. No rubs, gallops or murmurs. Lungs: clear Abdomen: soft, nontender, nondistended. No hepatosplenomegaly. No bruits or masses. Good bowel sounds. Extremities: no cyanosis, clubbing, rash, edema Neuro: alert & oriented x 3, cranial nerves grossly intact. moves all 4 extremities w/o difficulty. Affect pleasant.  Results for orders placed or performed during the hospital encounter of 12/21/14 (from the past 24 hour(s))  Basic metabolic panel     Status: Abnormal   Collection Time: 12/21/14  5:29 PM  Result Value Ref Range   Sodium 137 135 - 145 mmol/L   Potassium 4.0 3.5 - 5.1 mmol/L   Chloride 98 96 - 112 mmol/L   CO2 31 19 - 32 mmol/L   Glucose, Bld 89 70 - 99 mg/dL   BUN 20 6 - 23 mg/dL   Creatinine, Ser 1.61 0.50 - 1.10 mg/dL   Calcium 9.3 8.4 - 09.6 mg/dL   GFR calc non Af Amer 80 (L) >90 mL/min   GFR calc Af Amer >90 >90 mL/min   Anion gap 8 5 - 15  CBC     Status: None   Collection Time: 12/21/14  5:29 PM  Result Value Ref Range   WBC 6.9 4.0 - 10.5 K/uL   RBC 4.94 3.87 - 5.11 MIL/uL   Hemoglobin 14.2 12.0 - 15.0 g/dL   HCT 04.5 40.9 - 81.1 %   MCV 88.3 78.0 - 100.0 fL   MCH 28.7 26.0 - 34.0 pg   MCHC 32.6 30.0 - 36.0 g/dL   RDW 91.4 78.2 - 95.6 %   Platelets 212 150 - 400 K/uL  Protime-INR     Status: Abnormal   Collection Time: 12/21/14  5:48 PM  Result Value Ref Range    Prothrombin Time 26.0 (H) 11.6 - 15.2 seconds   INR 2.36 (H) 0.00 - 1.49  I-stat troponin, ED (not at [facility])     Status: None   Collection Time: 12/21/14  6:03 PM  Result Value Ref Range   Troponin i, poc 0.00 0.00 - 0.08 ng/mL   Comment 3          I-stat troponin, ED     Status: None   Collection Time: 12/21/14  8:58 PM  Result Value Ref Range   Troponin i, poc 0.00 0.00 - 0.08 ng/mL   Comment 3           Radiology:  Dg Chest 2 View  12/21/2014   CLINICAL DATA:  Chest pain with shortness of breath. History of diabetes, hypertension. Initial encounter.  EXAM: CHEST  2 VIEW  COMPARISON:  Radiographs 05/08/2013.  FINDINGS: The left subclavian pacemaker lead is unchanged within the right ventricle. The heart size is stable at the upper limits of normal. The lungs are clear. There is no pleural effusion or pneumothorax. No acute osseous findings evident.  IMPRESSION: No active cardiopulmonary process.   Electronically Signed   By: Roxy Horseman M.D.   On: 12/21/2014 18:19    ECG: Atrial fibrillation, demand RV pacemaker  Limitted bedside echocardiogram performed by me : Normal RV and LV function, severely biatrial enlargement, elevated LV filling pressures,   ASSESSMENT: Noncardiac/atypical chest pain  PLAN/DISCUSSION: Doesn't have any concerning signs. She does not have any acute current pulmonary process on her chest x-ray, her troponin 2 was negative. She will manage to climb 2 flights of stairs today with no shortness of breath and/or chest pain. Ocean of the chest pain is completely atypical. Patient appears to be somewhat anxious.  - safe for the discharge and the ED - Plan on phone follow-up tomorrow   Nolon Nations, MD 12/21/2014 9:44 PM

## 2014-12-21 NOTE — ED Notes (Signed)
MD at bedside. 

## 2014-12-21 NOTE — Telephone Encounter (Signed)
     She has been having chest pain while sitting this AM. It is her left side and radiates to her left axilla. She is also been having SOB. The chest pain is similar to when she had her prior stent placement. She just feels off and wants to come to the hospital. I agreed this sounded reasonable.     Cline Crock PA-C  [facility]

## 2014-12-21 NOTE — ED Notes (Signed)
PA at bedside.

## 2014-12-21 NOTE — Discharge Instructions (Signed)
Call your cardiologist for further evaluation for your chest pain. Call for a follow up appointment with a Family or Primary Care Provider.  Return if Symptoms worsen.   Take medication as prescribed.

## 2014-12-25 ENCOUNTER — Other Ambulatory Visit: Payer: Self-pay

## 2014-12-25 ENCOUNTER — Other Ambulatory Visit: Payer: Self-pay | Admitting: Family Medicine

## 2014-12-25 DIAGNOSIS — N644 Mastodynia: Secondary | ICD-10-CM

## 2014-12-26 ENCOUNTER — Other Ambulatory Visit: Payer: Self-pay | Admitting: Family Medicine

## 2014-12-26 DIAGNOSIS — N644 Mastodynia: Secondary | ICD-10-CM

## 2014-12-27 ENCOUNTER — Encounter: Payer: Self-pay | Admitting: *Deleted

## 2014-12-30 ENCOUNTER — Telehealth: Payer: Self-pay | Admitting: Cardiovascular Disease

## 2014-12-30 ENCOUNTER — Ambulatory Visit (INDEPENDENT_AMBULATORY_CARE_PROVIDER_SITE_OTHER): Payer: Medicare HMO | Admitting: *Deleted

## 2014-12-30 ENCOUNTER — Other Ambulatory Visit: Payer: Self-pay | Admitting: Cardiovascular Disease

## 2014-12-30 DIAGNOSIS — Z5181 Encounter for therapeutic drug level monitoring: Secondary | ICD-10-CM

## 2014-12-30 DIAGNOSIS — I482 Chronic atrial fibrillation, unspecified: Secondary | ICD-10-CM

## 2014-12-30 DIAGNOSIS — Z7901 Long term (current) use of anticoagulants: Secondary | ICD-10-CM

## 2014-12-30 DIAGNOSIS — I4891 Unspecified atrial fibrillation: Secondary | ICD-10-CM

## 2014-12-30 LAB — POCT INR: INR: 2.8

## 2014-12-30 MED ORDER — DIGOXIN 125 MCG PO TABS: 0.1250 mg | ORAL_TABLET | Freq: Every day | ORAL | Status: DC

## 2014-12-30 NOTE — Telephone Encounter (Signed)
Pt need a new prescription for her Digoxin,she changed pharmacy because of her insurance. Please call this to [facility]-[phone].

## 2014-12-30 NOTE — Telephone Encounter (Signed)
Encounter opened by mistake, Rx already sent in by Julaine Fusi RN per Triage

## 2014-12-30 NOTE — Addendum Note (Signed)
Addended by: Lindell Spar on: 12/30/2014 10:45 AM   Modules accepted: Orders

## 2014-12-30 NOTE — Telephone Encounter (Signed)
Pt is changing pharmacy because of her insurance. Please call her Digoxin with refills to [facility]-[phone].

## 2014-12-30 NOTE — Telephone Encounter (Signed)
Rx(s) sent to pharmacy electronically.  

## 2014-12-31 ENCOUNTER — Ambulatory Visit (INDEPENDENT_AMBULATORY_CARE_PROVIDER_SITE_OTHER): Payer: Medicare HMO | Admitting: Cardiovascular Disease

## 2014-12-31 ENCOUNTER — Encounter: Payer: Medicare Other | Admitting: Cardiovascular Disease

## 2014-12-31 ENCOUNTER — Encounter: Payer: Self-pay | Admitting: Cardiovascular Disease

## 2014-12-31 VITALS — BP 128/86 | HR 63 | Resp 16 | Ht 69.0 in | Wt 166.4 lb

## 2014-12-31 DIAGNOSIS — I2510 Atherosclerotic heart disease of native coronary artery without angina pectoris: Secondary | ICD-10-CM

## 2014-12-31 DIAGNOSIS — I482 Chronic atrial fibrillation, unspecified: Secondary | ICD-10-CM

## 2014-12-31 DIAGNOSIS — R079 Chest pain, unspecified: Secondary | ICD-10-CM

## 2014-12-31 DIAGNOSIS — Z7901 Long term (current) use of anticoagulants: Secondary | ICD-10-CM

## 2014-12-31 DIAGNOSIS — I429 Cardiomyopathy, unspecified: Secondary | ICD-10-CM

## 2014-12-31 DIAGNOSIS — Z950 Presence of cardiac pacemaker: Secondary | ICD-10-CM

## 2014-12-31 DIAGNOSIS — I119 Hypertensive heart disease without heart failure: Secondary | ICD-10-CM

## 2014-12-31 DIAGNOSIS — I495 Sick sinus syndrome: Secondary | ICD-10-CM

## 2014-12-31 LAB — MDC_IDC_ENUM_SESS_TYPE_INCLINIC
Battery Impedance: 1147 Ohm
Battery Voltage: 2.76 V
Lead Channel Impedance Value: 638 Ohm
Lead Channel Sensing Intrinsic Amplitude: 5.6 mV
Lead Channel Setting Pacing Amplitude: 2 V
Lead Channel Setting Sensing Sensitivity: 2 mV
MDC IDC MSMT LEADCHNL RV PACING THRESHOLD AMPLITUDE: 0.375 V
MDC IDC MSMT LEADCHNL RV PACING THRESHOLD PULSEWIDTH: 0.4 ms
MDC IDC SET LEADCHNL RV PACING PULSEWIDTH: 0.4 ms
MDC IDC STAT BRADY RV PERCENT PACED: 43.2 %

## 2014-12-31 NOTE — Progress Notes (Signed)
Patient ID: [name], female   DOB: [birth year], 81 y.o.   MRN: [id number]     Reason for office visit atrial fibrillation with slow ventricular rate, pacemaker check  This is my first encounter with [name], previously a patient of Dr. Alanda Amass, now followed by Dr. Tresa Endo. She is here to establish pacemaker followup.   She describes left chest sharp pain, maybe from her breast, different from the deep ache under her left ribcage with prior ischemic events. The pacemaker site is not tender and looks healthy.  She has a single chamber Medtronic Adapta device with an active fixation RV lead placed high in the RVOT on chest xray. Roughly 5 years estimated battery longevity. Fair lead parameters, stable. Permanent AF with roughly 43% V pacing, extremely rare and brief RVR.  [name] has known coronary artery disease and underwent bare-metal stenting to her circumflex coronary artery in 2009. She had mild 30-40% RCA stenosis and a normal LAD. She has a history of permanent atrial fibrillation, as well as hyperthyroidism. She status post permanent pacemaker for sick sinus syndrome. She has remote history of polio. She had been followed by Dr. Alanda Amass in the past for pacemaker followup with his last office visit being in September 2014. She has also issues with lower GI tract abnormality and sees Dr. Karilyn Cota in [place]. An echo Doppler study in November 2013 showed an ejection fraction of 35-40%. She had diastolic dysfunction with mild to moderate septal hypokinesis. She is severe LA dilatation. There was mild mitral annular calcification with mild-to-moderate mitral regurgitation. She had mild TR. There was evidence for aortic sclerosis without stenosis. There was evidence for mild pulmonary hypertension. Allergies  Allergen Reactions  . Iohexol Other (See Comments)     Desc: CHEST PAIN, LOC AFTER HEART CATH, PT REFUSED DYE   . Penicillins Other (See Comments)    REACTION:   unknown  . Sulfur Other (See Comments)    REACTION:  unknown    Current Outpatient Prescriptions  Medication Sig Dispense Refill  . ALPRAZolam (XANAX) 0.25 MG tablet Take 0.25 mg by mouth 2 (two) times daily as needed for anxiety.    . dicyclomine (BENTYL) 10 MG capsule Take 1 capsule (10 mg total) by mouth daily. 60 capsule 5  . digoxin (LANOXIN) 0.125 MG tablet Take 1 tablet (0.125 mg total) by mouth daily. 30 tablet 6  . hydrochlorothiazide (MICROZIDE) 12.5 MG capsule Take 1 capsule (12.5 mg total) by mouth daily. 90 capsule 3  . isosorbide mononitrate (IMDUR) 30 MG 24 hr tablet Take 1 tablet (30 mg total) by mouth daily. 30 tablet 6  . levothyroxine (SYNTHROID, LEVOTHROID) 100 MCG tablet Take 100 mcg by mouth daily.    . metoprolol (LOPRESSOR) 50 MG tablet TAKE 1 TABLET (50 MG TOTAL) BY MOUTH 2 (TWO) TIMES DAILY. 60 tablet 11  . traMADol (ULTRAM) 50 MG tablet Take 25-50 mg by mouth every 6 (six) hours as needed for pain.     Marland Kitchen warfarin (COUMADIN) 2.5 MG tablet TAKE 1 TABLET DAILY OR AS DIRECTED BY COUMADIN CLINIC 30 tablet 5   No current facility-administered medications for this visit.    Past Medical History  Diagnosis Date  . A-fib   . Stroke   . Pacemaker   . Arthritis   . Diabetes mellitus     x 10 yrs at least  . Anxiety   . Polio   . Hx of echocardiogram 09/2012    showed Ef 35-40% with no  significant valve disease except for mild to moderate MR. normal RVSP and left atrium was severly dilated.  Marland Kitchen History of stress test 08/2011    negative for ischemia it was nongated and nondiagnostic and compatible with a possible scar or peri-infraction ischemia.   . Coronary artery disease 12/2007  . Hypertension   . Thyroid disease     hypothyroidism    Past Surgical History  Procedure Laterality Date  . Cardiac surgery    . 3 cardiac stents      she has had non-DES stent to the mid circumflex in 2009 and LAD stent to the mid-distal LAD  BMS June 18, 2010  . Abdominal  hysterectomy  1978  . Tonsillectomy    . Appendectomy    . Pacemaker placement  08/11/2010    Medtronic Adapta   . Coronary angioplasty    . Left heart catheterization with coronary angiogram N/A 05/10/2013    Procedure: LEFT HEART CATHETERIZATION WITH CORONARY ANGIOGRAM;  Surgeon: Lennette Bihari, MD;  Location: [facility] CATH LAB;  Service: Cardiovascular;  Laterality: N/A;  . Transthoracic echocardiogram  01/17/2009    EF=>55%.IMPAIRED LV RELAXATION.LA IS MODERATELY DILATED.RA IS MILDLY DILATED. RV SYSTOLIC PRESSURE IS ELEVATED AT 30-40MMHG.MILD TO MODERATE TR.MILD MR.SINCE PRIOR TTE,BOTH ATRIA APPEAR TO BE MORE DILATED.  . Stress perfusion test  10/22/2009    MILD ISCHEMIA IN THE BASAL ANTEROLATERAL AND MID ANTEROLATERAL REGIONS.EF 52%.    Family History  Problem Relation Age of Onset  . Heart disease Father   . Cancer Mother     History   Social History  . Marital Status: Married    Spouse Name: N/A    Number of Children: N/A  . Years of Education: N/A   Occupational History  . Not on file.   Social History Main Topics  . Smoking status: Never Smoker   . Smokeless tobacco: Never Used  . Alcohol Use: No  . Drug Use: No  . Sexual Activity: Not on file   Other Topics Concern  . Not on file   Social History Narrative    Review of systems: The patient specifically denies any chest pain with exertion, dyspnea at rest or with exertion, orthopnea, paroxysmal nocturnal dyspnea, syncope, palpitations, focal neurological deficits, intermittent claudication, lower extremity edema, unexplained weight gain, cough, hemoptysis or wheezing.  The patient also denies abdominal pain, nausea, vomiting, dysphagia, diarrhea, constipation, polyuria, polydipsia, dysuria, hematuria, frequency, urgency, abnormal bleeding or bruising, fever, chills, unexpected weight changes, mood swings, change in skin or hair texture, change in voice quality, auditory or visual problems, allergic reactions or rashes,  new musculoskeletal complaints other than usual "aches and pains".   PHYSICAL EXAM BP 128/86 mmHg  Pulse 63  Resp 16  Ht  (1.753 m)  Wt 166 lb 6.4 oz (75.479 kg)  BMI 24.56 kg/m2  General: Alert, oriented x3, no distress Head: no evidence of trauma, PERRL, EOMI, no exophtalmos or lid lag, no myxedema, no xanthelasma; normal ears, nose and oropharynx Neck: normal jugular venous pulsations and no hepatojugular reflux; brisk carotid pulses without delay and no carotid bruits Chest: clear to auscultation, no signs of consolidation by percussion or palpation, normal fremitus, symmetrical and full respiratory excursions; healthy left subclavian pacemaker site Cardiovascular: normal position and quality of the apical impulse, irregular rhythm, normal first and second heart sounds, no murmurs, rubs or gallops Abdomen: no tenderness or distention, no masses by palpation, no abnormal pulsatility or arterial bruits, normal bowel sounds, no hepatosplenomegaly Extremities:  no clubbing, cyanosis or edema; 2+ radial, ulnar and brachial pulses bilaterally; 2+ right femoral, posterior tibial and dorsalis pedis pulses; 2+ left femoral, posterior tibial and dorsalis pedis pulses; no subclavian or femoral bruits Neurological: grossly nonfocal   EKG: AFib, occasionally V paced, RBBB  Lipid Panel     Component Value Date/Time   CHOL  06/16/2010 0401    191        ATP III CLASSIFICATION:  <200     mg/dL   Desirable  762-263  mg/dL   Borderline High  >=335    mg/dL   High          TRIG 456 06/16/2010 0401   HDL 53 06/16/2010 0401   CHOLHDL 3.6 06/16/2010 0401   VLDL 22 06/16/2010 0401   LDLCALC * 06/16/2010 0401    116        Total Cholesterol/HDL:CHD Risk Coronary Heart Disease Risk Table                     Men   Women  1/2 Average Risk   3.4   3.3  Average Risk       5.0   4.4  2 X Average Risk   9.6   7.1  3 X Average Risk  23.4   11.0        Use the calculated Patient Ratio above  and the CHD Risk Table to determine the patient's CHD Risk.        ATP III CLASSIFICATION (LDL):  <100     mg/dL   Optimal  256-389  mg/dL   Near or Above                    Optimal  130-159  mg/dL   Borderline  373-428  mg/dL   High  >768     mg/dL   Very High    BMET    Component Value Date/Time   NA 137 12/21/2014 1729   K 4.0 12/21/2014 1729   CL 98 12/21/2014 1729   CO2 31 12/21/2014 1729   GLUCOSE 89 12/21/2014 1729   BUN 20 12/21/2014 1729   CREATININE 0.67 12/21/2014 1729   CREATININE 0.58 02/14/2014 1502   CALCIUM 9.3 12/21/2014 1729   GFRNONAA 80* 12/21/2014 1729   GFRAA >90 12/21/2014 1729     ASSESSMENT AND PLAN  Normal single chamber pacemaker function. AF with good rate control on appropriate anticoagulation. Enroll Carelink checks Q 3 months, yearly office check. Periodically reevaluate need for/safety of digoxin therapy (will defer to Dr. Tresa Endo).  Orders Placed This Encounter  Procedures  . Implantable device check  . EKG 12-Lead   No orders of the defined types were placed in this encounter.    Junious Silk, MD, [facility] [phone] office [phone] pager

## 2014-12-31 NOTE — Patient Instructions (Signed)
Remote monitoring is used to monitor your Pacemaker from home. This monitoring reduces the number of office visits required to check your device to one time per year. It allows [place] to monitor the functioning of your device to ensure it is working properly. You are scheduled for a device check from home on Apr 01, 2015. You may send your transmission at any time that day. If you have a wireless device, the transmission will be sent automatically. After your physician reviews your transmission, you will receive a postcard with your next transmission date.   Dr. Royann Shivers recommends that you schedule a follow-up appointment in: One year.

## 2015-01-02 ENCOUNTER — Ambulatory Visit
Admission: RE | Admit: 2015-01-02 | Discharge: 2015-01-02 | Disposition: A | Discharge status: Home / Self Care | Payer: Medicare HMO | Source: Ambulatory Visit | Attending: Family Medicine | Admitting: Family Medicine

## 2015-01-02 ENCOUNTER — Other Ambulatory Visit: Payer: Self-pay | Admitting: Cardiovascular Disease

## 2015-01-02 DIAGNOSIS — N644 Mastodynia: Secondary | ICD-10-CM

## 2015-01-02 NOTE — Telephone Encounter (Signed)
Rx(s) sent to pharmacy electronically.  

## 2015-01-10 ENCOUNTER — Encounter: Payer: Self-pay | Admitting: Cardiovascular Disease

## 2015-01-13 ENCOUNTER — Ambulatory Visit (INDEPENDENT_AMBULATORY_CARE_PROVIDER_SITE_OTHER): Payer: Medicare Other | Admitting: Internal Medicine

## 2015-01-21 ENCOUNTER — Encounter (HOSPITAL_COMMUNITY): Payer: Self-pay | Admitting: Emergency Medicine

## 2015-01-21 ENCOUNTER — Emergency Department (HOSPITAL_COMMUNITY)
Admission: EM | Admit: 2015-01-21 | Discharge: 2015-01-22 | Disposition: A | Discharge status: Home / Self Care | Payer: Medicare HMO | Attending: Emergency Medicine | Admitting: Emergency Medicine

## 2015-01-21 ENCOUNTER — Emergency Department (HOSPITAL_COMMUNITY): Payer: Medicare HMO

## 2015-01-21 DIAGNOSIS — Z79899 Other long term (current) drug therapy: Secondary | ICD-10-CM | POA: Insufficient documentation

## 2015-01-21 DIAGNOSIS — I25119 Atherosclerotic heart disease of native coronary artery with unspecified angina pectoris: Secondary | ICD-10-CM | POA: Insufficient documentation

## 2015-01-21 DIAGNOSIS — Z950 Presence of cardiac pacemaker: Secondary | ICD-10-CM | POA: Insufficient documentation

## 2015-01-21 DIAGNOSIS — E079 Disorder of thyroid, unspecified: Secondary | ICD-10-CM | POA: Diagnosis not present

## 2015-01-21 DIAGNOSIS — R079 Chest pain, unspecified: Secondary | ICD-10-CM | POA: Diagnosis present

## 2015-01-21 DIAGNOSIS — F419 Anxiety disorder, unspecified: Secondary | ICD-10-CM | POA: Diagnosis not present

## 2015-01-21 DIAGNOSIS — I4891 Unspecified atrial fibrillation: Secondary | ICD-10-CM | POA: Diagnosis not present

## 2015-01-21 DIAGNOSIS — R011 Cardiac murmur, unspecified: Secondary | ICD-10-CM | POA: Diagnosis not present

## 2015-01-21 DIAGNOSIS — Z8739 Personal history of other diseases of the musculoskeletal system and connective tissue: Secondary | ICD-10-CM | POA: Insufficient documentation

## 2015-01-21 DIAGNOSIS — Z9889 Other specified postprocedural states: Secondary | ICD-10-CM | POA: Insufficient documentation

## 2015-01-21 DIAGNOSIS — Z8619 Personal history of other infectious and parasitic diseases: Secondary | ICD-10-CM | POA: Diagnosis not present

## 2015-01-21 DIAGNOSIS — I10 Essential (primary) hypertension: Secondary | ICD-10-CM | POA: Insufficient documentation

## 2015-01-21 DIAGNOSIS — I209 Angina pectoris, unspecified: Secondary | ICD-10-CM

## 2015-01-21 DIAGNOSIS — Z7901 Long term (current) use of anticoagulants: Secondary | ICD-10-CM | POA: Insufficient documentation

## 2015-01-21 DIAGNOSIS — Z8673 Personal history of transient ischemic attack (TIA), and cerebral infarction without residual deficits: Secondary | ICD-10-CM | POA: Insufficient documentation

## 2015-01-21 DIAGNOSIS — Z880 Allergy status to penicillin: Secondary | ICD-10-CM | POA: Diagnosis not present

## 2015-01-21 LAB — CBC
HCT: 43.8 % (ref 36.0–46.0)
Hemoglobin: 14.1 g/dL (ref 12.0–15.0)
MCH: 27.8 pg (ref 26.0–34.0)
MCHC: 32.2 g/dL (ref 30.0–36.0)
MCV: 86.2 fL (ref 78.0–100.0)
PLATELETS: 196 10*3/uL (ref 150–400)
RBC: 5.08 MIL/uL (ref 3.87–5.11)
RDW: 14.5 % (ref 11.5–15.5)
WBC: 7.6 10*3/uL (ref 4.0–10.5)

## 2015-01-21 LAB — BASIC METABOLIC PANEL
ANION GAP: 8 (ref 5–15)
BUN: 19 mg/dL (ref 6–23)
CO2: 29 mmol/L (ref 19–32)
CREATININE: 0.62 mg/dL (ref 0.50–1.10)
Calcium: 9 mg/dL (ref 8.4–10.5)
Chloride: 101 mmol/L (ref 96–112)
GFR calc Af Amer: >90 mL/min (ref >90)
GFR calc non Af Amer: 82 mL/min — ABNORMAL LOW (ref >90)
Glucose, Bld: 126 mg/dL — ABNORMAL HIGH (ref 70–99)
Potassium: 3.5 mmol/L (ref 3.5–5.1)
Sodium: 138 mmol/L (ref 135–145)

## 2015-01-21 LAB — I-STAT TROPONIN, ED: Troponin i, poc: 0 ng/mL (ref 0.00–0.08)

## 2015-01-21 LAB — BRAIN NATRIURETIC PEPTIDE: B NATRIURETIC PEPTIDE 5: 47.8 pg/mL (ref 0.0–100.0)

## 2015-01-21 LAB — PROTIME-INR
INR: 2.77 — ABNORMAL HIGH (ref 0.00–1.49)
PROTHROMBIN TIME: 29.5 s — AB (ref 11.6–15.2)

## 2015-01-21 NOTE — ED Provider Notes (Signed)
CSN: [id number]     Arrival date & time 01/21/15  2218 History  This chart was scribed for Leslie Kaplan, MD by Luisa Dago, ED Scribe. This patient was seen in room A13C/A13C and the patient's care was started at 11:12 PM.    Chief Complaint  Patient presents with  . Chest Pain   The history is provided by the patient and medical records. No language interpreter was used.   HPI Comments: [name] is a 81 y.o. female with PMhx of MIk, afib, DM, and HTN presents to the Emergency Department complaining of sudden onset gradually worsening left sided chest pain that started approximately 10:30 PM while at rest. Pt states that at the time she was talking on the phone with a relative that informed her of the death of a family member. She states the news was upsetting, but is not sure that it caused her chest pain. Pt describes her chest pain as "sharp" in nature. She also endorses associated SOB but no diaphoresis. Currently pt admits to being pain free.  She denies any fever, neck pain, sore throat, visual disturbance, CP, cough, SOB, abdominal pain, nausea, emesis, diarrhea, urinary symptoms, back pain, HA, weakness, numbness and rash as associated symptoms.     Past Medical History  Diagnosis Date  . A-fib   . Stroke   . Pacemaker   . Arthritis   . Diabetes mellitus     x 10 yrs at least  . Anxiety   . Polio   . Hx of echocardiogram 09/2012    showed Ef 35-40% with no significant valve disease except for mild to moderate MR. normal RVSP and left atrium was severly dilated.  Marland Kitchen History of stress test 08/2011    negative for ischemia it was nongated and nondiagnostic and compatible with a possible scar or peri-infraction ischemia.   . Coronary artery disease 12/2007  . Hypertension   . Thyroid disease     hypothyroidism   Past Surgical History  Procedure Laterality Date  . Cardiac surgery    . 3 cardiac stents      she has had non-DES stent to the mid circumflex in 2009 and LAD  stent to the mid-distal LAD  BMS June 18, 2010  . Abdominal hysterectomy  1978  . Tonsillectomy    . Appendectomy    . Pacemaker placement  08/11/2010    Medtronic Adapta   . Coronary angioplasty    . Left heart catheterization with coronary angiogram N/A 05/10/2013    Procedure: LEFT HEART CATHETERIZATION WITH CORONARY ANGIOGRAM;  Surgeon: Lennette Bihari, MD;  Location: [facility] CATH LAB;  Service: Cardiovascular;  Laterality: N/A;  . Transthoracic echocardiogram  01/17/2009    EF=>55%.IMPAIRED LV RELAXATION.LA IS MODERATELY DILATED.RA IS MILDLY DILATED. RV SYSTOLIC PRESSURE IS ELEVATED AT 30-40MMHG.MILD TO MODERATE TR.MILD MR.SINCE PRIOR TTE,BOTH ATRIA APPEAR TO BE MORE DILATED.  . Stress perfusion test  10/22/2009    MILD ISCHEMIA IN THE BASAL ANTEROLATERAL AND MID ANTEROLATERAL REGIONS.EF 52%.   Family History  Problem Relation Age of Onset  . Heart disease Father   . Cancer Mother    History  Substance Use Topics  . Smoking status: Never Smoker   . Smokeless tobacco: Never Used  . Alcohol Use: No   OB History    No data available     Review of Systems  Constitutional: Negative for fever and fatigue.  HENT: Negative for congestion and drooling.   Eyes: Negative for pain.  Respiratory: Positive for shortness of breath. Negative for cough.   Cardiovascular: Positive for chest pain.  Gastrointestinal: Negative for nausea, vomiting, abdominal pain and diarrhea.  Genitourinary: Negative for dysuria and hematuria.  Musculoskeletal: Negative for neck pain.  Skin: Negative for color change.  Neurological: Negative for dizziness and headaches.  Hematological: Negative for adenopathy.  Psychiatric/Behavioral: Negative for behavioral problems.   Allergies  Iohexol; Penicillins; and Sulfur  Home Medications   Prior to Admission medications   Medication Sig Start Date End Date Taking? Authorizing Provider  ALPRAZolam (XANAX) 0.25 MG tablet Take 0.25 mg by mouth 2 (two) times daily as  needed for anxiety.    Historical Provider, MD  dicyclomine (BENTYL) 10 MG capsule Take 1 capsule (10 mg total) by mouth daily. 07/01/14   Malissa Hippo, MD  digoxin (LANOXIN) 0.125 MG tablet Take 1 tablet (0.125 mg total) by mouth daily. 12/30/14   Lennette Bihari, MD  hydrochlorothiazide (MICROZIDE) 12.5 MG capsule Take 1 capsule (12.5 mg total) by mouth daily. 05/10/14   Lennette Bihari, MD  isosorbide mononitrate (IMDUR) 30 MG 24 hr tablet TAKE 1 TABLET BY MOUTH DAILY 01/02/15   Lennette Bihari, MD  levothyroxine (SYNTHROID, LEVOTHROID) 100 MCG tablet Take 100 mcg by mouth daily.    Historical Provider, MD  metoprolol (LOPRESSOR) 50 MG tablet TAKE 1 TABLET (50 MG TOTAL) BY MOUTH 2 (TWO) TIMES DAILY. 12/02/14   Lennette Bihari, MD  traMADol (ULTRAM) 50 MG tablet Take 25-50 mg by mouth every 6 (six) hours as needed for pain.     Historical Provider, MD  warfarin (COUMADIN) 2.5 MG tablet TAKE 1 TABLET DAILY OR AS DIRECTED BY COUMADIN CLINIC 11/04/14   Lennette Bihari, MD   BP 144/63 mmHg  Pulse 73  Temp(Src) 97.4 F (36.3 C)  Resp 18  SpO2 96%  Physical Exam  Constitutional: She is oriented to person, place, and time. She appears well-developed and well-nourished. No distress.  HENT:  Head: Normocephalic and atraumatic.  Eyes: Conjunctivae and EOM are normal.  Neck: Neck supple. No JVD present.  Cardiovascular: Normal rate and regular rhythm.   Murmur heard.  Systolic murmur is present  Pulses:      Radial pulses are 2+ on the right side, and 2+ on the left side.  Pulmonary/Chest: Effort normal and breath sounds normal. No respiratory distress. She has no wheezes. She has no rales.  Abdominal: Soft. Bowel sounds are normal. She exhibits no distension. There is no tenderness.  Musculoskeletal: Normal range of motion.  No pitting edema. No unilateral swelling or redness.  Neurological: She is alert and oriented to person, place, and time.  Skin: Skin is warm and dry.  Psychiatric: She has a  normal mood and affect. Her behavior is normal.  Nursing note and vitals reviewed.   ED Course  Procedures (including critical care time)  DIAGNOSTIC STUDIES: Oxygen Saturation is 96% on RA, adequate by my interpretation.    COORDINATION OF CARE: 11:19 PM- Pt advised of plan for treatment and pt agrees.  Labs Review Labs Reviewed  CBC  BASIC METABOLIC PANEL  BRAIN NATRIURETIC PEPTIDE  PROTIME-INR  Rosezena Sensor, ED    Imaging Review Dg Chest 2 View  01/21/2015   CLINICAL DATA:  Acute chest pain shortness of breath since earlier today.  EXAM: CHEST  2 VIEW  COMPARISON:  12/21/2014  FINDINGS: Single lead left subclavian pacemaker noted. Stable cardiomegaly without CHF or edema. Mild hyperinflation evident without focal pneumonia,  collapse or consolidation. No effusion or pneumothorax. Azygos fissure present in the right upper lobe. Trachea is midline. Degenerative changes noted of the spine.  IMPRESSION: Stable chest exam.  No superimposed acute process   Electronically Signed   By: Judie Petit.  Shick M.D.   On: 01/21/2015 22:58     EKG Interpretation   Date/Time:  Tuesday January 21 2015 22:22:26 EST Ventricular Rate:  63 PR Interval:    QRS Duration: 110 QT Interval:  408 QTC Calculation: 417 R Axis:   121 Text Interpretation:  Atrial fibrillation with frequent ventricular-paced  complexes Right axis deviation Incomplete right bundle branch block  Abnormal ECG No significant change since Confirmed by Taylin Leder, MD, Janey Genta  [phone]) on 01/21/2015 11:05:30 PM      CATH: 04/2013 ANGIOGRAPHY:  The left main coronary artery bifurcated into the LAD and left circumflex vessel and was free of significant disease.  The LAD had mild smooth narrowing of 30% prior to an ectatic proximal segment. The first diagonal vessel had 40% ostial narrowing. The mid LAD stent was widely patent. The distal LAD was small caliber and free of significant disease.  The left circumflex coronary artery had  20% ostial narrowing and in this region there was a very small marginal vessel with a smooth narrowing ostially of 60-70%. The stent in the circumflex vessel was patent and have smooth 20 to less than 30% intimal hyperplasia.  The right current artery was a large dominant vessel that had 20% areas of irregularity in its proximal to mid segment. The vessel supplied a large PDA and PLA system was applied the inferior wall almost towards the apex.  RAO left ventriculography revealed an ejection fraction of approximately 55%. There were no focal segmental wall motion abnormalities. There was evidence for angiographic mitral valve prolapse with 1+ mitral regurgitation.   MDM   Final diagnoses:  None    I personally performed the services described in this documentation, which was scribed in my presence. The recorded information has been reviewed and is accurate.  Pt comes in with cc of chest pain. She has hx of CAD, cath results as above. Pt has been having vague, atypical chest pain few weeks ago, and wa seen by Cards in the ER. Pt was reassured after 2 trops. Today, the pain is a bit different. More localized to Left chest, and sharp, and was precipitated by stressful phone call. Pain lasted for few minutes, but now chest pain free.   Initial cardiac workup is normal. Spoke with Dr. Shirlee Latch, to see if they feel patient should stay for provocative testing, or get outpatient f/u. Dr. Shirlee Latch reviewed precious stress and cath from 2014 and advises serial trops and discharge for outpatient stress if pain free - which i think is a good plan. We will also ambulate pt to see if that has any impact on her pain.   Leslie Kaplan, MD 01/22/15 [phone]

## 2015-01-21 NOTE — ED Notes (Signed)
Report left sided CP onset tonight, pain is reproducible, also reports SOB, no other complaints, A/O X4, NAD

## 2015-01-21 NOTE — ED Notes (Signed)
Patient transported to X-ray 

## 2015-01-21 NOTE — ED Notes (Signed)
Called Shelia in main lab to add on dig level.

## 2015-01-21 NOTE — ED Notes (Signed)
Dr. Nanavanti at the bedside.  

## 2015-01-22 LAB — I-STAT TROPONIN, ED: Troponin i, poc: 0 ng/mL (ref 0.00–0.08)

## 2015-01-22 LAB — DIGOXIN LEVEL: DIGOXIN LVL: 0.3 ng/mL — AB (ref 0.8–2.0)

## 2015-01-22 NOTE — Discharge Instructions (Signed)
We saw you in the ER for the chest pain/shortness of breath. All of our cardiac workup is normal, including labs, EKG and chest X-RAY are normal. We are not sure what is causing your discomfort, but we feel comfortable sending you home at this time. The workup in the ER is not complete, and you should follow up with your CARDIOLOGIST AS SOON AS POSSIBLE.  Return to the ER if the symptoms get worse.  Angina Pectoris Angina pectoris, often just called angina, is extreme discomfort in your chest, neck, or arm caused by a lack of blood in the middle and thickest layer of your heart wall (myocardium). It may feel like tightness or heavy pressure. It may feel like a crushing or squeezing pain. Some people say it feels like gas or indigestion. It may go down your shoulders, back, and arms. Some people may have symptoms other than pain. These symptoms include fatigue, shortness of breath, cold sweats, or nausea. There are four different types of angina:  Stable angina--Stable angina usually occurs in episodes of predictable frequency and duration. It usually is brought on by physical activity, emotional stress, or excitement. These are all times when the myocardium needs more oxygen. Stable angina usually lasts a few minutes and often is relieved by taking a medicine that can be taken under your tongue (sublingually). The medicine is called nitroglycerin. Stable angina is caused by a buildup of plaque inside the arteries, which restricts blood flow to the heart muscle (atherosclerosis).  Unstable angina--Unstable angina can occur even when your body experiences little or no physical exertion. It can occur during sleep. It can also occur at rest. It can suddenly increase in severity or frequency. It might not be relieved by sublingual nitroglycerin. It can last up to 30 minutes. The most common cause of unstable angina is a blood clot that has developed on the top of plaque buildup inside a coronary artery. It  can lead to a heart attack if the blood clot completely blocks the artery.  Microvascular angina--This type of angina is caused by a disorder of tiny blood vessels called arterioles. Microvascular angina is more common in women. The pain may be more severe and last longer than other types of angina pectoris.  Prinzmetal or variant angina--This type of angina pectoris usually occurs when your body experiences little or no physical exertion. It especially occurs in the early morning hours. It is caused by a spasm of your coronary artery. HOME CARE INSTRUCTIONS   Only take over-the-counter and prescription medicines as directed by your health care provider.  Stay active or increase your exercise as directed by your health care provider.  Limit strenuous activity as directed by your health care provider.  Limit heavy lifting as directed by your health care provider.  Maintain a healthy weight.  Learn about and eat heart-healthy foods.  Do not use any tobacco products including cigarettes, chewing tobacco or electronic cigarettes. SEEK IMMEDIATE MEDICAL CARE IF:  You experience the following symptoms:  Chest, neck, deep shoulder, or arm pain or discomfort that lasts more than a few minutes.  Chest, neck, deep shoulder, or arm pain or discomfort that goes away and comes back, repeatedly.  Heavy sweating with discomfort, without a noticeable cause.  Shortness of breath or difficulty breathing.  Angina that does not get better after a few minutes of rest or after taking sublingual nitroglycerin. These can all be symptoms of a heart attack, which is a medical emergency! Get medical help at  once. Call your local emergency service (911 in [place] immediately. Do not  drive yourself to the hospital and do not  wait to for your symptoms to go away. MAKE SURE YOU:  Understand these instructions.  Will watch your condition.  Will get help right away if you are not doing well or get  worse. Document Released: 11/15/2005 Document Revised: 11/20/2013 Document Reviewed: 03/19/2014 [facility] Patient Information 2015 [place], [place]. This information is not intended to replace advice given to you by your health care provider. Make sure you discuss any questions you have with your health care provider.

## 2015-01-22 NOTE — ED Notes (Signed)
Patient ambulated in hallway with RN supervision. O2 sat reading above 95%, and heart rate 70s. No complaints of chest pain, dizziness, or shortness of breath. Steady gait noted. Reported to Dr. Shyrl Numbers.

## 2015-01-24 ENCOUNTER — Telehealth: Payer: Self-pay | Admitting: Cardiovascular Disease

## 2015-01-24 NOTE — Telephone Encounter (Signed)
Pt was seen in the ED. In the ED providers note he mentions having an outpatient "stress". I don't see where anything was ordered or a follow up was done. Will forward information to Dr. Tresa Endo to see in he has any recommendations.

## 2015-01-24 NOTE — Telephone Encounter (Signed)
Calling about a test that ER said they were going to notify Dr. Tresa Endo about for Mrs. Codd. Went to ED on 01/21/15. Please call    Thanks

## 2015-01-27 NOTE — Telephone Encounter (Signed)
Pt's husband called in stating that since her return from the ER she is still having some bad chest pain and she was instructed to follow up with Dr. Tresa Endo in one week. Please f/u  Thanks

## 2015-01-29 NOTE — Telephone Encounter (Signed)
Arrange f/u with APP

## 2015-01-29 NOTE — Telephone Encounter (Signed)
Message sent to schedulers for patient to see an extender soon.

## 2015-01-30 NOTE — Telephone Encounter (Signed)
Pt has appointment scheduled for 3/8 with [place], [place].

## 2015-02-04 ENCOUNTER — Ambulatory Visit (INDEPENDENT_AMBULATORY_CARE_PROVIDER_SITE_OTHER): Payer: Medicare HMO | Admitting: Internal Medicine

## 2015-02-04 ENCOUNTER — Encounter: Payer: Self-pay | Admitting: Cardiology

## 2015-02-04 ENCOUNTER — Ambulatory Visit (INDEPENDENT_AMBULATORY_CARE_PROVIDER_SITE_OTHER): Payer: Medicare HMO | Admitting: Cardiology

## 2015-02-04 ENCOUNTER — Encounter (INDEPENDENT_AMBULATORY_CARE_PROVIDER_SITE_OTHER): Payer: Self-pay | Admitting: Internal Medicine

## 2015-02-04 VITALS — BP 131/75 | HR 59 | Ht 69.0 in | Wt 164.7 lb

## 2015-02-04 VITALS — BP 118/72 | HR 76 | Temp 97.4°F | Resp 18 | Ht 69.0 in | Wt 164.0 lb

## 2015-02-04 DIAGNOSIS — R0789 Other chest pain: Secondary | ICD-10-CM

## 2015-02-04 DIAGNOSIS — Z7901 Long term (current) use of anticoagulants: Secondary | ICD-10-CM

## 2015-02-04 DIAGNOSIS — I2510 Atherosclerotic heart disease of native coronary artery without angina pectoris: Secondary | ICD-10-CM

## 2015-02-04 DIAGNOSIS — R079 Chest pain, unspecified: Secondary | ICD-10-CM | POA: Insufficient documentation

## 2015-02-04 DIAGNOSIS — I495 Sick sinus syndrome: Secondary | ICD-10-CM

## 2015-02-04 DIAGNOSIS — K589 Irritable bowel syndrome without diarrhea: Secondary | ICD-10-CM

## 2015-02-04 DIAGNOSIS — I482 Chronic atrial fibrillation, unspecified: Secondary | ICD-10-CM

## 2015-02-04 MED ORDER — DICYCLOMINE HCL 10 MG PO CAPS: 10.0000 mg | ORAL_CAPSULE | Freq: Every day | ORAL | Status: DC

## 2015-02-04 MED ORDER — RIFAXIMIN 550 MG PO TABS: 550.0000 mg | ORAL_TABLET | Freq: Three times a day (TID) | ORAL | Status: DC

## 2015-02-04 NOTE — Assessment & Plan Note (Signed)
Followed by Dr. Croitoru 

## 2015-02-04 NOTE — Patient Instructions (Signed)
Your physician has requested that you have a lexiscan myoview. For further information please visit [email or web address] Please follow instruction sheet, as given.  Your physician recommends that you schedule a follow-up appointment in: 3 months with Dr. Tresa Endo.

## 2015-02-04 NOTE — Assessment & Plan Note (Signed)
Coumadin Rx 

## 2015-02-04 NOTE — Assessment & Plan Note (Signed)
Last cath done after mild abnormality noted on Myoview

## 2015-02-04 NOTE — Progress Notes (Signed)
02/04/2015 [name]   1930-03-15  [id number]  Primary Physician No PCP Per Patient Primary Cardiologist: Dr Tresa Endo  HPI:  The patient is a thin, 81 y/o female followed by Dr Tresa Endo who has known coronary disease with non-DES stenting of the mid circumflex in 2009 and mid-LAD BMS stent in 2011. She had a negative Myoview for ischemia in October of 2012 and a cath in June 2014 showing a widely patent LAD stent. She has CAF and has a Medtronic Adapta SR implanted August 11, 2010. She is on chronic Coumadin. She has had previous cardiomyopathy, not felt to be secondary to CAD. Her most recent EF was 55% at cath June 2014. This last cath was done for complaints of chest pain and a mild abnormality on Myoview.             She was recently in the ED with chest pain. She complains of pain under her Lt breast. It does not sound like typical angina but she tells me her symptoms have always been atypical. She did admit that the pain came on while she was on the phone receiving news of a death in the family and she was upset. She has not had recurrent chest pain.   Current Outpatient Prescriptions  Medication Sig Dispense Refill  . ALPRAZolam (XANAX) 0.25 MG tablet Take 0.25 mg by mouth at bedtime.     . dicyclomine (BENTYL) 10 MG capsule Take 1 capsule (10 mg total) by mouth daily. 60 capsule 5  . digoxin (LANOXIN) 0.125 MG tablet Take 1 tablet (0.125 mg total) by mouth daily. 30 tablet 6  . hydrochlorothiazide (MICROZIDE) 12.5 MG capsule Take 1 capsule (12.5 mg total) by mouth daily. 90 capsule 3  . isosorbide mononitrate (IMDUR) 30 MG 24 hr tablet TAKE 1 TABLET BY MOUTH DAILY 30 tablet 11  . levothyroxine (SYNTHROID, LEVOTHROID) 100 MCG tablet Take 100 mcg by mouth daily.    Marland Kitchen MELATONIN PO Take 2 tablets by mouth 2 (two) times daily.    . metoprolol (LOPRESSOR) 50 MG tablet TAKE 1 TABLET (50 MG TOTAL) BY MOUTH 2 (TWO) TIMES DAILY. 60 tablet 11  . traMADol (ULTRAM) 50 MG tablet Take 25-50 mg by  mouth every 6 (six) hours as needed for pain.     Marland Kitchen warfarin (COUMADIN) 2.5 MG tablet TAKE 1 TABLET DAILY OR AS DIRECTED BY COUMADIN CLINIC (Patient taking differently: TAKE 1 WHOLE ON SATURDAY, SUNDAY, TUESDAY AND THURSDAY. TAKE 0.5 TABLET ON MONDAY, WEDNESDAY AND FRIDAY.) 30 tablet 5   No current facility-administered medications for this visit.    Allergies  Allergen Reactions  . Iohexol Other (See Comments)     Desc: CHEST PAIN, LOC AFTER HEART CATH, PT REFUSED DYE   . Penicillins Other (See Comments)    REACTION:  unknown  . Sulfur Other (See Comments)    REACTION:  unknown    History   Social History  . Marital Status: Married    Spouse Name: N/A  . Number of Children: N/A  . Years of Education: N/A   Occupational History  . Not on file.   Social History Main Topics  . Smoking status: Never Smoker   . Smokeless tobacco: Never Used  . Alcohol Use: No  . Drug Use: No  . Sexual Activity: Not on file   Other Topics Concern  . Not on file   Social History Narrative     Review of Systems: General: negative for chills, fever, night  sweats or weight changes.  Cardiovascular: negative for chest pain, dyspnea on exertion, edema, orthopnea, palpitations, paroxysmal nocturnal dyspnea or shortness of breath Dermatological: negative for rash Respiratory: negative for cough or wheezing Urologic: negative for hematuria Abdominal: negative for nausea, vomiting, diarrhea, bright red blood per rectum, melena, or hematemesis Neurologic: negative for visual changes, syncope, or dizziness She admits to memory problems All other systems reviewed and are otherwise negative except as noted above.    Blood pressure 131/75, pulse 59, height 5\' 9"  (1.753 m), weight 164 lb 11.2 oz (74.707 kg).  General appearance: alert, cooperative, no distress and thin Neck: no carotid bruit and no JVD Lungs: clear to auscultation bilaterally Heart: regular rate and rhythm Extremities: no  edema Neurologic: Grossly normal  EKG AF paced  ASSESSMENT AND PLAN:   Chest pain Seen in ED 01/21/15- atypical pain but she has previously had atypical presentations   CAD, non DES to CFX '09, LAD BMS 7/11. Cath OK June 2014 Last cath done after mild abnormality noted on Myoview   Chronic atrial fibrillation AF on EKG today   Long term current use of anticoagulant therapy Coumadin Rx   SSS (sick sinus syndrome)- MDT PTVDP 9/11 Followed by Dr Royann Shivers    PLAN  I ordered a Myoview, as long as there in no significant new abnormality I think we can reassure her.   [name] KPA-C 02/04/2015 11:43 AM

## 2015-02-04 NOTE — Assessment & Plan Note (Signed)
Seen in ED 01/21/15- atypical pain but she has previously had atypical presentations

## 2015-02-04 NOTE — Patient Instructions (Signed)
INR will need to be checked on day 3 or 4 while on Xifaxan. Call with progress report when you finish Xifaxan or if you experience side effects and cannot take this medication

## 2015-02-04 NOTE — Progress Notes (Signed)
Presenting complaint;  Lower abdominal pain diarrhea and urgency.  Subjective:  Patient is an 81 year old Caucasian female with multiple medical problems who presents for scheduled visit. She is accompanied by her husband Dorinda Hill. She was last seen on 09/09/2014 and was doing well. He has history of diverticulitis as well as IBS. She states her symptoms are back. Change occurred 1 month ago. She has intermittent pain across lower abdomen urgency and diarrhea. She already has been to the bathroom 3 times today. She gets relief of pain with bowel movements. She denies melena or rectal bleeding fever chills nausea or vomiting. She has not lost any weight since her last visit but she states about 2 months ago she had lost 10 pounds but all of her discomfort back and she doesn't understand why. She says she has not changed her eating habits and did not start a new medication. She has not taken any antibiotic since her last visit and she has not traveled outside the country. She states she's been under a lot of stress. Her husband's current and he had to buy a new one. She also had problem with her refrigerator and dishwasher and her son who lives in a trailer behind her house gets on her nerves and she is not able to tell him to stop talking(which he does too much off). She takes dicyclomine while she is eating her breakfast. She is not having any side effects with this medication.   Current Medications: Outpatient Encounter Prescriptions as of 02/04/2015  Medication Sig  . ALPRAZolam (XANAX) 0.25 MG tablet Take 0.25 mg by mouth at bedtime.   . dicyclomine (BENTYL) 10 MG capsule Take 1 capsule (10 mg total) by mouth daily.  . digoxin (LANOXIN) 0.125 MG tablet Take 1 tablet (0.125 mg total) by mouth daily.  . hydrochlorothiazide (MICROZIDE) 12.5 MG capsule Take 1 capsule (12.5 mg total) by mouth daily.  . isosorbide mononitrate (IMDUR) 30 MG 24 hr tablet TAKE 1 TABLET BY MOUTH DAILY  . levothyroxine  (SYNTHROID, LEVOTHROID) 100 MCG tablet Take 100 mcg by mouth daily.  Marland Kitchen MELATONIN PO Take 2 tablets by mouth 2 (two) times daily.  . metoprolol (LOPRESSOR) 50 MG tablet TAKE 1 TABLET (50 MG TOTAL) BY MOUTH 2 (TWO) TIMES DAILY.  . traMADol (ULTRAM) 50 MG tablet Take 25-50 mg by mouth every 6 (six) hours as needed for pain.   Marland Kitchen warfarin (COUMADIN) 2.5 MG tablet TAKE 1 TABLET DAILY OR AS DIRECTED BY COUMADIN CLINIC (Patient taking differently: TAKE 1 WHOLE ON SATURDAY, SUNDAY, TUESDAY AND THURSDAY. TAKE 0.5 TABLET ON MONDAY, WEDNESDAY AND FRIDAY.)     Objective: Blood pressure 118/72, pulse 76, temperature 97.4 F (36.3 C), temperature source Oral, resp. rate 18, height 5\' 9"  (1.753 m), weight 164 lb (74.39 kg). Patient is alert and in no acute distress. Conjunctiva is pink. Sclera is nonicteric Oropharyngeal mucosa is normal. No neck masses or thyromegaly noted. Cardiac exam with irregular rhythm normal S1 and S2. No murmur or gallop noted. Lungs are clear to auscultation. Abdomen is symmetrical. Bowel sounds are hyperactive. On palpation abdomen is soft with mild tenderness in LLQ on deep palpation but no guarding or rebound. No organomegaly or masses. No LE edema or clubbing noted.  Labs/studies Results: Lab data from 01/21/2015  WBC 7.6, H&H is 14.1 and 43.8 and platelet count 196K .   Assessment:  #1. Irritable bowel syndrome. Ongoing symptoms are typical of IBS most likely secondary to stressful situation. She is not a candidate  for higher dose of anti-spasmodic. She might benefit from xifaxan if she is able to tolerated.  Plan:  Xifaxan 550 mg by mouth 3 times a day for 2 weeks. Patient will have INR after she has been on Xifaxan for 3 or 4 days. Patient advised to call office with progress report when she finishes Xifaxan. Office visit in 3 months.

## 2015-02-04 NOTE — Assessment & Plan Note (Signed)
AF on EKG today

## 2015-02-06 ENCOUNTER — Telehealth (INDEPENDENT_AMBULATORY_CARE_PROVIDER_SITE_OTHER): Payer: Self-pay | Admitting: *Deleted

## 2015-02-06 NOTE — Telephone Encounter (Signed)
The patient's husband called and states that the Xifaxan was very high and that they would be unable to buy it. I called patient's insurance spoke with Shatayh. A PA was done over the phone and she was approved through 11/29/15. The estimated cost was $379.00 Pharmacy was called. [facility] in [place]. They were unable to process it at that time , they will let the patient know the cost.  I am calling the patient's husband with this information.

## 2015-02-07 ENCOUNTER — Encounter (HOSPITAL_COMMUNITY): Payer: Self-pay | Admitting: *Deleted

## 2015-02-10 ENCOUNTER — Telehealth (INDEPENDENT_AMBULATORY_CARE_PROVIDER_SITE_OTHER): Payer: Self-pay | Admitting: *Deleted

## 2015-02-10 ENCOUNTER — Ambulatory Visit (INDEPENDENT_AMBULATORY_CARE_PROVIDER_SITE_OTHER): Payer: Medicare HMO | Admitting: *Deleted

## 2015-02-10 DIAGNOSIS — R197 Diarrhea, unspecified: Secondary | ICD-10-CM

## 2015-02-10 DIAGNOSIS — I4891 Unspecified atrial fibrillation: Secondary | ICD-10-CM

## 2015-02-10 DIAGNOSIS — Z7901 Long term (current) use of anticoagulants: Secondary | ICD-10-CM

## 2015-02-10 DIAGNOSIS — I482 Chronic atrial fibrillation, unspecified: Secondary | ICD-10-CM

## 2015-02-10 DIAGNOSIS — Z5181 Encounter for therapeutic drug level monitoring: Secondary | ICD-10-CM

## 2015-02-10 LAB — POCT INR: INR: 2.5

## 2015-02-10 NOTE — Telephone Encounter (Signed)
Patient's husband presented to office and questioned about the Xifaxan. A PA was completed but the cost is still ery expensive for the patient. A call to the Xifaxan Representative,Brent , was made and a message was left asking for assistance with the medication. Husband also ask about the Pepto Bismuth,states that his wife his taking a lot of it. Per Dr.Rehman she should not take any,she is to take Imodium OTC 1 by mouth 3 times a day before each meal. He also ask that we order a C-Diff, Culture,O&P. I will ask Dr.Rehman about the new question and call the patient with his recommendation.

## 2015-02-10 NOTE — Telephone Encounter (Signed)
The question he had on the medication he said was there something cheaper.  She had a terrible weekend with diarrhea.  Also she take a medication for her anxiety only when she goes to bed.  The heart PA stopped this or at least this is what Mono thinks.   He wants Dr. Patty Sermons opinion on if she could take it or maybe 1/2 to help with sleep.  He said he was sorry not trying to be a bother just worried about her.  You can call him or he said I could call him back.

## 2015-02-10 NOTE — Telephone Encounter (Signed)
Patients husband called and made aware.

## 2015-02-10 NOTE — Telephone Encounter (Signed)
Per Dr.Rehman she should take 1/2 tablet of the Xanax. This may help with the diarrhea also.

## 2015-02-11 ENCOUNTER — Other Ambulatory Visit (INDEPENDENT_AMBULATORY_CARE_PROVIDER_SITE_OTHER): Payer: Self-pay | Admitting: Internal Medicine

## 2015-02-12 LAB — C. DIFFICILE GDH AND TOXIN A/B
C. difficile GDH: DETECTED — AB
C. difficile Toxin A/B: NOT DETECTED

## 2015-02-12 LAB — OVA AND PARASITE EXAMINATION: OP: NONE SEEN

## 2015-02-12 LAB — CLOSTRIDIUM DIFFICILE BY PCR: Toxigenic C. Difficile by PCR: DETECTED — CR

## 2015-02-13 ENCOUNTER — Encounter (INDEPENDENT_AMBULATORY_CARE_PROVIDER_SITE_OTHER): Payer: Self-pay | Admitting: *Deleted

## 2015-02-13 ENCOUNTER — Telehealth (INDEPENDENT_AMBULATORY_CARE_PROVIDER_SITE_OTHER): Payer: Self-pay | Admitting: *Deleted

## 2015-02-13 DIAGNOSIS — Z9229 Personal history of other drug therapy: Secondary | ICD-10-CM

## 2015-02-13 NOTE — Telephone Encounter (Signed)
Per Dr.Rehman the patient will need to have labs drawn on 02/17/15 after having started the medication , Vancomycin.

## 2015-02-15 LAB — STOOL CULTURE

## 2015-02-17 ENCOUNTER — Telehealth (INDEPENDENT_AMBULATORY_CARE_PROVIDER_SITE_OTHER): Payer: Self-pay | Admitting: *Deleted

## 2015-02-17 DIAGNOSIS — Z79899 Other long term (current) drug therapy: Secondary | ICD-10-CM

## 2015-02-17 NOTE — Telephone Encounter (Signed)
Per Dr.Rehman the patient will need to have labs drawn. 

## 2015-02-18 ENCOUNTER — Telehealth (HOSPITAL_COMMUNITY): Payer: Self-pay

## 2015-02-18 ENCOUNTER — Telehealth: Payer: Self-pay | Admitting: Cardiovascular Disease

## 2015-02-18 LAB — PROTIME-INR
INR: 2.58 — AB (ref <1.50)
Prothrombin Time: 27.7 seconds — ABNORMAL HIGH (ref 11.6–15.2)

## 2015-02-18 NOTE — Telephone Encounter (Signed)
Returned call to patient's husband.Advised it is recommended to have a lexiscan myoview.Stated he wanted to make sure since price has increased and they have to pay more.Stated wife will have done.

## 2015-02-18 NOTE — Telephone Encounter (Signed)
Encounter complete. 

## 2015-02-18 NOTE — Telephone Encounter (Signed)
Patient's husband calling about wife - she has nuc scheduled for Thursday.  He is worried about the costs and his medical expenses.  Wants to know the urgency of having this done.  If it's a routine test he may reconsider her having it done.Marland KitchenMarland Kitchen

## 2015-02-19 ENCOUNTER — Telehealth (INDEPENDENT_AMBULATORY_CARE_PROVIDER_SITE_OTHER): Payer: Self-pay | Admitting: *Deleted

## 2015-02-19 NOTE — Telephone Encounter (Signed)
[name] is wanting to know if C-DIFF is contagious? Her return phone number is [phone].

## 2015-02-20 ENCOUNTER — Encounter (HOSPITAL_COMMUNITY): Payer: Medicare HMO

## 2015-02-20 NOTE — Telephone Encounter (Signed)
Patient was called, spoke with her husband. The following was shared with him. Transmission of  is shed in feces. Any surface, device, or material  (e.g., toilets, bathing tubs, and electronic rectal thermometers) that becomes  contaminated with feces may serve as a reservoir for the Clostridium  difficile spores. Clostridium difficile spores are transferred to  patients mainly via the hands of healthcare personnel who have touched a  contaminated surface or item. Clostridium  difficile can live for long periods on surfaces. Good handwashing was stressed to Mr. Eckman.

## 2015-02-24 ENCOUNTER — Encounter (INDEPENDENT_AMBULATORY_CARE_PROVIDER_SITE_OTHER): Payer: Self-pay | Admitting: Internal Medicine

## 2015-02-24 ENCOUNTER — Ambulatory Visit (INDEPENDENT_AMBULATORY_CARE_PROVIDER_SITE_OTHER): Payer: Medicare HMO | Admitting: Internal Medicine

## 2015-02-24 VITALS — BP 120/72 | HR 68 | Temp 97.4°F | Resp 18 | Ht 69.0 in | Wt 165.2 lb

## 2015-02-24 DIAGNOSIS — A047 Enterocolitis due to Clostridium difficile: Secondary | ICD-10-CM

## 2015-02-24 DIAGNOSIS — R350 Frequency of micturition: Secondary | ICD-10-CM | POA: Diagnosis not present

## 2015-02-24 DIAGNOSIS — K589 Irritable bowel syndrome without diarrhea: Secondary | ICD-10-CM | POA: Diagnosis not present

## 2015-02-24 DIAGNOSIS — A0472 Enterocolitis due to Clostridium difficile, not specified as recurrent: Secondary | ICD-10-CM

## 2015-02-24 MED ORDER — DICYCLOMINE HCL 10 MG PO CAPS: ORAL_CAPSULE | ORAL | Status: DC

## 2015-02-24 NOTE — Progress Notes (Signed)
Presenting complaint;  Follow-up for lower abdominal pain and diarrhea.  Subjective:  Patient is a 81 year old Caucasian female who is here for scheduled visit. She is accompanied by her husband. She was last seen on 02/04/2015 for lower abdominal cramping and diarrhea. Symptoms are felt to be due to IBS but stool studies were obtained and C. difficile was positive. She was therefore not treated with Xifaxan as planned(for IBS) and treated with vancomycin instead. She has few more doses to go. She states she was doing great until 2 days ago when she developed lower abdominal cramps and urgency with diarrhea but she denies fever chills or rectal bleeding. Since she has been on vancomycin she has had few episodes when she had formed stool. She complains of being nervous and worries about getting colon cancer. He also complains of increased urinary frequency and urgency but denies dysuria or hematuria and wants to be checked for UTI. There is no history of antibiotic use in the last 3 months per her pharmacist at [facility].   Current Medications: Outpatient Encounter Prescriptions as of 02/24/2015  Medication Sig  . ALPRAZolam (XANAX) 0.25 MG tablet Take 0.25 mg by mouth at bedtime.   . dicyclomine (BENTYL) 10 MG capsule Take 1 capsule (10 mg total) by mouth daily.  . digoxin (LANOXIN) 0.125 MG tablet Take 1 tablet (0.125 mg total) by mouth daily.  . hydrochlorothiazide (MICROZIDE) 12.5 MG capsule Take 1 capsule (12.5 mg total) by mouth daily.  . isosorbide mononitrate (IMDUR) 30 MG 24 hr tablet TAKE 1 TABLET BY MOUTH DAILY  . levothyroxine (SYNTHROID, LEVOTHROID) 100 MCG tablet Take 100 mcg by mouth daily.  Marland Kitchen loperamide (IMODIUM) 2 MG capsule Take by mouth as needed for diarrhea or loose stools.  Marland Kitchen MELATONIN PO Take 1 tablet by mouth 2 (two) times daily.   . metoprolol (LOPRESSOR) 50 MG tablet TAKE 1 TABLET (50 MG TOTAL) BY MOUTH 2 (TWO) TIMES DAILY.  . Probiotic Product (ALIGN PO) Take by  mouth.  . traMADol (ULTRAM) 50 MG tablet Take 25-50 mg by mouth every 6 (six) hours as needed for pain.   . vancomycin (VANCOCIN) 250 MG capsule Take 250 mg by mouth 4 (four) times daily.  Marland Kitchen warfarin (COUMADIN) 2.5 MG tablet TAKE 1 TABLET DAILY OR AS DIRECTED BY COUMADIN CLINIC (Patient taking differently: TAKE 1 WHOLE ON SATURDAY, SUNDAY, TUESDAY AND THURSDAY. TAKE 0.5 TABLET ON MONDAY, WEDNESDAY AND FRIDAY.)  . [DISCONTINUED] rifaximin (XIFAXAN) 550 MG TABS tablet Take 1 tablet (550 mg total) by mouth 3 (three) times daily. (Patient not taking: Reported on 02/24/2015)     Objective: Blood pressure 120/72, pulse 68, temperature 97.4 F (36.3 C), temperature source Oral, resp. rate 18, height 5\' 9"  (1.753 m), weight 165 lb 3.2 oz (74.934 kg). Patient is alert and in no acute distress. She appears anxious. Conjunctiva is pink. Sclera is nonicteric Oropharyngeal mucosa is normal. No neck masses or thyromegaly noted. Cardiac exam with regular rhythm normal S1 and S2. No murmur or gallop noted. Lungs are clear to auscultation. Abdomen is symmetrical. Bowel sounds are hyperactive. On palpation abdomen is soft with mild tenderness in LLQ without guarding or rebound.  No LE edema or clubbing noted.  Labs/studies Results: Stool studies from 02/11/2015  Stool culture negative  O&P negative  C. difficile by PCR positive     Assessment:  #1. C. difficile colitis based on stool studies. Significant symptomatic improvement with by mouth vancomycin. No history of antibiotic use in the last 3  months. #2. IBS. Difficult to control her symptoms with low-dose dicyclomine and extreme anxiety. #3. Increased urinary frequency and urgency.   Plan:  Urinalysis with microscopy. Change dicyclomine to 10 mg by mouth twice a day when necessary. Continue probiotic. Office visit in 3 months.

## 2015-02-25 LAB — URINALYSIS, ROUTINE W REFLEX MICROSCOPIC
Bilirubin Urine: NEGATIVE
Glucose, UA: NEGATIVE mg/dL
HGB URINE DIPSTICK: NEGATIVE
KETONES UR: NEGATIVE mg/dL
LEUKOCYTES UA: NEGATIVE
Nitrite: NEGATIVE
Protein, ur: NEGATIVE mg/dL
SPECIFIC GRAVITY, URINE: 1.022 (ref 1.005–1.030)
UROBILINOGEN UA: 0.2 mg/dL (ref 0.0–1.0)
pH: 6 (ref 5.0–8.0)

## 2015-03-03 ENCOUNTER — Encounter (HOSPITAL_COMMUNITY): Payer: Self-pay | Admitting: Emergency Medicine

## 2015-03-03 ENCOUNTER — Emergency Department (HOSPITAL_COMMUNITY)
Admission: EM | Admit: 2015-03-03 | Discharge: 2015-03-04 | Disposition: A | Discharge status: Home / Self Care | Payer: Medicare HMO | Attending: Emergency Medicine | Admitting: Emergency Medicine

## 2015-03-03 DIAGNOSIS — Y998 Other external cause status: Secondary | ICD-10-CM | POA: Insufficient documentation

## 2015-03-03 DIAGNOSIS — E119 Type 2 diabetes mellitus without complications: Secondary | ICD-10-CM | POA: Insufficient documentation

## 2015-03-03 DIAGNOSIS — Z7901 Long term (current) use of anticoagulants: Secondary | ICD-10-CM | POA: Insufficient documentation

## 2015-03-03 DIAGNOSIS — Z79899 Other long term (current) drug therapy: Secondary | ICD-10-CM | POA: Diagnosis not present

## 2015-03-03 DIAGNOSIS — Z8611 Personal history of tuberculosis: Secondary | ICD-10-CM | POA: Insufficient documentation

## 2015-03-03 DIAGNOSIS — W57XXXA Bitten or stung by nonvenomous insect and other nonvenomous arthropods, initial encounter: Secondary | ICD-10-CM | POA: Diagnosis not present

## 2015-03-03 DIAGNOSIS — F419 Anxiety disorder, unspecified: Secondary | ICD-10-CM | POA: Insufficient documentation

## 2015-03-03 DIAGNOSIS — I10 Essential (primary) hypertension: Secondary | ICD-10-CM | POA: Insufficient documentation

## 2015-03-03 DIAGNOSIS — Z950 Presence of cardiac pacemaker: Secondary | ICD-10-CM | POA: Insufficient documentation

## 2015-03-03 DIAGNOSIS — Z9861 Coronary angioplasty status: Secondary | ICD-10-CM | POA: Insufficient documentation

## 2015-03-03 DIAGNOSIS — Z880 Allergy status to penicillin: Secondary | ICD-10-CM | POA: Insufficient documentation

## 2015-03-03 DIAGNOSIS — Z792 Long term (current) use of antibiotics: Secondary | ICD-10-CM | POA: Diagnosis not present

## 2015-03-03 DIAGNOSIS — S60561A Insect bite (nonvenomous) of right hand, initial encounter: Secondary | ICD-10-CM | POA: Insufficient documentation

## 2015-03-03 DIAGNOSIS — E079 Disorder of thyroid, unspecified: Secondary | ICD-10-CM | POA: Diagnosis not present

## 2015-03-03 DIAGNOSIS — Y9289 Other specified places as the place of occurrence of the external cause: Secondary | ICD-10-CM | POA: Insufficient documentation

## 2015-03-03 DIAGNOSIS — Z9889 Other specified postprocedural states: Secondary | ICD-10-CM | POA: Insufficient documentation

## 2015-03-03 DIAGNOSIS — I2510 Atherosclerotic heart disease of native coronary artery without angina pectoris: Secondary | ICD-10-CM | POA: Diagnosis not present

## 2015-03-03 DIAGNOSIS — Z8673 Personal history of transient ischemic attack (TIA), and cerebral infarction without residual deficits: Secondary | ICD-10-CM | POA: Insufficient documentation

## 2015-03-03 DIAGNOSIS — Y9389 Activity, other specified: Secondary | ICD-10-CM | POA: Insufficient documentation

## 2015-03-03 DIAGNOSIS — I4891 Unspecified atrial fibrillation: Secondary | ICD-10-CM | POA: Insufficient documentation

## 2015-03-03 NOTE — ED Notes (Signed)
Pt st's she was she was lowering the curtains when she felt a sharp pain in right hand.  Pt st's she did not see what it was.  Now right hand red and slightly swollen

## 2015-03-04 MED ORDER — DEXAMETHASONE 4 MG PO TABS
12.0000 mg | ORAL_TABLET | Freq: Once | ORAL | Status: AC
Start: 2015-03-04 — End: 2015-03-04
  Administered 2015-03-04: 12 mg via ORAL
  Filled 2015-03-04: qty 3

## 2015-03-04 MED ORDER — DIPHENHYDRAMINE HCL 25 MG PO CAPS
25.0000 mg | ORAL_CAPSULE | Freq: Once | ORAL | Status: AC
  Administered 2015-03-04: 25 mg via ORAL
  Filled 2015-03-04: qty 1

## 2015-03-04 MED ORDER — DIPHENHYDRAMINE HCL 50 MG/ML IJ SOLN
25.0000 mg | Freq: Once | INTRAMUSCULAR | Status: DC
  Filled 2015-03-04: qty 1

## 2015-03-04 NOTE — Discharge Instructions (Signed)
Take diphenhydramine (Benadryl) as needed. REturn if you develop difficulty breathing, or if you develop a generalized rash.  Insect Bite Mosquitoes, flies, fleas, bedbugs, and many other insects can bite. Insect bites are different from insect stings. A sting is when venom is injected into the skin. Some insect bites can transmit infectious diseases. SYMPTOMS  Insect bites usually turn red, swell, and itch for 2 to 4 days. They often go away on their own. TREATMENT  Your caregiver may prescribe antibiotic medicines if a bacterial infection develops in the bite. HOME CARE INSTRUCTIONS  Do not scratch the bite area.  Keep the bite area clean and dry. Wash the bite area thoroughly with soap and water.  Put ice or cool compresses on the bite area.  Put ice in a plastic bag.  Place a towel between your skin and the bag.  Leave the ice on for 20 minutes, 4 times a day for the first 2 to 3 days, or as directed.  You may apply a baking soda paste, cortisone cream, or calamine lotion to the bite area as directed by your caregiver. This can help reduce itching and swelling.  Only take over-the-counter or prescription medicines as directed by your caregiver.  If you are given antibiotics, take them as directed. Finish them even if you start to feel better. You may need a tetanus shot if:  You cannot remember when you had your last tetanus shot.  You have never had a tetanus shot.  The injury broke your skin. If you get a tetanus shot, your arm may swell, get red, and feel warm to the touch. This is common and not a problem. If you need a tetanus shot and you choose not to have one, there is a rare chance of getting tetanus. Sickness from tetanus can be serious. SEEK IMMEDIATE MEDICAL CARE IF:   You have increased pain, redness, or swelling in the bite area.  You see a red line on the skin coming from the bite.  You have a fever.  You have joint pain.  You have a headache or neck  pain.  You have unusual weakness.  You have a rash.  You have chest pain or shortness of breath.  You have abdominal pain, nausea, or vomiting.  You feel unusually tired or sleepy. MAKE SURE YOU:   Understand these instructions.  Will watch your condition.  Will get help right away if you are not doing well or get worse. Document Released: 12/23/2004 Document Revised: 02/07/2012 Document Reviewed: 06/16/2011 [facility] Patient Information 2015 [place], [place]. This information is not intended to replace advice given to you by your health care provider. Make sure you discuss any questions you have with your health care provider.

## 2015-03-04 NOTE — ED Notes (Signed)
Dr. Glick at the bedside.  

## 2015-03-04 NOTE — ED Provider Notes (Signed)
CSN: [id number]     Arrival date & time 03/03/15  2049 History   First MD Initiated Contact with Patient 03/03/15 2357     Chief Complaint  Patient presents with  . Insect Bite     (Consider location/radiation/quality/duration/timing/severity/associated sxs/prior Treatment) The history is provided by the patient.  81 -year-old female comes in because of pain in her right hand. She states that she lowered her blinds and suddenly, felt a sharp and stabbing pain in her right hand over the fifth MCP joint. The area became swollen and she also developed some redness in her face and some tingling in her face. She denies difficulty breathing or swallowing but she was concerned she was having an allergic reaction. Her husband applied some Silvadene cream and she came to the ED. Since leaving home, the redness in her face is subsided as has the tingling in her face. Her hand and no longer has any pain whatsoever. She was unable to find any insect that may have bitten or stung her.   Past Medical History  Diagnosis Date  . A-fib   . Stroke   . Pacemaker   . Arthritis   . Diabetes mellitus     x 10 yrs at least  . Anxiety   . Polio   . Hx of echocardiogram 09/2012    showed Ef 35-40% with no significant valve disease except for mild to moderate MR. normal RVSP and left atrium was severly dilated.  Marland Kitchen History of stress test 08/2011    negative for ischemia it was nongated and nondiagnostic and compatible with a possible scar or peri-infraction ischemia.   . Coronary artery disease 12/2007  . Hypertension   . Thyroid disease     hypothyroidism   Past Surgical History  Procedure Laterality Date  . Cardiac surgery    . 3 cardiac stents      she has had non-DES stent to the mid circumflex in 2009 and LAD stent to the mid-distal LAD  BMS June 18, 2010  . Abdominal hysterectomy  1978  . Tonsillectomy    . Appendectomy    . Pacemaker placement  08/11/2010    Medtronic Adapta   . Coronary  angioplasty    . Left heart catheterization with coronary angiogram N/A 05/10/2013    Procedure: LEFT HEART CATHETERIZATION WITH CORONARY ANGIOGRAM;  Surgeon: Lennette Bihari, MD;  Location: [facility] CATH LAB;  Service: Cardiovascular;  Laterality: N/A;  . Transthoracic echocardiogram  01/17/2009    EF=>55%.IMPAIRED LV RELAXATION.LA IS MODERATELY DILATED.RA IS MILDLY DILATED. RV SYSTOLIC PRESSURE IS ELEVATED AT 30-40MMHG.MILD TO MODERATE TR.MILD MR.SINCE PRIOR TTE,BOTH ATRIA APPEAR TO BE MORE DILATED.  . Stress perfusion test  10/22/2009    MILD ISCHEMIA IN THE BASAL ANTEROLATERAL AND MID ANTEROLATERAL REGIONS.EF 52%.   Family History  Problem Relation Age of Onset  . Heart disease Father   . Cancer Mother    History  Substance Use Topics  . Smoking status: Never Smoker   . Smokeless tobacco: Never Used  . Alcohol Use: No   OB History    No data available     Review of Systems  All other systems reviewed and are negative.     Allergies  Iohexol; Penicillins; and Sulfur  Home Medications   Prior to Admission medications   Medication Sig Start Date End Date Taking? Authorizing Provider  ALPRAZolam (XANAX) 0.25 MG tablet Take 0.25 mg by mouth at bedtime.    Yes Historical Provider, MD  dicyclomine (BENTYL) 10 MG capsule 1 capsule by mouth twice daily as needed for abdominal pain or cramps. 02/24/15  Yes Malissa Hippo, MD  digoxin (LANOXIN) 0.125 MG tablet Take 1 tablet (0.125 mg total) by mouth daily. 12/30/14  Yes Lennette Bihari, MD  hydrochlorothiazide (MICROZIDE) 12.5 MG capsule Take 1 capsule (12.5 mg total) by mouth daily. 05/10/14  Yes Lennette Bihari, MD  isosorbide mononitrate (IMDUR) 30 MG 24 hr tablet TAKE 1 TABLET BY MOUTH DAILY 01/02/15  Yes Lennette Bihari, MD  levothyroxine (SYNTHROID, LEVOTHROID) 100 MCG tablet Take 100 mcg by mouth daily.   Yes Historical Provider, MD  loperamide (IMODIUM) 2 MG capsule Take by mouth as needed for diarrhea or loose stools.   Yes Historical  Provider, MD  MELATONIN PO Take 1 tablet by mouth 2 (two) times daily.    Yes Historical Provider, MD  metoprolol (LOPRESSOR) 50 MG tablet TAKE 1 TABLET (50 MG TOTAL) BY MOUTH 2 (TWO) TIMES DAILY. 12/02/14  Yes Lennette Bihari, MD  Probiotic Product (ALIGN PO) Take 1 tablet by mouth daily.    Yes Historical Provider, MD  traMADol (ULTRAM) 50 MG tablet Take 25-50 mg by mouth every 6 (six) hours as needed for pain.    Yes Historical Provider, MD  warfarin (COUMADIN) 2.5 MG tablet TAKE 1 TABLET DAILY OR AS DIRECTED BY COUMADIN CLINIC Patient taking differently: TAKE 1 WHOLE ON SATURDAY, SUNDAY, TUESDAY AND THURSDAY. TAKE 0.5 TABLET ON MONDAY, WEDNESDAY AND FRIDAY. 11/04/14  Yes Lennette Bihari, MD  vancomycin (VANCOCIN) 250 MG capsule Take 250 mg by mouth 4 (four) times daily.    Historical Provider, MD   BP 151/104 mmHg  Pulse 70  Temp(Src) 97.6 F (36.4 C) (Oral)  Resp 17  Ht 5\' 9"  (1.753 m)  Wt 162 lb (73.483 kg)  BMI 23.91 kg/m2  SpO2 96% Physical Exam  Nursing note and vitals reviewed.  81 year old female, resting comfortably and in no acute distress. Vital signs are significant for hypertension96. Oxygen saturation is 96%, which is normal. Head is normocephalic and atraumatic. PERRLA, EOMI. Oropharynx is clear. Neck is nontender and supple without adenopathy or JVD. Back is nontender and there is no CVA tenderness. Lungs are clear without rales, wheezes, or rhonchi. Chest is nontender. Heart has regular rate and rhythm without murmur. Abdomen is soft, flat, nontender without masses or hepatosplenomegaly and peristalsis is normoactive. Extremities have 1+ edema, full range of motion is present. There is an area of mild erythema over the right fifth MCP joint on the dorsum. No significant swelling is seen. The area is not warm to touch. Heberden's and Bouchard's nodes are also noted. Skin is warm and dry without rash. Neurologic: Mental status is normal, cranial nerves are intact, there  are no motor or sensory deficits.  ED Course  Procedures (including critical care time)   MDM   Final diagnoses:  Insect bite hand, right, initial encounter    Apparent arthropod bite or sting the right hand which is resolving. No evidence of systemic reaction at this time. She initially had some itching but no longer has any. She is given a dose of diphenhydramine and a dose of dexamethasone and is discharged with instructions to return should symptoms worsen.    Dione Booze, MD 03/04/15 [facility] Manners

## 2015-03-05 ENCOUNTER — Encounter (INDEPENDENT_AMBULATORY_CARE_PROVIDER_SITE_OTHER): Payer: Self-pay | Admitting: Internal Medicine

## 2015-03-05 ENCOUNTER — Telehealth (INDEPENDENT_AMBULATORY_CARE_PROVIDER_SITE_OTHER): Payer: Self-pay | Admitting: *Deleted

## 2015-03-05 DIAGNOSIS — R197 Diarrhea, unspecified: Secondary | ICD-10-CM

## 2015-03-05 DIAGNOSIS — R109 Unspecified abdominal pain: Secondary | ICD-10-CM

## 2015-03-05 NOTE — Telephone Encounter (Signed)
[name] would like for Dr. Karilyn Cota to know [name] is having a lot of diarrhea and pain. She got stung and went to the ED on 03/03/15. At that time she was put on Benadryl. Not sure if this had anything to do with the diarrhea and/or pain. His return phone number is [phone].

## 2015-03-06 ENCOUNTER — Other Ambulatory Visit (INDEPENDENT_AMBULATORY_CARE_PROVIDER_SITE_OTHER): Payer: Self-pay | Admitting: Internal Medicine

## 2015-03-06 ENCOUNTER — Telehealth (HOSPITAL_COMMUNITY): Payer: Self-pay

## 2015-03-06 ENCOUNTER — Telehealth (HOSPITAL_BASED_OUTPATIENT_CLINIC_OR_DEPARTMENT_OTHER): Payer: Self-pay | Admitting: Emergency Medicine

## 2015-03-06 DIAGNOSIS — K580 Irritable bowel syndrome with diarrhea: Secondary | ICD-10-CM

## 2015-03-06 DIAGNOSIS — R197 Diarrhea, unspecified: Secondary | ICD-10-CM

## 2015-03-06 DIAGNOSIS — R109 Unspecified abdominal pain: Secondary | ICD-10-CM

## 2015-03-06 DIAGNOSIS — Z8719 Personal history of other diseases of the digestive system: Secondary | ICD-10-CM

## 2015-03-06 NOTE — Telephone Encounter (Signed)
Per Dr.Rehman patient is having abdominal pian and diarrhea again. She will need to have C-Diff , and Abdominal Pelvic CT. C-Diff noted.

## 2015-03-06 NOTE — Telephone Encounter (Signed)
Encounter complete. 

## 2015-03-06 NOTE — Telephone Encounter (Addendum)
Patient is allergic to dye and per Dr Karilyn Cota do oral contrast only. CT abd/pelvis w/o sch'd 03/07/15 at 100 (1245), patient aware

## 2015-03-07 ENCOUNTER — Ambulatory Visit (HOSPITAL_COMMUNITY)
Admission: RE | Admit: 2015-03-07 | Discharge: 2015-03-07 | Disposition: A | Discharge status: Home / Self Care | Payer: Medicare HMO | Source: Ambulatory Visit | Attending: Internal Medicine | Admitting: Internal Medicine

## 2015-03-07 ENCOUNTER — Other Ambulatory Visit (HOSPITAL_COMMUNITY): Payer: Medicare HMO

## 2015-03-07 DIAGNOSIS — I4891 Unspecified atrial fibrillation: Secondary | ICD-10-CM | POA: Insufficient documentation

## 2015-03-07 DIAGNOSIS — R197 Diarrhea, unspecified: Secondary | ICD-10-CM | POA: Diagnosis not present

## 2015-03-07 DIAGNOSIS — R109 Unspecified abdominal pain: Secondary | ICD-10-CM | POA: Insufficient documentation

## 2015-03-07 LAB — C. DIFFICILE GDH AND TOXIN A/B
C. DIFFICILE GDH: NOT DETECTED
C. difficile Toxin A/B: NOT DETECTED

## 2015-03-10 ENCOUNTER — Telehealth (INDEPENDENT_AMBULATORY_CARE_PROVIDER_SITE_OTHER): Payer: Self-pay | Admitting: *Deleted

## 2015-03-10 NOTE — Telephone Encounter (Signed)
Addressed with Dr.Rehman. He states that he will agrree to the following ;Levothyroine 100 mcg - Take 1 daily -1 month no refills. Xanax 0.25 mg take 1 by mouth BID PRN - # 28 no refills. Future prescripitons will need to filled by new PCP. [name] was made aware. The above was called to [employer].

## 2015-03-10 NOTE — Telephone Encounter (Signed)
[name] would like to speak with Dr.Rehman. He has a question about a medication Valmai in taking. The return phone number is [phone] or [phone].

## 2015-03-10 NOTE — Telephone Encounter (Signed)
Mr. [name] called and [name] is in a deliema.  Their insurance changed and the PCP is now out of network.  He didn't realize she had been completely out of her thyroid medication (most important) and her Xanax for 5 days. PCP will not fill since she is changing MD.  They do have an appointment with a new PCP here but not until 03/18/15.  He was wondering if you could help them out.  Levothyroxine 100 mcg. 1 daily Alprazolam .025 1 at bedtime however she can take up to 3 daily if needed.  Uses [facility] if able to help  Please call him back either way [phone] or [phone]

## 2015-03-10 NOTE — Telephone Encounter (Signed)
This has been addressed.

## 2015-03-11 ENCOUNTER — Encounter (HOSPITAL_COMMUNITY): Payer: Medicare HMO

## 2015-03-12 ENCOUNTER — Telehealth (INDEPENDENT_AMBULATORY_CARE_PROVIDER_SITE_OTHER): Payer: Self-pay | Admitting: *Deleted

## 2015-03-12 NOTE — Telephone Encounter (Signed)
Patient called. She states that her lower abdomen is hurting her and it is severe. Denies fever. Diarrhea , and she has went to the bathroom 3 times now. She has taken 1 Imodium this morning prior to her breakfast, and she says that she takes these 3 times daily,the other 2 times are at [employer] and at bedtime.

## 2015-03-12 NOTE — Telephone Encounter (Signed)
Per Dr.Rehman ,if the patient is having severe pain. She should go to the ED for further evaluation. Patient was called and made aware.

## 2015-03-12 NOTE — Telephone Encounter (Signed)
[name] would like for Dr. Karilyn Cota to know [name] is having serve lower abd pain with diarrhea. His return phone number is [phone].

## 2015-03-12 NOTE — Telephone Encounter (Signed)
Patient's husband was advised of Recommendation. He states that the pain is not constant , it starts before the patient feels the need to go to the bathroom, then after she goes the pain gets better.  He also ask about the result of a previous CT,04/08/016. Advised that D.Rehman would be made aware.  Forwarded to Dr.Rehman

## 2015-03-12 NOTE — Telephone Encounter (Signed)
Patient's call returned. Patient advised to take dicyclomine 10 mg before breakfast and lunch. Take Imodium 1 mg once or twice daily as needed.

## 2015-03-24 ENCOUNTER — Ambulatory Visit (INDEPENDENT_AMBULATORY_CARE_PROVIDER_SITE_OTHER): Payer: Medicare HMO | Admitting: *Deleted

## 2015-03-24 DIAGNOSIS — I4891 Unspecified atrial fibrillation: Secondary | ICD-10-CM

## 2015-03-24 DIAGNOSIS — I482 Chronic atrial fibrillation, unspecified: Secondary | ICD-10-CM

## 2015-03-24 DIAGNOSIS — Z5181 Encounter for therapeutic drug level monitoring: Secondary | ICD-10-CM

## 2015-03-24 DIAGNOSIS — Z7901 Long term (current) use of anticoagulants: Secondary | ICD-10-CM

## 2015-03-24 LAB — POCT INR: INR: 2.5

## 2015-03-28 ENCOUNTER — Encounter: Payer: Self-pay | Admitting: Family

## 2015-03-28 ENCOUNTER — Ambulatory Visit (INDEPENDENT_AMBULATORY_CARE_PROVIDER_SITE_OTHER): Payer: Medicare HMO | Admitting: Family

## 2015-03-28 ENCOUNTER — Other Ambulatory Visit (INDEPENDENT_AMBULATORY_CARE_PROVIDER_SITE_OTHER): Payer: Medicare HMO

## 2015-03-28 VITALS — BP 120/74 | HR 59 | Temp 97.4°F | Resp 18 | Ht 69.0 in | Wt 163.8 lb

## 2015-03-28 DIAGNOSIS — E039 Hypothyroidism, unspecified: Secondary | ICD-10-CM

## 2015-03-28 DIAGNOSIS — F411 Generalized anxiety disorder: Secondary | ICD-10-CM

## 2015-03-28 DIAGNOSIS — I119 Hypertensive heart disease without heart failure: Secondary | ICD-10-CM | POA: Diagnosis not present

## 2015-03-28 DIAGNOSIS — I482 Chronic atrial fibrillation, unspecified: Secondary | ICD-10-CM

## 2015-03-28 LAB — TSH: TSH: 1 u[IU]/mL (ref 0.35–4.50)

## 2015-03-28 MED ORDER — LEVOTHYROXINE SODIUM 100 MCG PO TABS
100.0000 ug | ORAL_TABLET | Freq: Every day | ORAL | Status: DC
Start: 2015-03-28 — End: 2015-04-06

## 2015-03-28 MED ORDER — ALPRAZOLAM 0.25 MG PO TABS
0.2500 mg | ORAL_TABLET | Freq: Two times a day (BID) | ORAL | Status: DC
Start: 2015-03-28 — End: 2016-03-04

## 2015-03-28 NOTE — Assessment & Plan Note (Signed)
Stable with current dose of levothyroxine. Obtain TSH. Continue current dosage of levothyroxine pending lab results.

## 2015-03-28 NOTE — Assessment & Plan Note (Signed)
Exam today rate controlled atrial fibrillation. No evidence of nuisance bleeding. Continue anticoagulation at current dosage. Follow up at Coumadin clinic for adjustments as needed.

## 2015-03-28 NOTE — Assessment & Plan Note (Signed)
Stable with current regimen and blood pressure is below goal of 140/90. Continue current regimen and dosages of metoprolol, isosorbide mononitrate, digoxin and HCTZ. Follow up with cardiology as indicated.

## 2015-03-28 NOTE — Patient Instructions (Signed)
Thank you for choosing [facility].  Summary/Instructions:  Your prescription(s) have been submitted to your pharmacy or been printed and provided for you. Please take as directed and contact our office if you believe you are having problem(s) with the medication(s) or have any questions.  Please stop by the lab on the basement level of the building for your blood work. Your results will be released to MyChart (or called to you) after review, usually within 72 hours after test completion. If any changes need to be made, you will be notified at that same time.   

## 2015-03-28 NOTE — Progress Notes (Signed)
Pre visit review using our clinic review tool, if applicable. No additional management support is needed unless otherwise documented below in the visit note. 

## 2015-03-28 NOTE — Progress Notes (Signed)
Subjective:    Patient ID: [name], female    DOB: [birth year], 81 y.o.   MRN: [id number]  Chief Complaint  Patient presents with  . Establish Care    refill of xanax and levothyroxine    HPI:  [name] is a 81 y.o. female with a PMH of atrial fibrillation, GERD, hypothyroidism, and CAD who presents today for an office visit to establish care and discuss her medication.  1) Hypothyroidism - Currently maintained on levothyroxine. Indicates that it has been fairly stable for a significant period of time.   Lab Results  Component Value Date   TSH 1.00 03/28/2015    2) Anxiety - Previously diagnosed with anxiety which resulted in her inability to sleep. She has been maintained on Xanax 0.25mg  nightly to help her sleep. She as been doing this for several years since her heart attack.  3) Hypertensive heart disease - Currently maintained on HCTZ, metoprolol, isosorbide dinitrate, and digoxin. Indicates she takes her medications as prescribed and denies any adverse effects of medication.   BP Readings from Last 3 Encounters:  03/28/15 120/74  03/04/15 134/72  02/24/15 120/72    4) Atrial fibrillation - anticoagulation maintained on warfarin. Takes her medication as prescribed and notes that she does bruise a little easier than she had in the past. She is followed by the [facility].   Lab Results  Component Value Date   INR 2.5 03/24/2015   INR 2.58* 02/17/2015   INR 2.5 02/10/2015    Allergies  Allergen Reactions  . Iohexol Other (See Comments)     Desc: CHEST PAIN, LOC AFTER HEART CATH, PT REFUSED DYE   . Penicillins Other (See Comments)    REACTION:  unknown  . Sulfur Other (See Comments)    REACTION:  unknown    Current Outpatient Prescriptions on File Prior to Visit  Medication Sig Dispense Refill  . ALPRAZolam (XANAX) 0.25 MG tablet Take 0.25 mg by mouth 2 (two) times daily.     Marland Kitchen dicyclomine (BENTYL) 10 MG capsule 1  capsule by mouth twice daily as needed for abdominal pain or cramps. 60 capsule 5  . digoxin (LANOXIN) 0.125 MG tablet Take 1 tablet (0.125 mg total) by mouth daily. 30 tablet 6  . hydrochlorothiazide (MICROZIDE) 12.5 MG capsule Take 1 capsule (12.5 mg total) by mouth daily. 90 capsule 3  . isosorbide mononitrate (IMDUR) 30 MG 24 hr tablet TAKE 1 TABLET BY MOUTH DAILY 30 tablet 11  . levothyroxine (SYNTHROID, LEVOTHROID) 100 MCG tablet Take 100 mcg by mouth daily.    Marland Kitchen loperamide (IMODIUM) 2 MG capsule Take by mouth as needed for diarrhea or loose stools.    Marland Kitchen MELATONIN PO Take 1 tablet by mouth 2 (two) times daily.     . metoprolol (LOPRESSOR) 50 MG tablet TAKE 1 TABLET (50 MG TOTAL) BY MOUTH 2 (TWO) TIMES DAILY. 60 tablet 11  . Probiotic Product (ALIGN PO) Take 1 tablet by mouth daily.     . traMADol (ULTRAM) 50 MG tablet Take 25-50 mg by mouth every 6 (six) hours as needed for pain.     Marland Kitchen warfarin (COUMADIN) 2.5 MG tablet TAKE 1 TABLET DAILY OR AS DIRECTED BY COUMADIN CLINIC (Patient taking differently: TAKE 1 WHOLE ON SATURDAY, SUNDAY, TUESDAY AND THURSDAY. TAKE 0.5 TABLET ON MONDAY, WEDNESDAY AND FRIDAY.) 30 tablet 5   No current facility-administered medications on file prior to visit.    Past Medical History  Diagnosis Date  . A-fib   . Stroke   . Pacemaker   . Arthritis   . Diabetes mellitus     x 10 yrs at least  . Anxiety   . Polio   . Hx of echocardiogram 09/2012    showed Ef 35-40% with no significant valve disease except for mild to moderate MR. normal RVSP and left atrium was severly dilated.  Marland Kitchen History of stress test 08/2011    negative for ischemia it was nongated and nondiagnostic and compatible with a possible scar or peri-infraction ischemia.   . Coronary artery disease 12/2007  . Hypertension   . Thyroid disease     hypothyroidism    Past Surgical History  Procedure Laterality Date  . Cardiac surgery    . 3 cardiac stents      she has had non-DES stent to the  mid circumflex in 2009 and LAD stent to the mid-distal LAD  BMS June 18, 2010  . Abdominal hysterectomy  1978  . Tonsillectomy    . Appendectomy    . Pacemaker placement  08/11/2010    Medtronic Adapta   . Coronary angioplasty    . Left heart catheterization with coronary angiogram N/A 05/10/2013    Procedure: LEFT HEART CATHETERIZATION WITH CORONARY ANGIOGRAM;  Surgeon: Lennette Bihari, MD;  Location: [facility] CATH LAB;  Service: Cardiovascular;  Laterality: N/A;  . Transthoracic echocardiogram  01/17/2009    EF=>55%.IMPAIRED LV RELAXATION.LA IS MODERATELY DILATED.RA IS MILDLY DILATED. RV SYSTOLIC PRESSURE IS ELEVATED AT 30-40MMHG.MILD TO MODERATE TR.MILD MR.SINCE PRIOR TTE,BOTH ATRIA APPEAR TO BE MORE DILATED.  . Stress perfusion test  10/22/2009    MILD ISCHEMIA IN THE BASAL ANTEROLATERAL AND MID ANTEROLATERAL REGIONS.EF 52%.    Family History  Problem Relation Age of Onset  . Heart disease Father   . Cancer Mother     History   Social History  . Marital Status: Married    Spouse Name: N/A  . Number of Children: N/A  . Years of Education: N/A   Occupational History  . Not on file.   Social History Main Topics  . Smoking status: Never Smoker   . Smokeless tobacco: Never Used  . Alcohol Use: No  . Drug Use: No  . Sexual Activity: Not on file   Other Topics Concern  . Not on file   Social History Narrative    Review of Systems  Eyes:       Negative for changes in vision.  Respiratory: Negative for chest tightness.   Cardiovascular: Negative for chest pain, palpitations and leg swelling.      Objective:    BP 120/74 mmHg  Pulse 59  Temp(Src) 97.4 F (36.3 C) (Oral)  Resp 18  Ht  (1.753 m)  Wt 163 lb 12.8 oz (74.299 kg)  BMI 24.18 kg/m2  SpO2 97% Nursing note and vital signs reviewed.  Physical Exam  Constitutional: She is oriented to person, place, and time. She appears well-developed and well-nourished. No distress.  Neck: Neck supple. No thyromegaly  present.  Cardiovascular: Normal rate, regular rhythm, normal heart sounds and intact distal pulses.   Mild non-pitting edema noted bilaterally.   Pulmonary/Chest: Effort normal and breath sounds normal.  Neurological: She is alert and oriented to person, place, and time.  Skin: Skin is warm and dry.  Psychiatric: She has a normal mood and affect. Her behavior is normal. Judgment and thought content normal.       Assessment & Plan:

## 2015-03-28 NOTE — Assessment & Plan Note (Signed)
Stable with current regimen of Xanax at night and once daily as needed for anxiety. Discussed risks of taking medication including falls. Patient indicates without medication her mind races at night. Continue current dosage of Xanax.

## 2015-04-01 ENCOUNTER — Encounter: Payer: Medicare HMO | Admitting: *Deleted

## 2015-04-01 ENCOUNTER — Telehealth: Payer: Self-pay | Admitting: Cardiology

## 2015-04-01 NOTE — Telephone Encounter (Signed)
Spoke with pt and reminded pt of remote transmission that is due today. Pt verbalized understanding.   

## 2015-04-02 ENCOUNTER — Encounter: Payer: Self-pay | Admitting: Cardiology

## 2015-04-06 ENCOUNTER — Other Ambulatory Visit (INDEPENDENT_AMBULATORY_CARE_PROVIDER_SITE_OTHER): Payer: Self-pay | Admitting: Internal Medicine

## 2015-04-16 ENCOUNTER — Telehealth: Payer: Self-pay | Admitting: Cardiology

## 2015-04-16 NOTE — Telephone Encounter (Signed)
Pt husband stated that he has attempted to send remote transmission w/ wirex but has been unsuccessful due to no cell signal on wirex. I informed him that I could order a my carelink smart that will allow him to use his smart technology to send transmission. Pt husband agreed to this and said he would send transmission as soon as my carelink smart is received.

## 2015-04-21 ENCOUNTER — Ambulatory Visit (INDEPENDENT_AMBULATORY_CARE_PROVIDER_SITE_OTHER): Payer: Medicare HMO | Admitting: *Deleted

## 2015-04-21 DIAGNOSIS — I495 Sick sinus syndrome: Secondary | ICD-10-CM

## 2015-04-25 NOTE — Progress Notes (Signed)
Remote pacemaker transmission.   

## 2015-05-02 LAB — CUP PACEART REMOTE DEVICE CHECK
Battery Voltage: 2.75 V
Brady Statistic RV Percent Paced: 53 %
Lead Channel Impedance Value: 0 Ohm
Lead Channel Impedance Value: 670 Ohm
Lead Channel Pacing Threshold Pulse Width: 0.4 ms
Lead Channel Sensing Intrinsic Amplitude: 5.6 mV
Lead Channel Setting Pacing Amplitude: 2.5 V
Lead Channel Setting Pacing Pulse Width: 0.4 ms
Lead Channel Setting Sensing Sensitivity: 2 mV
MDC IDC MSMT BATTERY IMPEDANCE: 1448 Ohm
MDC IDC MSMT BATTERY REMAINING LONGEVITY: 51 mo
MDC IDC MSMT LEADCHNL RV PACING THRESHOLD AMPLITUDE: 0.375 V
MDC IDC SESS DTM: 20160523231757

## 2015-05-05 ENCOUNTER — Ambulatory Visit (INDEPENDENT_AMBULATORY_CARE_PROVIDER_SITE_OTHER): Payer: Medicare HMO | Admitting: Internal Medicine

## 2015-05-09 ENCOUNTER — Encounter: Payer: Self-pay | Admitting: Cardiology

## 2015-05-13 ENCOUNTER — Encounter: Payer: Self-pay | Admitting: Cardiovascular Disease

## 2015-05-19 ENCOUNTER — Encounter: Payer: Self-pay | Admitting: Cardiovascular Disease

## 2015-05-19 ENCOUNTER — Ambulatory Visit (INDEPENDENT_AMBULATORY_CARE_PROVIDER_SITE_OTHER): Payer: Medicare HMO | Admitting: Cardiovascular Disease

## 2015-05-19 VITALS — BP 128/78 | HR 62 | Ht 69.5 in | Wt 159.4 lb

## 2015-05-19 DIAGNOSIS — I119 Hypertensive heart disease without heart failure: Secondary | ICD-10-CM

## 2015-05-19 DIAGNOSIS — I495 Sick sinus syndrome: Secondary | ICD-10-CM | POA: Diagnosis not present

## 2015-05-19 DIAGNOSIS — Z7901 Long term (current) use of anticoagulants: Secondary | ICD-10-CM

## 2015-05-19 DIAGNOSIS — I2510 Atherosclerotic heart disease of native coronary artery without angina pectoris: Secondary | ICD-10-CM | POA: Diagnosis not present

## 2015-05-19 NOTE — Patient Instructions (Signed)
Your physician wants you to follow-up in: 6 months or sooner if needed. You will receive a reminder letter in the mail two months in advance. If you don't receive a letter, please call our office to schedule the follow-up appointment. 

## 2015-05-19 NOTE — Progress Notes (Signed)
Patient ID: MEYA CLUTTER, female   DOB: 1930/01/11, 81 y.o.   MRN: [id number]     HPI: [name] is a 81 y.o. female who presents to the office today for a 6 month follow up cardiology evaluation.  [name] has known CAD and underwent bare-metal stenting to her circumflex coronary artery in 2009.  She had mild 30-40% RCA stenosis and a normal LAD. She has a history of permanent atrial fibrillation, as well as hyperthyroidism.  She status post permanent pacemaker for sick sinus syndrome.  She has remote history of polio.  She had been followed by Dr. Rollene Fare in the past for pacemaker followup  with his last office visit being in September 2014.  She has also issues with lower GI tract abnormality and sees Dr. Laural Golden in [place].   An echo Doppler study in November 2013 showed an ejection fraction of 35-40%.  She had diastolic dysfunction with mild to moderate septal hypokinesis.  She is severe LA dilatation.  There was mild mitral annular calcification with mild-to-moderate mitral regurgitation.  She had mild TR.  There was evidence for aortic sclerosis without stenosis.  There was evidence for mild pulmonary hypertension.  She has had leg swelling.  This has resolved with reinstitution of low-dose hydrochlorothiazide.  She also is a history of hypothyroidism.  She is on chronic Coumadin anticoagulation.   A followup echo Doppler study on 06/25/2014 revealed an EF of 60-65%.  There was severe biatrial enlargement.  There was evidence for mitral annular calcification with trivial MR.  Chest mild TR.  Pacemaker wire was present in the right heart.  She has irritable bowel syndrome.  Since I last saw her, she apparently had GI issues for which she was followed by Dr. Laural Golden.  She tested positive for C. Difficile and underwent successful treatment.  When I last saw her, she had not yet instituted  telephonic monitoring of her pacemaker.  There were given a home device, but never instructed on  how to utilize it.  She has now initiated telephonic recording.  She saw Dr. Sallyanne Kuster in February 2016 for pacemaker evaluation.  On 05/02/2015 where she had a telephonic recording where she had appropriate pacemaker function.  She denies chest pain.  She does me to fatigability.  She admits to trivial ankle swelling.  She is on Coumadin anticoagulation without bleeding.   Past Medical History  Diagnosis Date  . A-fib   . Stroke   . Pacemaker   . Arthritis   . Diabetes mellitus     x 10 yrs at least  . Anxiety   . Polio   . Hx of echocardiogram 09/2012    showed Ef 35-40% with no significant valve disease except for mild to moderate MR. normal RVSP and left atrium was severly dilated.  Marland Kitchen History of stress test 08/2011    negative for ischemia it was nongated and nondiagnostic and compatible with a possible scar or peri-infraction ischemia.   . Coronary artery disease 12/2007  . Hypertension   . Thyroid disease     hypothyroidism    Past Surgical History  Procedure Laterality Date  . Cardiac surgery    . 3 cardiac stents      she has had non-DES stent to the mid circumflex in 2009 and LAD stent to the mid-distal LAD  BMS June 18, 2010  . Abdominal hysterectomy  1978  . Tonsillectomy    . Appendectomy    . Pacemaker placement  08/11/2010    Medtronic Adapta   . Coronary angioplasty    . Left heart catheterization with coronary angiogram N/A 05/10/2013    Procedure: LEFT HEART CATHETERIZATION WITH CORONARY ANGIOGRAM;  Surgeon: Troy Sine, MD;  Location: [facility] CATH LAB;  Service: Cardiovascular;  Laterality: N/A;  . Transthoracic echocardiogram  01/17/2009    EF=>55%.IMPAIRED LV RELAXATION.LA IS MODERATELY DILATED.RA IS MILDLY DILATED. RV SYSTOLIC PRESSURE IS ELEVATED AT 30-40MMHG.MILD TO MODERATE TR.MILD MR.SINCE PRIOR TTE,BOTH ATRIA APPEAR TO BE MORE DILATED.  . Stress perfusion test  10/22/2009    MILD ISCHEMIA IN THE BASAL ANTEROLATERAL AND MID ANTEROLATERAL REGIONS.EF 52%.     Allergies  Allergen Reactions  . Iohexol Other (See Comments)     Desc: CHEST PAIN, LOC AFTER HEART CATH, PT REFUSED DYE   . Penicillins Other (See Comments)    REACTION:  unknown  . Sulfur Other (See Comments)    REACTION:  unknown    Current Outpatient Prescriptions  Medication Sig Dispense Refill  . ALPRAZolam (XANAX) 0.25 MG tablet Take 1 tablet (0.25 mg total) by mouth 2 (two) times daily. 60 tablet 0  . dicyclomine (BENTYL) 10 MG capsule 1 capsule by mouth twice daily as needed for abdominal pain or cramps. 60 capsule 5  . digoxin (LANOXIN) 0.125 MG tablet Take 1 tablet (0.125 mg total) by mouth daily. 30 tablet 6  . isosorbide mononitrate (IMDUR) 30 MG 24 hr tablet TAKE 1 TABLET BY MOUTH DAILY 30 tablet 11  . levothyroxine (SYNTHROID, LEVOTHROID) 100 MCG tablet TAKE 1 TABLET BY MOUTH EVERY DAY 30 tablet 5  . loperamide (IMODIUM) 2 MG capsule Take by mouth as needed for diarrhea or loose stools.    . metoprolol (LOPRESSOR) 50 MG tablet TAKE 1 TABLET (50 MG TOTAL) BY MOUTH 2 (TWO) TIMES DAILY. 60 tablet 11  . Probiotic Product (ALIGN PO) Take 1 tablet by mouth daily.     . traMADol (ULTRAM) 50 MG tablet Take 25-50 mg by mouth every 6 (six) hours as needed for pain.     Marland Kitchen warfarin (COUMADIN) 2.5 MG tablet TAKE 1 TABLET DAILY OR AS DIRECTED BY COUMADIN CLINIC (Patient taking differently: TAKE 1 WHOLE ON SATURDAY, SUNDAY, TUESDAY AND THURSDAY. TAKE 0.5 TABLET ON MONDAY, WEDNESDAY AND FRIDAY.) 30 tablet 5   No current facility-administered medications for this visit.    History   Social History  . Marital Status: Married    Spouse Name: N/A  . Number of Children: N/A  . Years of Education: N/A   Occupational History  . Not on file.   Social History Main Topics  . Smoking status: Never Smoker   . Smokeless tobacco: Never Used  . Alcohol Use: No  . Drug Use: No  . Sexual Activity: Not on file   Other Topics Concern  . Not on file   Social History Narrative     Socially she is married, has 5 children, 5 grandchildren 2 great-grandchildren.  She does not exercise.  Family History  Problem Relation Age of Onset  . Heart disease Father   . Cancer Mother     ROS General: Negative; No fevers, chills, or night sweats HEENT: Negative; No changes in vision or hearing, sinus congestion, difficulty swallowing Pulmonary: Negative; No cough, wheezing, shortness of breath, hemoptysis Cardiovascular: See HPI: No chest pain, presyncope, syncope, palpatations GI: Positive IBS-like symptoms.  She was recently told to take IBGuard; No nausea, vomiting, diarrhea.  Positive for GERD, as well as colonic diverticulitis remotely.  GU: Negative; No dysuria, hematuria, or difficulty voiding Musculoskeletal: Negative; no myalgias, joint pain, or weakness Hematologic: Negative; no easy bruising, bleeding Endocrine: Positive for hypothyroidism no diabetes, Neuro: Negative; no changes in balance, headaches Skin: Negative; No rashes or skin lesions Psychiatric: She does have mild anxiety.; No behavioral problems, depression Sleep: Negative; No snoring,  daytime sleepiness, hypersomnolence, bruxism, restless legs, hypnogognic hallucinations. Other comprehensive 14 point system review is negative   Physical Exam BP 128/78 mmHg  Pulse 62  Ht 5' 9.5" (1.765 m)  Wt 159 lb 6.4 oz (72.303 kg)  BMI 23.21 kg/m2 Repeat blood pressure by me was 124/70  Wt Readings from Last 3 Encounters:  05/19/15 159 lb 6.4 oz (72.303 kg)  03/28/15 163 lb 12.8 oz (74.299 kg)  03/03/15 162 lb (73.483 kg)   General: Alert, oriented, no distress.  Skin: normal turgor, no rashes, warm and dry HEENT: Normocephalic, atraumatic. Pupils equal round and reactive to light; sclera anicteric; extraocular muscles intact, No lid lag; Nose without nasal septal hypertrophy; Mouth/Parynx benign; Mallinpatti scale 3 Neck: No JVD, no carotid bruits; normal carotid upstroke Lungs: clear to ausculatation  and percussion bilaterally; no wheezing or rales, normal inspiratory and expiratory effort Chest wall: without tenderness to palpitation Heart: PMI not displaced, RRR, s1 s2 normal, 2/6 systolic murmur, No diastolic murmur, no rubs, gallops, thrills, or heaves Abdomen: soft, nontender; no hepatosplenomehaly, BS+; abdominal aorta nontender and not dilated by palpation. Back: no CVA tenderness Pulses: 2+  Musculoskeletal: full range of motion, normal strength, no joint deformities Extremities: Trivial edema , right ankle greater than left; Pulses 2+, no clubbing cyanosis , Homan's sign negative  Neurologic: grossly nonfocal; Cranial nerves grossly wnl Psychologic: Normal mood and affect  ECG (independently read by me): Underlying atrial fibrillation.  Ventricular paced rhythm.  Rate 62 bpm.  December 2015 ECG (independently read by me): Underlying atrial fibrillation with ventricular paced rhythm at 62 bpm.  Prior August 2015 ECG (independently read by the): Atrial fibrillation at 64 beats per minute.  QTc interval 466 ms.  Paced rhythm   05/10/2014 ECG (independently read by me): Atrial fibrillation at 71 beats per minute.  Isolated PVC.  Nonspecific ST changes.  LABS:  BMP Latest Ref Rng 01/21/2015 12/21/2014 02/14/2014  Glucose 70 - 99 mg/dL 126(H) 89 -  BUN 6 - 23 mg/dL 19 20 -  Creatinine 0.50 - 1.10 mg/dL 0.62 0.67 0.58  Sodium 135 - 145 mmol/L 138 137 -  Potassium 3.5 - 5.1 mmol/L 3.5 4.0 -  Chloride 96 - 112 mmol/L 101 98 -  CO2 19 - 32 mmol/L 29 31 -  Calcium 8.4 - 10.5 mg/dL 9.0 9.3 -   Hepatic Function Latest Ref Rng 09/30/2013 01/09/2013 11/08/2012  Total Protein 6.0 - 8.3 g/dL 6.3 6.7 6.5  Albumin 3.5 - 5.2 g/dL 3.5 3.6 3.8  AST 0 - 37 U/L 13 21 14   ALT 0 - 35 U/L 11 14 11   Alk Phosphatase 39 - 117 U/L 75 83 82  Total Bilirubin 0.3 - 1.2 mg/dL 0.6 0.4 1.1  Bilirubin, Direct 0.0 - 0.3 mg/dL - - 0.2   CBC Latest Ref Rng 01/21/2015 12/21/2014 09/30/2013  WBC 4.0 - 10.5 K/uL  7.6 6.9 7.1  Hemoglobin 12.0 - 15.0 g/dL 14.1 14.2 13.7  Hematocrit 36.0 - 46.0 % 43.8 43.6 40.8  Platelets 150 - 400 K/uL 196 212 204   Lab Results  Component Value Date   MCV 86.2 01/21/2015   MCV 88.3 12/21/2014  MCV 88.1 09/30/2013   Lab Results  Component Value Date   TSH 1.00 03/28/2015   Lipid Panel     Component Value Date/Time   CHOL  06/16/2010 0401    191        ATP III CLASSIFICATION:  <200     mg/dL   Desirable  200-239  mg/dL   Borderline High  >=240    mg/dL   High          TRIG 109 06/16/2010 0401   HDL 53 06/16/2010 0401   CHOLHDL 3.6 06/16/2010 0401   VLDL 22 06/16/2010 0401   LDLCALC * 06/16/2010 0401    116        Total Cholesterol/HDL:CHD Risk Coronary Heart Disease Risk Table                     Men   Women  1/2 Average Risk   3.4   3.3  Average Risk       5.0   4.4  2 X Average Risk   9.6   7.1  3 X Average Risk  23.4   11.0        Use the calculated Patient Ratio above and the CHD Risk Table to determine the patient's CHD Risk.        ATP III CLASSIFICATION (LDL):  <100     mg/dL   Optimal  100-129  mg/dL   Near or Above                    Optimal  130-159  mg/dL   Borderline  160-189  mg/dL   High  >190     mg/dL   Very High     BMET    Component Value Date/Time   NA 138 01/21/2015 2246   K 3.5 01/21/2015 2246   CL 101 01/21/2015 2246   CO2 29 01/21/2015 2246   GLUCOSE 126* 01/21/2015 2246   BUN 19 01/21/2015 2246   CREATININE 0.62 01/21/2015 2246   CREATININE 0.58 02/14/2014 1502   CALCIUM 9.0 01/21/2015 2246   GFRNONAA 82* 01/21/2015 2246   GFRAA >90 01/21/2015 2246     Hepatic Function Panel     Component Value Date/Time   PROT 6.3 09/30/2013 1255   ALBUMIN 3.5 09/30/2013 1255   AST 13 09/30/2013 1255   ALT 11 09/30/2013 1255   ALKPHOS 75 09/30/2013 1255   BILITOT 0.6 09/30/2013 1255   BILIDIR 0.2 11/08/2012 2053   IBILI 0.9 11/08/2012 2053     CBC    Component Value Date/Time   WBC 7.6 01/21/2015 2246    RBC 5.08 01/21/2015 2246   HGB 14.1 01/21/2015 2246   HCT 43.8 01/21/2015 2246   PLT 196 01/21/2015 2246   MCV 86.2 01/21/2015 2246   MCH 27.8 01/21/2015 2246   MCHC 32.2 01/21/2015 2246   RDW 14.5 01/21/2015 2246   LYMPHSABS 1.8 09/30/2013 1255   MONOABS 0.8 09/30/2013 1255   EOSABS 0.2 09/30/2013 1255   BASOSABS 0.0 09/30/2013 1255     BNP No results found for: PROBNP  Lipid Panel     Component Value Date/Time   CHOL  06/16/2010 0401    191        ATP III CLASSIFICATION:  <200     mg/dL   Desirable  200-239  mg/dL   Borderline High  >=240    mg/dL   High  TRIG 109 06/16/2010 0401   HDL 53 06/16/2010 0401   CHOLHDL 3.6 06/16/2010 0401   VLDL 22 06/16/2010 0401   LDLCALC * 06/16/2010 0401    116        Total Cholesterol/HDL:CHD Risk Coronary Heart Disease Risk Table                     Men   Women  1/2 Average Risk   3.4   3.3  Average Risk       5.0   4.4  2 X Average Risk   9.6   7.1  3 X Average Risk  23.4   11.0        Use the calculated Patient Ratio above and the CHD Risk Table to determine the patient's CHD Risk.        ATP III CLASSIFICATION (LDL):  <100     mg/dL   Optimal  100-129  mg/dL   Near or Above                    Optimal  130-159  mg/dL   Borderline  160-189  mg/dL   High  >190     mg/dL   Very High     RADIOLOGY: No results found.    ASSESSMENT AND PLAN: Ms. Montone has a history of known CAD and is 6 years status post bare-metal stenting to her left circumflex coronary artery.  She had mild concomitant CAD involving the RCA.  She has a permanent pacemaker for sick sinus syndrome and has permanent atrial fibrillation on chronic warfarin therapy.  Since I last saw her, she had episodes of diarrhea and was tested positive for C. difficile for which she underwent successful treatment.  Her diarrhetic regimen had been reduced and ultimately discontinued.  She has underlying atrial fibrillation with a controlled ventricular rate.   Her pacemaker was recently interrogated for telephonically.  Her blood pressure today is stable.  She tells me she is changed primary care physicians to [facility] and will be having complete laboratory by them.     Her recent estimated battery longevity is 3.5-5 years on her pacemaker.  She's not having any anginal symptoms.  Her diarrhea has resolved.  I will ask that the results of the blood work be sent to our office for my review.  I will see her in 6 months for reevaluation.  Troy Sine, MD, [facility]  05/19/2015 6:43 PM

## 2015-05-26 ENCOUNTER — Encounter: Payer: Self-pay | Admitting: Cardiology

## 2015-05-26 ENCOUNTER — Encounter (INDEPENDENT_AMBULATORY_CARE_PROVIDER_SITE_OTHER): Payer: Self-pay | Admitting: Internal Medicine

## 2015-05-26 ENCOUNTER — Ambulatory Visit (INDEPENDENT_AMBULATORY_CARE_PROVIDER_SITE_OTHER): Payer: Medicare HMO | Admitting: Internal Medicine

## 2015-05-26 VITALS — BP 110/83 | HR 68 | Temp 97.5°F | Resp 18 | Ht 69.0 in | Wt 157.4 lb

## 2015-05-26 DIAGNOSIS — K589 Irritable bowel syndrome without diarrhea: Secondary | ICD-10-CM | POA: Diagnosis not present

## 2015-05-26 DIAGNOSIS — R634 Abnormal weight loss: Secondary | ICD-10-CM

## 2015-05-26 MED ORDER — DICYCLOMINE HCL 10 MG PO CAPS: ORAL_CAPSULE | ORAL | Status: DC

## 2015-05-26 NOTE — Progress Notes (Signed)
Presenting complaint;  Follow-up for abdominal pain and diarrhea.  Subjective:  Patient is 81 year old Caucasian female who is here for scheduled visit accompanied by her husband Dorinda Hill. She was last seen on 02/24/2015. She was treated for C. difficile prior to that visit. She had follow-up C. difficile GDH and toxin Aand B in April and these were negative. Patient states she is having on stools on most days. At times she has an urge to have a bowel movement but unable to do so until she pushes certain area and a premium. Every now and then she has cramping and diarrhea like she had yesterday with 3 bowel movements. She had another similar episode about a month ago. Since she has been using dicyclomine she's been having these episodes less frequently. He says her appetite is fair. She tries to eat healthy. She also tries to eat foods which are rich in fiber. She has lost 8 pounds since her last visit 3 months ago. She feels lonely. She states most of the weekdays she is alone at home. Her husband feels that looking after 19 cats is not helping her situation. Other day she fell while tending to her cats. When asked if she was depressed she says yes she is depressed. She will discuss her situation further with Dr. Leandrew Koyanagi who is her primary care physician. He is not having any side effects with dicyclomine.   Current Medications: Outpatient Encounter Prescriptions as of 05/26/2015  Medication Sig  . ALPRAZolam (XANAX) 0.25 MG tablet Take 1 tablet (0.25 mg total) by mouth 2 (two) times daily.  Marland Kitchen dicyclomine (BENTYL) 10 MG capsule 1 capsule by mouth twice daily as needed for abdominal pain or cramps.  . digoxin (LANOXIN) 0.125 MG tablet Take 1 tablet (0.125 mg total) by mouth daily.  . isosorbide mononitrate (IMDUR) 30 MG 24 hr tablet TAKE 1 TABLET BY MOUTH DAILY  . levothyroxine (SYNTHROID, LEVOTHROID) 100 MCG tablet TAKE 1 TABLET BY MOUTH EVERY DAY  . loperamide (IMODIUM) 2 MG capsule Take by mouth  as needed for diarrhea or loose stools.  . metoprolol (LOPRESSOR) 50 MG tablet TAKE 1 TABLET (50 MG TOTAL) BY MOUTH 2 (TWO) TIMES DAILY.  . traMADol (ULTRAM) 50 MG tablet Take 25-50 mg by mouth every 6 (six) hours as needed for pain.   Marland Kitchen warfarin (COUMADIN) 2.5 MG tablet TAKE 1 TABLET DAILY OR AS DIRECTED BY COUMADIN CLINIC (Patient taking differently: TAKE 1 WHOLE ON SATURDAY, SUNDAY, TUESDAY AND THURSDAY. TAKE 0.5 TABLET ON MONDAY, WEDNESDAY AND FRIDAY.)  . [DISCONTINUED] Probiotic Product (ALIGN PO) Take 1 tablet by mouth daily.    No facility-administered encounter medications on file as of 05/26/2015.     Objective: Blood pressure 110/83, pulse 68, temperature 97.5 F (36.4 C), temperature source Oral, resp. rate 18, height 5\' 9"  (1.753 m), weight 157 lb 6.4 oz (71.396 kg). Patient is alert and in no acute distress. She appears anxious. Conjunctiva is pink. Sclera is nonicteric Oropharyngeal mucosa is normal. No neck masses or thyromegaly noted. Cardiac exam with irregular rhythm normal S1 and S2. No murmur or gallop noted. Lungs are clear to auscultation. Abdomen is symmetrical. Bowel sounds are normal. On palpation abdomen is soft and nontender without organomegaly or masses. No LE edema or clubbing noted. Prominent first MCP joint in left hand is likely due to subluxation.   Assessment:  #1. Irritable bowel syndrome. She appears to be doing better with when necessary dicyclomine. Since she is still having intermittent spells of abdominal  cramps and diarrhea she might do better with daily dose of dicyclomine rather than when necessary. I wonder if she has a rectocele resulting in a more difficult defecation. #2. Weight loss. Patient has lost 8 pounds since her last visit 3 months ago. She appears to be on appropriate dose of levothyroxine TSH was 1.001 03/28/2015. Weight loss may be due to diminished intake secondary to depression.   Plan:  High fiber diet. Dicyclomine 10 mg  by mouth 30 minutes before breakfast daily and use second dose on as-needed basis. Follow with Dr. Leandrew Koyanagi regarding depression. Weight check in 8 weeks. Office visit in 6 months.

## 2015-05-26 NOTE — Patient Instructions (Addendum)
Dicyclomine 10 mg by mouth 30 minutes before breakfast every day and second dose only if you have abdominal cramping and diarrhea. High fiber diet. Weight check in 2 months.

## 2015-06-01 ENCOUNTER — Other Ambulatory Visit: Payer: Self-pay | Admitting: Family

## 2015-06-02 ENCOUNTER — Observation Stay (HOSPITAL_COMMUNITY)
Admission: EM | Admit: 2015-06-02 | Discharge: 2015-06-03 | Disposition: A | Discharge status: Home / Self Care | Payer: Medicare HMO | Attending: Cardiovascular Disease | Admitting: Cardiovascular Disease

## 2015-06-02 ENCOUNTER — Emergency Department (HOSPITAL_COMMUNITY): Payer: Medicare HMO

## 2015-06-02 ENCOUNTER — Encounter (HOSPITAL_COMMUNITY): Payer: Self-pay | Admitting: *Deleted

## 2015-06-02 DIAGNOSIS — M1990 Unspecified osteoarthritis, unspecified site: Secondary | ICD-10-CM | POA: Diagnosis not present

## 2015-06-02 DIAGNOSIS — Z950 Presence of cardiac pacemaker: Secondary | ICD-10-CM | POA: Diagnosis not present

## 2015-06-02 DIAGNOSIS — Z8673 Personal history of transient ischemic attack (TIA), and cerebral infarction without residual deficits: Secondary | ICD-10-CM | POA: Diagnosis not present

## 2015-06-02 DIAGNOSIS — Z8619 Personal history of other infectious and parasitic diseases: Secondary | ICD-10-CM | POA: Insufficient documentation

## 2015-06-02 DIAGNOSIS — Z79899 Other long term (current) drug therapy: Secondary | ICD-10-CM | POA: Diagnosis not present

## 2015-06-02 DIAGNOSIS — I4891 Unspecified atrial fibrillation: Secondary | ICD-10-CM | POA: Insufficient documentation

## 2015-06-02 DIAGNOSIS — I2510 Atherosclerotic heart disease of native coronary artery without angina pectoris: Secondary | ICD-10-CM | POA: Diagnosis not present

## 2015-06-02 DIAGNOSIS — E079 Disorder of thyroid, unspecified: Secondary | ICD-10-CM | POA: Diagnosis not present

## 2015-06-02 DIAGNOSIS — R079 Chest pain, unspecified: Secondary | ICD-10-CM | POA: Diagnosis not present

## 2015-06-02 DIAGNOSIS — R0789 Other chest pain: Secondary | ICD-10-CM | POA: Diagnosis present

## 2015-06-02 DIAGNOSIS — E119 Type 2 diabetes mellitus without complications: Secondary | ICD-10-CM | POA: Diagnosis not present

## 2015-06-02 DIAGNOSIS — I10 Essential (primary) hypertension: Secondary | ICD-10-CM | POA: Diagnosis not present

## 2015-06-02 DIAGNOSIS — F419 Anxiety disorder, unspecified: Secondary | ICD-10-CM | POA: Insufficient documentation

## 2015-06-02 DIAGNOSIS — Z7901 Long term (current) use of anticoagulants: Secondary | ICD-10-CM | POA: Diagnosis not present

## 2015-06-02 DIAGNOSIS — Z880 Allergy status to penicillin: Secondary | ICD-10-CM | POA: Insufficient documentation

## 2015-06-02 LAB — BASIC METABOLIC PANEL
Anion gap: 7 (ref 5–15)
BUN: 21 mg/dL — ABNORMAL HIGH (ref 6–20)
CALCIUM: 9 mg/dL (ref 8.9–10.3)
CHLORIDE: 99 mmol/L — AB (ref 101–111)
CO2: 32 mmol/L (ref 22–32)
CREATININE: 0.64 mg/dL (ref 0.44–1.00)
GFR calc Af Amer: >60 mL/min (ref >60)
Glucose, Bld: 97 mg/dL (ref 65–99)
Potassium: 4.4 mmol/L (ref 3.5–5.1)
Sodium: 138 mmol/L (ref 135–145)

## 2015-06-02 LAB — CBC
HEMATOCRIT: 41.5 % (ref 36.0–46.0)
Hemoglobin: 13.3 g/dL (ref 12.0–15.0)
MCH: 28.8 pg (ref 26.0–34.0)
MCHC: 32 g/dL (ref 30.0–36.0)
MCV: 89.8 fL (ref 78.0–100.0)
Platelets: 219 10*3/uL (ref 150–400)
RBC: 4.62 MIL/uL (ref 3.87–5.11)
RDW: 14.6 % (ref 11.5–15.5)
WBC: 7.8 10*3/uL (ref 4.0–10.5)

## 2015-06-02 LAB — PROTIME-INR
INR: 3.36 — ABNORMAL HIGH (ref 0.00–1.49)
Prothrombin Time: 33.3 seconds — ABNORMAL HIGH (ref 11.6–15.2)

## 2015-06-02 LAB — I-STAT TROPONIN, ED: Troponin i, poc: 0 ng/mL (ref 0.00–0.08)

## 2015-06-02 LAB — TROPONIN I: Troponin I: <0.03 ng/mL (ref <0.031)

## 2015-06-02 MED ORDER — NITROGLYCERIN 0.4 MG SL SUBL: 0.4000 mg | SUBLINGUAL_TABLET | SUBLINGUAL | Status: DC | PRN

## 2015-06-02 NOTE — ED Provider Notes (Signed)
CSN: [id number]     Arrival date & time 06/02/15  1712 History   First MD Initiated Contact with Patient 06/02/15 1721     Chief Complaint  Patient presents with  . Chest Pain     (Consider location/radiation/quality/duration/timing/severity/associated sxs/prior Treatment) HPI Comments: Patient's last cardiac catheterization was in June 2014  Patient is a 81 y.o. female presenting with chest pain. The history is provided by the patient.  Chest Pain Pain location:  L chest Pain quality: aching and pressure   Radiates to: Left axilla. Pain radiates to the back: no   Pain severity:  Moderate Onset quality:  Gradual Duration:  2 hours Timing:  Constant Progression:  Resolved Chronicity:  New Context comment:  Patient states about an hour after lunch today she developed intense pain in the left side of her breast and chest area Relieved by: Resolved after taking a tramadol and lying down. Worsened by:  Nothing tried Ineffective treatments:  None tried Associated symptoms: no abdominal pain, no cough, no diaphoresis, no dizziness, no fever, no lower extremity edema, no nausea, no palpitations, no shortness of breath, no syncope, not vomiting and no weakness   Risk factors: coronary artery disease and diabetes mellitus   Risk factors: no immobilization, no prior DVT/PE, no smoking and no surgery   Risk factors comment:  History of MI, coronary artery disease, cardiac stents in 2011 2009   Past Medical History  Diagnosis Date  . A-fib   . Stroke   . Pacemaker   . Arthritis   . Diabetes mellitus     x 10 yrs at least  . Anxiety   . Polio   . Hx of echocardiogram 09/2012    showed Ef 35-40% with no significant valve disease except for mild to moderate MR. normal RVSP and left atrium was severly dilated.  Marland Kitchen History of stress test 08/2011    negative for ischemia it was nongated and nondiagnostic and compatible with a possible scar or peri-infraction ischemia.   . Coronary artery  disease 12/2007  . Hypertension   . Thyroid disease     hypothyroidism   Past Surgical History  Procedure Laterality Date  . Cardiac surgery    . 3 cardiac stents      she has had non-DES stent to the mid circumflex in 2009 and LAD stent to the mid-distal LAD  BMS June 18, 2010  . Abdominal hysterectomy  1978  . Tonsillectomy    . Appendectomy    . Pacemaker placement  08/11/2010    Medtronic Adapta   . Coronary angioplasty    . Left heart catheterization with coronary angiogram N/A 05/10/2013    Procedure: LEFT HEART CATHETERIZATION WITH CORONARY ANGIOGRAM;  Surgeon: Lennette Bihari, MD;  Location: [facility] CATH LAB;  Service: Cardiovascular;  Laterality: N/A;  . Transthoracic echocardiogram  01/17/2009    EF=>55%.IMPAIRED LV RELAXATION.LA IS MODERATELY DILATED.RA IS MILDLY DILATED. RV SYSTOLIC PRESSURE IS ELEVATED AT 30-40MMHG.MILD TO MODERATE TR.MILD MR.SINCE PRIOR TTE,BOTH ATRIA APPEAR TO BE MORE DILATED.  . Stress perfusion test  10/22/2009    MILD ISCHEMIA IN THE BASAL ANTEROLATERAL AND MID ANTEROLATERAL REGIONS.EF 52%.   Family History  Problem Relation Age of Onset  . Heart disease Father   . Cancer Mother    History  Substance Use Topics  . Smoking status: Never Smoker   . Smokeless tobacco: Never Used  . Alcohol Use: No   OB History    No data available  Review of Systems  Constitutional: Negative for fever and diaphoresis.  Respiratory: Negative for cough and shortness of breath.   Cardiovascular: Positive for chest pain. Negative for palpitations and syncope.  Gastrointestinal: Negative for nausea, vomiting and abdominal pain.  Neurological: Negative for dizziness and weakness.  All other systems reviewed and are negative.     Allergies  Iohexol; Penicillins; and Sulfur  Home Medications   Prior to Admission medications   Medication Sig Start Date End Date Taking? Authorizing Provider  ALPRAZolam (XANAX) 0.25 MG tablet Take 1 tablet (0.25 mg total) by  mouth 2 (two) times daily. 03/28/15   Veryl Speak, FNP  dicyclomine (BENTYL) 10 MG capsule 1 capsule by mouth twice daily as needed for abdominal pain or cramps. 05/26/15   Malissa Hippo, MD  digoxin (LANOXIN) 0.125 MG tablet Take 1 tablet (0.125 mg total) by mouth daily. 12/30/14   Lennette Bihari, MD  isosorbide mononitrate (IMDUR) 30 MG 24 hr tablet TAKE 1 TABLET BY MOUTH DAILY 01/02/15   Lennette Bihari, MD  levothyroxine (SYNTHROID, LEVOTHROID) 100 MCG tablet TAKE 1 TABLET BY MOUTH EVERY DAY 04/08/15   Malissa Hippo, MD  loperamide (IMODIUM) 2 MG capsule Take by mouth as needed for diarrhea or loose stools.    Historical Provider, MD  metoprolol (LOPRESSOR) 50 MG tablet TAKE 1 TABLET (50 MG TOTAL) BY MOUTH 2 (TWO) TIMES DAILY. 12/02/14   Lennette Bihari, MD  traMADol (ULTRAM) 50 MG tablet Take 25-50 mg by mouth every 6 (six) hours as needed for pain.     Historical Provider, MD  warfarin (COUMADIN) 2.5 MG tablet TAKE 1 TABLET DAILY OR AS DIRECTED BY COUMADIN CLINIC Patient taking differently: TAKE 1 WHOLE ON SATURDAY, SUNDAY, TUESDAY AND THURSDAY. TAKE 0.5 TABLET ON MONDAY, WEDNESDAY AND FRIDAY. 11/04/14   Lennette Bihari, MD   BP 134/59 mmHg  Pulse 60  Temp(Src) 97.9 F (36.6 C) (Oral)  Resp 20  Ht 5' 9.5" (1.765 m)  Wt 157 lb (71.215 kg)  BMI 22.86 kg/m2  SpO2 98% Physical Exam  Constitutional: She is oriented to person, place, and time. She appears well-developed and well-nourished. No distress.  HENT:  Head: Normocephalic and atraumatic.  Mouth/Throat: Oropharynx is clear and moist.  Eyes: Conjunctivae and EOM are normal. Pupils are equal, round, and reactive to light.  Neck: Normal range of motion. Neck supple.  Cardiovascular: Normal rate, regular rhythm and intact distal pulses.   No murmur heard. Pulmonary/Chest: Effort normal and breath sounds normal. No respiratory distress. She has no wheezes. She has no rales.  Pacemaker located in the left upper chest. No reproducible pain  with chest or breast palpation  Abdominal: Soft. She exhibits no distension. There is no tenderness. There is no rebound and no guarding.  Musculoskeletal: Normal range of motion. She exhibits no edema or tenderness.  Neurological: She is alert and oriented to person, place, and time.  Skin: Skin is warm and dry. No rash noted. No erythema.  Psychiatric: She has a normal mood and affect. Her behavior is normal.  Nursing note and vitals reviewed.   ED Course  Procedures (including critical care time) Labs Review Labs Reviewed  BASIC METABOLIC PANEL - Abnormal; Notable for the following:    Chloride 99 (*)    BUN 21 (*)    All other components within normal limits  PROTIME-INR - Abnormal; Notable for the following:    Prothrombin Time 33.3 (*)    INR 3.36 (*)  All other components within normal limits  CBC  I-STAT TROPOININ, ED    Imaging Review Dg Chest Port 1 View  06/02/2015   CLINICAL DATA:  Acute onset of generalized chest pain. Initial encounter.  EXAM: PORTABLE CHEST - 1 VIEW  COMPARISON:  Chest radiograph performed 01/21/2015  FINDINGS: The lungs are well-aerated. The costophrenic angles are incompletely imaged on this study. There is no evidence of focal opacification, pleural effusion or pneumothorax. An accessory azygos lobe is incidentally noted.  The cardiomediastinal silhouette is mildly enlarged. A pacemaker is noted overlying the left chest wall, with a single lead ending overlying the right ventricle. No acute osseous abnormalities are seen.  IMPRESSION: Mild cardiomegaly.  Lungs remain grossly clear.   Electronically Signed   By: Roanna Raider M.D.   On: 06/02/2015 17:44     EKG Interpretation   Date/Time:  Monday June 02 2015 17:17:29 EDT Ventricular Rate:  60 PR Interval:    QRS Duration: 162 QT Interval:  454 QTC Calculation: 454 R Axis:   119 Text Interpretation:  Ventricular-paced rhythm No significant change since  last tracing Confirmed by Anitra Lauth   MD, Alphonzo Lemmings ([id number]) on 06/02/2015  5:22:14 PM      MDM   Final diagnoses:  None    Patient with a history of A. fib, stroke, pacemaker placement, diabetes, cardiac stents in 2009 and 2011 who presents with an hour half of left-sided chest pain that started today around 1:00. She had no associated symptoms with the pain and after she took a tramadol and laid down the pain resolved. She is currently pain-free. She denies any recent URI symptoms, medication changes, fever, abdominal pain, nausea or vomiting. She recently lost 8 pounds but otherwise has no significant change in her medical history. Patient currently is taking Coumadin.  Patient denies palpitations or near syncope today. She is well-appearing on exam no signs of rash or evidence for shingles. She is not currently having chest pain. Concern that this could be an ACS presentation. Low suspicion for PE at this time. EKG shows a ventricularly paced rhythm without acute change.  CBC, BMP, troponin, INR, chest x-ray pending  7:37 PM Initial labs and chest x-ray are within normal limits except for an INR is therapeutic at 3.5. Patient remains chest pain-free. We'll discuss with cardiology for further recommendation  8:11 PM Patient will be admitted to cardiology for further evaluation  Gwyneth Sprout, MD 06/02/15 2011

## 2015-06-02 NOTE — ED Notes (Signed)
Pt sts she took a nap.  Pt sts she was woken up from her nap to a "twinge" in the same spot as earlier in her chest, but sts it only lasted 2-3 minutes.  Pt denies any pain at this time.

## 2015-06-02 NOTE — ED Notes (Signed)
Pt c/o left sided CP starting at noon today. Reports hx of 2 MI's. States she took Tramadol for pain.

## 2015-06-02 NOTE — H&P (Signed)
Primary Cardiologist: Dr. Nicki Guadalajara -- recent visit 05/19/2015  CC: CP  HPI: 81 y.o. Female presents to the ER for evaluation. She has CAD s/p BMS to LCX in 2009 and also had mild 30-40% RCA stenosis and a normal LAD, permanent AF on warfarin for primary stroke prevention, hypothyroidism, s/p post single chamber Medtronic permanent pacemaker for SSS, mild to moderate LV systolic dysfunction with LVEF 35-40%, severe LA dilatation, had Cardiolite rest/stress MPI in 04/2013 which revealed Small region of ischemia in the anterior wall, septal hypokinesia and LVEF 52%.   She reports that after she cooked a big meal in the afternoon, she developed left sided chest pain, moderate to severe, lasted for over an hour. Itw as located near the left arm pit and along the left breast. It was nagging deep dull ache radiating to her left arm. Not associated with SOB or diaphoresis. No syncope, orthopnea, PND, edema. This pain was different and more severe than her previous episodes. She took half tablet of tramadol with eventual relief of her pain.   She does report episodes of breast pain in the past and feels there may be a nodule in the superior aspect of her left breast.   At this time, feels a lot better and CP free.    Review of Systems:  10 systems reviewed unremarkable except as noted in my HPI     Past Medical History  Diagnosis Date  . A-fib   . Stroke   . Pacemaker   . Arthritis   . Diabetes mellitus     x 10 yrs at least  . Anxiety   . Polio   . Hx of echocardiogram 09/2012    showed Ef 35-40% with no significant valve disease except for mild to moderate MR. normal RVSP and left atrium was severly dilated.  Marland Kitchen History of stress test 08/2011    negative for ischemia it was nongated and nondiagnostic and compatible with a possible scar or peri-infraction ischemia.   . Coronary artery disease 12/2007  . Hypertension   . Thyroid disease     hypothyroidism    Medications Prior to  Admission  Medication Sig Dispense Refill  . ALPRAZolam (XANAX) 0.25 MG tablet Take 1 tablet (0.25 mg total) by mouth 2 (two) times daily. (Patient taking differently: Take 0.25 mg by mouth at bedtime. ) 60 tablet 0  . dicyclomine (BENTYL) 10 MG capsule 1 capsule by mouth twice daily as needed for abdominal pain or cramps. 60 capsule 5  . digoxin (LANOXIN) 0.125 MG tablet Take 1 tablet (0.125 mg total) by mouth daily. 30 tablet 6  . isosorbide mononitrate (IMDUR) 30 MG 24 hr tablet TAKE 1 TABLET BY MOUTH DAILY 30 tablet 11  . levothyroxine (SYNTHROID, LEVOTHROID) 100 MCG tablet TAKE 1 TABLET BY MOUTH EVERY DAY 30 tablet 5  . loperamide (IMODIUM) 2 MG capsule Take 1 mg by mouth daily as needed for diarrhea or loose stools.     . metoprolol (LOPRESSOR) 50 MG tablet TAKE 1 TABLET (50 MG TOTAL) BY MOUTH 2 (TWO) TIMES DAILY. 60 tablet 11  . traMADol (ULTRAM) 50 MG tablet Take 25-50 mg by mouth every 6 (six) hours as needed for pain.     Marland Kitchen warfarin (COUMADIN) 2.5 MG tablet TAKE 1 TABLET DAILY OR AS DIRECTED BY COUMADIN CLINIC 30 tablet 5  . warfarin (COUMADIN) 2.5 MG tablet Take 1.25-2.5 mg by mouth daily. Take half of the 2.5mg  tablet on Mon, Wed and  fridays. Other days take a whole tablet       Allergies  Allergen Reactions  . Iohexol Other (See Comments)     Desc: CHEST PAIN, LOC AFTER HEART CATH, PT REFUSED DYE   . Penicillins Other (See Comments)    REACTION:  unknown  . Sulfur Other (See Comments)    REACTION:  unknown    History   Social History  . Marital Status: Married    Spouse Name: N/A  . Number of Children: N/A  . Years of Education: N/A   Occupational History  . Not on file.   Social History Main Topics  . Smoking status: Never Smoker   . Smokeless tobacco: Never Used  . Alcohol Use: No  . Drug Use: No  . Sexual Activity: Not on file   Other Topics Concern  . Not on file   Social History Narrative    Family History  Problem Relation Age of Onset  . Heart  disease Father   . Cancer Mother    No current facility-administered medications on file prior to encounter.   Current Outpatient Prescriptions on File Prior to Encounter  Medication Sig Dispense Refill  . ALPRAZolam (XANAX) 0.25 MG tablet Take 1 tablet (0.25 mg total) by mouth 2 (two) times daily. (Patient taking differently: Take 0.25 mg by mouth at bedtime. ) 60 tablet 0  . dicyclomine (BENTYL) 10 MG capsule 1 capsule by mouth twice daily as needed for abdominal pain or cramps. 60 capsule 5  . digoxin (LANOXIN) 0.125 MG tablet Take 1 tablet (0.125 mg total) by mouth daily. 30 tablet 6  . isosorbide mononitrate (IMDUR) 30 MG 24 hr tablet TAKE 1 TABLET BY MOUTH DAILY 30 tablet 11  . levothyroxine (SYNTHROID, LEVOTHROID) 100 MCG tablet TAKE 1 TABLET BY MOUTH EVERY DAY 30 tablet 5  . loperamide (IMODIUM) 2 MG capsule Take 1 mg by mouth daily as needed for diarrhea or loose stools.     . metoprolol (LOPRESSOR) 50 MG tablet TAKE 1 TABLET (50 MG TOTAL) BY MOUTH 2 (TWO) TIMES DAILY. 60 tablet 11  . traMADol (ULTRAM) 50 MG tablet Take 25-50 mg by mouth every 6 (six) hours as needed for pain.     Marland Kitchen warfarin (COUMADIN) 2.5 MG tablet TAKE 1 TABLET DAILY OR AS DIRECTED BY COUMADIN CLINIC 30 tablet 5      PHYSICAL EXAM: Filed Vitals:   06/02/15 2134  BP: 157/125  Pulse: 61  Temp: 97.7 F (36.5 C)  Resp: 19     Intake/Output Summary (Last 24 hours) at 06/02/15 2335 Last data filed at 06/02/15 2136  Gross per 24 hour  Intake      0 ml  Output      0 ml  Net      0 ml    General:  Well appearing. No respiratory difficulty HEENT: normal Neck: supple. no JVD. Carotids 2+ bilat; no bruits. No lymphadenopathy or thryomegaly appreciated. Cor: PMI nondisplaced. Regular rate & rhythm. No rubs, gallops or murmurs. Lungs: clear Left breast: there is a small firm nodular mass with ill defined margins in the left superior quadrant of left breast with tenderness to palpation  Abdomen: soft,  nontender, nondistended. No hepatosplenomegaly. No bruits or masses. Good bowel sounds. Extremities: no cyanosis, clubbing, rash, edema Neuro: alert & oriented x 3, cranial nerves grossly intact. moves all 4 extremities w/o difficulty. Affect pleasant.  ECG: AF, V paced at 60 bpm   Results for orders placed or performed  during the hospital encounter of 06/02/15 (from the past 24 hour(s))  CBC     Status: None   Collection Time: 06/02/15  5:25 PM  Result Value Ref Range   WBC 7.8 4.0 - 10.5 K/uL   RBC 4.62 3.87 - 5.11 MIL/uL   Hemoglobin 13.3 12.0 - 15.0 g/dL   HCT 95.2 84.1 - 32.4 %   MCV 89.8 78.0 - 100.0 fL   MCH 28.8 26.0 - 34.0 pg   MCHC 32.0 30.0 - 36.0 g/dL   RDW 40.1 02.7 - 25.3 %   Platelets 219 150 - 400 K/uL  Basic metabolic panel     Status: Abnormal   Collection Time: 06/02/15  5:25 PM  Result Value Ref Range   Sodium 138 135 - 145 mmol/L   Potassium 4.4 3.5 - 5.1 mmol/L   Chloride 99 (L) 101 - 111 mmol/L   CO2 32 22 - 32 mmol/L   Glucose, Bld 97 65 - 99 mg/dL   BUN 21 (H) 6 - 20 mg/dL   Creatinine, Ser 6.64 0.44 - 1.00 mg/dL   Calcium 9.0 8.9 - 40.3 mg/dL   GFR calc non Af Amer >60 >60 mL/min   GFR calc Af Amer >60 >60 mL/min   Anion gap 7 5 - 15  Protime-INR     Status: Abnormal   Collection Time: 06/02/15  5:25 PM  Result Value Ref Range   Prothrombin Time 33.3 (H) 11.6 - 15.2 seconds   INR 3.36 (H) 0.00 - 1.49  I-stat troponin, ED  (not at [facility], [facility])     Status: None   Collection Time: 06/02/15  5:35 PM  Result Value Ref Range   Troponin i, poc 0.00 0.00 - 0.08 ng/mL   Comment 3          Troponin I     Status: None   Collection Time: 06/02/15  8:41 PM  Result Value Ref Range   Troponin I <0.03 <0.031 ng/mL   Dg Chest Port 1 View  06/02/2015   CLINICAL DATA:  Acute onset of generalized chest pain. Initial encounter.  EXAM: PORTABLE CHEST - 1 VIEW  COMPARISON:  Chest radiograph performed 01/21/2015  FINDINGS: The lungs are well-aerated. The costophrenic  angles are incompletely imaged on this study. There is no evidence of focal opacification, pleural effusion or pneumothorax. An accessory azygos lobe is incidentally noted.  The cardiomediastinal silhouette is mildly enlarged. A pacemaker is noted overlying the left chest wall, with a single lead ending overlying the right ventricle. No acute osseous abnormalities are seen.  IMPRESSION: Mild cardiomegaly.  Lungs remain grossly clear.   Electronically Signed   By: Roanna Raider M.D.   On: 06/02/2015 17:44     ASSESSMENT:  1. Atypical left sided CP - she seems to have two components - one is more deep dull ache and one is superficial breast pain - stable hemodynamically  - No evidence of MI on initial testing  - known CAD with history of PCI   2. Permanent AF  3. Medtronic single chamber VVI PPM in situ with V paced rhythm on ECG     PLAN/DISCUSSION:  - admit to cardiology for observation to rule out - once ruled out, then pharmacologic stress test - Low dose ASA - continue metoprolol - start high potency statin - low dose - Continue Dig and Warfarin for AF   Nevin Bloodgood, MD Cardiology

## 2015-06-02 NOTE — ED Notes (Addendum)
MD at bedside. 

## 2015-06-02 NOTE — ED Notes (Signed)
X-ray at bedside

## 2015-06-03 ENCOUNTER — Observation Stay (HOSPITAL_COMMUNITY): Payer: Medicare HMO

## 2015-06-03 DIAGNOSIS — R0789 Other chest pain: Secondary | ICD-10-CM

## 2015-06-03 DIAGNOSIS — R079 Chest pain, unspecified: Secondary | ICD-10-CM | POA: Diagnosis not present

## 2015-06-03 LAB — NM MYOCAR SINGLE W/SPECT
CHL CUP NUCLEAR SDS: 3
LV dias vol: 97 mL
LV sys vol: 47 mL
NUC STRESS TID: 1.13
RATE: 0.35
SRS: 4
SSS: 7

## 2015-06-03 LAB — PROTIME-INR
INR: 2.58 — AB (ref 0.00–1.49)
Prothrombin Time: 27.3 seconds — ABNORMAL HIGH (ref 11.6–15.2)

## 2015-06-03 LAB — TSH: TSH: 2.976 u[IU]/mL (ref 0.350–4.500)

## 2015-06-03 LAB — TROPONIN I

## 2015-06-03 LAB — LIPID PANEL
CHOL/HDL RATIO: 3.5 ratio
Cholesterol: 173 mg/dL (ref 0–200)
HDL: 50 mg/dL (ref >40)
LDL CALC: 104 mg/dL — AB (ref 0–99)
TRIGLYCERIDES: 94 mg/dL (ref <150)
VLDL: 19 mg/dL (ref 0–40)

## 2015-06-03 LAB — MAGNESIUM: Magnesium: 2 mg/dL (ref 1.7–2.4)

## 2015-06-03 LAB — DIGOXIN LEVEL: Digoxin Level: 0.5 ng/mL — ABNORMAL LOW (ref 0.8–2.0)

## 2015-06-03 MED ORDER — WARFARIN SODIUM 2.5 MG PO TABS: 1.2500 mg | ORAL_TABLET | Freq: Every day | ORAL | Status: DC

## 2015-06-03 MED ORDER — REGADENOSON 0.4 MG/5ML IV SOLN
0.4000 mg | Freq: Once | INTRAVENOUS | Status: DC
  Filled 2015-06-03: qty 5

## 2015-06-03 MED ORDER — TRAMADOL HCL 50 MG PO TABS: 25.0000 mg | ORAL_TABLET | Freq: Four times a day (QID) | ORAL | Status: DC | PRN

## 2015-06-03 MED ORDER — ASPIRIN 300 MG RE SUPP: 300.0000 mg | RECTAL | Status: DC

## 2015-06-03 MED ORDER — ATORVASTATIN CALCIUM 20 MG PO TABS: 20.0000 mg | ORAL_TABLET | Freq: Every day | ORAL | Status: DC

## 2015-06-03 MED ORDER — ONDANSETRON HCL 4 MG/2ML IJ SOLN: 4.0000 mg | Freq: Four times a day (QID) | INTRAMUSCULAR | Status: DC | PRN

## 2015-06-03 MED ORDER — DIGOXIN 125 MCG PO TABS
0.1250 mg | ORAL_TABLET | Freq: Every day | ORAL | Status: DC
  Administered 2015-06-03: 0.125 mg via ORAL
  Filled 2015-06-03: qty 1

## 2015-06-03 MED ORDER — REGADENOSON 0.4 MG/5ML IV SOLN
INTRAVENOUS | Status: AC
  Administered 2015-06-03: 0.4 mg via INTRAVENOUS
  Filled 2015-06-03: qty 5

## 2015-06-03 MED ORDER — SODIUM CHLORIDE 0.9 % IJ SOLN
80.0000 mg | INTRAVENOUS | Status: AC
  Administered 2015-06-03: 80 mg via INTRAVENOUS
  Filled 2015-06-03: qty 3.2

## 2015-06-03 MED ORDER — TECHNETIUM TC 99M SESTAMIBI GENERIC - CARDIOLITE
30.0000 | Freq: Once | INTRAVENOUS | Status: AC | PRN
  Administered 2015-06-03: 30 via INTRAVENOUS

## 2015-06-03 MED ORDER — WARFARIN SODIUM 2.5 MG PO TABS: 2.5000 mg | ORAL_TABLET | ORAL | Status: DC

## 2015-06-03 MED ORDER — ASPIRIN EC 81 MG PO TBEC
81.0000 mg | DELAYED_RELEASE_TABLET | Freq: Every day | ORAL | Status: DC
  Filled 2015-06-03: qty 1

## 2015-06-03 MED ORDER — NITROGLYCERIN 0.4 MG SL SUBL: 0.4000 mg | SUBLINGUAL_TABLET | SUBLINGUAL | Status: DC | PRN

## 2015-06-03 MED ORDER — TECHNETIUM TC 99M SESTAMIBI GENERIC - CARDIOLITE
10.0000 | Freq: Once | INTRAVENOUS | Status: AC | PRN
  Administered 2015-06-03: 10 via INTRAVENOUS

## 2015-06-03 MED ORDER — WARFARIN - PHYSICIAN DOSING INPATIENT: Freq: Every day | Status: DC

## 2015-06-03 MED ORDER — ALPRAZOLAM 0.25 MG PO TABS
0.2500 mg | ORAL_TABLET | Freq: Every day | ORAL | Status: DC
  Administered 2015-06-03: 0.25 mg via ORAL
  Filled 2015-06-03: qty 1

## 2015-06-03 MED ORDER — ASPIRIN 81 MG PO CHEW
324.0000 mg | CHEWABLE_TABLET | ORAL | Status: DC
Start: 2015-06-03 — End: 2015-06-03

## 2015-06-03 MED ORDER — ACETAMINOPHEN 325 MG PO TABS: 650.0000 mg | ORAL_TABLET | ORAL | Status: DC | PRN

## 2015-06-03 MED ORDER — REGADENOSON 0.4 MG/5ML IV SOLN
0.4000 mg | Freq: Once | INTRAVENOUS | Status: AC
  Administered 2015-06-03: 0.4 mg via INTRAVENOUS
  Filled 2015-06-03: qty 5

## 2015-06-03 MED ORDER — ISOSORBIDE MONONITRATE ER 30 MG PO TB24
30.0000 mg | ORAL_TABLET | Freq: Every day | ORAL | Status: DC
  Administered 2015-06-03: 30 mg via ORAL
  Filled 2015-06-03: qty 1

## 2015-06-03 MED ORDER — WARFARIN SODIUM 2.5 MG PO TABS: 1.2500 mg | ORAL_TABLET | ORAL | Status: DC

## 2015-06-03 MED ORDER — METOPROLOL TARTRATE 50 MG PO TABS
50.0000 mg | ORAL_TABLET | Freq: Two times a day (BID) | ORAL | Status: DC
  Administered 2015-06-03: 50 mg via ORAL
  Filled 2015-06-03 (×3): qty 1

## 2015-06-03 MED ORDER — LEVOTHYROXINE SODIUM 100 MCG PO TABS
100.0000 ug | ORAL_TABLET | Freq: Every day | ORAL | Status: DC
  Administered 2015-06-03: 100 ug via ORAL
  Filled 2015-06-03: qty 1

## 2015-06-03 NOTE — Progress Notes (Signed)
Lexiscan MV performed, 1 day study. 

## 2015-06-03 NOTE — Progress Notes (Signed)
Patient Name: [name] Date of Encounter: 06/03/2015  Active Problems:   Chest pain   Atypical chest pain  SUBJECTIVE  Feels better. Denies chest pain, sob or palpation. She said that she fell last week, did not recall which side, however she had a sore shoulder, neck and leg for few days.   CURRENT MEDS . ALPRAZolam  0.25 mg Oral QHS  . aspirin  324 mg Oral NOW   Or  . aspirin  300 mg Rectal NOW  . [START ON 06/04/2015] aspirin EC  81 mg Oral Daily  . atorvastatin  20 mg Oral q1800  . digoxin  0.125 mg Oral Daily  . isosorbide mononitrate  30 mg Oral Daily  . levothyroxine  100 mcg Oral QAC breakfast  . metoprolol  50 mg Oral BID  . regadenoson  0.4 mg Intravenous Once  . [START ON 06/04/2015] warfarin  1.25 mg Oral Q M,W,F-1800  . warfarin  2.5 mg Oral Q T,Th,S,Su-1800  . Warfarin - Physician Dosing Inpatient   Does not apply q1800    OBJECTIVE  Filed Vitals:   06/02/15 2030 06/02/15 2134 06/03/15 0240 06/03/15 0534  BP: 154/79 157/125 155/68 151/85  Pulse: 51 61  71  Temp:  97.7 F (36.5 C)  97.7 F (36.5 C)  TempSrc:  Oral  Oral  Resp: 17 19    Height:   (1.753 m)    Weight:  159 lb 6.4 oz (72.303 kg)    SpO2: 100% 98%  96%    Intake/Output Summary (Last 24 hours) at 06/03/15 0743 Last data filed at 06/02/15 2136  Gross per 24 hour  Intake      0 ml  Output      0 ml  Net      0 ml   Filed Weights   06/02/15 1718 06/02/15 2134  Weight: 157 lb (71.215 kg) 159 lb 6.4 oz (72.303 kg)    PHYSICAL EXAM  General: Pleasant, NAD. Neuro: Alert and oriented X 3. Moves all extremities spontaneously. Psych: Normal affect. HEENT:  Normal  Neck: Supple without bruits or JVD. Lungs:  Resp regular and unlabored, CTA. Heart: irregular,  no s3, s4, or murmurs. Left breast: Small firm nodular mass with ill defined margins in the left superior quadrant of left breast with TTP Abdomen: Soft, non-tender, non-distended, BS + x 4.  Extremities: No clubbing,  cyanosis or edema. DP/PT/Radials 2+ and equal bilaterally.  Accessory Clinical Findings  CBC  Recent Labs  06/02/15 1725  WBC 7.8  HGB 13.3  HCT 41.5  MCV 89.8  PLT 219   Basic Metabolic Panel  Recent Labs  06/02/15 1725 06/03/15 0216  NA 138  --   K 4.4  --   CL 99*  --   CO2 32  --   GLUCOSE 97  --   BUN 21*  --   CREATININE 0.64  --   CALCIUM 9.0  --   MG  --  2.0   Cardiac Enzymes  Recent Labs  06/02/15 2041 06/03/15 0216  TROPONINI <0.03 <0.03    Recent Labs  06/03/15 0216  CHOL 173  HDL 50  LDLCALC 104*  TRIG 94  CHOLHDL 3.5   Thyroid Function Tests  Recent Labs  06/03/15 0216  TSH 2.976    TELE  afib at rate of 60s. V paced.   Radiology/Studies Dg Chest Port 1 View  06/02/2015   CLINICAL DATA:  Acute onset of generalized chest  pain. Initial encounter.  EXAM: PORTABLE CHEST - 1 VIEW  COMPARISON:  Chest radiograph performed 01/21/2015  FINDINGS: The lungs are well-aerated. The costophrenic angles are incompletely imaged on this study. There is no evidence of focal opacification, pleural effusion or pneumothorax. An accessory azygos lobe is incidentally noted.  The cardiomediastinal silhouette is mildly enlarged. A pacemaker is noted overlying the left chest wall, with a single lead ending overlying the right ventricle. No acute osseous abnormalities are seen.  IMPRESSION: Mild cardiomegaly.  Lungs remain grossly clear.   Electronically Signed   By: Roanna Raider M.D.   On: 06/02/2015 17:44    ASSESSMENT AND PLAN    1. Atypical left sided chest pain - Trop x 2 negative. TSH normal. Myoview today. Asymptomatic.  - she reports fall last week while chasing cat, however did not seek medical attention. Pain improved on Tramadol. Likely MSK in nature. She also have L breast nodule and TTP. If negative myoview, plan with f/u with PCP for further evaluation. - Continue ASA, statin, imdur, BB,   2. Permanent Afib - Continue digoxin and warfarin -  Digoxin level 0.5  3. HTN - BP in range of 150s/80s. Liklely due to pain. Her BP was normal when seen in clinic by Dr. Tresa Endo 05/19/15. Will continue to monitor.   Lorelei Pont PA-C Pager [phone]

## 2015-06-03 NOTE — Discharge Summary (Signed)
Discharge Summary   Patient ID: [name],  MRN: [id number], DOB/AGE: 1930/04/08 81 y.o.  Admit date: 06/02/2015 Discharge date: 06/03/2015  Primary Care Provider: No PCP Per Patient Primary Cardiologist: Dr. Tresa Endo  Discharge Diagnoses Active Problems:   Chest pain   Atypical chest pain   Allergies Allergies  Allergen Reactions  . Iohexol Other (See Comments)     Desc: CHEST PAIN, LOC AFTER HEART CATH, PT REFUSED DYE   . Penicillins Other (See Comments)    REACTION:  unknown  . Sulfur Other (See Comments)    REACTION:  unknown    Procedures  NM Myocar Single W/Spect W/Wall Motion  06/03/15    Nuclear Stress Findings     Isotope administration Rest isotope was administered with an IV injection of 10 mCi Tc91m Sestamibi. Rest SPECT images were obtained approximately 45 minutes post tracer injection. Stress isotope was administered with an IV injection of 30 mCi Tc30m Sestamibi 5 seconds post IV Lexiscan administration. Stress SPECT images were obtained approximately 30 minutes post tracer injection.    Nuclear Study Quality Breast attenuation artifact was present and diaphragmatic attenuation artifact was present. Image quality affected due to patient motion.    Nuclear Measurements Study was gated.    Rest Perfusion There is a defect present in the mid anterior and apical anterior location.There is a minimal amount of attenuation involving apical aspect of the anterior wall left ventricle which improves on the provided stress images. There is no definitive scintigraphic evidence of prior infarction or pharmacologically induced ischemia.    Stress Perfusion There is a defect present in the mid anterior and apical anterior location.There is a minimal amount of attenuation involving apical aspect of the anterior wall left ventricle which improves on the provided stress images. There is no definitive scintigraphic evidence of prior infarction or pharmacologically  induced ischemia.    Perfusion Summary Defect 1:  There is a defect of mild severity present in the mid anterior and apical anterior location. The defect is consistent with artifact.    Study Impression Myocardial perfusion is normal. The study is normal. This is a low risk study. Overall left ventricular systolic function was abnormal. LV cavity size is normal. Nuclear stress EF: 52%. The left ventricular ejection fraction is mildly decreased (45-54%). A prior study was conducted on 05/09/2013.Compared to the prior study, there are changes.1. No definitive scintigraphic evidence of prior infarction or pharmacologically induced ischemia. 2. Mild global hypokinesia without geographic wall motion abnormality. 3. Left ventricular ejection fraction - 52%       History of Present Illness  81 y.o. Female with a history of CAD s/p BMS to LCX in 2009 and also had mild 30-40% RCA stenosis and a normal LAD, permanent AF on warfarin for primary stroke prevention, hypothyroidism, s/p post single chamber Medtronic permanent pacemaker for SSS presented 7/4  to ER for evaluation of chest pain.    Echo 09/2015 showed mild to moderate LV systolic dysfunction with LVEF 35-40%, severe LA dilatation, had Cardiolite rest/stress MPI in 04/2013 which revealed Small region of ischemia in the anterior wall, septal hypokinesia and LVEF 52%. A followup echo Doppler study on 06/25/2014 revealed an EF of 60-65%. There was severe biatrial enlargement. There was evidence for mitral annular calcification with trivial MR.  She reports that after she cooked a big meal in the afternoon 06/02/15, she developed left sided chest pain, moderate to severe, lasted for over an hour. It  aas located near the left  arm pit and along the left breast. It was nagging deep dull ache radiating to her left arm. Not associated with SOB or diaphoresis. No syncope, orthopnea, PND, edema. This pain was different and more severe than her previous  episodes. She took half tablet of tramadol with eventual relief of her pain.  She does report episodes of breast pain in the past and feels there may be a nodule in the superior aspect of her left breast.   In ED, she felt lot better and CP free. She was admitted for observation to r/o ACS. EKG without acute abnormality.   Hospital Course  She was started on low dose ASA and statin. Her medication was continued. On physical exam she noted to have small firm nodular mass with ill defined margins in the left superior quadrant of left breast with tenderness to palpation.  Overnight her did not had any chest pain. Patient reported that she did fell last week prior to admission however she was unable to recall which side. She did have a sore shoulder, neck and leg pain for few days after the fall. Pain improved on Tramadol as outpatient. Trop X 3 were negative. TSH was normal. Myocardial perfusion study was low risk study. Overall left ventricular systolic function was abnormal. LV cavity size is normal. Nuclear stress EF: 52%. The left ventricular ejection fraction is mildly decreased (45-54%). A prior study was conducted on 05/09/2013.Compared to the prior study, there are changes.1. No definitive scintigraphic evidence of prior infarction or pharmacologically induced ischemia; mild global hypokinesia without geographic wall motion abnormality; left ventricular ejection fraction - 52%. She was chest pain free during admission. She was discharged stably and advice to f/u with PCP for evaluation of MSK pain and Left breast nodule. Her husband thinks it is stress-related. She was advised to resume home dose of coumadin and f/u with coumadin clinic as scheduled. 06/03/2015: Cholesterol 173; HDL 50; LDL Cholesterol 104*; Triglycerides 94; VLDL 19. She was discharged on low dose statin and will need LFT check 4-6 weeks.    Discharge Vitals Blood pressure 119/61, pulse 58, temperature 97.5 F (36.4 C), temperature  source Oral, resp. rate 16, height  (1.753 m), weight 159 lb 6.4 oz (72.303 kg), SpO2 97 %.  Filed Weights   06/02/15 1718 06/02/15 2134  Weight: 157 lb (71.215 kg) 159 lb 6.4 oz (72.303 kg)    CBC  Recent Labs  06/02/15 1725  WBC 7.8  HGB 13.3  HCT 41.5  MCV 89.8  PLT 219   Basic Metabolic Panel  Recent Labs  06/02/15 1725 06/03/15 0216  NA 138  --   K 4.4  --   CL 99*  --   CO2 32  --   GLUCOSE 97  --   BUN 21*  --   CREATININE 0.64  --   CALCIUM 9.0  --   MG  --  2.0   Cardiac Enzymes  Recent Labs  06/02/15 2041 06/03/15 0216 06/03/15 1210  TROPONINI <0.03 <0.03 <0.03   Fasting Lipid Panel  Recent Labs  06/03/15 0216  CHOL 173  HDL 50  LDLCALC 104*  TRIG 94  CHOLHDL 3.5   Thyroid Function Tests  Recent Labs  06/03/15 0216  TSH 2.976    Disposition  Pt is being discharged home today in good condition.  Follow-up Plans & Appointments  Follow-up Information    Follow up with Ronie Spies, PA-C.   Specialties:  Cardiology, Radiology   Why:  @  9:30 for post hospital   Contact information:   [address] [place] [address] [phone]       Follow up with PCP.   Contact information:   Make an appointment with your primary care provider as soon as possible for musculoskeletal pain and L breast nodule.       Follow up with Coumadin clinic.   Contact information:   F/u with coumadin clinic as schedule as outpatient prior to admisson.           Discharge Instructions    Call MD for:  severe uncontrolled pain    Complete by:  As directed      Diet - low sodium heart healthy    Complete by:  As directed      Diet - low sodium heart healthy    Complete by:  As directed      Discharge instructions    Complete by:  As directed   Take your home coumadin dose as schedule starting tonight and f/u with coumadin clinic as scheduled as outpatient.     Increase activity slowly    Complete by:  As directed       Increase activity slowly    Complete by:  As directed             Consider OP f/u labs 6-8 weeks given statin initiation this admission.  Discharge Medications    Medication List    TAKE these medications        ALPRAZolam 0.25 MG tablet  Commonly known as:  XANAX  Take 1 tablet (0.25 mg total) by mouth 2 (two) times daily.     atorvastatin 20 MG tablet  Commonly known as:  LIPITOR  Take 1 tablet (20 mg total) by mouth daily at 6 PM.     dicyclomine 10 MG capsule  Commonly known as:  BENTYL  1 capsule by mouth twice daily as needed for abdominal pain or cramps.     digoxin 0.125 MG tablet  Commonly known as:  LANOXIN  Take 1 tablet (0.125 mg total) by mouth daily.     isosorbide mononitrate 30 MG 24 hr tablet  Commonly known as:  IMDUR  TAKE 1 TABLET BY MOUTH DAILY     levothyroxine 100 MCG tablet  Commonly known as:  SYNTHROID, LEVOTHROID  TAKE 1 TABLET BY MOUTH EVERY DAY     loperamide 2 MG capsule  Commonly known as:  IMODIUM  Take 1 mg by mouth daily as needed for diarrhea or loose stools.     metoprolol 50 MG tablet  Commonly known as:  LOPRESSOR  TAKE 1 TABLET (50 MG TOTAL) BY MOUTH 2 (TWO) TIMES DAILY.     traMADol 50 MG tablet  Commonly known as:  ULTRAM  Take 25-50 mg by mouth every 6 (six) hours as needed for pain.     warfarin 2.5 MG tablet  Commonly known as:  COUMADIN  Take 1.25-2.5 mg by mouth daily. Take half of the 2.5mg  tablet on Mon, Wed and fridays. Other days take a whole tablet        Duration of Discharge Encounter   Greater than 30 minutes including physician time.  Signed, Rylie Knierim PA-C 06/03/2015, 2:21 PM

## 2015-06-04 LAB — HEMOGLOBIN A1C
HEMOGLOBIN A1C: 6.5 % — AB (ref 4.8–5.6)
MEAN PLASMA GLUCOSE: 140 mg/dL

## 2015-06-05 ENCOUNTER — Other Ambulatory Visit: Payer: Self-pay | Admitting: Pharmacist Clinician (PhC)/ Clinical Pharmacy Specialist

## 2015-06-05 MED ORDER — WARFARIN SODIUM 2.5 MG PO TABS: ORAL_TABLET | ORAL | Status: DC

## 2015-06-27 ENCOUNTER — Ambulatory Visit: Payer: Medicare HMO | Admitting: Physician Assistant

## 2015-07-03 ENCOUNTER — Other Ambulatory Visit: Payer: Self-pay | Admitting: Cardiovascular Disease

## 2015-07-04 ENCOUNTER — Ambulatory Visit: Payer: Self-pay | Admitting: Pharmacist Clinician (PhC)/ Clinical Pharmacy Specialist

## 2015-07-04 DIAGNOSIS — Z5181 Encounter for therapeutic drug level monitoring: Secondary | ICD-10-CM

## 2015-07-04 DIAGNOSIS — I482 Chronic atrial fibrillation, unspecified: Secondary | ICD-10-CM

## 2015-07-21 ENCOUNTER — Telehealth: Payer: Self-pay | Admitting: Cardiology

## 2015-07-21 ENCOUNTER — Encounter: Payer: Medicare HMO | Admitting: *Deleted

## 2015-07-21 NOTE — Telephone Encounter (Signed)
Spoke with pt and reminded pt of remote transmission that is due today. Pt verbalized understanding.   

## 2015-07-22 ENCOUNTER — Encounter: Payer: Self-pay | Admitting: Cardiology

## 2015-07-23 ENCOUNTER — Other Ambulatory Visit: Payer: Self-pay | Admitting: Cardiovascular Disease

## 2015-07-23 NOTE — Telephone Encounter (Signed)
Rx request sent to pharmacy.  

## 2015-07-28 ENCOUNTER — Telehealth (INDEPENDENT_AMBULATORY_CARE_PROVIDER_SITE_OTHER): Payer: Self-pay | Admitting: *Deleted

## 2015-07-28 NOTE — Telephone Encounter (Signed)
Patient's weight check 156 lbs .7oz  At OV 05/16/15 patient's weight was 157 lbs 6.4 oz. To address with Dr Karilyn Cota.

## 2015-07-28 NOTE — Telephone Encounter (Signed)
Recheck weight in 3 months per Dr.Rehman.

## 2015-07-28 NOTE — Telephone Encounter (Signed)
Weight check = 156.7

## 2015-07-29 NOTE — Telephone Encounter (Signed)
Patient was called and made aware. 

## 2015-08-13 ENCOUNTER — Encounter (INDEPENDENT_AMBULATORY_CARE_PROVIDER_SITE_OTHER): Payer: Self-pay | Admitting: Internal Medicine

## 2015-08-22 ENCOUNTER — Other Ambulatory Visit: Payer: Self-pay | Admitting: Cardiovascular Disease

## 2015-08-22 NOTE — Telephone Encounter (Signed)
REFILL 

## 2015-08-23 ENCOUNTER — Other Ambulatory Visit: Payer: Self-pay | Admitting: Cardiology

## 2015-08-31 ENCOUNTER — Emergency Department (HOSPITAL_COMMUNITY): Payer: Medicare HMO

## 2015-08-31 ENCOUNTER — Emergency Department (HOSPITAL_COMMUNITY)
Admission: EM | Admit: 2015-08-31 | Discharge: 2015-08-31 | Disposition: A | Discharge status: Home / Self Care | Payer: Medicare HMO | Attending: Emergency Medicine | Admitting: Emergency Medicine

## 2015-08-31 ENCOUNTER — Encounter (HOSPITAL_COMMUNITY): Payer: Self-pay | Admitting: Nurse Practitioner

## 2015-08-31 DIAGNOSIS — Z950 Presence of cardiac pacemaker: Secondary | ICD-10-CM | POA: Diagnosis not present

## 2015-08-31 DIAGNOSIS — I252 Old myocardial infarction: Secondary | ICD-10-CM | POA: Diagnosis not present

## 2015-08-31 DIAGNOSIS — R079 Chest pain, unspecified: Secondary | ICD-10-CM | POA: Diagnosis present

## 2015-08-31 DIAGNOSIS — F419 Anxiety disorder, unspecified: Secondary | ICD-10-CM | POA: Insufficient documentation

## 2015-08-31 DIAGNOSIS — I4891 Unspecified atrial fibrillation: Secondary | ICD-10-CM | POA: Insufficient documentation

## 2015-08-31 DIAGNOSIS — E039 Hypothyroidism, unspecified: Secondary | ICD-10-CM | POA: Insufficient documentation

## 2015-08-31 DIAGNOSIS — Z9861 Coronary angioplasty status: Secondary | ICD-10-CM | POA: Diagnosis not present

## 2015-08-31 DIAGNOSIS — I10 Essential (primary) hypertension: Secondary | ICD-10-CM | POA: Insufficient documentation

## 2015-08-31 DIAGNOSIS — I2510 Atherosclerotic heart disease of native coronary artery without angina pectoris: Secondary | ICD-10-CM | POA: Diagnosis not present

## 2015-08-31 DIAGNOSIS — Z7901 Long term (current) use of anticoagulants: Secondary | ICD-10-CM | POA: Diagnosis not present

## 2015-08-31 DIAGNOSIS — Z79899 Other long term (current) drug therapy: Secondary | ICD-10-CM | POA: Diagnosis not present

## 2015-08-31 DIAGNOSIS — Z880 Allergy status to penicillin: Secondary | ICD-10-CM | POA: Diagnosis not present

## 2015-08-31 DIAGNOSIS — R0789 Other chest pain: Secondary | ICD-10-CM

## 2015-08-31 DIAGNOSIS — Z8673 Personal history of transient ischemic attack (TIA), and cerebral infarction without residual deficits: Secondary | ICD-10-CM | POA: Diagnosis not present

## 2015-08-31 DIAGNOSIS — E119 Type 2 diabetes mellitus without complications: Secondary | ICD-10-CM | POA: Diagnosis not present

## 2015-08-31 LAB — BASIC METABOLIC PANEL
Anion gap: 9 (ref 5–15)
BUN: 22 mg/dL — AB (ref 6–20)
CHLORIDE: 103 mmol/L (ref 101–111)
CO2: 28 mmol/L (ref 22–32)
CREATININE: 0.63 mg/dL (ref 0.44–1.00)
Calcium: 9.7 mg/dL (ref 8.9–10.3)
GFR calc Af Amer: >60 mL/min (ref >60)
Glucose, Bld: 98 mg/dL (ref 65–99)
Potassium: 4.1 mmol/L (ref 3.5–5.1)
SODIUM: 140 mmol/L (ref 135–145)

## 2015-08-31 LAB — CBC
HCT: 44.4 % (ref 36.0–46.0)
Hemoglobin: 14.1 g/dL (ref 12.0–15.0)
MCH: 28.1 pg (ref 26.0–34.0)
MCHC: 31.8 g/dL (ref 30.0–36.0)
MCV: 88.6 fL (ref 78.0–100.0)
Platelets: 233 10*3/uL (ref 150–400)
RBC: 5.01 MIL/uL (ref 3.87–5.11)
RDW: 14.3 % (ref 11.5–15.5)
WBC: 8.3 10*3/uL (ref 4.0–10.5)

## 2015-08-31 LAB — PROTIME-INR
INR: 2.85 — ABNORMAL HIGH (ref 0.00–1.49)
PROTHROMBIN TIME: 29.5 s — AB (ref 11.6–15.2)

## 2015-08-31 LAB — I-STAT TROPONIN, ED
TROPONIN I, POC: 0 ng/mL (ref 0.00–0.08)
TROPONIN I, POC: 0.01 ng/mL (ref 0.00–0.08)

## 2015-08-31 NOTE — ED Provider Notes (Signed)
CSN: [id number]     Arrival date & time 08/31/15  1218 History   First MD Initiated Contact with Patient 08/31/15 1224     Chief Complaint  Patient presents with  . Chest Pain    (Consider location/radiation/quality/duration/timing/severity/associated sxs/prior Treatment) HPI Comments: Patient with history of MI, status post stenting, most recent 2014 showing some mild CAD with patent stent, on warfarin -- presents with acute onset of chest pain starting approximately 10:30am. Patient describes intermittent sharp stabbing left-sided chest pain that did not radiate. It was not associated with diaphoresis, shortness of breath, palpitations. She had some mild aching in her upper left arm. Pain was short-lived and then completely resolved. No treatment prior to arrival. Patient states that she is unable to take aspirin. She denies other symptoms of illness. Nothing makes symptoms better or worse.  Patient was concerned because her previous MI manifested as lower rib pain which was mild and did not really cause the patient much concern.  The history is provided by the patient and medical records.    Past Medical History  Diagnosis Date  . A-fib (HCC)   . Stroke (HCC)   . Pacemaker   . Arthritis   . Diabetes mellitus     x 10 yrs at least  . Anxiety   . Polio   . Hx of echocardiogram 09/2012    showed Ef 35-40% with no significant valve disease except for mild to moderate MR. normal RVSP and left atrium was severly dilated.  Marland Kitchen History of stress test 08/2011    negative for ischemia it was nongated and nondiagnostic and compatible with a possible scar or peri-infraction ischemia.   . Coronary artery disease 12/2007  . Hypertension   . Thyroid disease     hypothyroidism   Past Surgical History  Procedure Laterality Date  . Cardiac surgery    . 3 cardiac stents      she has had non-DES stent to the mid circumflex in 2009 and LAD stent to the mid-distal LAD  BMS June 18, 2010  .  Abdominal hysterectomy  1978  . Tonsillectomy    . Appendectomy    . Pacemaker placement  08/11/2010    Medtronic Adapta   . Coronary angioplasty    . Left heart catheterization with coronary angiogram N/A 05/10/2013    Procedure: LEFT HEART CATHETERIZATION WITH CORONARY ANGIOGRAM;  Surgeon: Lennette Bihari, MD;  Location: [facility] CATH LAB;  Service: Cardiovascular;  Laterality: N/A;  . Transthoracic echocardiogram  01/17/2009    EF=>55%.IMPAIRED LV RELAXATION.LA IS MODERATELY DILATED.RA IS MILDLY DILATED. RV SYSTOLIC PRESSURE IS ELEVATED AT 30-40MMHG.MILD TO MODERATE TR.MILD MR.SINCE PRIOR TTE,BOTH ATRIA APPEAR TO BE MORE DILATED.  . Stress perfusion test  10/22/2009    MILD ISCHEMIA IN THE BASAL ANTEROLATERAL AND MID ANTEROLATERAL REGIONS.EF 52%.   Family History  Problem Relation Age of Onset  . Heart disease Father   . Cancer Mother    Social History  Substance Use Topics  . Smoking status: Never Smoker   . Smokeless tobacco: Never Used  . Alcohol Use: No   OB History    No data available     Review of Systems  Constitutional: Negative for fever and diaphoresis.  Eyes: Negative for redness.  Respiratory: Negative for cough and shortness of breath.   Cardiovascular: Positive for chest pain. Negative for palpitations and leg swelling.  Gastrointestinal: Negative for nausea, vomiting and abdominal pain.  Genitourinary: Negative for dysuria.  Musculoskeletal: Negative for back  pain and neck pain.  Skin: Negative for rash.  Neurological: Negative for syncope and light-headedness.  Psychiatric/Behavioral: The patient is not nervous/anxious.     Allergies  Iohexol; Penicillins; and Sulfur  Home Medications   Prior to Admission medications   Medication Sig Start Date End Date Taking? Authorizing Provider  ALPRAZolam (XANAX) 0.25 MG tablet Take 1 tablet (0.25 mg total) by mouth 2 (two) times daily. Patient taking differently: Take 0.25 mg by mouth at bedtime.  03/28/15   Veryl Speak, FNP  atorvastatin (LIPITOR) 20 MG tablet Take 1 tablet (20 mg total) by mouth daily at 6 PM. 06/03/15   Bhavinkumar Bhagat, PA  dicyclomine (BENTYL) 10 MG capsule 1 capsule by mouth twice daily as needed for abdominal pain or cramps. 05/26/15   Malissa Hippo, MD  DIGOX 125 MCG tablet TAKE 1 TABLET BY MOUTH DAILY 08/22/15   Lennette Bihari, MD  isosorbide mononitrate (IMDUR) 30 MG 24 hr tablet TAKE 1 TABLET BY MOUTH DAILY 01/02/15   Lennette Bihari, MD  levothyroxine (SYNTHROID, LEVOTHROID) 100 MCG tablet TAKE 1 TABLET BY MOUTH EVERY DAY 04/08/15   Malissa Hippo, MD  loperamide (IMODIUM) 2 MG capsule Take 1 mg by mouth daily as needed for diarrhea or loose stools.     Historical Provider, MD  metoprolol (LOPRESSOR) 50 MG tablet TAKE 1 TABLET (50 MG TOTAL) BY MOUTH 2 (TWO) TIMES DAILY. 12/02/14   Lennette Bihari, MD  traMADol (ULTRAM) 50 MG tablet Take 25-50 mg by mouth every 6 (six) hours as needed for pain.     Historical Provider, MD  warfarin (COUMADIN) 2.5 MG tablet TAKE 1 TABLET BY MOUTH DAILY AS DIRECTED BY COUMADIN CLINIC 07/04/15   Rollene Rotunda, MD   BP 121/73 mmHg  Pulse 59  Temp(Src) 97.3 F (36.3 C) (Oral)  Resp 16  SpO2 98%   Physical Exam  Constitutional: She appears well-developed and well-nourished.  HENT:  Head: Normocephalic and atraumatic.  Mouth/Throat: Mucous membranes are normal. Mucous membranes are not dry.  Eyes: Conjunctivae are normal.  Neck: Trachea normal and normal range of motion. Neck supple. Normal carotid pulses and no JVD present. No muscular tenderness present. Carotid bruit is not present. No tracheal deviation present.  Cardiovascular: Normal rate, regular rhythm, S1 normal, S2 normal, normal heart sounds and intact distal pulses.  Exam reveals no decreased pulses.   No murmur heard. Pulmonary/Chest: Effort normal. No respiratory distress. She has no wheezes. She exhibits no tenderness.  Abdominal: Soft. Normal aorta and bowel sounds are normal. There  is no tenderness. There is no rebound and no guarding.  Musculoskeletal: Normal range of motion.  Neurological: She is alert.  Skin: Skin is warm and dry. She is not diaphoretic. No cyanosis. No pallor.  Psychiatric: She has a normal mood and affect.  Nursing note and vitals reviewed.   ED Course  Procedures (including critical care time) Labs Review Labs Reviewed  BASIC METABOLIC PANEL - Abnormal; Notable for the following:    BUN 22 (*)    All other components within normal limits  PROTIME-INR - Abnormal; Notable for the following:    Prothrombin Time 29.5 (*)    INR 2.85 (*)    All other components within normal limits  CBC  I-STAT TROPOININ, ED    Imaging Review No results found. I have personally reviewed and evaluated these images and lab results as part of my medical decision-making.   EKG Interpretation   Date/Time:  :53 PM Patient seen and examined. Work-up initiated. Medications ordered.   Vital signs reviewed and are as follows: BP 121/73 mmHg  Pulse 59  Temp(Src) 97.3 F (36.3 C) (Oral)  Resp 16  SpO2 98%  2:35 PM Patient discussed with Dr. Anitra Lauth. Spoke with Dr. Gala Romney. We reviewed previous history and atypical story today for ACS. We both have low suspicion. Plan is for delta troponin and to have patient ambulate in ED. If she develops CP, will call for admit. If everything is negative, can go home with follow-up.   4:04 PM Patient ambulated around in the hallway without chest pain or with assistance. Awaiting 2nd troponin.   2nd trop neg. Pt updated. She is anxious about going home but agreeable to discharge with follow-up this week.    Patient was counseled to return with severe chest pain, especially if the pain is crushing or pressure-like and spreads to the arms, back, neck, or jaw, or if they have sweating, nausea, or shortness of breath with the pain. They were encouraged to call 911 with these symptoms.   They were also told to return if their chest pain gets worse and does not go away with rest, they have an attack of chest pain lasting longer than usual despite rest and treatment with the medications their caregiver has prescribed, if they wake from sleep with chest pain or shortness of breath, if they feel dizzy or faint, if they have chest pain not typical of their usual pain, or if they have any other emergent concerns regarding their health.  The patient verbalized understanding and agreed.     MDM   Final diagnoses:  Atypical chest pain   Patient with h/o MI. She has chest pain atypical for MI. EKG unchanged (although paced), troponin neg x 2. No ongoing pain. No pain with walking in hallway. Reviewed case with cardiology who is agreeable to d/c to home with follow-up given patient presentation today. Pt to f/u with her cardiologist this week. She appears well.    Renne Crigler, PA-C 09/01/15 7829  Gwyneth Sprout, MD 09/01/15 [phone]

## 2015-08-31 NOTE — Discharge Instructions (Signed)
Please read and follow all provided instructions.  Your diagnoses today include:  1. Atypical chest pain    Tests performed today include:  An EKG of your heart  A chest x-ray  Cardiac enzymes - a blood test for heart muscle damage shows no sign of heart attack  Blood counts and electrolytes  Vital signs. See below for your results today.   Medications prescribed:   None  Take any prescribed medications only as directed.  Follow-up instructions: Please follow-up with your primary care provider as soon as you can for further evaluation of your symptoms.   Return instructions:  SEEK IMMEDIATE MEDICAL ATTENTION IF:  You have severe chest pain, especially if the pain is crushing or pressure-like and spreads to the arms, back, neck, or jaw, or if you have sweating, nausea (feeling sick to your stomach), or shortness of breath. THIS IS AN EMERGENCY. Don't wait to see if the pain will go away. Get medical help at once. Call 911 or 0 (operator). DO NOT drive yourself to the hospital.   Your chest pain gets worse and does not go away with rest.   You have an attack of chest pain lasting longer than usual, despite rest and treatment with the medications your caregiver has prescribed.   You wake from sleep with chest pain or shortness of breath.  You feel dizzy or faint.  You have chest pain not typical of your usual pain for which you originally saw your caregiver.   You have any other emergent concerns regarding your health.  Additional Information: Chest pain comes from many different causes. Your caregiver has diagnosed you as having chest pain that is not specific for one problem, but does not require admission.  You are at low risk for an acute heart condition or other serious illness.   Your vital signs today were: BP 136/77 mmHg   Pulse 65   Temp(Src) 97.3 F (36.3 C) (Oral)   Resp 17   SpO2 92% If your blood pressure (BP) was elevated above 135/85 this visit, please  have this repeated by your doctor within one month. --------------

## 2015-08-31 NOTE — ED Notes (Signed)
She was sitting in church this am and began to have intermittent shooting chest pains under her L breast, she has had 2 heart attacks but this feels different. She reports chronic cough that has not been worse this week. She denies SOB. She states she overexerted herself cooking yesterday without resting. She is A&ox4, resp e/u

## 2015-08-31 NOTE — ED Notes (Signed)
Pt stable, ambulatory, states understanding of discharge instructions 

## 2015-08-31 NOTE — ED Notes (Signed)
Patient transported to X-ray 

## 2015-08-31 NOTE — ED Notes (Signed)
Pt ambulated in hall.  Steady.  Denies any chest pain SOB

## 2015-10-13 ENCOUNTER — Emergency Department (HOSPITAL_COMMUNITY)
Admission: EM | Admit: 2015-10-13 | Discharge: 2015-10-14 | Disposition: A | Discharge status: Home / Self Care | Payer: Medicare HMO | Attending: Emergency Medicine | Admitting: Emergency Medicine

## 2015-10-13 ENCOUNTER — Encounter (HOSPITAL_COMMUNITY): Payer: Self-pay | Admitting: Emergency Medicine

## 2015-10-13 DIAGNOSIS — I2510 Atherosclerotic heart disease of native coronary artery without angina pectoris: Secondary | ICD-10-CM | POA: Insufficient documentation

## 2015-10-13 DIAGNOSIS — R791 Abnormal coagulation profile: Secondary | ICD-10-CM | POA: Diagnosis not present

## 2015-10-13 DIAGNOSIS — Z8673 Personal history of transient ischemic attack (TIA), and cerebral infarction without residual deficits: Secondary | ICD-10-CM | POA: Insufficient documentation

## 2015-10-13 DIAGNOSIS — Z880 Allergy status to penicillin: Secondary | ICD-10-CM | POA: Insufficient documentation

## 2015-10-13 DIAGNOSIS — E119 Type 2 diabetes mellitus without complications: Secondary | ICD-10-CM | POA: Insufficient documentation

## 2015-10-13 DIAGNOSIS — Z79899 Other long term (current) drug therapy: Secondary | ICD-10-CM | POA: Diagnosis not present

## 2015-10-13 DIAGNOSIS — Z950 Presence of cardiac pacemaker: Secondary | ICD-10-CM | POA: Insufficient documentation

## 2015-10-13 DIAGNOSIS — I10 Essential (primary) hypertension: Secondary | ICD-10-CM | POA: Insufficient documentation

## 2015-10-13 DIAGNOSIS — R040 Epistaxis: Secondary | ICD-10-CM

## 2015-10-13 DIAGNOSIS — E039 Hypothyroidism, unspecified: Secondary | ICD-10-CM | POA: Diagnosis not present

## 2015-10-13 LAB — CBC WITH DIFFERENTIAL/PLATELET
BASOS ABS: 0 10*3/uL (ref 0.0–0.1)
BASOS PCT: 1 %
EOS ABS: 0.2 10*3/uL (ref 0.0–0.7)
EOS PCT: 3 %
HCT: 41.2 % (ref 36.0–46.0)
Hemoglobin: 13.5 g/dL (ref 12.0–15.0)
LYMPHS PCT: 33 %
Lymphs Abs: 2.4 10*3/uL (ref 0.7–4.0)
MCH: 28.8 pg (ref 26.0–34.0)
MCHC: 32.8 g/dL (ref 30.0–36.0)
MCV: 88 fL (ref 78.0–100.0)
Monocytes Absolute: 0.6 10*3/uL (ref 0.1–1.0)
Monocytes Relative: 8 %
Neutro Abs: 3.9 10*3/uL (ref 1.7–7.7)
Neutrophils Relative %: 55 %
PLATELETS: 213 10*3/uL (ref 150–400)
RBC: 4.68 MIL/uL (ref 3.87–5.11)
RDW: 14.6 % (ref 11.5–15.5)
WBC: 7.1 10*3/uL (ref 4.0–10.5)

## 2015-10-13 LAB — BASIC METABOLIC PANEL
ANION GAP: 9 (ref 5–15)
BUN: 22 mg/dL — ABNORMAL HIGH (ref 6–20)
CALCIUM: 9.1 mg/dL (ref 8.9–10.3)
CO2: 29 mmol/L (ref 22–32)
Chloride: 102 mmol/L (ref 101–111)
Creatinine, Ser: 0.68 mg/dL (ref 0.44–1.00)
Glucose, Bld: 138 mg/dL — ABNORMAL HIGH (ref 65–99)
POTASSIUM: 3.7 mmol/L (ref 3.5–5.1)
SODIUM: 140 mmol/L (ref 135–145)

## 2015-10-13 LAB — PROTIME-INR
INR: 3.39 — AB (ref 0.00–1.49)
PROTHROMBIN TIME: 33.6 s — AB (ref 11.6–15.2)

## 2015-10-13 MED ORDER — PHYTONADIONE 5 MG PO TABS
2.5000 mg | ORAL_TABLET | Freq: Once | ORAL | Status: DC
  Filled 2015-10-13: qty 1

## 2015-10-13 MED ORDER — VITAMIN K1 1 MG/0.5ML IJ SOLN
1.0000 mg | Freq: Once | INTRAMUSCULAR | Status: AC
  Administered 2015-10-13: 1 mg via SUBCUTANEOUS
  Filled 2015-10-13: qty 0.5

## 2015-10-13 NOTE — ED Provider Notes (Signed)
CSN: [id number]   Arrival date & time 10/13/15 1821  History  By signing my name below, I, Bethel Born, attest that this documentation has been prepared under the direction and in the presence of Dione Booze, MD. Electronically Signed: Bethel Born, ED Scribe. 10/13/2015. 11:19 PM.  Chief Complaint  Patient presents with  . Epistaxis    HPI The history is provided by the patient. No language interpreter was used.   [name] is a 81 y.o. female on with history of HTN and a-fib on Warfarin who presents to the Emergency Department complaining of atraumatic right-sided epistaxis with onset this evening around 5 PM. The bleeding has currently resolved. Over the last several days she has had rhinorrhea but denies hard blowing or picking.  Her last episode of epistaxis was as a child. Primary Care at [facility] in [place], [place].   Past Medical History  Diagnosis Date  . A-fib (HCC)   . Stroke (HCC)   . Pacemaker   . Arthritis   . Diabetes mellitus     x 10 yrs at least  . Anxiety   . Polio   . Hx of echocardiogram 09/2012    showed Ef 35-40% with no significant valve disease except for mild to moderate MR. normal RVSP and left atrium was severly dilated.  Marland Kitchen History of stress test 08/2011    negative for ischemia it was nongated and nondiagnostic and compatible with a possible scar or peri-infraction ischemia.   . Coronary artery disease 12/2007  . Hypertension   . Thyroid disease     hypothyroidism    Past Surgical History  Procedure Laterality Date  . Cardiac surgery    . 3 cardiac stents      she has had non-DES stent to the mid circumflex in 2009 and LAD stent to the mid-distal LAD  BMS June 18, 2010  . Abdominal hysterectomy  1978  . Tonsillectomy    . Appendectomy    . Pacemaker placement  08/11/2010    Medtronic Adapta   . Coronary angioplasty    . Left heart catheterization with coronary angiogram N/A 05/10/2013    Procedure: LEFT HEART  CATHETERIZATION WITH CORONARY ANGIOGRAM;  Surgeon: Lennette Bihari, MD;  Location: [facility] CATH LAB;  Service: Cardiovascular;  Laterality: N/A;  . Transthoracic echocardiogram  01/17/2009    EF=>55%.IMPAIRED LV RELAXATION.LA IS MODERATELY DILATED.RA IS MILDLY DILATED. RV SYSTOLIC PRESSURE IS ELEVATED AT 30-40MMHG.MILD TO MODERATE TR.MILD MR.SINCE PRIOR TTE,BOTH ATRIA APPEAR TO BE MORE DILATED.  . Stress perfusion test  10/22/2009    MILD ISCHEMIA IN THE BASAL ANTEROLATERAL AND MID ANTEROLATERAL REGIONS.EF 52%.    Family History  Problem Relation Age of Onset  . Heart disease Father   . Cancer Mother     Social History  Substance Use Topics  . Smoking status: Never Smoker   . Smokeless tobacco: Never Used  . Alcohol Use: No     Review of Systems  HENT: Positive for nosebleeds and rhinorrhea.   All other systems reviewed and are negative.  Home Medications   Prior to Admission medications   Medication Sig Start Date End Date Taking? Authorizing Provider  ALPRAZolam (XANAX) 0.25 MG tablet Take 1 tablet (0.25 mg total) by mouth 2 (two) times daily. Patient taking differently: Take 0.25 mg by mouth at bedtime.  03/28/15  Yes Veryl Speak, FNP  dicyclomine (BENTYL) 10 MG capsule 1 capsule by mouth twice daily as needed for abdominal pain or  cramps. 05/26/15  Yes Malissa Hippo, MD  DIGOX 125 MCG tablet TAKE 1 TABLET BY MOUTH DAILY 08/22/15  Yes Lennette Bihari, MD  isosorbide mononitrate (IMDUR) 30 MG 24 hr tablet TAKE 1 TABLET BY MOUTH DAILY 01/02/15  Yes Lennette Bihari, MD  levothyroxine (SYNTHROID, LEVOTHROID) 100 MCG tablet TAKE 1 TABLET BY MOUTH EVERY DAY 04/08/15  Yes Malissa Hippo, MD  loperamide (IMODIUM) 2 MG capsule Take 1 mg by mouth every morning.    Yes Historical Provider, MD  metoprolol (LOPRESSOR) 50 MG tablet TAKE 1 TABLET (50 MG TOTAL) BY MOUTH 2 (TWO) TIMES DAILY. 12/02/14  Yes Lennette Bihari, MD  traMADol (ULTRAM) 50 MG tablet Take 25-50 mg by mouth every 6 (six) hours as  needed for pain.    Yes Historical Provider, MD  warfarin (COUMADIN) 2.5 MG tablet TAKE 1 TABLET BY MOUTH DAILY AS DIRECTED BY COUMADIN CLINIC 07/04/15  Yes Rollene Rotunda, MD    Allergies  Iohexol; Penicillins; and Sulfur  Triage Vitals: BP 148/74 mmHg  Pulse 60  Temp(Src) 98.5 F (36.9 C) (Oral)  Resp 16  Ht  (1.753 m)  Wt 155 lb (70.308 kg)  BMI 22.88 kg/m2  SpO2 97%  Physical Exam  Constitutional: She is oriented to person, place, and time. She appears well-developed and well-nourished.  HENT:  Head: Normocephalic.  Bleeding site identified at the right side of the nasal septum without active bleeding.  Eyes: EOM are normal. Pupils are equal, round, and reactive to light.  Neck: Normal range of motion. Neck supple. No JVD present.  Cardiovascular: Normal rate and normal heart sounds.  An irregular rhythm present.  No murmur heard. Pulmonary/Chest: Effort normal and breath sounds normal. She has no wheezes. She has no rales. She exhibits no tenderness.  Abdominal: Soft. Bowel sounds are normal. She exhibits no distension and no mass. There is no tenderness.  Musculoskeletal: Normal range of motion.  Trace pitting edema  Lymphadenopathy:    She has no cervical adenopathy.  Neurological: She is alert and oriented to person, place, and time. No cranial nerve deficit. She exhibits normal muscle tone. Coordination normal.  Skin: Skin is warm and dry. No rash noted.  Psychiatric: She has a normal mood and affect. Her behavior is normal. Judgment and thought content normal.  Nursing note and vitals reviewed.   ED Course  .Epistaxis Management Date/Time: 10/13/2015 11:15 PM Performed by: Dione Booze Authorized by: Preston Fleeting, Reya Aurich Consent: Verbal consent obtained. Written consent not obtained. Risks and benefits: risks, benefits and alternatives were discussed Consent given by: patient Patient understanding: patient states understanding of the procedure being  performed Patient consent: the patient's understanding of the procedure matches consent given Procedure consent: procedure consent matches procedure scheduled Relevant documents: relevant documents present and verified Test results: test results available and properly labeled Site marked: the operative site was marked Required items: required blood products, implants, devices, and special equipment available Patient identity confirmed: verbally with patient and arm band Patient sedated: no Treatment site: right anterior and right Kiesselbach's area Repair method: nasal balloon (4.5 Rapid Rhino) Post-procedure assessment: bleeding stopped Treatment complexity: simple Patient tolerance: Patient tolerated the procedure well with no immediate complications       DIAGNOSTIC STUDIES: Oxygen Saturation is 97% on RA, normal by my interpretation.    COORDINATION OF CARE: 11:09 PM Discussed treatment plan which includes lab work, Vitamin K, and Rapid Rhino with pt at bedside and pt agreed to plan.  Results  for orders placed or performed during the hospital encounter of 10/13/15  CBC with Differential  Result Value Ref Range   WBC 7.1 4.0 - 10.5 K/uL   RBC 4.68 3.87 - 5.11 MIL/uL   Hemoglobin 13.5 12.0 - 15.0 g/dL   HCT 83.6 62.9 - 47.6 %   MCV 88.0 78.0 - 100.0 fL   MCH 28.8 26.0 - 34.0 pg   MCHC 32.8 30.0 - 36.0 g/dL   RDW 54.6 50.3 - 54.6 %   Platelets 213 150 - 400 K/uL   Neutrophils Relative % 55 %   Neutro Abs 3.9 1.7 - 7.7 K/uL   Lymphocytes Relative 33 %   Lymphs Abs 2.4 0.7 - 4.0 K/uL   Monocytes Relative 8 %   Monocytes Absolute 0.6 0.1 - 1.0 K/uL   Eosinophils Relative 3 %   Eosinophils Absolute 0.2 0.0 - 0.7 K/uL   Basophils Relative 1 %   Basophils Absolute 0.0 0.0 - 0.1 K/uL  Basic metabolic panel  Result Value Ref Range   Sodium 140 135 - 145 mmol/L   Potassium 3.7 3.5 - 5.1 mmol/L   Chloride 102 101 - 111 mmol/L   CO2 29 22 - 32 mmol/L   Glucose, Bld 138 (H)  65 - 99 mg/dL   BUN 22 (H) 6 - 20 mg/dL   Creatinine, Ser 5.68 0.44 - 1.00 mg/dL   Calcium 9.1 8.9 - 12.7 mg/dL   GFR calc non Af Amer >60 >60 mL/min   GFR calc Af Amer >60 >60 mL/min   Anion gap 9 5 - 15  Protime-INR  Result Value Ref Range   Prothrombin Time 33.6 (H) 11.6 - 15.2 seconds   INR 3.39 (H) 0.00 - 1.49    I personally reviewed and evaluated these lab results as a part of my medical decision-making.    MDM   Final diagnoses:  Anterior epistaxis  Supratherapeutic INR    Anterior epistaxis. Old records are reviewed and she is on warfarin for atrial fibrillation. INR has come back slightly elevated today. She is given a small dose of vitamin K to partially reverse the anticoagulant effect. Bleeding was controlled with a Rapid Rhino. She was observed for one hour without any recurrence of bleeding and was discharged with instructions to follow-up with ENT in 3 days to have the Rapid Rhino removed. Advised to contact Coumadin clinic for recommendations on adjustment of warfarin dose.  I personally performed the services described in this documentation, which was scribed in my presence. The recorded information has been reviewed and is accurate.      Dione Booze, MD 10/14/15 [id number]

## 2015-10-13 NOTE — ED Notes (Addendum)
Pt. reports right nasal bleeding onset this evening , denies injury , no bleeding at triage , currently taking Warfarin - history of Afib/CAD.

## 2015-10-14 NOTE — Discharge Instructions (Signed)
Please call the Coumadin clinic in the morning to get instructions on how to adjust your dose of Coumadin/warfarin.   Nosebleed Nosebleeds are common. They are due to a crack in the inside lining of your nose (mucous membrane) or from a small blood vessel that starts to bleed. Nosebleeds can be caused by many conditions, such as injury, infections, dry mucous membranes or dry climate, medicines, nose picking, and home heating and cooling systems. Most nosebleeds come from blood vessels in the front of your nose. HOME CARE INSTRUCTIONS   Try controlling your nosebleed by pinching your nostrils gently and continuously for at least 10 minutes.  Avoid blowing or sniffing your nose for a number of hours after having a nosebleed.  Do not put gauze inside your nose yourself. If your nose was packed by your health care provider, try to maintain the pack inside of your nose until your health care provider removes it.  If a gauze pack was used and it starts to fall out, gently replace it or cut off the end of it.  If a balloon catheter was used to pack your nose, do not cut or remove it unless your health care provider has instructed you to do that.  Avoid lying down while you are having a nosebleed. Sit up and lean forward.  Use a nasal spray decongestant to help with a nosebleed as directed by your health care provider.  Do not use petroleum jelly or mineral oil in your nose. These can drip into your lungs.  Maintain humidity in your home by using less air conditioning or by using a humidifier.  Aspirinand blood thinners make bleeding more likely. If you are prescribed these medicines and you suffer from nosebleeds, ask your health care provider if you should stop taking the medicines or adjust the dose. Do not stop medicines unless directed by your health care provider  Resume your normal activities as you are able, but avoid straining, lifting, or bending at the waist for several days.  If  your nosebleed was caused by dry mucous membranes, use over-the-counter saline nasal spray or gel. This will keep the mucous membranes moist and allow them to heal. If you must use a lubricant, choose the water-soluble variety. Use it only sparingly, and do not use it within several hours of lying down.  Keep all follow-up visits as directed by your health care provider. This is important. SEEK MEDICAL CARE IF:  You have a fever.  You get frequent nosebleeds.  You are getting nosebleeds more often. SEEK IMMEDIATE MEDICAL CARE IF:  Your nosebleed lasts longer than 20 minutes.  Your nosebleed occurs after an injury to your face, and your nose looks crooked or broken.  You have unusual bleeding from other parts of your body.  You have unusual bruising on other parts of your body.  You feel light-headed or you faint.  You become sweaty.  You vomit blood.  Your nosebleed occurs after a head injury.   This information is not intended to replace advice given to you by your health care provider. Make sure you discuss any questions you have with your health care provider.   Document Released: 08/25/2005 Document Revised: 12/06/2014 Document Reviewed: 07/01/2014 Elsevier Interactive Patient Education [employer]

## 2015-10-23 ENCOUNTER — Other Ambulatory Visit: Payer: Self-pay | Admitting: Cardiovascular Disease

## 2015-11-08 ENCOUNTER — Other Ambulatory Visit: Payer: Self-pay | Admitting: Cardiovascular Disease

## 2015-11-10 ENCOUNTER — Other Ambulatory Visit: Payer: Self-pay | Admitting: Cardiovascular Disease

## 2015-11-10 MED ORDER — METOPROLOL TARTRATE 50 MG PO TABS: 50.0000 mg | ORAL_TABLET | Freq: Two times a day (BID) | ORAL | Status: DC

## 2015-11-10 NOTE — Telephone Encounter (Signed)
REFILL 

## 2015-12-02 ENCOUNTER — Ambulatory Visit (INDEPENDENT_AMBULATORY_CARE_PROVIDER_SITE_OTHER): Payer: Medicare HMO | Admitting: Internal Medicine

## 2015-12-02 ENCOUNTER — Encounter (INDEPENDENT_AMBULATORY_CARE_PROVIDER_SITE_OTHER): Payer: Self-pay | Admitting: *Deleted

## 2015-12-22 ENCOUNTER — Other Ambulatory Visit: Payer: Self-pay | Admitting: Family

## 2015-12-23 ENCOUNTER — Encounter (INDEPENDENT_AMBULATORY_CARE_PROVIDER_SITE_OTHER): Payer: Self-pay | Admitting: Internal Medicine

## 2015-12-23 ENCOUNTER — Ambulatory Visit (INDEPENDENT_AMBULATORY_CARE_PROVIDER_SITE_OTHER): Payer: Medicare HMO | Admitting: Internal Medicine

## 2015-12-23 VITALS — BP 126/64 | HR 74 | Temp 97.6°F | Ht 69.0 in | Wt 156.0 lb

## 2015-12-23 DIAGNOSIS — K589 Irritable bowel syndrome without diarrhea: Secondary | ICD-10-CM

## 2015-12-23 DIAGNOSIS — N816 Rectocele: Secondary | ICD-10-CM | POA: Diagnosis not present

## 2015-12-23 NOTE — Progress Notes (Signed)
Subjective:    Patient ID: [name], female    DOB: 1930-07-20, 82 y.o.   MRN: [id number]  HPI Here today for f/u. She was last seen by Dr. Karilyn Cota for f/u of her recent hx of C-diff in March of last year. She was treated with Vancomycin  qid. . Follow up C. Difficile in April was negative.   Her stools are formed. She says she constantly feels like she has diarrhea.  She is having 3-5 BMs a day which she says she has to manually do.  She does have abdominal cramps.   Her appetite is okay. She become full early. She has not lost any weight since her last visit.  No problem with acid reflux.      Review of Systems Past Medical History  Diagnosis Date  . A-fib (HCC)   . Stroke (HCC)   . Pacemaker   . Arthritis   . Diabetes mellitus     x 10 yrs at least  . Anxiety   . Polio   . Hx of echocardiogram 09/2012    showed Ef 35-40% with no significant valve disease except for mild to moderate MR. normal RVSP and left atrium was severly dilated.  Marland Kitchen History of stress test 08/2011    negative for ischemia it was nongated and nondiagnostic and compatible with a possible scar or peri-infraction ischemia.   . Coronary artery disease 12/2007  . Hypertension   . Thyroid disease     hypothyroidism    Past Surgical History  Procedure Laterality Date  . Cardiac surgery    . 3 cardiac stents      she has had non-DES stent to the mid circumflex in 2009 and LAD stent to the mid-distal LAD  BMS June 18, 2010  . Abdominal hysterectomy  1978  . Tonsillectomy    . Appendectomy    . Pacemaker placement  08/11/2010    Medtronic Adapta   . Coronary angioplasty    . Left heart catheterization with coronary angiogram N/A 05/10/2013    Procedure: LEFT HEART CATHETERIZATION WITH CORONARY ANGIOGRAM;  Surgeon: Lennette Bihari, MD;  Location: [facility] CATH LAB;  Service: Cardiovascular;  Laterality: N/A;  . Transthoracic echocardiogram  01/17/2009    EF=>55%.IMPAIRED LV RELAXATION.LA IS MODERATELY  DILATED.RA IS MILDLY DILATED. RV SYSTOLIC PRESSURE IS ELEVATED AT 30-40MMHG.MILD TO MODERATE TR.MILD MR.SINCE PRIOR TTE,BOTH ATRIA APPEAR TO BE MORE DILATED.  . Stress perfusion test  10/22/2009    MILD ISCHEMIA IN THE BASAL ANTEROLATERAL AND MID ANTEROLATERAL REGIONS.EF 52%.    Allergies  Allergen Reactions  . Iohexol Other (See Comments)     Desc: CHEST PAIN, LOC AFTER HEART CATH, PT REFUSED DYE   . Penicillins Other (See Comments)    REACTION:  unknown  . Sulfur Other (See Comments)    REACTION:  unknown    Current Outpatient Prescriptions on File Prior to Visit  Medication Sig Dispense Refill  . ALPRAZolam (XANAX) 0.25 MG tablet Take 1 tablet (0.25 mg total) by mouth 2 (two) times daily. (Patient taking differently: Take 0.25 mg by mouth at bedtime. ) 60 tablet 0  . dicyclomine (BENTYL) 10 MG capsule 1 capsule by mouth twice daily as needed for abdominal pain or cramps. 60 capsule 5  . DIGOX 125 MCG tablet TAKE 1 TABLET BY MOUTH DAILY 90 tablet 1  . isosorbide mononitrate (IMDUR) 30 MG 24 hr tablet TAKE 1 TABLET BY MOUTH DAILY 30 tablet 11  . levothyroxine (SYNTHROID, LEVOTHROID)  100 MCG tablet TAKE 1 TABLET BY MOUTH EVERY DAY 30 tablet 5  . loperamide (IMODIUM) 2 MG capsule Take 1 mg by mouth every morning. Reported on 12/23/2015    . metoprolol (LOPRESSOR) 50 MG tablet Take 1 tablet (50 mg total) by mouth 2 (two) times daily. PLEASE SCHEDULE APPOINTMENT FOR REFILLS. 60 tablet 0  . traMADol (ULTRAM) 50 MG tablet Take 25-50 mg by mouth every 6 (six) hours as needed for pain.     Marland Kitchen warfarin (COUMADIN) 2.5 MG tablet TAKE 1 TABLET BY MOUTH DAILY AS DIRECTED BY COUMADIN CLINIC 30 tablet 0   No current facility-administered medications on file prior to visit.        Objective:   Physical Exam Blood pressure 126/64, pulse 74, temperature 97.6 F (36.4 C), height 5\' 9"  (1.753 m), weight 156 lb (70.761 kg). Alert and oriented. Skin warm and dry. Oral mucosa is moist.   . Sclera  anicteric, conjunctivae is pink. Thyroid not enlarged. No cervical lymphadenopathy. Lungs clear. Heart regular rate and rhythm.  Abdomen is soft. Bowel sounds are positive. No hepatomegaly. No abdominal masses felt. No tenderness.  No edema to lower extremities.  Patient has ? Rectocele bulging from her vagina.         Assessment & Plan:  Irritable bowel syndrome. She is taking Dicyclomine twice a day. Her stools are formed.  She does have intermittent spells of abdominal crmaps and occasionally diarrhea. She does have a hard time having a BM and has to manually evacuate her self.  ? May be related to ? Rectocele.   Weight loss: She has maintained her weight since her lat visit.   Referral to Dr. Emelda Fear

## 2015-12-23 NOTE — Patient Instructions (Addendum)
Continue to Dicyclomine. Follow up with GYN for her prolapsed uterus.  OV in 6 months.

## 2015-12-24 ENCOUNTER — Encounter (INDEPENDENT_AMBULATORY_CARE_PROVIDER_SITE_OTHER): Payer: Self-pay | Admitting: Internal Medicine

## 2015-12-30 ENCOUNTER — Telehealth (INDEPENDENT_AMBULATORY_CARE_PROVIDER_SITE_OTHER): Payer: Self-pay | Admitting: Internal Medicine

## 2015-12-30 ENCOUNTER — Other Ambulatory Visit: Payer: Self-pay | Admitting: Cardiovascular Disease

## 2015-12-30 NOTE — Telephone Encounter (Signed)
Mrs. Coba husband Dorinda Hill, left a message on our voice mail saying we were supposed to refer her to a different Provider to handle her Rectocele(sp?) but they've not been contacted. He now wants to go through Dr. Annabell Howells to receive it. Please give him a call if needed.  Pt's ph# [phone] Thank you.

## 2015-12-30 NOTE — Telephone Encounter (Signed)
We have called Dr. Rayna Sexton office. Hopefully the referral will be later this week.

## 2015-12-30 NOTE — Telephone Encounter (Signed)
Rx request sent to pharmacy.  

## 2016-01-09 ENCOUNTER — Encounter: Payer: Self-pay | Admitting: Gynecology

## 2016-01-09 ENCOUNTER — Ambulatory Visit (INDEPENDENT_AMBULATORY_CARE_PROVIDER_SITE_OTHER): Payer: Medicare HMO | Admitting: Gynecology

## 2016-01-09 VITALS — BP 136/80 | Ht 69.0 in | Wt 156.0 lb

## 2016-01-09 DIAGNOSIS — N993 Prolapse of vaginal vault after hysterectomy: Secondary | ICD-10-CM | POA: Insufficient documentation

## 2016-01-09 NOTE — Progress Notes (Signed)
   Patient  Is an 82 year old who was asked to see [place] today by her gastroenterologist with concerns of prolapse vaginal wall. Patient in the early 70s had a total abdominal hysterectomy bilateral salpingo-oophorectomy for benign fibroids by my retired partner. At some time in the past the patient had seen the urologist but due to her multiple medical comorbidities she was never a surgical candidate. She reports no stress urinary incontinence she reports no rectal incontinence. See patient's problem list and list of medication for further detail. Patient does have a pacemaker and is on anticoagulation. 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   exam:  Blood pressure 136/80    Weight 156 pounds    Height 5 feet 9 inches tall    BMI 23.04 kg/m  Gen. Appearance anxious Caucasian female worried that her intestines are falling out   abdomen: Soft nontender no rebound or guarding Pelvic: Bartholin urethra Skene glands with atrophic changes,  First to second-degree cystocele Vagina:  Mucosa with atrophic changes weakness on the anterior and posterior compartments and apical defect caused by loss of supportive uterosacral ligaments.  cervix absent  Rectum:  First- second-degree rectocele   Assessment/plan : Patient with vaginal vault prolapse involving the anterior, posterior compartments as well as apical defect noted. Patient with multiple medical comorbidities not surgical candidate was fitted for a Gellhorn pessary size 3. Patient was able to remove and replace herself in the exam room. It will be ordered and she will return back to the office next week. She will take it out once a week to clean and to apply Estrace vaginal cream once a week. Literature information was provided.

## 2016-01-21 ENCOUNTER — Telehealth: Payer: Self-pay | Admitting: Cardiovascular Disease

## 2016-01-21 NOTE — Telephone Encounter (Signed)
Pt insurance will no longer pay for her Digoxin.What medicine can she take in the place of this?

## 2016-01-21 NOTE — Telephone Encounter (Signed)
Left msg to call.

## 2016-01-22 NOTE — Telephone Encounter (Signed)
The pt called and requested a lower tier for the digoxin. The insurance company called and gave a list of lower tier alternatives for the digoxin. Those include lisinopril, losartan,ramapril, telmisartan,quidiine verapamil, enalapril and irbesartan. To approve the digoxin for a lower tier the pt would have to have tried one of these meds.  They are going to fax the form for our review.

## 2016-01-22 NOTE — Telephone Encounter (Signed)
Follow up ° ° ° ° ° °Returning a call to the nurse °

## 2016-01-22 NOTE — Telephone Encounter (Signed)
Spoke with [name], the pts insurance has made the digoxin a tier 3 drug. This has made the cost out of reach for the pt. He reports the insurance company was going to contact us about an alternative for the digoxin but we have not heard from them yet. She only has a few pills left and then she will be out. He wants to know from dr Tresa Endo if there is something else she can use in place of the digoxin. Will forward for dr [facility] review.

## 2016-01-25 ENCOUNTER — Other Ambulatory Visit: Payer: Self-pay | Admitting: Family

## 2016-01-25 NOTE — Telephone Encounter (Signed)
Other drugs are not the same

## 2016-01-26 ENCOUNTER — Telehealth: Payer: Self-pay | Admitting: *Deleted

## 2016-01-26 NOTE — Telephone Encounter (Signed)
Paperwork regarding digoxin faxed to company.

## 2016-01-26 NOTE — Telephone Encounter (Signed)
Faxed medication request to change digoxin to [employer].

## 2016-01-27 ENCOUNTER — Ambulatory Visit (INDEPENDENT_AMBULATORY_CARE_PROVIDER_SITE_OTHER): Payer: Medicare HMO | Admitting: Pharmacist Clinician (PhC)/ Clinical Pharmacy Specialist

## 2016-01-27 ENCOUNTER — Encounter: Payer: Self-pay | Admitting: Cardiovascular Disease

## 2016-01-27 ENCOUNTER — Ambulatory Visit: Payer: Medicare HMO | Admitting: Gynecology

## 2016-01-27 ENCOUNTER — Telehealth: Payer: Self-pay | Admitting: *Deleted

## 2016-01-27 ENCOUNTER — Ambulatory Visit (INDEPENDENT_AMBULATORY_CARE_PROVIDER_SITE_OTHER): Payer: Medicare HMO | Admitting: Gynecology

## 2016-01-27 ENCOUNTER — Encounter: Payer: Self-pay | Admitting: Gynecology

## 2016-01-27 ENCOUNTER — Ambulatory Visit (INDEPENDENT_AMBULATORY_CARE_PROVIDER_SITE_OTHER): Payer: Medicare HMO | Admitting: Cardiovascular Disease

## 2016-01-27 ENCOUNTER — Telehealth: Payer: Self-pay | Admitting: Cardiovascular Disease

## 2016-01-27 VITALS — BP 116/70

## 2016-01-27 DIAGNOSIS — I482 Chronic atrial fibrillation, unspecified: Secondary | ICD-10-CM

## 2016-01-27 DIAGNOSIS — N993 Prolapse of vaginal vault after hysterectomy: Secondary | ICD-10-CM | POA: Diagnosis not present

## 2016-01-27 DIAGNOSIS — I2510 Atherosclerotic heart disease of native coronary artery without angina pectoris: Secondary | ICD-10-CM

## 2016-01-27 DIAGNOSIS — Z7901 Long term (current) use of anticoagulants: Secondary | ICD-10-CM | POA: Diagnosis not present

## 2016-01-27 LAB — POCT INR: INR: 1.8

## 2016-01-27 MED ORDER — ESTRADIOL 0.1 MG/GM VA CREA
TOPICAL_CREAM | VAGINAL | Status: DC
Start: 2016-01-27 — End: 2016-01-28

## 2016-01-27 NOTE — Progress Notes (Signed)
[name] [birth year] [id number]        82 y.o.  G3P3 Presents for pessary placement. History of vaginal vault prolapse following hysterectomy.  Was seen by Dr. Lily Peer 01/09/2016 and was fitted with a #3 Gellhorn pessary. Returns now for placement.  Past medical history,surgical history, problem list, medications, allergies, family history and social history were all reviewed and documented in the EPIC chart.  Directed ROS with pertinent positives and negatives documented in the history of present illness/assessment and plan.  Exam: Kennon Portela assistant Filed Vitals:   01/27/16 1008  BP: 116/70   General appearance:  Normal Abdomen soft nontender without masses guarding rebound. Pelvic external BUS vagina with atrophic changes. First to second-degree cystocele, second-degree rectocele and vault first-degree prolapse noted. Bimanual without masses or tenderness  #3 Gelhorn pessary placed without difficulty.  Assessment/Plan:  82 y.o. G3P3 with vaginal vault prolapse after hysterectomy. Gelhorn pessary placed. Patient will follow up in 2-3 weeks for pessary reexamination.    Dara Lords MD, 10:49 AM 01/27/2016

## 2016-01-27 NOTE — Progress Notes (Signed)
Patient ID: [name], female   DOB: 03-19-1930, 82 y.o.   MRN: [id number]    Cardiology Office Note    Date:  01/27/2016   ID:  [name], DOB 05-12-1930, MRN [id number]  PCP:  No PCP Per Patient  Cardiologist: Nicki Guadalajara, MD;  Thurmon Fair, MD   Chief Complaint  Patient presents with  . Annual Exam    PACER CHECK    History of Present Illness:  [name] is a 82 y.o. female followed by [facility] for CAD, mild cardiomyopathy and chronic atrial fibrillation  with slow ventricular response for which she received a pacemaker in 2011.   She has not had any interval health problems, neurological events or bleeding complications on warfarin.  The patient specifically denies any chest pain at rest or with exertion, dyspnea at rest or with exertion, orthopnea, paroxysmal nocturnal dyspnea, syncope, palpitations, focal neurological deficits, intermittent claudication, lower extremity edema, unexplained weight gain, cough, hemoptysis or wheezing.  Pacemaker shows normal function, 55% V pacing, 3.5 years battery longevity, very rare and brief high ventricular rates (max 7"). Lead parameters are normal and stable.  [name] has known coronary artery disease and underwent bare-metal stenting to her circumflex coronary artery in 2009. She had mild 30-40% RCA stenosis and a normal LAD.  An echo Doppler study in November 2013 showed an ejection fraction of 35-40% and severe LA dilatation. There was mild mitral annular calcification with mild-to-moderate mitral regurgitation.LVEF has recovered, suggesting she may have had arrhythmia related cardiomyopathy. She had a low risk nuclear perfusion study in July 2016 (no ischemia, EF 52%).  Past Medical History  Diagnosis Date  . A-fib (HCC)   . Stroke (HCC)   . Pacemaker   . Arthritis   . Diabetes mellitus     x 10 yrs at least  . Anxiety   . Polio   . Hx of echocardiogram 09/2012    showed Ef 35-40% with no significant valve  disease except for mild to moderate MR. normal RVSP and left atrium was severly dilated.  Marland Kitchen History of stress test 08/2011    negative for ischemia it was nongated and nondiagnostic and compatible with a possible scar or peri-infraction ischemia.   . Coronary artery disease 12/2007  . Hypertension   . Thyroid disease     hypothyroidism    Past Surgical History  Procedure Laterality Date  . Cardiac surgery    . 3 cardiac stents      she has had non-DES stent to the mid circumflex in 2009 and LAD stent to the mid-distal LAD  BMS June 18, 2010  . Abdominal hysterectomy  1978  . Tonsillectomy    . Appendectomy    . Pacemaker placement  08/11/2010    Medtronic Adapta   . Coronary angioplasty    . Left heart catheterization with coronary angiogram N/A 05/10/2013    Procedure: LEFT HEART CATHETERIZATION WITH CORONARY ANGIOGRAM;  Surgeon: Lennette Bihari, MD;  Location: [facility] CATH LAB;  Service: Cardiovascular;  Laterality: N/A;  . Transthoracic echocardiogram  01/17/2009    EF=>55%.IMPAIRED LV RELAXATION.LA IS MODERATELY DILATED.RA IS MILDLY DILATED. RV SYSTOLIC PRESSURE IS ELEVATED AT 30-40MMHG.MILD TO MODERATE TR.MILD MR.SINCE PRIOR TTE,BOTH ATRIA APPEAR TO BE MORE DILATED.  . Stress perfusion test  10/22/2009    MILD ISCHEMIA IN THE BASAL ANTEROLATERAL AND MID ANTEROLATERAL REGIONS.EF 52%.    Outpatient Prescriptions Prior to Visit  Medication Sig Dispense Refill  . ALPRAZolam (XANAX) 0.25 MG tablet  Take 1 tablet (0.25 mg total) by mouth 2 (two) times daily. (Patient taking differently: Take 0.25 mg by mouth at bedtime. ) 60 tablet 0  . dicyclomine (BENTYL) 10 MG capsule 1 capsule by mouth twice daily as needed for abdominal pain or cramps. 60 capsule 5  . DIGOX 125 MCG tablet TAKE 1 TABLET BY MOUTH DAILY 90 tablet 1  . isosorbide mononitrate (IMDUR) 30 MG 24 hr tablet Take 1 tablet (30 mg total) by mouth daily. Please schedule appointment for refills. 30 tablet 0  . levothyroxine (SYNTHROID,  LEVOTHROID) 100 MCG tablet TAKE 1 TABLET BY MOUTH EVERY DAY 30 tablet 5  . levothyroxine (SYNTHROID, LEVOTHROID) 100 MCG tablet TAKE 1 TABLET(100 MCG) BY MOUTH DAILY 30 tablet 0  . loperamide (IMODIUM) 2 MG capsule Take 1 mg by mouth every morning. Reported on 01/09/2016    . metoprolol (LOPRESSOR) 50 MG tablet Take 1 tablet (50 mg total) by mouth 2 (two) times daily. PLEASE SCHEDULE APPOINTMENT FOR REFILLS. 60 tablet 0  . traMADol (ULTRAM) 50 MG tablet Take 25-50 mg by mouth every 6 (six) hours as needed for pain.     Marland Kitchen warfarin (COUMADIN) 2.5 MG tablet TAKE 1 TABLET BY MOUTH DAILY AS DIRECTED BY COUMADIN CLINIC 30 tablet 0   No facility-administered medications prior to visit.     Allergies:   Iohexol; Penicillins; and Sulfur   Social History   Social History  . Marital Status: Married    Spouse Name: N/A  . Number of Children: N/A  . Years of Education: N/A   Social History Main Topics  . Smoking status: Never Smoker   . Smokeless tobacco: Never Used  . Alcohol Use: No  . Drug Use: No  . Sexual Activity: Not Asked   Other Topics Concern  . None   Social History Narrative     Family History:  The patient's family history includes Cancer in her mother; Heart disease in her father.   ROS:   Please see the history of present illness.    ROS All other systems reviewed and are negative.   PHYSICAL EXAM:   VS:  BP 128/81 mmHg  Pulse 85  Ht 5\' 9"  (1.753 m)  Wt 71.215 kg (157 lb)  BMI 23.17 kg/m2   GEN: Well nourished, well developed, in no acute distress HEENT: normal Neck: no JVD, carotid bruits, or masses Cardiac: irregular, normal S1, S2; no murmurs, rubs, or gallops,no edema; healthy L subclavian PM site  Respiratory:  clear to auscultation bilaterally, normal work of breathing GI: soft, nontender, nondistended, + BS MS: no deformity or atrophy Skin: warm and dry, no rash Neuro:  Alert and Oriented x 3, Strength and sensation are intact Psych: euthymic mood, full  affect  Wt Readings from Last 3 Encounters:  01/27/16 71.215 kg (157 lb)  01/09/16 70.761 kg (156 lb)  12/23/15 70.761 kg (156 lb)      Studies/Labs Reviewed:   EKG:  EKG is not ordered today.    Recent Labs: 06/03/2015: Magnesium 2.0; TSH 2.976 10/13/2015: BUN 22*; Creatinine, Ser 0.68; Hemoglobin 13.5; Platelets 213; Potassium 3.7; Sodium 140   Lipid Panel    Component Value Date/Time   CHOL 173 06/03/2015 0216   TRIG 94 06/03/2015 0216   HDL 50 06/03/2015 0216   CHOLHDL 3.5 06/03/2015 0216   VLDL 19 06/03/2015 0216   LDLCALC 104* 06/03/2015 0216    Additional studies/ records that were reviewed today include:  Notes from appt with Dr.  kelly    ASSESSMENT:    1. Chronic atrial fibrillation (HCC)   2. Long term (current) use of anticoagulants [Z79.01]   3. Coronary artery disease involving native coronary artery of native heart without angina pectoris      PLAN:  In order of problems listed above:  1. AFib:  Adequate balance between avoiding high V rates and limiting V pacing. I wonder whether she could stop digoxin, which she complains is costly 2. Warfarin anticoagulation:  CHADSVasc at least 6 (age 21, HTN, LV dysfunction, gender, CAD). No bleeding problems. INR today was 1.8 (followed in [facility]). 3. CAD: currently angina free. LDL a little above target. She is not currently taking a statin. Will discuss with Dr. Tresa Endo.    Medication Adjustments/Labs and Tests Ordered: Current medicines are reviewed at length with the patient today.  Concerns regarding medicines are outlined above.  Medication changes, Labs and Tests ordered today are listed in the Patient Instructions below. Patient Instructions  Remote monitoring is used to monitor your Pacemaker from home. This monitoring reduces the number of office visits required to check your device to one time per year. It allows [place] to monitor the functioning of your device to ensure it is working properly. You  are scheduled for a device check from home on Apr 26, 2016. You may send your transmission at any time that day. If you have a wireless device, the transmission will be sent automatically. After your physician reviews your transmission, you will receive a postcard with your next transmission date.  Dr. Royann Shivers recommends that you schedule a follow-up appointment in: ONE YEAR WITH PACEMAKER CHECK (MEDTRONIC-BLUE).         Joie Bimler, MD  01/27/2016 7:50 PM    [facility] [address] [place], [place], [place]  [address] Phone: [phone]; Fax: [phone]

## 2016-01-27 NOTE — Patient Instructions (Addendum)
Follow up in 2-3 weeks for pessary reexamination Follow up with your gastroenterologist in reference to your GI complaints.  You were recently seen in [facility]

## 2016-01-27 NOTE — Telephone Encounter (Signed)
Attempt to reach caller. No answer when dialed.   Routed to [place] - there has been recent work done on this prior authorization.

## 2016-01-27 NOTE — Telephone Encounter (Signed)
He would to know if Dr Tresa Endo can prescribed something else in the place of her Digoxin please? She can not afford the Digoxin now,because it in a different tier.She needs something today,she been without her medicine for a few days.

## 2016-01-27 NOTE — Patient Instructions (Signed)
Remote monitoring is used to monitor your Pacemaker from home. This monitoring reduces the number of office visits required to check your device to one time per year. It allows us to monitor the functioning of your device to ensure it is working properly. You are scheduled for a device check from home on Apr 26, 2016. You may send your transmission at any time that day. If you have a wireless device, the transmission will be sent automatically. After your physician reviews your transmission, you will receive a postcard with your next transmission date.  Dr. Croitoru recommends that you schedule a follow-up appointment in: ONE YEAR WITH PACEMAKER CHECK (MEDTRONIC-BLUE).    

## 2016-01-27 NOTE — Telephone Encounter (Signed)
Pt husband [name] called (he has dpr access) stating Rx from OV 01/09/16 estrace vaginal cream was never sent to pharmacy. Rx will be sent pt aware. Per note 01/09/16 "apply Estrace vaginal cream once a week"

## 2016-01-28 ENCOUNTER — Other Ambulatory Visit: Payer: Self-pay | Admitting: Gynecology

## 2016-01-28 ENCOUNTER — Telehealth: Payer: Self-pay | Admitting: *Deleted

## 2016-01-28 MED ORDER — ESTRADIOL 10 MCG VA TABS: 1.0000 | ORAL_TABLET | VAGINAL | Status: DC

## 2016-01-28 NOTE — Telephone Encounter (Signed)
Pt husband Dorinda Hill informed, Rx sent.

## 2016-01-28 NOTE — Telephone Encounter (Signed)
See if Vagifem 10 MCG intra vaginal twice a week is covered if so call in 8 with 11 refills

## 2016-01-28 NOTE — Telephone Encounter (Signed)
Pt was prescribed estrace vaginal cream on OV 01/09/16 to apply once a week. Patient said mediation is $200 too expensive for her. She would like another Rx. Please advise

## 2016-01-29 ENCOUNTER — Encounter: Payer: Self-pay | Admitting: Gynecology

## 2016-02-01 ENCOUNTER — Other Ambulatory Visit: Payer: Self-pay | Admitting: Cardiovascular Disease

## 2016-02-06 ENCOUNTER — Other Ambulatory Visit: Payer: Self-pay | Admitting: Cardiovascular Disease

## 2016-02-12 ENCOUNTER — Ambulatory Visit: Payer: Medicare HMO | Admitting: Gynecology

## 2016-02-23 ENCOUNTER — Other Ambulatory Visit: Payer: Self-pay | Admitting: Family

## 2016-02-23 LAB — CUP PACEART INCLINIC DEVICE CHECK
Battery Impedance: 1929 Ohm
Battery Voltage: 2.75 V
Date Time Interrogation Session: 20170228152534
Implantable Lead Implant Date: 20111017
Lead Channel Setting Pacing Pulse Width: 0.4 ms
Lead Channel Setting Sensing Sensitivity: 2 mV
MDC IDC LEAD LOCATION: 753860
MDC IDC MSMT BATTERY REMAINING LONGEVITY: 41 mo
MDC IDC MSMT LEADCHNL RA IMPEDANCE VALUE: 0 Ohm
MDC IDC MSMT LEADCHNL RV IMPEDANCE VALUE: 642 Ohm
MDC IDC SET LEADCHNL RV PACING AMPLITUDE: 2.5 V
MDC IDC STAT BRADY RV PERCENT PACED: 55 %

## 2016-02-27 ENCOUNTER — Emergency Department (HOSPITAL_COMMUNITY)
Admission: EM | Admit: 2016-02-27 | Discharge: 2016-02-27 | Disposition: A | Discharge status: Home / Self Care | Payer: Medicare HMO | Attending: Emergency Medicine | Admitting: Emergency Medicine

## 2016-02-27 ENCOUNTER — Encounter (HOSPITAL_COMMUNITY): Payer: Self-pay | Admitting: *Deleted

## 2016-02-27 ENCOUNTER — Emergency Department (HOSPITAL_COMMUNITY): Payer: Medicare HMO

## 2016-02-27 DIAGNOSIS — J40 Bronchitis, not specified as acute or chronic: Secondary | ICD-10-CM | POA: Insufficient documentation

## 2016-02-27 DIAGNOSIS — Z8612 Personal history of poliomyelitis: Secondary | ICD-10-CM | POA: Insufficient documentation

## 2016-02-27 DIAGNOSIS — I2510 Atherosclerotic heart disease of native coronary artery without angina pectoris: Secondary | ICD-10-CM | POA: Insufficient documentation

## 2016-02-27 DIAGNOSIS — R0602 Shortness of breath: Secondary | ICD-10-CM | POA: Diagnosis present

## 2016-02-27 DIAGNOSIS — Z950 Presence of cardiac pacemaker: Secondary | ICD-10-CM | POA: Diagnosis not present

## 2016-02-27 DIAGNOSIS — E079 Disorder of thyroid, unspecified: Secondary | ICD-10-CM | POA: Insufficient documentation

## 2016-02-27 DIAGNOSIS — E119 Type 2 diabetes mellitus without complications: Secondary | ICD-10-CM | POA: Insufficient documentation

## 2016-02-27 DIAGNOSIS — Z79899 Other long term (current) drug therapy: Secondary | ICD-10-CM | POA: Insufficient documentation

## 2016-02-27 DIAGNOSIS — Z880 Allergy status to penicillin: Secondary | ICD-10-CM | POA: Insufficient documentation

## 2016-02-27 DIAGNOSIS — I10 Essential (primary) hypertension: Secondary | ICD-10-CM | POA: Diagnosis not present

## 2016-02-27 DIAGNOSIS — Z8673 Personal history of transient ischemic attack (TIA), and cerebral infarction without residual deficits: Secondary | ICD-10-CM | POA: Diagnosis not present

## 2016-02-27 LAB — COMPREHENSIVE METABOLIC PANEL
ALT: 15 U/L (ref 14–54)
AST: 21 U/L (ref 15–41)
Albumin: 3.8 g/dL (ref 3.5–5.0)
Alkaline Phosphatase: 87 U/L (ref 38–126)
Anion gap: 11 (ref 5–15)
BUN: 17 mg/dL (ref 6–20)
CALCIUM: 9.1 mg/dL (ref 8.9–10.3)
CO2: 28 mmol/L (ref 22–32)
CREATININE: 0.64 mg/dL (ref 0.44–1.00)
Chloride: 98 mmol/L — ABNORMAL LOW (ref 101–111)
Glucose, Bld: 124 mg/dL — ABNORMAL HIGH (ref 65–99)
Potassium: 4.3 mmol/L (ref 3.5–5.1)
SODIUM: 137 mmol/L (ref 135–145)
TOTAL PROTEIN: 6.7 g/dL (ref 6.5–8.1)
Total Bilirubin: 1.3 mg/dL — ABNORMAL HIGH (ref 0.3–1.2)

## 2016-02-27 LAB — URINALYSIS, ROUTINE W REFLEX MICROSCOPIC
BILIRUBIN URINE: NEGATIVE
Glucose, UA: NEGATIVE mg/dL
KETONES UR: NEGATIVE mg/dL
NITRITE: NEGATIVE
PH: 5 (ref 5.0–8.0)
PROTEIN: 30 mg/dL — AB
Specific Gravity, Urine: 1.021 (ref 1.005–1.030)

## 2016-02-27 LAB — URINE MICROSCOPIC-ADD ON

## 2016-02-27 LAB — CBC WITH DIFFERENTIAL/PLATELET
BASOS PCT: 1 %
Basophils Absolute: 0 10*3/uL (ref 0.0–0.1)
EOS ABS: 0.1 10*3/uL (ref 0.0–0.7)
Eosinophils Relative: 1 %
HCT: 45.7 % (ref 36.0–46.0)
HEMOGLOBIN: 14.9 g/dL (ref 12.0–15.0)
Lymphocytes Relative: 17 %
Lymphs Abs: 0.9 10*3/uL (ref 0.7–4.0)
MCH: 28.6 pg (ref 26.0–34.0)
MCHC: 32.6 g/dL (ref 30.0–36.0)
MCV: 87.7 fL (ref 78.0–100.0)
MONO ABS: 0.8 10*3/uL (ref 0.1–1.0)
MONOS PCT: 16 %
NEUTROS PCT: 65 %
Neutro Abs: 3.4 10*3/uL (ref 1.7–7.7)
Platelets: 183 10*3/uL (ref 150–400)
RBC: 5.21 MIL/uL — ABNORMAL HIGH (ref 3.87–5.11)
RDW: 14.9 % (ref 11.5–15.5)
WBC: 5.2 10*3/uL (ref 4.0–10.5)

## 2016-02-27 LAB — LIPASE, BLOOD: LIPASE: 20 U/L (ref 11–51)

## 2016-02-27 MED ORDER — ALBUTEROL SULFATE HFA 108 (90 BASE) MCG/ACT IN AERS
2.0000 | INHALATION_SPRAY | Freq: Once | RESPIRATORY_TRACT | Status: AC
  Administered 2016-02-27: 2 via RESPIRATORY_TRACT
  Filled 2016-02-27: qty 6.7

## 2016-02-27 MED ORDER — ALBUTEROL SULFATE HFA 108 (90 BASE) MCG/ACT IN AERS: 1.0000 | INHALATION_SPRAY | Freq: Four times a day (QID) | RESPIRATORY_TRACT | Status: DC | PRN

## 2016-02-27 MED ORDER — IPRATROPIUM-ALBUTEROL 0.5-2.5 (3) MG/3ML IN SOLN
3.0000 mL | Freq: Once | RESPIRATORY_TRACT | Status: AC
  Administered 2016-02-27: 3 mL via RESPIRATORY_TRACT
  Filled 2016-02-27: qty 3

## 2016-02-27 MED ORDER — BENZONATATE 100 MG PO CAPS
100.0000 mg | ORAL_CAPSULE | Freq: Three times a day (TID) | ORAL | Status: DC | PRN
Start: 2016-02-27 — End: 2017-11-18

## 2016-02-27 NOTE — Discharge Instructions (Signed)
Please read and follow all provided instructions.  Your diagnoses today include:  1. Bronchitis    Tests performed today include:  Vital signs. See below for your results today.   Medications prescribed:   Take as prescribed   Home care instructions:  Follow any educational materials contained in this packet.  Follow-up instructions: Please follow-up with your primary care provider in the next week for further evaluation of symptoms and treatment   Return instructions:   Please return to the Emergency Department if you do not get better, if you get worse, or new symptoms OR  - Fever (temperature greater than 101.74F)  - Bleeding that does not stop with holding pressure to the area    -Severe pain (please note that you may be more sore the day after your accident)  - Chest Pain  - Difficulty breathing  - Severe nausea or vomiting  - Inability to tolerate food and liquids  - Passing out  - Skin becoming red around your wounds  - Change in mental status (confusion or lethargy)  - New numbness or weakness     Please return if you have any other emergent concerns.  Additional Information:  Your vital signs today were: BP 119/79 mmHg   Pulse 81   Temp(Src) 97.5 F (36.4 C) (Oral)   Resp 17   Ht 5\' 9"  (1.753 m)   Wt 68.692 kg   BMI 22.35 kg/m2   SpO2 93% If your blood pressure (BP) was elevated above 135/85 this visit, please have this repeated by your doctor within one month. ---------------

## 2016-02-27 NOTE — ED Provider Notes (Signed)
CSN: [id number]     Arrival date & time 02/27/16  1215 History   First MD Initiated Contact with Patient 02/27/16 1537     Chief Complaint  Patient presents with  . Shortness of Breath   (Consider location/radiation/quality/duration/timing/severity/associated sxs/prior Treatment) HPI 82 y.o. female with a hx of Afib, CVA, DM, CAD, HTN, presents to the Emergency Department today complaining of cough, shortness of breath x 2-3 weeks. States that she saw her PCP this week and diagnosed with Acute Bronchitis. No fever, No N/V/D. No CP. Noted epigastric abdominal pain after eating. Pain is 5/10. Sharp. Intermittent. Has not tried any OTC. Given ABX Rx of Keflex for Bronchitis. No hx smoking. No other symptoms noted.  Past Medical History  Diagnosis Date  . A-fib (HCC)   . Stroke (HCC)   . Pacemaker   . Arthritis   . Diabetes mellitus     x 10 yrs at least  . Anxiety   . Polio   . Hx of echocardiogram 09/2012    showed Ef 35-40% with no significant valve disease except for mild to moderate MR. normal RVSP and left atrium was severly dilated.  Marland Kitchen History of stress test 08/2011    negative for ischemia it was nongated and nondiagnostic and compatible with a possible scar or peri-infraction ischemia.   . Coronary artery disease 12/2007  . Hypertension   . Thyroid disease     hypothyroidism   Past Surgical History  Procedure Laterality Date  . Cardiac surgery    . 3 cardiac stents      she has had non-DES stent to the mid circumflex in 2009 and LAD stent to the mid-distal LAD  BMS June 18, 2010  . Abdominal hysterectomy  1978    TAH/BSO  . Tonsillectomy    . Appendectomy    . Pacemaker placement  08/11/2010    Medtronic Adapta   . Coronary angioplasty    . Left heart catheterization with coronary angiogram N/A 05/10/2013    Procedure: LEFT HEART CATHETERIZATION WITH CORONARY ANGIOGRAM;  Surgeon: Lennette Bihari, MD;  Location: [facility] CATH LAB;  Service: Cardiovascular;  Laterality: N/A;  .  Transthoracic echocardiogram  01/17/2009    EF=>55%.IMPAIRED LV RELAXATION.LA IS MODERATELY DILATED.RA IS MILDLY DILATED. RV SYSTOLIC PRESSURE IS ELEVATED AT 30-40MMHG.MILD TO MODERATE TR.MILD MR.SINCE PRIOR TTE,BOTH ATRIA APPEAR TO BE MORE DILATED.  . Stress perfusion test  10/22/2009    MILD ISCHEMIA IN THE BASAL ANTEROLATERAL AND MID ANTEROLATERAL REGIONS.EF 52%.   Family History  Problem Relation Age of Onset  . Heart disease Father   . Cancer Mother    Social History  Substance Use Topics  . Smoking status: Never Smoker   . Smokeless tobacco: Never Used  . Alcohol Use: No   OB History    Gravida Para Term Preterm AB TAB SAB Ectopic Multiple Living   3 3        3      Review of Systems ROS reviewed and all are negative for acute change except as noted in the HPI.  Allergies  Iohexol; Penicillins; and Sulfur  Home Medications   Prior to Admission medications   Medication Sig Start Date End Date Taking? Authorizing Provider  ALPRAZolam (XANAX) 0.25 MG tablet Take 1 tablet (0.25 mg total) by mouth 2 (two) times daily. Patient taking differently: Take 0.25 mg by mouth at bedtime.  03/28/15   Veryl Speak, FNP  dicyclomine (BENTYL) 10 MG capsule 1 capsule by mouth twice  daily as needed for abdominal pain or cramps. 05/26/15   Malissa Hippo, MD  DIGOX 125 MCG tablet TAKE 1 TABLET BY MOUTH DAILY 08/22/15   Lennette Bihari, MD  isosorbide mononitrate (IMDUR) 30 MG 24 hr tablet TAKE 1 TABLET BY MOUTH DAILY 02/02/16   Mihai Croitoru, MD  isosorbide mononitrate (IMDUR) 30 MG 24 hr tablet TAKE 1 TABLET BY MOUTH DAILY 02/06/16   Lennette Bihari, MD  levothyroxine (SYNTHROID, LEVOTHROID) 100 MCG tablet TAKE 1 TABLET BY MOUTH EVERY DAY 04/08/15   Malissa Hippo, MD  levothyroxine (SYNTHROID, LEVOTHROID) 100 MCG tablet TAKE 1 TABLET(100 MCG) BY MOUTH DAILY 02/23/16   Veryl Speak, FNP  loperamide (IMODIUM) 2 MG capsule Take 1 mg by mouth every morning. Reported on 01/09/2016    Historical  Provider, MD  metoprolol (LOPRESSOR) 50 MG tablet Take 1 tablet (50 mg total) by mouth 2 (two) times daily. PLEASE SCHEDULE APPOINTMENT FOR REFILLS. 11/10/15   Lennette Bihari, MD  metoprolol (LOPRESSOR) 50 MG tablet TAKE 1 TABLET(50 MG) BY MOUTH TWICE DAILY 02/06/16   Lennette Bihari, MD  traMADol (ULTRAM) 50 MG tablet Take 25-50 mg by mouth every 6 (six) hours as needed for pain.     Historical Provider, MD  warfarin (COUMADIN) 2.5 MG tablet TAKE 1 TABLET BY MOUTH DAILY AS DIRECTED BY COUMADIN CLINIC 07/04/15   Rollene Rotunda, MD  YUVAFEM 10 MCG TABS vaginal tablet UNWRAP AND INSERT 1 TABLET IN VAGINA 2 TIMES A WEEK 01/28/16   Ok Edwards, MD   BP 152/81 mmHg  Pulse 99  Temp(Src) 97.5 F (36.4 C) (Oral)  Resp 17  Ht 5\' 9"  (1.753 m)  Wt 68.692 kg  BMI 22.35 kg/m2  SpO2 95%   Physical Exam  Constitutional: She is oriented to person, place, and time. She appears well-developed and well-nourished.  HENT:  Head: Normocephalic and atraumatic.  Eyes: EOM are normal. Pupils are equal, round, and reactive to light.  Neck: Normal range of motion. Neck supple. No tracheal deviation present.  Cardiovascular: Normal rate, regular rhythm and normal heart sounds.   Pulmonary/Chest: Effort normal. She has wheezes in the right upper field, the right lower field, the left upper field and the left lower field.  Abdominal: Soft. There is no tenderness.  Musculoskeletal: Normal range of motion.  Neurological: She is alert and oriented to person, place, and time.  Skin: Skin is warm and dry.  Psychiatric: She has a normal mood and affect. Her behavior is normal. Thought content normal.  Nursing note and vitals reviewed.  ED Course  Procedures (including critical care time) Labs Review Labs Reviewed  COMPREHENSIVE METABOLIC PANEL - Abnormal; Notable for the following:    Chloride 98 (*)    Glucose, Bld 124 (*)    Total Bilirubin 1.3 (*)    All other components within normal limits  URINALYSIS,  ROUTINE W REFLEX MICROSCOPIC (NOT AT [facility]) - Abnormal; Notable for the following:    Color, Urine AMBER (*)    APPearance CLOUDY (*)    Hgb urine dipstick SMALL (*)    Protein, ur 30 (*)    Leukocytes, UA SMALL (*)    All other components within normal limits  CBC WITH DIFFERENTIAL/PLATELET - Abnormal; Notable for the following:    RBC 5.21 (*)    All other components within normal limits  URINE MICROSCOPIC-ADD ON - Abnormal; Notable for the following:    Squamous Epithelial / LPF 6-30 (*)  Bacteria, UA FEW (*)    All other components within normal limits  LIPASE, BLOOD    Imaging Review Dg Chest 2 View  02/27/2016  CLINICAL DATA:  Shortness of breath, wheezing. EXAM: CHEST  2 VIEW COMPARISON:  August 31, 2015. FINDINGS: Stable cardiomegaly. No pneumothorax or pleural effusion is noted. No acute pulmonary disease is noted. Left-sided pacemaker is unchanged in position. Osteophyte formation is noted in the lower thoracic spine. IMPRESSION: No active cardiopulmonary disease. Electronically Signed   By: Lupita Raider, M.D.   On: 02/27/2016 13:22   I have personally reviewed and evaluated these images and lab results as part of my medical decision-making.   EKG Interpretation None      MDM  I have reviewed and evaluated the relevant laboratory values.I have reviewed and evaluated the relevant imaging studies.I personally evaluated and interpreted the relevant EKG.I have reviewed the relevant previous healthcare records.I have reviewed EMS Documentation.I obtained HPI from historian. Patient discussed with supervising physician  ED Course:   Assessment: Pt is a 85yF with hx Bronchtis, Afib, CVA, DM, CAD, HTN, who presents with shortness of breath. On exam, pt in NAD. Nontoxic/nonseptic appearing. VSS. Afebrile. Not Hypoxic. Lungs CTA. No acute infilatrate. Heart RRR. Abdomen nontender soft. Labs no leukocytosis. Given Duoneb with improvement of symptoms. CXR showed no acute  infiltrate.  Plan is to DC home with tessalon, albuterol inhaler and have them continue ABX.  Given MDI with spacer in ED to take home. Follow up with PCP in a week. At time of discharge, Patient is in no acute distress. Vital Signs are stable. Patient is able to ambulate. Patient able to tolerate PO.    Disposition/Plan:  DC home Additional Verbal discharge instructions given and discussed with patient.  Pt Instructed to f/u with PCP in the next week for evaluation and treatment of symptoms. Return precautions given Pt acknowledges and agrees with plan  Supervising Physician Eber Hong, MD   Final diagnoses:  Bronchitis      Audry Pili, PA-C 02/27/16 1931  Eber Hong, MD 02/28/16 [phone]

## 2016-02-27 NOTE — ED Provider Notes (Signed)
The patient is an 82 year old female, she presents after having a respiratory illness causing wheezing and coughing, she has been seen by her family doctor and prescribed Keflex as well as Tessalon and feels as though she is not improving. Her symptoms are persistent, on my exam she has diffuse expiratory wheezing but speaks in full sentences and has no increased work of breathing. Her heart rate is regular, she has no tachycardia, she has no peripheral edema. Her labs and x-ray have been reviewed, there is no acute findings to suggest the need for further augmentation of her therapy other than adding albuterol MDI. The patient is in agreement with the plan, she has good follow-up with her family doctor, I have discussed all these results with the patient and her family members and they are in agreement with the verbal plan    Medical screening examination/treatment/procedure(s) were conducted as a shared visit with non-physician practitioner(s) and myself.  I personally evaluated the patient during the encounter.  Clinical Impression:   Final diagnoses:  Bronchitis         Eber Hong, MD 02/28/16 [phone]

## 2016-02-27 NOTE — ED Notes (Signed)
Pt ambulated to restroom. 

## 2016-02-27 NOTE — ED Notes (Signed)
Pt dx with bronchitis and started on keflex on 3/28.  Since then pt has increasing sob and weakness.

## 2016-02-28 DIAGNOSIS — J40 Bronchitis, not specified as acute or chronic: Secondary | ICD-10-CM

## 2016-02-28 HISTORY — DX: Bronchitis, not specified as acute or chronic: J40

## 2016-03-01 ENCOUNTER — Emergency Department (HOSPITAL_COMMUNITY): Payer: Medicare HMO

## 2016-03-01 ENCOUNTER — Encounter (HOSPITAL_COMMUNITY): Payer: Self-pay | Admitting: *Deleted

## 2016-03-01 ENCOUNTER — Inpatient Hospital Stay (HOSPITAL_COMMUNITY)
Admission: EM | Admit: 2016-03-01 | Discharge: 2016-03-04 | DRG: 191 | Disposition: A | Discharge status: Home / Self Care | Payer: Medicare HMO | Attending: Internal Medicine | Admitting: Internal Medicine

## 2016-03-01 DIAGNOSIS — I110 Hypertensive heart disease with heart failure: Secondary | ICD-10-CM | POA: Diagnosis present

## 2016-03-01 DIAGNOSIS — Z950 Presence of cardiac pacemaker: Secondary | ICD-10-CM

## 2016-03-01 DIAGNOSIS — I5022 Chronic systolic (congestive) heart failure: Secondary | ICD-10-CM | POA: Diagnosis present

## 2016-03-01 DIAGNOSIS — Z79899 Other long term (current) drug therapy: Secondary | ICD-10-CM | POA: Diagnosis not present

## 2016-03-01 DIAGNOSIS — Z888 Allergy status to other drugs, medicaments and biological substances status: Secondary | ICD-10-CM | POA: Diagnosis not present

## 2016-03-01 DIAGNOSIS — Z880 Allergy status to penicillin: Secondary | ICD-10-CM | POA: Diagnosis not present

## 2016-03-01 DIAGNOSIS — J440 Chronic obstructive pulmonary disease with acute lower respiratory infection: Principal | ICD-10-CM | POA: Diagnosis present

## 2016-03-01 DIAGNOSIS — I482 Chronic atrial fibrillation, unspecified: Secondary | ICD-10-CM | POA: Diagnosis present

## 2016-03-01 DIAGNOSIS — K589 Irritable bowel syndrome without diarrhea: Secondary | ICD-10-CM | POA: Diagnosis present

## 2016-03-01 DIAGNOSIS — I495 Sick sinus syndrome: Secondary | ICD-10-CM | POA: Diagnosis present

## 2016-03-01 DIAGNOSIS — T17890A Other foreign object in other parts of respiratory tract causing asphyxiation, initial encounter: Secondary | ICD-10-CM | POA: Diagnosis present

## 2016-03-01 DIAGNOSIS — E119 Type 2 diabetes mellitus without complications: Secondary | ICD-10-CM | POA: Diagnosis present

## 2016-03-01 DIAGNOSIS — J209 Acute bronchitis, unspecified: Secondary | ICD-10-CM | POA: Diagnosis present

## 2016-03-01 DIAGNOSIS — Z7901 Long term (current) use of anticoagulants: Secondary | ICD-10-CM

## 2016-03-01 DIAGNOSIS — Z7951 Long term (current) use of inhaled steroids: Secondary | ICD-10-CM

## 2016-03-01 DIAGNOSIS — Z8673 Personal history of transient ischemic attack (TIA), and cerebral infarction without residual deficits: Secondary | ICD-10-CM

## 2016-03-01 DIAGNOSIS — F419 Anxiety disorder, unspecified: Secondary | ICD-10-CM | POA: Diagnosis present

## 2016-03-01 DIAGNOSIS — Z882 Allergy status to sulfonamides status: Secondary | ICD-10-CM

## 2016-03-01 DIAGNOSIS — R0600 Dyspnea, unspecified: Secondary | ICD-10-CM | POA: Diagnosis not present

## 2016-03-01 DIAGNOSIS — Z955 Presence of coronary angioplasty implant and graft: Secondary | ICD-10-CM

## 2016-03-01 DIAGNOSIS — R0602 Shortness of breath: Secondary | ICD-10-CM | POA: Diagnosis present

## 2016-03-01 DIAGNOSIS — E039 Hypothyroidism, unspecified: Secondary | ICD-10-CM | POA: Diagnosis present

## 2016-03-01 DIAGNOSIS — I2510 Atherosclerotic heart disease of native coronary artery without angina pectoris: Secondary | ICD-10-CM | POA: Diagnosis not present

## 2016-03-01 DIAGNOSIS — M1990 Unspecified osteoarthritis, unspecified site: Secondary | ICD-10-CM | POA: Diagnosis present

## 2016-03-01 DIAGNOSIS — R0902 Hypoxemia: Secondary | ICD-10-CM

## 2016-03-01 DIAGNOSIS — J40 Bronchitis, not specified as acute or chronic: Secondary | ICD-10-CM

## 2016-03-01 HISTORY — DX: Bronchitis, not specified as acute or chronic: J40

## 2016-03-01 LAB — BASIC METABOLIC PANEL
Anion gap: 13 (ref 5–15)
BUN: 22 mg/dL — AB (ref 6–20)
CHLORIDE: 97 mmol/L — AB (ref 101–111)
CO2: 30 mmol/L (ref 22–32)
CREATININE: 0.73 mg/dL (ref 0.44–1.00)
Calcium: 9.3 mg/dL (ref 8.9–10.3)
GFR calc Af Amer: >60 mL/min (ref >60)
GFR calc non Af Amer: >60 mL/min (ref >60)
GLUCOSE: 147 mg/dL — AB (ref 65–99)
Potassium: 2.8 mmol/L — ABNORMAL LOW (ref 3.5–5.1)
Sodium: 140 mmol/L (ref 135–145)

## 2016-03-01 LAB — CBC
HEMATOCRIT: 44.5 % (ref 36.0–46.0)
HEMOGLOBIN: 14 g/dL (ref 12.0–15.0)
MCH: 27.5 pg (ref 26.0–34.0)
MCHC: 31.5 g/dL (ref 30.0–36.0)
MCV: 87.4 fL (ref 78.0–100.0)
Platelets: 178 10*3/uL (ref 150–400)
RBC: 5.09 MIL/uL (ref 3.87–5.11)
RDW: 14.5 % (ref 11.5–15.5)
WBC: 8.7 10*3/uL (ref 4.0–10.5)

## 2016-03-01 LAB — BRAIN NATRIURETIC PEPTIDE: B Natriuretic Peptide: 40.9 pg/mL (ref 0.0–100.0)

## 2016-03-01 LAB — TROPONIN I: Troponin I: <0.03 ng/mL (ref <0.031)

## 2016-03-01 MED ORDER — METHYLPREDNISOLONE SODIUM SUCC 125 MG IJ SOLR
125.0000 mg | Freq: Once | INTRAMUSCULAR | Status: AC
  Administered 2016-03-01: 125 mg via INTRAVENOUS
  Filled 2016-03-01: qty 2

## 2016-03-01 MED ORDER — ALBUTEROL SULFATE (2.5 MG/3ML) 0.083% IN NEBU
2.5000 mg | INHALATION_SOLUTION | Freq: Four times a day (QID) | RESPIRATORY_TRACT | Status: DC
  Administered 2016-03-02: 2.5 mg via RESPIRATORY_TRACT
  Filled 2016-03-01: qty 3

## 2016-03-01 MED ORDER — AZITHROMYCIN 250 MG PO TABS
500.0000 mg | ORAL_TABLET | Freq: Once | ORAL | Status: AC
  Administered 2016-03-01: 500 mg via ORAL
  Filled 2016-03-01: qty 2

## 2016-03-01 MED ORDER — ALBUTEROL (5 MG/ML) CONTINUOUS INHALATION SOLN
10.0000 mg/h | INHALATION_SOLUTION | RESPIRATORY_TRACT | Status: AC
  Administered 2016-03-01: 10 mg/h via RESPIRATORY_TRACT
  Filled 2016-03-01: qty 20

## 2016-03-01 NOTE — Care Management Note (Signed)
Case Management Note  Patient Details  Name: [name] MRN: [id number] Date of Birth: [birth year]  Subjective/Objective:             Patient presented to [facility] ED with c/o SOB and wheezing, Patient was recently seen in the ED 3/31 for related complaint      Action/Plan: ED CM met with patient and family at bedside to discuss discharge planning. Patient states, she lives at home with husband who does not drive, they have have two sons who live in [place] but in other cities. They assist with transportation to doctors appointments, Discussed with patient the recommendations for [facility] services, patient declined. Dr. Sabra Heck EDP recommending home neb treatments, discussed with this with patient, she is agreeable. DME neb machine will be ordered prior to discharge.   Expected Discharge Date:                  Expected Discharge Plan:  Home/Self Care  In-House Referral:     Discharge planning Services  CM Consult  Post Acute Care Choice:    Choice offered to:  Patient, Spouse  DME Arranged:  Nebulizer machine DME Agency:  [place]:  NA [place] Agency:     Status of Service:  In process, will continue to follow  Medicare Important Message Given:    Date Medicare IM Given:    Medicare IM give by:    Date Additional Medicare IM Given:    Additional Medicare Important Message give by:     If discussed at [place] of Stay Meetings, dates discussed:    Additional CommentsLaurena Slimmer, RN 03/01/2016, 10:45 PM

## 2016-03-01 NOTE — ED Notes (Signed)
Pt oxygen saturation has dropped to the mid 80's. This RN placed pt on 2L of oxygen.

## 2016-03-01 NOTE — ED Notes (Signed)
Pt reports being here on Friday for cough and possible pneumonia. Was dc home but told to return if anything worsens. Pt reports being up all night due to cough. It is a non productive cough, denies fever. Having decreased appetite and now extreme fatigue, difficulty getting up to use restroom.

## 2016-03-01 NOTE — ED Provider Notes (Signed)
CSN: [id number]     Arrival date & time 03/01/16  1224 History   First MD Initiated Contact with Patient 03/01/16 1851     Chief Complaint  Patient presents with  . Cough  . Fatigue     (Consider location/radiation/quality/duration/timing/severity/associated sxs/prior Treatment) HPI  The patient is an 82 year old female, she has a prior history of COPD according to her report. She also reports that she has recently been diagnosed with an upper respiratory infection at her doctor's office and given a prescription for cephalexin which she has taken intermittently but not for the last 2 days. I saw the patient several days ago upon her first visit to the emergency Department during which visit she had some coughing and wheezing but otherwise was doing well. She was given an albuterol inhaler to complement her home medications, antibiotics and antitussives but has not had much success with using it. She feels like it is not working. Last night she had excessive increased work of breathing according to the patient however she was able to speak, swallow and has had some ongoing coughing throughout the day though it does not seem to be as bad as it was last night. No fevers, no swelling, symptoms seem to be fluctuating worse in the night and better in the day but over the last week they have been fairly persistent. She does not smoke, she has no history of asthma or childhood lung disease.  Past Medical History  Diagnosis Date  . A-fib (HCC)   . Stroke (HCC)   . Pacemaker   . Arthritis   . Diabetes mellitus     x 10 yrs at least  . Anxiety   . Polio   . Hx of echocardiogram 09/2012    showed Ef 35-40% with no significant valve disease except for mild to moderate MR. normal RVSP and left atrium was severly dilated.  Marland Kitchen History of stress test 08/2011    negative for ischemia it was nongated and nondiagnostic and compatible with a possible scar or peri-infraction ischemia.   . Coronary artery  disease 12/2007  . Hypertension   . Thyroid disease     hypothyroidism   Past Surgical History  Procedure Laterality Date  . Cardiac surgery    . 3 cardiac stents      she has had non-DES stent to the mid circumflex in 2009 and LAD stent to the mid-distal LAD  BMS June 18, 2010  . Abdominal hysterectomy  1978    TAH/BSO  . Tonsillectomy    . Appendectomy    . Pacemaker placement  08/11/2010    Medtronic Adapta   . Coronary angioplasty    . Left heart catheterization with coronary angiogram N/A 05/10/2013    Procedure: LEFT HEART CATHETERIZATION WITH CORONARY ANGIOGRAM;  Surgeon: Lennette Bihari, MD;  Location: [facility] CATH LAB;  Service: Cardiovascular;  Laterality: N/A;  . Transthoracic echocardiogram  01/17/2009    EF=>55%.IMPAIRED LV RELAXATION.LA IS MODERATELY DILATED.RA IS MILDLY DILATED. RV SYSTOLIC PRESSURE IS ELEVATED AT 30-40MMHG.MILD TO MODERATE TR.MILD MR.SINCE PRIOR TTE,BOTH ATRIA APPEAR TO BE MORE DILATED.  . Stress perfusion test  10/22/2009    MILD ISCHEMIA IN THE BASAL ANTEROLATERAL AND MID ANTEROLATERAL REGIONS.EF 52%.   Family History  Problem Relation Age of Onset  . Heart disease Father   . Cancer Mother    Social History  Substance Use Topics  . Smoking status: Never Smoker   . Smokeless tobacco: Never Used  . Alcohol Use: No  OB History    Gravida Para Term Preterm AB TAB SAB Ectopic Multiple Living   3 3        3      Review of Systems  All other systems reviewed and are negative.     Allergies  Iohexol; Penicillins; and Sulfa antibiotics  Home Medications   Prior to Admission medications   Medication Sig Start Date End Date Taking? Authorizing Provider  albuterol (PROVENTIL HFA;VENTOLIN HFA) 108 (90 Base) MCG/ACT inhaler Inhale 1-2 puffs into the lungs every 6 (six) hours as needed for wheezing or shortness of breath. 02/27/16  Yes Audry Pili, PA-C  ALPRAZolam Prudy Feeler) 0.25 MG tablet Take 1 tablet (0.25 mg total) by mouth 2 (two) times  daily. Patient taking differently: Take 0.25 mg by mouth at bedtime.  03/28/15  Yes Veryl Speak, FNP  benzonatate (TESSALON) 100 MG capsule Take 1 capsule (100 mg total) by mouth 3 (three) times daily as needed for cough. 02/27/16  Yes Audry Pili, PA-C  cephALEXin (KEFLEX) 500 MG capsule Take 500 mg by mouth 3 (three) times daily.   Yes Historical Provider, MD  dicyclomine (BENTYL) 10 MG capsule 1 capsule by mouth twice daily as needed for abdominal pain or cramps. 05/26/15  Yes Malissa Hippo, MD  DIGOX 125 MCG tablet TAKE 1 TABLET BY MOUTH DAILY 08/22/15  Yes Lennette Bihari, MD  isosorbide mononitrate (IMDUR) 30 MG 24 hr tablet TAKE 1 TABLET BY MOUTH DAILY 02/02/16  Yes Mihai Croitoru, MD  levothyroxine (SYNTHROID, LEVOTHROID) 100 MCG tablet TAKE 1 TABLET BY MOUTH EVERY DAY 04/08/15  Yes Malissa Hippo, MD  metoprolol (LOPRESSOR) 50 MG tablet TAKE 1 TABLET(50 MG) BY MOUTH TWICE DAILY 02/06/16  Yes Lennette Bihari, MD  traMADol (ULTRAM) 50 MG tablet Take 25-50 mg by mouth every 6 (six) hours as needed for pain.    Yes Historical Provider, MD  warfarin (COUMADIN) 2.5 MG tablet TAKE 1 TABLET BY MOUTH DAILY AS DIRECTED BY COUMADIN CLINIC Patient taking differently: TAKES 1/2 TAB BY MOUTH IN EVENINGS ALL DAYS OF WEEK 07/04/15  Yes Rollene Rotunda, MD  YUVAFEM 10 MCG TABS vaginal tablet UNWRAP AND INSERT 1 TABLET IN VAGINA 2 TIMES A WEEK 01/28/16  Yes Ok Edwards, MD   BP 161/81 mmHg  Pulse 98  Temp(Src) 97.8 F (36.6 C) (Oral)  Resp 14  SpO2 90% Physical Exam  Constitutional: She appears well-developed and well-nourished. No distress.  HENT:  Head: Normocephalic and atraumatic.  Mouth/Throat: Oropharynx is clear and moist. No oropharyngeal exudate.  Eyes: Conjunctivae and EOM are normal. Pupils are equal, round, and reactive to light. Right eye exhibits no discharge. Left eye exhibits no discharge. No scleral icterus.  Neck: Normal range of motion. Neck supple. No JVD present. No thyromegaly  present.  Cardiovascular: Normal rate, regular rhythm, normal heart sounds and intact distal pulses.  Exam reveals no gallop and no friction rub.   No murmur heard. Pulmonary/Chest: Effort normal. No respiratory distress. She has wheezes. She has no rales.  The patient has a normal respiratory rate, she speaks in full sentences without any distress or increased work of breathing or accessory muscle use. She has clear breath sounds without rales however on forced expiration she does have diffuse expiratory wheezing.  Abdominal: Soft. Bowel sounds are normal. She exhibits no distension and no mass. There is no tenderness.  Musculoskeletal: Normal range of motion. She exhibits no edema or tenderness.  Lymphadenopathy:    She has no  cervical adenopathy.  Neurological: She is alert. Coordination normal.  Skin: Skin is warm and dry. No rash noted. No erythema.  Psychiatric: She has a normal mood and affect. Her behavior is normal.  Nursing note and vitals reviewed.   ED Course  Procedures (including critical care time) Labs Review Labs Reviewed  CBC  BASIC METABOLIC PANEL  TROPONIN I  BRAIN NATRIURETIC PEPTIDE    Imaging Review Dg Chest 2 View  03/01/2016  CLINICAL DATA:  82 year old female with cough over the past 3 days. Subsequent encounter. EXAM: CHEST  2 VIEW COMPARISON:  02/27/2016, 08/31/2015 and 01/09/2013. FINDINGS: Pacemaker in place with lead unchanged in position. Heart slightly enlarged. Aorta minimally tortuous. Central pulmonary vascular prominence stable without pulmonary edema. Nodule right lung base consistent with nipple shadow noted on multiple prior exams. No pulmonary mass detected on plain film exam. No segmental infiltrate. IMPRESSION: No infiltrate detected. Pacemaker in place.  Mild cardiomegaly. Electronically Signed   By: Lacy Duverney M.D.   On: 03/01/2016 14:30   I have personally reviewed and evaluated these images and lab results as part of my medical  decision-making.    MDM   Final diagnoses:  Hypoxia  Bronchitis    The patient is a very clear oropharynx, there does not appear to be any signs of airway obstruction, she does have expiratory wheezing which seems to be consistent with reactive airway disease. At this point I would think that this is infectious however her chest x-ray has been persistently negative. The question now becomes whether the etiology of her symptoms should be treated with an antibiotic beyond a first generation cephalosporin which she has only been taking intermittently. With her age and comorbidities and ongoing symptoms I would suggest that using Zithromax in addition to her home medications would be of some improvement as well as supplying the patient with a prescription for a nebulizer for ease of use. The patient and family members are in agreement with the plan.  I have personally viewed and interpreted the x-ray images and I find no signs of infection. I agree with the radiologist interpretation.  Discussed care with the family members, she is now more hypoxic with an oxygen level of approximately 88% on room air with increased wheezing and increased tachypnea. Chest x-ray reviewed, discussed with the hospitalist who will admit the patient for ongoing respiratory distress with bronchitis. Solu-Medrol added  Dr. Toniann Fail to admit  I have personally viewed and interpreted the imaging and agree with radiologist interpretation.     Eber Hong, MD 03/01/16 2240

## 2016-03-02 ENCOUNTER — Encounter (HOSPITAL_COMMUNITY): Payer: Self-pay | Admitting: Internal Medicine

## 2016-03-02 ENCOUNTER — Inpatient Hospital Stay (HOSPITAL_COMMUNITY): Payer: Medicare HMO

## 2016-03-02 ENCOUNTER — Encounter: Payer: Self-pay | Admitting: Gynecology

## 2016-03-02 DIAGNOSIS — J40 Bronchitis, not specified as acute or chronic: Secondary | ICD-10-CM

## 2016-03-02 DIAGNOSIS — I2510 Atherosclerotic heart disease of native coronary artery without angina pectoris: Secondary | ICD-10-CM

## 2016-03-02 DIAGNOSIS — J208 Acute bronchitis due to other specified organisms: Secondary | ICD-10-CM

## 2016-03-02 DIAGNOSIS — J209 Acute bronchitis, unspecified: Secondary | ICD-10-CM | POA: Diagnosis present

## 2016-03-02 DIAGNOSIS — E038 Other specified hypothyroidism: Secondary | ICD-10-CM

## 2016-03-02 LAB — CBC
HEMATOCRIT: 44.1 % (ref 36.0–46.0)
Hemoglobin: 14.1 g/dL (ref 12.0–15.0)
MCH: 27.7 pg (ref 26.0–34.0)
MCHC: 32 g/dL (ref 30.0–36.0)
MCV: 86.6 fL (ref 78.0–100.0)
Platelets: 172 10*3/uL (ref 150–400)
RBC: 5.09 MIL/uL (ref 3.87–5.11)
RDW: 14.5 % (ref 11.5–15.5)
WBC: 8 10*3/uL (ref 4.0–10.5)

## 2016-03-02 LAB — INFLUENZA PANEL BY PCR (TYPE A & B)
H1N1 flu by pcr: NOT DETECTED
INFLAPCR: NEGATIVE
Influenza B By PCR: NEGATIVE

## 2016-03-02 LAB — BASIC METABOLIC PANEL
Anion gap: 16 — ABNORMAL HIGH (ref 5–15)
BUN: 21 mg/dL — ABNORMAL HIGH (ref 6–20)
CHLORIDE: 98 mmol/L — AB (ref 101–111)
CO2: 25 mmol/L (ref 22–32)
CREATININE: 0.79 mg/dL (ref 0.44–1.00)
Calcium: 9.2 mg/dL (ref 8.9–10.3)
GFR calc non Af Amer: >60 mL/min (ref >60)
Glucose, Bld: 232 mg/dL — ABNORMAL HIGH (ref 65–99)
POTASSIUM: 3.1 mmol/L — AB (ref 3.5–5.1)
SODIUM: 139 mmol/L (ref 135–145)

## 2016-03-02 LAB — HEPATIC FUNCTION PANEL
ALBUMIN: 3.7 g/dL (ref 3.5–5.0)
ALK PHOS: 79 U/L (ref 38–126)
ALT: 17 U/L (ref 14–54)
AST: 27 U/L (ref 15–41)
BILIRUBIN DIRECT: 0.1 mg/dL (ref 0.1–0.5)
BILIRUBIN TOTAL: 0.8 mg/dL (ref 0.3–1.2)
Indirect Bilirubin: 0.7 mg/dL (ref 0.3–0.9)
Total Protein: 6.5 g/dL (ref 6.5–8.1)

## 2016-03-02 LAB — TROPONIN I
Troponin I: <0.03 ng/mL (ref <0.031)
Troponin I: <0.03 ng/mL (ref <0.031)
Troponin I: <0.03 ng/mL (ref <0.031)

## 2016-03-02 LAB — LIPASE, BLOOD: LIPASE: 20 U/L (ref 11–51)

## 2016-03-02 LAB — PROTIME-INR
INR: 3.06 — ABNORMAL HIGH (ref 0.00–1.49)
Prothrombin Time: 31.1 seconds — ABNORMAL HIGH (ref 11.6–15.2)

## 2016-03-02 MED ORDER — ACETAMINOPHEN 650 MG RE SUPP: 650.0000 mg | Freq: Four times a day (QID) | RECTAL | Status: DC | PRN

## 2016-03-02 MED ORDER — ALPRAZOLAM 0.25 MG PO TABS
0.2500 mg | ORAL_TABLET | Freq: Every day | ORAL | Status: DC
  Administered 2016-03-02 – 2016-03-03 (×2): 0.25 mg via ORAL
  Filled 2016-03-02 (×2): qty 1

## 2016-03-02 MED ORDER — DIGOXIN 125 MCG PO TABS
125.0000 ug | ORAL_TABLET | Freq: Every day | ORAL | Status: DC
  Administered 2016-03-02 – 2016-03-04 (×3): 125 ug via ORAL
  Filled 2016-03-02 (×3): qty 1

## 2016-03-02 MED ORDER — ONDANSETRON HCL 4 MG/2ML IJ SOLN: 4.0000 mg | Freq: Four times a day (QID) | INTRAMUSCULAR | Status: DC | PRN

## 2016-03-02 MED ORDER — DOXYCYCLINE HYCLATE 100 MG IV SOLR
100.0000 mg | Freq: Two times a day (BID) | INTRAVENOUS | Status: DC
  Administered 2016-03-02 (×2): 100 mg via INTRAVENOUS
  Filled 2016-03-02 (×4): qty 100

## 2016-03-02 MED ORDER — BUDESONIDE 0.5 MG/2ML IN SUSP
0.2500 mg | Freq: Two times a day (BID) | RESPIRATORY_TRACT | Status: DC
Start: 2016-03-02 — End: 2016-03-04
  Administered 2016-03-02 – 2016-03-03 (×2): 0.25 mg via RESPIRATORY_TRACT
  Filled 2016-03-02 (×4): qty 2

## 2016-03-02 MED ORDER — MAGNESIUM SULFATE 2 GM/50ML IV SOLN
2.0000 g | Freq: Once | INTRAVENOUS | Status: AC
  Administered 2016-03-02: 2 g via INTRAVENOUS
  Filled 2016-03-02: qty 50

## 2016-03-02 MED ORDER — LEVOTHYROXINE SODIUM 100 MCG PO TABS
100.0000 ug | ORAL_TABLET | Freq: Every day | ORAL | Status: DC
  Administered 2016-03-02 – 2016-03-04 (×3): 100 ug via ORAL
  Filled 2016-03-02 (×4): qty 1

## 2016-03-02 MED ORDER — PREDNISONE 20 MG PO TABS
40.0000 mg | ORAL_TABLET | Freq: Every day | ORAL | Status: DC
Start: 2016-03-02 — End: 2016-03-04
  Administered 2016-03-03 – 2016-03-04 (×2): 40 mg via ORAL
  Filled 2016-03-02 (×3): qty 2

## 2016-03-02 MED ORDER — LEVOFLOXACIN 500 MG PO TABS
500.0000 mg | ORAL_TABLET | Freq: Every day | ORAL | Status: DC
  Administered 2016-03-02 – 2016-03-04 (×3): 500 mg via ORAL
  Filled 2016-03-02 (×3): qty 1

## 2016-03-02 MED ORDER — ALBUTEROL SULFATE (2.5 MG/3ML) 0.083% IN NEBU
2.5000 mg | INHALATION_SOLUTION | Freq: Four times a day (QID) | RESPIRATORY_TRACT | Status: DC
  Administered 2016-03-03 (×2): 2.5 mg via RESPIRATORY_TRACT
  Filled 2016-03-02 (×2): qty 3

## 2016-03-02 MED ORDER — ONDANSETRON HCL 4 MG PO TABS: 4.0000 mg | ORAL_TABLET | Freq: Four times a day (QID) | ORAL | Status: DC | PRN

## 2016-03-02 MED ORDER — TRAMADOL HCL 50 MG PO TABS: 25.0000 mg | ORAL_TABLET | Freq: Four times a day (QID) | ORAL | Status: DC | PRN

## 2016-03-02 MED ORDER — BENZONATATE 100 MG PO CAPS: 100.0000 mg | ORAL_CAPSULE | Freq: Three times a day (TID) | ORAL | Status: DC | PRN

## 2016-03-02 MED ORDER — ALBUTEROL SULFATE (2.5 MG/3ML) 0.083% IN NEBU
2.5000 mg | INHALATION_SOLUTION | RESPIRATORY_TRACT | Status: DC
  Administered 2016-03-02 (×2): 2.5 mg via RESPIRATORY_TRACT
  Filled 2016-03-02 (×3): qty 3

## 2016-03-02 MED ORDER — ISOSORBIDE MONONITRATE ER 30 MG PO TB24
30.0000 mg | ORAL_TABLET | Freq: Every day | ORAL | Status: DC
  Administered 2016-03-02 – 2016-03-04 (×3): 30 mg via ORAL
  Filled 2016-03-02 (×4): qty 1

## 2016-03-02 MED ORDER — POTASSIUM CHLORIDE CRYS ER 20 MEQ PO TBCR
40.0000 meq | EXTENDED_RELEASE_TABLET | Freq: Once | ORAL | Status: AC
  Administered 2016-03-02: 40 meq via ORAL
  Filled 2016-03-02 (×2): qty 2

## 2016-03-02 MED ORDER — SACCHAROMYCES BOULARDII 250 MG PO CAPS
250.0000 mg | ORAL_CAPSULE | Freq: Two times a day (BID) | ORAL | Status: DC
  Administered 2016-03-02 – 2016-03-04 (×6): 250 mg via ORAL
  Filled 2016-03-02 (×8): qty 1

## 2016-03-02 MED ORDER — ACETAMINOPHEN 325 MG PO TABS
650.0000 mg | ORAL_TABLET | Freq: Four times a day (QID) | ORAL | Status: DC | PRN
  Administered 2016-03-02: 650 mg via ORAL
  Filled 2016-03-02: qty 2

## 2016-03-02 MED ORDER — ALBUTEROL SULFATE (2.5 MG/3ML) 0.083% IN NEBU: 2.5000 mg | INHALATION_SOLUTION | RESPIRATORY_TRACT | Status: DC | PRN

## 2016-03-02 MED ORDER — DICYCLOMINE HCL 10 MG PO CAPS: 10.0000 mg | ORAL_CAPSULE | Freq: Two times a day (BID) | ORAL | Status: DC | PRN

## 2016-03-02 MED ORDER — WARFARIN SODIUM 1 MG PO TABS
1.0000 mg | ORAL_TABLET | Freq: Once | ORAL | Status: DC
  Filled 2016-03-02: qty 1

## 2016-03-02 MED ORDER — METOPROLOL TARTRATE 50 MG PO TABS
50.0000 mg | ORAL_TABLET | Freq: Two times a day (BID) | ORAL | Status: DC
  Administered 2016-03-02 – 2016-03-04 (×6): 50 mg via ORAL
  Filled 2016-03-02: qty 2
  Filled 2016-03-02 (×2): qty 1
  Filled 2016-03-02: qty 2
  Filled 2016-03-02 (×2): qty 1

## 2016-03-02 MED ORDER — WARFARIN - PHARMACIST DOSING INPATIENT: Freq: Every day | Status: DC

## 2016-03-02 MED ORDER — POTASSIUM CHLORIDE IN NACL 40-0.9 MEQ/L-% IV SOLN
INTRAVENOUS | Status: DC
  Administered 2016-03-02 – 2016-03-03 (×2): 75 mL/h via INTRAVENOUS
  Filled 2016-03-02 (×4): qty 1000

## 2016-03-02 NOTE — H&P (Signed)
[facility] History and Physical  [name] ZOX:[id number] DOB: 05/16/1930 DOA: 03/01/2016  Referring physician: Dr. Hyacinth Meeker. PCP: No PCP Per Patient  Specialists: [facility] cardiology.  Chief Complaint: Cough and shortness of breath.  HPI: [name] is a 82 y.o. female with history of CAD status post stenting, chronic atrial fibrillation, sick sinus syndrome status post pacemaker placement, hypothyroidism has been experiencing cough with shortness of breath over the last 1 week. Patient had originally gone to her primary care physician and was prescribed Keflex last week. Patient had missed some doses of Keflex. Patient did come to the ER 3 days ago and at that time was advised to continue with the medication. Despite which patient was still having shortness of breath. Patient was not able to use the inhaler well. In the ER patient was found to be wheezing chest x-ray does not show any infiltrates. Patient is being admitted for acute bronchitis.   Review of Systems: As presented in the history of presenting illness, rest negative.  Past Medical History  Diagnosis Date  . A-fib (HCC)   . Stroke (HCC)   . Pacemaker   . Arthritis   . Diabetes mellitus     x 10 yrs at least  . Anxiety   . Polio   . Hx of echocardiogram 09/2012    showed Ef 35-40% with no significant valve disease except for mild to moderate MR. normal RVSP and left atrium was severly dilated.  Marland Kitchen History of stress test 08/2011    negative for ischemia it was nongated and nondiagnostic and compatible with a possible scar or peri-infraction ischemia.   . Coronary artery disease 12/2007  . Hypertension   . Thyroid disease     hypothyroidism   Past Surgical History  Procedure Laterality Date  . Cardiac surgery    . 3 cardiac stents      she has had non-DES stent to the mid circumflex in 2009 and LAD stent to the mid-distal LAD  BMS June 18, 2010  . Abdominal hysterectomy  1978    TAH/BSO  . Tonsillectomy    .  Appendectomy    . Pacemaker placement  08/11/2010    Medtronic Adapta   . Coronary angioplasty    . Left heart catheterization with coronary angiogram N/A 05/10/2013    Procedure: LEFT HEART CATHETERIZATION WITH CORONARY ANGIOGRAM;  Surgeon: Lennette Bihari, MD;  Location: [facility] CATH LAB;  Service: Cardiovascular;  Laterality: N/A;  . Transthoracic echocardiogram  01/17/2009    EF=>55%.IMPAIRED LV RELAXATION.LA IS MODERATELY DILATED.RA IS MILDLY DILATED. RV SYSTOLIC PRESSURE IS ELEVATED AT 30-40MMHG.MILD TO MODERATE TR.MILD MR.SINCE PRIOR TTE,BOTH ATRIA APPEAR TO BE MORE DILATED.  . Stress perfusion test  10/22/2009    MILD ISCHEMIA IN THE BASAL ANTEROLATERAL AND MID ANTEROLATERAL REGIONS.EF 52%.   Social History:  reports that she has never smoked. She has never used smokeless tobacco. She reports that she does not drink alcohol or use illicit drugs. Where does patient live at home. Can patient participate in ADLs? Yes.  Allergies  Allergen Reactions  . Iohexol Other (See Comments)     Desc: CHEST PAIN, LOC AFTER HEART CATH, PT REFUSED DYE   . Penicillins Other (See Comments)    Told not to take medication  . Sulfa Antibiotics Other (See Comments)    Told not to take med    Family History:  Family History  Problem Relation Age of Onset  . Heart disease Father   . Cancer  Mother       Prior to Admission medications   Medication Sig Start Date End Date Taking? Authorizing Provider  albuterol (PROVENTIL HFA;VENTOLIN HFA) 108 (90 Base) MCG/ACT inhaler Inhale 1-2 puffs into the lungs every 6 (six) hours as needed for wheezing or shortness of breath. 02/27/16  Yes Audry Pili, PA-C  ALPRAZolam Prudy Feeler) 0.25 MG tablet Take 1 tablet (0.25 mg total) by mouth 2 (two) times daily. Patient taking differently: Take 0.25 mg by mouth at bedtime.  03/28/15  Yes Veryl Speak, FNP  benzonatate (TESSALON) 100 MG capsule Take 1 capsule (100 mg total) by mouth 3 (three) times daily as needed for cough.  02/27/16  Yes Audry Pili, PA-C  cephALEXin (KEFLEX) 500 MG capsule Take 500 mg by mouth 3 (three) times daily.   Yes Historical Provider, MD  dicyclomine (BENTYL) 10 MG capsule 1 capsule by mouth twice daily as needed for abdominal pain or cramps. 05/26/15  Yes Malissa Hippo, MD  DIGOX 125 MCG tablet TAKE 1 TABLET BY MOUTH DAILY 08/22/15  Yes Lennette Bihari, MD  isosorbide mononitrate (IMDUR) 30 MG 24 hr tablet TAKE 1 TABLET BY MOUTH DAILY 02/02/16  Yes Mihai Croitoru, MD  levothyroxine (SYNTHROID, LEVOTHROID) 100 MCG tablet TAKE 1 TABLET BY MOUTH EVERY DAY 04/08/15  Yes Malissa Hippo, MD  metoprolol (LOPRESSOR) 50 MG tablet TAKE 1 TABLET(50 MG) BY MOUTH TWICE DAILY 02/06/16  Yes Lennette Bihari, MD  traMADol (ULTRAM) 50 MG tablet Take 25-50 mg by mouth every 6 (six) hours as needed for pain.    Yes Historical Provider, MD  warfarin (COUMADIN) 2.5 MG tablet TAKE 1 TABLET BY MOUTH DAILY AS DIRECTED BY COUMADIN CLINIC Patient taking differently: TAKES 1/2 TAB BY MOUTH IN EVENINGS ALL DAYS OF WEEK 07/04/15  Yes Rollene Rotunda, MD  YUVAFEM 10 MCG TABS vaginal tablet UNWRAP AND INSERT 1 TABLET IN VAGINA 2 TIMES A WEEK 01/28/16  Yes Ok Edwards, MD    Physical Exam: Filed Vitals:   03/02/16 0000 03/02/16 0045 03/02/16 0115 03/02/16 0200  BP: 135/66     Pulse: 91 80 92   Temp:      TempSrc:      Resp: 24 24 21    SpO2: 94% 94% 93% 92%     General:  Moderately built and poorly nourished.  Eyes: Anicteric no pallor.  ENT: No discharge from ears eyes nose and mouth.  Neck: No mass felt. No JVD appreciated.  Cardiovascular: S1-S2 heard.  Respiratory: Mild expiratory wheeze and no crepitations.  Abdomen: Soft nontender bowel sounds present.  Skin: No rash.  Musculoskeletal: No edema.  Psychiatric: Appears normal.  Neurologic: Alert awake oriented to time place and person. Moves all extremities.  Labs on Admission:  Basic Metabolic Panel:  Recent Labs Lab 02/27/16 1246  03/01/16 2255  NA 137 140  K 4.3 2.8*  CL 98* 97*  CO2 28 30  GLUCOSE 124* 147*  BUN 17 22*  CREATININE 0.64 0.73  CALCIUM 9.1 9.3   Liver Function Tests:  Recent Labs Lab 02/27/16 1246  AST 21  ALT 15  ALKPHOS 87  BILITOT 1.3*  PROT 6.7  ALBUMIN 3.8    Recent Labs Lab 02/27/16 1246  LIPASE 20   No results for input(s): AMMONIA in the last 168 hours. CBC:  Recent Labs Lab 02/27/16 1246 03/01/16 2255  WBC 5.2 8.7  NEUTROABS 3.4  --   HGB 14.9 14.0  HCT 45.7 44.5  MCV 87.7 87.4  PLT 183 178   Cardiac Enzymes:  Recent Labs Lab 03/01/16 2255  TROPONINI <0.03    BNP (last 3 results)  Recent Labs  03/01/16 2255  BNP 40.9    ProBNP (last 3 results) No results for input(s): PROBNP in the last 8760 hours.  CBG: No results for input(s): GLUCAP in the last 168 hours.  Radiological Exams on Admission: Dg Chest 2 View  03/01/2016  CLINICAL DATA:  82 year old female with cough over the past 3 days. Subsequent encounter. EXAM: CHEST  2 VIEW COMPARISON:  02/27/2016, 08/31/2015 and 01/09/2013. FINDINGS: Pacemaker in place with lead unchanged in position. Heart slightly enlarged. Aorta minimally tortuous. Central pulmonary vascular prominence stable without pulmonary edema. Nodule right lung base consistent with nipple shadow noted on multiple prior exams. No pulmonary mass detected on plain film exam. No segmental infiltrate. IMPRESSION: No infiltrate detected. Pacemaker in place.  Mild cardiomegaly. Electronically Signed   By: Lacy Duverney M.D.   On: 03/01/2016 14:30    EKG: Independently reviewed. A. fib rate controlled with PVCs.  Assessment/Plan Principal Problem:   Acute bronchitis Active Problems:   CAD, non DES to CFX '09, LAD BMS 7/11. Cath OK June 2014   Chronic atrial fibrillation [facility])   Hypothyroidism   SSS (sick sinus syndrome)- MDT PTVDP 9/11   1. Acute bronchitis - at this time I have placed patient on doxycycline Pulmicort prednisone  and albuterol nebulizer. Check influenza PCR. 2. CAD status post stenting - patient gets chest pain off and on probably related to her coughing. Check cardiac markers. 3. Chronic atrial fibrillation and sick sinus syndrome status post pacemaker placement - chads 2 vasc score is more than 2. Patient is on digoxin and metoprolol. Coumadin per pharmacy. 4. Hypothyroidism on Synthroid. 5. History of systolic heart failure but last EF measured in 2015 was 60-65%. 6. History of IBS - has chronic epigastric discomfort. Check LFTs.   DVT Prophylaxis Coumadin.  Code Status: Full code.  Family Communication: Patient's husband.  Disposition Plan: Admit to inpatient.    Keora Eccleston N. [facility] Pager [phone].  If 7PM-7AM, please contact night-coverage [email or web address] Password [email or web address] 03/02/2016, 2:29 AM

## 2016-03-02 NOTE — ED Notes (Signed)
A regular diet ordered for lunch. 

## 2016-03-02 NOTE — Progress Notes (Signed)
ANTICOAGULATION CONSULT NOTE - Follow Up Consult  Pharmacy Consult for Warfarin  Indication: atrial fibrillation   Vital Signs: Temp: 97.4 F (36.3 C) (04/04 1147) Temp Source: Oral (04/04 1147) BP: 137/93 mmHg (04/04 1400) Pulse Rate: 66 (04/04 1415)  Labs:  Recent Labs  03/01/16 2255 03/02/16 0312 03/02/16 0828  HGB 14.0 14.1  --   HCT 44.5 44.1  --   PLT 178 172  --   LABPROT  --  31.1*  --   INR  --  3.06*  --   CREATININE 0.73 0.79  --   TROPONINI <0.03 <0.03 <0.03    Estimated Creatinine Clearance: 55.7 mL/min (by C-G formula based on Cr of 0.79).  Assessment: 82 y/o F here with cough/shortness of breath, on chronic warfarin as an outpatient for CAF.  CBC WNL, no bleeding reported. INR 3.06 Coum clinic note 01/27/16 INR goal 2-3 INR 1.8, normally takes 1/2 tablet daily = 1.25 mg, told to take 1 tablet = 2.5 mg x 1 dose then resume 1/2 tab = 1.25 daily recheck at [facility] Per med rec dose 1.25 qhs. Last dose 02/29/16 INR 3.06, CBC WNL, INR at high end of goal.  Goal of Therapy:  INR 2-3 Monitor platelets by anticoagulation protocol: Yes   Plan: coumadin 1 mg pox 1 dose today Daily INR  Herby Abraham, Pharm.D. [phone] 03/02/2016 3:13 PM

## 2016-03-02 NOTE — Progress Notes (Addendum)
Patient seen and examined  82 y.o. female with history of CAD status post stenting, chronic atrial fibrillation, sick sinus syndrome status post pacemaker placement, hypothyroidism has been experiencing cough with shortness of breath over the last 1 week.  In the ER patient was found to be wheezing chest x-ray does not show any infiltrates. Patient is being admitted for acute bronchitis.   Plan 1. Acute bronchitis - change  Doxycycline to levaquin to cover for PNA . Pulmicort prednisone and albuterol nebulizer.   influenza PCR negative .CT chest to r/o pna , pulm fibrosis , ECHO to r/o chf  2. CAD status post stenting - patient gets chest pain off and on probably related to her coughing.  Troponin negative 2 3. Chronic atrial fibrillation and sick sinus syndrome status post pacemaker placement - chads 2 vasc score is more than 2. Patient is on digoxin and metoprolol. Coumadin per pharmacy. 4. Hypothyroidism on Synthroid. 5. History of systolic heart failure but last EF measured in 2015 was 60-65%. 6. History of IBS - has chronic epigastric discomfort. Check LFTs.

## 2016-03-02 NOTE — ED Notes (Signed)
Phone placed in pt's room so she may talk w/her spouse. Family at bedside.

## 2016-03-02 NOTE — Progress Notes (Signed)
ANTICOAGULATION CONSULT NOTE - Follow Up Consult  Pharmacy Consult for Warfarin  Indication: atrial fibrillation   Vital Signs: BP: 135/66 mmHg (04/04 0000) Pulse Rate: 103 (04/04 0229)  Labs:  Recent Labs  03/01/16 2255  HGB 14.0  HCT 44.5  PLT 178  CREATININE 0.73  TROPONINI <0.03    Estimated Creatinine Clearance: 55.7 mL/min (by C-G formula based on Cr of 0.73).  Assessment: 82 y/o F here with cough/shortness of breath, on chronic warfarin as an outpatient, CBC good, renal function, no INR yet  Goal of Therapy:  INR 2-3 Monitor platelets by anticoagulation protocol: Yes   Plan:  -Await INR to assess dosing needs  [name] 03/02/2016,2:51 AM

## 2016-03-02 NOTE — ED Notes (Signed)
Meds requested from pharmacy.

## 2016-03-03 ENCOUNTER — Encounter (HOSPITAL_COMMUNITY): Payer: Self-pay | Admitting: General Practice

## 2016-03-03 ENCOUNTER — Ambulatory Visit (HOSPITAL_COMMUNITY): Payer: Medicare HMO

## 2016-03-03 DIAGNOSIS — J209 Acute bronchitis, unspecified: Secondary | ICD-10-CM

## 2016-03-03 DIAGNOSIS — R0600 Dyspnea, unspecified: Secondary | ICD-10-CM

## 2016-03-03 DIAGNOSIS — I482 Chronic atrial fibrillation: Secondary | ICD-10-CM

## 2016-03-03 LAB — RESPIRATORY VIRUS PANEL
Adenovirus: NEGATIVE
INFLUENZA A: NEGATIVE
Influenza B: NEGATIVE
Metapneumovirus: NEGATIVE
PARAINFLUENZA 2 A: NEGATIVE
Parainfluenza 1: NEGATIVE
Parainfluenza 3: NEGATIVE
RESPIRATORY SYNCYTIAL VIRUS A: NEGATIVE
RESPIRATORY SYNCYTIAL VIRUS B: NEGATIVE
RHINOVIRUS: NEGATIVE

## 2016-03-03 LAB — CBC
HEMATOCRIT: 42.1 % (ref 36.0–46.0)
Hemoglobin: 13.6 g/dL (ref 12.0–15.0)
MCH: 28 pg (ref 26.0–34.0)
MCHC: 32.3 g/dL (ref 30.0–36.0)
MCV: 86.8 fL (ref 78.0–100.0)
PLATELETS: 193 10*3/uL (ref 150–400)
RBC: 4.85 MIL/uL (ref 3.87–5.11)
RDW: 15 % (ref 11.5–15.5)
WBC: 14.6 10*3/uL — ABNORMAL HIGH (ref 4.0–10.5)

## 2016-03-03 LAB — COMPREHENSIVE METABOLIC PANEL
ALK PHOS: 69 U/L (ref 38–126)
ALT: 17 U/L (ref 14–54)
AST: 25 U/L (ref 15–41)
Albumin: 3.5 g/dL (ref 3.5–5.0)
Anion gap: 11 (ref 5–15)
BILIRUBIN TOTAL: 0.9 mg/dL (ref 0.3–1.2)
BUN: 18 mg/dL (ref 6–20)
CALCIUM: 9.1 mg/dL (ref 8.9–10.3)
CO2: 26 mmol/L (ref 22–32)
CREATININE: 0.7 mg/dL (ref 0.44–1.00)
Chloride: 103 mmol/L (ref 101–111)
Glucose, Bld: 122 mg/dL — ABNORMAL HIGH (ref 65–99)
Potassium: 4.4 mmol/L (ref 3.5–5.1)
Sodium: 140 mmol/L (ref 135–145)
TOTAL PROTEIN: 6.3 g/dL — AB (ref 6.5–8.1)

## 2016-03-03 LAB — ECHOCARDIOGRAM COMPLETE: Weight: 2480 oz

## 2016-03-03 LAB — PROTIME-INR
INR: 3.12 — AB (ref 0.00–1.49)
PROTHROMBIN TIME: 31.6 s — AB (ref 11.6–15.2)

## 2016-03-03 MED ORDER — ALBUTEROL SULFATE (2.5 MG/3ML) 0.083% IN NEBU
2.5000 mg | INHALATION_SOLUTION | Freq: Two times a day (BID) | RESPIRATORY_TRACT | Status: DC
  Administered 2016-03-03 – 2016-03-04 (×2): 2.5 mg via RESPIRATORY_TRACT
  Filled 2016-03-03 (×4): qty 3

## 2016-03-03 MED ORDER — WARFARIN 0.5 MG HALF TABLET
0.5000 mg | ORAL_TABLET | Freq: Once | ORAL | Status: AC
  Administered 2016-03-03: 0.5 mg via ORAL
  Filled 2016-03-03: qty 1

## 2016-03-03 MED ORDER — GUAIFENESIN-DM 100-10 MG/5ML PO SYRP
10.0000 mL | ORAL_SOLUTION | Freq: Four times a day (QID) | ORAL | Status: DC
  Administered 2016-03-03 – 2016-03-04 (×4): 10 mL via ORAL
  Filled 2016-03-03 (×4): qty 10

## 2016-03-03 NOTE — Progress Notes (Signed)
*  PRELIMINARY RESULTS* Echocardiogram 2D Echocardiogram has been performed.  [name] 03/03/2016, 11:23 AM

## 2016-03-03 NOTE — Care Management Note (Signed)
Case Management Note  Patient Details  Name: [name] MRN: [id number] Date of Birth: 1930-02-16  Subjective/Objective:                 SPoke with patient in room. She lives at home with her husband who recently was discharged with a life vest. Admitted with acute bronchitis. Will need nebulizer at home.   Action/Plan:  [facility] to deliver nebulizer to room prior to discharge. CM will continue to follow Expected Discharge Date:                  Expected Discharge Plan:  Home/Self Care  In-House Referral:     Discharge planning Services  CM Consult  Post Acute Care Choice:    Choice offered to:  Patient, Spouse  DME Arranged:  Nebulizer machine DME Agency:  [facility]  HH Arranged:  NA HH Agency:     Status of Service:  Completed, signed off  Medicare Important Message Given:    Date Medicare IM Given:    Medicare IM give by:    Date Additional Medicare IM Given:    Additional Medicare Important Message give by:     If discussed at Long Length of Stay Meetings, dates discussed:    Additional Comments:  Lawerance Sabal, RN 03/03/2016, 4:16 PM

## 2016-03-03 NOTE — Progress Notes (Signed)
ANTICOAGULATION CONSULT NOTE - Follow Up Consult  Pharmacy Consult for Warfarin  Indication: atrial fibrillation   Vital Signs: Temp: 98.2 F (36.8 C) (04/05 0457) Temp Source: Oral (04/05 0457) BP: 145/54 mmHg (04/05 0457) Pulse Rate: 72 (04/05 0949)  Labs:  Recent Labs  03/01/16 2255 03/02/16 0312 03/02/16 0828 03/02/16 1915 03/03/16 0541  HGB 14.0 14.1  --   --  13.6  HCT 44.5 44.1  --   --  42.1  PLT 178 172  --   --  193  LABPROT  --  31.1*  --   --  31.6*  INR  --  3.06*  --   --  3.12*  CREATININE 0.73 0.79  --   --  0.70  TROPONINI <0.03 <0.03 <0.03 <0.03  --     Estimated Creatinine Clearance: 55.7 mL/min (by C-G formula based on Cr of 0.7).  Assessment: 82 y/o F here with cough/shortness of breath, on chronic warfarin as an outpatient for CAF.   Coum clinic note 01/27/16 INR goal 2-3 INR 1.8, normally takes 1/2 tablet daily = 1.25 mg, told to take 1 tablet = 2.5 mg x 1 dose then resume 1/2 tab = 1.25 daily recheck at [facility].  Per med rec PTA coumadin dose is 1.25 qhs. Last dose 02/29/16  -INR 3.12, increased slightly from 3.06 yesterday.-possibly drug interaction with antibiotic regimen.  -On levofloxacin 500 mg po daily which can increase coumadin effect, as well as received 1 dose of Azithromycin 500mg  on 4/3 and 1 dose of IV doxycycline.  -No bleeding reported. Hgb 13.6 stable and pltc wnl   Goal of Therapy:  INR 2-3 Monitor platelets by anticoagulation protocol: Yes   Plan: Give lower Coumadin dose 0.5 mg pox 1 dose today (due to levofloxacin possible DDI) Daily INR  Thank you for allowing pharmacy to be part of this patients care team. Noah Delaine, RPh Clinical Pharmacist Pager: [phone] 03/03/2016 1:32 PM

## 2016-03-03 NOTE — Progress Notes (Signed)
[facility] PROGRESS NOTE  [name]:[id number] DOB: 04-Jun-1930 DOA: 03/01/2016 PCP: No PCP Per Patient  Length of stay: 1   Assessment/Plan: Principal Problem:   Acute bronchitis Active Problems:   CAD, non DES to CFX '09, LAD BMS 7/11. Cath OK June 2014   Chronic atrial fibrillation [facility])   Hypothyroidism   SSS (sick sinus syndrome)- MDT PTVDP 9/11          82 y.o. female with history of CAD status post stenting, chronic atrial fibrillation, sick sinus syndrome status post pacemaker placement, hypothyroidism has been experiencing cough with shortness of breath over the last 1 week. In the ER patient was found to be wheezing chest x-ray does not show any infiltrates. Patient is being admitted for acute bronchitis.   Plan 1. Acute bronchitis - change Doxycycline to levaquin to cover for PNA No pneumonia seen on CT however continue Levaquin for possible COPD, Question bronchiectasis. Patient found to have mucus plugging on CT scan. Continue Pulmicort prednisone and albuterol nebulizer. influenza PCR negative .respiratory virus panel pending. Patient would benefit from outpatient pulmonary evaluation .2-D echo results pending at this time 2. CAD status post stenting - patient gets chest pain off and on probably related to her coughing. Troponin negative 2 3. Chronic atrial fibrillation and sick sinus syndrome status post pacemaker placement - chads 2 vasc score is more than 2. Patient is on digoxin and metoprolol. Coumadin per pharmacy. 4. Hypothyroidism on Synthroid. 5. History of systolic heart failure but last EF measured in 2015 was 60-65%. 6. History of IBS - has chronic epigastric discomfort. Check LFTs.  DVT prophylaxsis coumadin  Code Status:      Code Status Orders        Start     Ordered   03/02/16 0229  Full code   Continuous     03/02/16 0228    Family Communication: Discussed in detail with the patient, all imaging results, lab results  explained to the patient   Disposition Plan:  Anticipate discharge tomorrow     Consultants:  None  Procedures:  None  Antibiotics: Anti-infectives    Start     Dose/Rate Route Frequency Ordered Stop   03/02/16 1500  levofloxacin (LEVAQUIN) tablet 500 mg     500 mg Oral Daily 03/02/16 1355     03/02/16 0330  doxycycline (VIBRAMYCIN) 100 mg in dextrose 5 % 250 mL IVPB  Status:  Discontinued     100 mg 125 mL/hr over 120 Minutes Intravenous 2 times daily 03/02/16 0228 03/02/16 1355   03/01/16 1930  azithromycin (ZITHROMAX) tablet 500 mg     500 mg Oral  Once 03/01/16 1928 03/01/16 1933         HPI/Subjective: Patient extremely anxious, concerned about a lung mass. There is Family in the room including son  Objective: Filed Vitals:   03/02/16 2111 03/02/16 2147 03/03/16 0457 03/03/16 0949  BP: 138/101  145/54   Pulse: 99  59 72  Temp: 97.6 F (36.4 C)  98.2 F (36.8 C)   TempSrc: Oral  Oral   Resp: 18  20   Weight:   70.308 kg (155 lb)   SpO2: 94% 92% 96%     Intake/Output Summary (Last 24 hours) at 03/03/16 1226 Last data filed at 03/03/16 0542  Gross per 24 hour  Intake  802.5 ml  Output      0 ml  Net  802.5 ml  Exam:  General: No acute respiratory distress Lungs: Clear to auscultation bilaterally without wheezes or crackles Cardiovascular: Regular rate and rhythm without murmur gallop or rub normal S1 and S2 Abdomen: Nontender, nondistended, soft, bowel sounds positive, no rebound, no ascites, no appreciable mass Extremities: No significant cyanosis, clubbing, or edema bilateral lower extremities     Data Review   Micro Results No results found for this or any previous visit (from the past 240 hour(s)).  Radiology Reports Dg Chest 2 View  03/01/2016  CLINICAL DATA:  82 year old female with cough over the past 3 days. Subsequent encounter. EXAM: CHEST  2 VIEW COMPARISON:  02/27/2016, 08/31/2015 and 01/09/2013. FINDINGS: Pacemaker in place  with lead unchanged in position. Heart slightly enlarged. Aorta minimally tortuous. Central pulmonary vascular prominence stable without pulmonary edema. Nodule right lung base consistent with nipple shadow noted on multiple prior exams. No pulmonary mass detected on plain film exam. No segmental infiltrate. IMPRESSION: No infiltrate detected. Pacemaker in place.  Mild cardiomegaly. Electronically Signed   By: Lacy Duverney M.D.   On: 03/01/2016 14:30   Dg Chest 2 View  02/27/2016  CLINICAL DATA:  Shortness of breath, wheezing. EXAM: CHEST  2 VIEW COMPARISON:  August 31, 2015. FINDINGS: Stable cardiomegaly. No pneumothorax or pleural effusion is noted. No acute pulmonary disease is noted. Left-sided pacemaker is unchanged in position. Osteophyte formation is noted in the lower thoracic spine. IMPRESSION: No active cardiopulmonary disease. Electronically Signed   By: Lupita Raider, M.D.   On: 02/27/2016 13:22   Ct Chest Wo Contrast  03/02/2016  CLINICAL DATA:  Shortness of breath for several days. Wheezing. Initial encounter. EXAM: CT CHEST WITHOUT CONTRAST TECHNIQUE: Multidetector CT imaging of the chest was performed following the standard protocol without IV contrast. COMPARISON:  PA and lateral chest 03/01/2016 and 08/31/2015. FINDINGS: Pacing device is in place with the tip of one lead in the right atrium and a second lead in the apex of the right ventricle. There is cardiomegaly. Calcific coronary artery disease is seen. Coronary artery stents are in place. There is no axillary, hilar or mediastinal lymphadenopathy. No pleural or pericardial effusion. Minimal linear atelectasis or scar is seen in the lung bases. The lungs are otherwise clear. There is some mucous plugging in lower lobe bronchi bilaterally. Imaged upper abdomen demonstrates no focal abnormality. No worrisome bony lesion is identified. Partial visualization of upper lumbar spondylosis. IMPRESSION: The lungs are clear. Mucous plugging in  lower lobe bronchi is seen bilaterally. Cardiomegaly. Calcific aortic and coronary atherosclerosis. Electronically Signed   By: Drusilla Kanner M.D.   On: 03/02/2016 15:35     CBC  Recent Labs Lab 02/27/16 1246 03/01/16 2255 03/02/16 0312 03/03/16 0541  WBC 5.2 8.7 8.0 14.6*  HGB 14.9 14.0 14.1 13.6  HCT 45.7 44.5 44.1 42.1  PLT 183 178 172 193  MCV 87.7 87.4 86.6 86.8  MCH 28.6 27.5 27.7 28.0  MCHC 32.6 31.5 32.0 32.3  RDW 14.9 14.5 14.5 15.0  LYMPHSABS 0.9  --   --   --   MONOABS 0.8  --   --   --   EOSABS 0.1  --   --   --   BASOSABS 0.0  --   --   --     Chemistries   Recent Labs Lab 02/27/16 1246 03/01/16 2255 03/02/16 0312 03/03/16 0541  NA 137 140 139 140  K 4.3 2.8* 3.1* 4.4  CL 98* 97* 98* 103  CO2 28 30  25 26  GLUCOSE 124* 147* 232* 122*  BUN 17 22* 21* 18  CREATININE 0.64 0.73 0.79 0.70  CALCIUM 9.1 9.3 9.2 9.1  AST 21  --  27 25  ALT 15  --  17 17  ALKPHOS 87  --  79 69  BILITOT 1.3*  --  0.8 0.9   ------------------------------------------------------------------------------------------------------------------ estimated creatinine clearance is 55.7 mL/min (by C-G formula based on Cr of 0.7). ------------------------------------------------------------------------------------------------------------------ No results for input(s): HGBA1C in the last 72 hours. ------------------------------------------------------------------------------------------------------------------ No results for input(s): CHOL, HDL, LDLCALC, TRIG, CHOLHDL, LDLDIRECT in the last 72 hours. ------------------------------------------------------------------------------------------------------------------ No results for input(s): TSH, T4TOTAL, T3FREE, THYROIDAB in the last 72 hours.  Invalid input(s): FREET3 ------------------------------------------------------------------------------------------------------------------ No results for input(s): VITAMINB12, FOLATE, FERRITIN,  TIBC, IRON, RETICCTPCT in the last 72 hours.  Coagulation profile  Recent Labs Lab 03/02/16 0312 03/03/16 0541  INR 3.06* 3.12*    No results for input(s): DDIMER in the last 72 hours.  Cardiac Enzymes  Recent Labs Lab 03/02/16 0312 03/02/16 0828 03/02/16 1915  TROPONINI <0.03 <0.03 <0.03   ------------------------------------------------------------------------------------------------------------------ Invalid input(s): POCBNP   CBG: No results for input(s): GLUCAP in the last 168 hours.     Studies: Dg Chest 2 View  03/01/2016  CLINICAL DATA:  82 year old female with cough over the past 3 days. Subsequent encounter. EXAM: CHEST  2 VIEW COMPARISON:  02/27/2016, 08/31/2015 and 01/09/2013. FINDINGS: Pacemaker in place with lead unchanged in position. Heart slightly enlarged. Aorta minimally tortuous. Central pulmonary vascular prominence stable without pulmonary edema. Nodule right lung base consistent with nipple shadow noted on multiple prior exams. No pulmonary mass detected on plain film exam. No segmental infiltrate. IMPRESSION: No infiltrate detected. Pacemaker in place.  Mild cardiomegaly. Electronically Signed   By: Lacy Duverney M.D.   On: 03/01/2016 14:30   Ct Chest Wo Contrast  03/02/2016  CLINICAL DATA:  Shortness of breath for several days. Wheezing. Initial encounter. EXAM: CT CHEST WITHOUT CONTRAST TECHNIQUE: Multidetector CT imaging of the chest was performed following the standard protocol without IV contrast. COMPARISON:  PA and lateral chest 03/01/2016 and 08/31/2015. FINDINGS: Pacing device is in place with the tip of one lead in the right atrium and a second lead in the apex of the right ventricle. There is cardiomegaly. Calcific coronary artery disease is seen. Coronary artery stents are in place. There is no axillary, hilar or mediastinal lymphadenopathy. No pleural or pericardial effusion. Minimal linear atelectasis or scar is seen in the lung bases. The  lungs are otherwise clear. There is some mucous plugging in lower lobe bronchi bilaterally. Imaged upper abdomen demonstrates no focal abnormality. No worrisome bony lesion is identified. Partial visualization of upper lumbar spondylosis. IMPRESSION: The lungs are clear. Mucous plugging in lower lobe bronchi is seen bilaterally. Cardiomegaly. Calcific aortic and coronary atherosclerosis. Electronically Signed   By: Drusilla Kanner M.D.   On: 03/02/2016 15:35      Lab Results  Component Value Date   HGBA1C 6.5* 06/03/2015   HGBA1C * 06/14/2010    6.1 (NOTE)                                                                       According to the ADA Clinical Practice Recommendations for  2011, when HbA1c is used as a screening test:   >=6.5%   Diagnostic of Diabetes Mellitus           (if abnormal result  is confirmed)  5.7-6.4%   Increased risk of developing Diabetes Mellitus  References:Diagnosis and Classification of Diabetes Mellitus,Diabetes Care,2011,34(Suppl 1):S62-S69 and Standards of Medical Care in         Diabetes - 2011,Diabetes Care,2011,34  (Suppl 1):S11-S61.   HGBA1C * 10/21/2009    6.4 (NOTE) The ADA recommends the following therapeutic goal for glycemic control related to Hgb A1c measurement: Goal of therapy: <6.5 Hgb A1c  Reference: American Diabetes Association: Clinical Practice Recommendations 2010, Diabetes Care, 2010, 33: (Suppl  1).   Lab Results  Component Value Date   LDLCALC 104* 06/03/2015   CREATININE 0.70 03/03/2016       Scheduled Meds: . albuterol  2.5 mg Nebulization Q6H  . ALPRAZolam  0.25 mg Oral QHS  . budesonide (PULMICORT) nebulizer solution  0.25 mg Nebulization BID  . digoxin  125 mcg Oral Daily  . guaiFENesin-dextromethorphan  10 mL Oral Q6H  . isosorbide mononitrate  30 mg Oral Daily  . levofloxacin  500 mg Oral Daily  . levothyroxine  100 mcg Oral QAC breakfast  . metoprolol  50 mg Oral BID  . predniSONE  40 mg Oral Q breakfast  .  saccharomyces boulardii  250 mg Oral BID  . warfarin  1 mg Oral ONCE-1800  . Warfarin - Pharmacist Dosing Inpatient   Does not apply q1800   Continuous Infusions: . 0.9 % NaCl with KCl 40 mEq / L 75 mL/hr (03/03/16 0318)    Principal Problem:   Acute bronchitis Active Problems:   CAD, non DES to CFX '09, LAD BMS 7/11. Cath OK June 2014   Chronic atrial fibrillation [facility])   Hypothyroidism   SSS (sick sinus syndrome)- MDT PTVDP 9/11    Time spent: 45 minutes   [facility]  [facility] Pager [phone]. If 7PM-7AM, please contact night-coverage at [email or web address], password [facility] 03/03/2016, 12:26 PM  LOS: 1 day

## 2016-03-04 LAB — COMPREHENSIVE METABOLIC PANEL
ALT: 21 U/L (ref 14–54)
AST: 29 U/L (ref 15–41)
Albumin: 3.5 g/dL (ref 3.5–5.0)
Alkaline Phosphatase: 70 U/L (ref 38–126)
Anion gap: 11 (ref 5–15)
BILIRUBIN TOTAL: 0.8 mg/dL (ref 0.3–1.2)
BUN: 15 mg/dL (ref 6–20)
CALCIUM: 9.3 mg/dL (ref 8.9–10.3)
CHLORIDE: 101 mmol/L (ref 101–111)
CO2: 30 mmol/L (ref 22–32)
CREATININE: 0.63 mg/dL (ref 0.44–1.00)
Glucose, Bld: 99 mg/dL (ref 65–99)
Potassium: 3.6 mmol/L (ref 3.5–5.1)
SODIUM: 142 mmol/L (ref 135–145)
TOTAL PROTEIN: 6.3 g/dL — AB (ref 6.5–8.1)

## 2016-03-04 LAB — PROTIME-INR
INR: 2.27 — ABNORMAL HIGH (ref 0.00–1.49)
PROTHROMBIN TIME: 24.8 s — AB (ref 11.6–15.2)

## 2016-03-04 LAB — CBC
HCT: 44.6 % (ref 36.0–46.0)
Hemoglobin: 14.3 g/dL (ref 12.0–15.0)
MCH: 28.1 pg (ref 26.0–34.0)
MCHC: 32.1 g/dL (ref 30.0–36.0)
MCV: 87.6 fL (ref 78.0–100.0)
PLATELETS: 225 10*3/uL (ref 150–400)
RBC: 5.09 MIL/uL (ref 3.87–5.11)
RDW: 15.2 % (ref 11.5–15.5)
WBC: 15 10*3/uL — ABNORMAL HIGH (ref 4.0–10.5)

## 2016-03-04 MED ORDER — ALBUTEROL SULFATE HFA 108 (90 BASE) MCG/ACT IN AERS: 1.0000 | INHALATION_SPRAY | Freq: Four times a day (QID) | RESPIRATORY_TRACT | Status: DC | PRN

## 2016-03-04 MED ORDER — GUAIFENESIN-DM 100-10 MG/5ML PO SYRP: 10.0000 mL | ORAL_SOLUTION | Freq: Four times a day (QID) | ORAL | Status: DC

## 2016-03-04 MED ORDER — BUDESONIDE 0.25 MG/2ML IN SUSP
0.2500 mg | Freq: Two times a day (BID) | RESPIRATORY_TRACT | Status: DC
  Administered 2016-03-04: 0.25 mg via RESPIRATORY_TRACT
  Filled 2016-03-04: qty 2

## 2016-03-04 MED ORDER — ALPRAZOLAM 0.25 MG PO TABS: 0.2500 mg | ORAL_TABLET | Freq: Two times a day (BID) | ORAL | Status: DC

## 2016-03-04 MED ORDER — SACCHAROMYCES BOULARDII 250 MG PO CAPS: 250.0000 mg | ORAL_CAPSULE | Freq: Two times a day (BID) | ORAL | Status: DC

## 2016-03-04 MED ORDER — PREDNISONE 20 MG PO TABS: 40.0000 mg | ORAL_TABLET | Freq: Every day | ORAL | Status: DC

## 2016-03-04 MED ORDER — LEVOFLOXACIN 500 MG PO TABS: 500.0000 mg | ORAL_TABLET | Freq: Every day | ORAL | Status: DC

## 2016-03-04 NOTE — Discharge Summary (Addendum)
Physician Discharge Summary  [name] MRN: [id number] DOB/AGE: 04/09/1930 82 y.o.  PCP: No PCP Per Patient   Admit date: 03/01/2016 Discharge date: 03/04/2016  Discharge Diagnoses:   Principal Problem:   Acute bronchitis Active Problems:   CAD, non DES to CFX '09, LAD BMS 7/11. Cath OK June 2014   Chronic atrial fibrillation [facility])   Hypothyroidism   SSS (sick sinus syndrome)- MDT PTVDP 9/11    Follow-up recommendations Follow-up with PCP in 3-5 days , including all  additional recommended appointments as below Follow-up CBC, CMP in 3-5 days Patient needs outpatient pulmonary evaluation for chronic bronchitis INR monitoring per PCP, next INR check should be around 4/10      Medication List    STOP taking these medications        cephALEXin 500 MG capsule  Commonly known as:  KEFLEX      TAKE these medications        albuterol 108 (90 Base) MCG/ACT inhaler  Commonly known as:  PROVENTIL HFA;VENTOLIN HFA  Inhale 1-2 puffs into the lungs every 6 (six) hours as needed for wheezing or shortness of breath.     ALPRAZolam 0.25 MG tablet  Commonly known as:  XANAX  Take 1 tablet (0.25 mg total) by mouth 2 (two) times daily.     benzonatate 100 MG capsule  Commonly known as:  TESSALON  Take 1 capsule (100 mg total) by mouth 3 (three) times daily as needed for cough.     dicyclomine 10 MG capsule  Commonly known as:  BENTYL  1 capsule by mouth twice daily as needed for abdominal pain or cramps.     DIGOX 0.125 MG tablet  Generic drug:  digoxin  TAKE 1 TABLET BY MOUTH DAILY     guaiFENesin-dextromethorphan 100-10 MG/5ML syrup  Commonly known as:  ROBITUSSIN DM  Take 10 mLs by mouth every 6 (six) hours.     isosorbide mononitrate 30 MG 24 hr tablet  Commonly known as:  IMDUR  TAKE 1 TABLET BY MOUTH DAILY     levofloxacin 500 MG tablet  Commonly known as:  LEVAQUIN  Take 1 tablet (500 mg total) by mouth daily.     levothyroxine 100 MCG tablet  Commonly  known as:  SYNTHROID, LEVOTHROID  TAKE 1 TABLET BY MOUTH EVERY DAY     metoprolol 50 MG tablet  Commonly known as:  LOPRESSOR  TAKE 1 TABLET(50 MG) BY MOUTH TWICE DAILY     predniSONE 20 MG tablet  Commonly known as:  DELTASONE  Take 2 tablets (40 mg total) by mouth daily with breakfast.     saccharomyces boulardii 250 MG capsule  Commonly known as:  FLORASTOR  Take 1 capsule (250 mg total) by mouth 2 (two) times daily.     traMADol 50 MG tablet  Commonly known as:  ULTRAM  Take 25-50 mg by mouth every 6 (six) hours as needed for pain.     warfarin 2.5 MG tablet  Commonly known as:  COUMADIN  TAKE 1 TABLET BY MOUTH DAILY AS DIRECTED BY COUMADIN CLINIC     YUVAFEM 10 MCG Tabs vaginal tablet  Generic drug:  Estradiol  UNWRAP AND INSERT 1 TABLET IN VAGINA 2 TIMES A WEEK         Discharge Condition:Stable    Discharge Instructions Get Medicines reviewed and adjusted: Please take all your medications with you for your next visit with your Primary MD  Please request your Primary MD to  go over all hospital tests and procedure/radiological results at the follow up, please ask your Primary MD to get all Hospital records sent to his/her office.  If you experience worsening of your admission symptoms, develop shortness of breath, life threatening emergency, suicidal or homicidal thoughts you must seek medical attention immediately by calling 911 or calling your MD immediately if symptoms less severe.  You must read complete instructions/literature along with all the possible adverse reactions/side effects for all the Medicines you take and that have been prescribed to you. Take any new Medicines after you have completely understood and accpet all the possible adverse reactions/side effects.   Do not drive when taking Pain medications.   Do not take more than prescribed Pain, Sleep and Anxiety Medications  Special Instructions: If you have smoked or chewed Tobacco in the last 2  yrs please stop smoking, stop any regular Alcohol and or any Recreational drug use.  Wear Seat belts while driving.  Please note  You were cared for by a hospitalist during your hospital stay. Once you are discharged, your primary care physician will handle any further medical issues. Please note that NO REFILLS for any discharge medications will be authorized once you are discharged, as it is imperative that you return to your primary care physician (or establish a relationship with a primary care physician if you do not have one) for your aftercare needs so that they can reassess your need for medications and monitor your lab values.      Discharge Instructions    Diet - low sodium heart healthy    Complete by:  As directed      Increase activity slowly    Complete by:  As directed            Allergies  Allergen Reactions  . Iohexol Other (See Comments)     Desc: CHEST PAIN, LOC AFTER HEART CATH, PT REFUSED DYE   . Penicillins Other (See Comments)    Told not to take medication  . Sulfa Antibiotics Other (See Comments)    Told not to take med      Disposition: 01-Home or Self Care   Consults:  None     Significant Diagnostic Studies:  Dg Chest 2 View  03/01/2016  CLINICAL DATA:  82 year old female with cough over the past 3 days. Subsequent encounter. EXAM: CHEST  2 VIEW COMPARISON:  02/27/2016, 08/31/2015 and 01/09/2013. FINDINGS: Pacemaker in place with lead unchanged in position. Heart slightly enlarged. Aorta minimally tortuous. Central pulmonary vascular prominence stable without pulmonary edema. Nodule right lung base consistent with nipple shadow noted on multiple prior exams. No pulmonary mass detected on plain film exam. No segmental infiltrate. IMPRESSION: No infiltrate detected. Pacemaker in place.  Mild cardiomegaly. Electronically Signed   By: Genia Del M.D.   On: 03/01/2016 14:30   Dg Chest 2 View  02/27/2016  CLINICAL DATA:  Shortness of breath,  wheezing. EXAM: CHEST  2 VIEW COMPARISON:  August 31, 2015. FINDINGS: Stable cardiomegaly. No pneumothorax or pleural effusion is noted. No acute pulmonary disease is noted. Left-sided pacemaker is unchanged in position. Osteophyte formation is noted in the lower thoracic spine. IMPRESSION: No active cardiopulmonary disease. Electronically Signed   By: Marijo Conception, M.D.   On: 02/27/2016 13:22   Ct Chest Wo Contrast  03/02/2016  CLINICAL DATA:  Shortness of breath for several days. Wheezing. Initial encounter. EXAM: CT CHEST WITHOUT CONTRAST TECHNIQUE: Multidetector CT imaging of the chest was performed following  the standard protocol without IV contrast. COMPARISON:  PA and lateral chest 03/01/2016 and 08/31/2015. FINDINGS: Pacing device is in place with the tip of one lead in the right atrium and a second lead in the apex of the right ventricle. There is cardiomegaly. Calcific coronary artery disease is seen. Coronary artery stents are in place. There is no axillary, hilar or mediastinal lymphadenopathy. No pleural or pericardial effusion. Minimal linear atelectasis or scar is seen in the lung bases. The lungs are otherwise clear. There is some mucous plugging in lower lobe bronchi bilaterally. Imaged upper abdomen demonstrates no focal abnormality. No worrisome bony lesion is identified. Partial visualization of upper lumbar spondylosis. IMPRESSION: The lungs are clear. Mucous plugging in lower lobe bronchi is seen bilaterally. Cardiomegaly. Calcific aortic and coronary atherosclerosis. Electronically Signed   By: Inge Rise M.D.   On: 03/02/2016 15:35    2-D echo LV EF: 55% - 60%  ------------------------------------------------------------------- Indications: Dyspnea 786.09.  ------------------------------------------------------------------- History: PMH: Atrial fibrillation. Coronary artery disease. Angina pectoris. Coronary artery disease. Risk  factors: Hypertension.  ------------------------------------------------------------------- Study Conclusions  - Left ventricle: The cavity size was normal. Systolic function was  normal. The estimated ejection fraction was in the range of 55%  to 60%. Wall motion was normal; there were no regional wall  motion abnormalities. There was an increased relative  contribution of atrial contraction to ventricular filling. The  study was not technically sufficient to allow evaluation of LV  diastolic dysfunction due to atrial fibrillation. - Aortic valve: Trileaflet; mildly thickened, mildly calcified  leaflets. - Mitral valve: There was mild regurgitation. - Right atrium: The atrium was moderately dilated   Filed Weights   03/03/16 0457  Weight: 70.308 kg (155 lb)     Microbiology: Recent Results (from the past 240 hour(s))  Respiratory virus panel     Status: None   Collection Time: 03/02/16  3:04 PM  Result Value Ref Range Status   Source - RVPAN NASAL SWAB  Corrected   Respiratory Syncytial Virus A Negative Negative Final   Respiratory Syncytial Virus B Negative Negative Final   Influenza A Negative Negative Final   Influenza B Negative Negative Final   Parainfluenza 1 Negative Negative Final   Parainfluenza 2 Negative Negative Final   Parainfluenza 3 Negative Negative Final   Metapneumovirus Negative Negative Final   Rhinovirus Negative Negative Final   Adenovirus Negative Negative Final    Comment: (NOTE) Performed At: [facility] [place], [place] [id number] Lindon Romp MD WU:[id number]        Blood Culture No results found for: [place], [place], CULT, REPTSTATUS    Labs: Results for orders placed or performed during the hospital encounter of 03/01/16 (from the past 48 hour(s))  Respiratory virus panel     Status: None   Collection Time: 03/02/16  3:04 PM  Result Value Ref Range   Source - RVPAN NASAL SWAB     Respiratory Syncytial Virus A Negative Negative   Respiratory Syncytial Virus B Negative Negative   Influenza A Negative Negative   Influenza B Negative Negative   Parainfluenza 1 Negative Negative   Parainfluenza 2 Negative Negative   Parainfluenza 3 Negative Negative   Metapneumovirus Negative Negative   Rhinovirus Negative Negative   Adenovirus Negative Negative    Comment: (NOTE) Performed At: [facility] [place], [place] [id number] Lindon Romp MD VH:[id number]   Troponin I (q 6hr x 3)     Status: None  Collection Time: 03/02/16  7:15 PM  Result Value Ref Range   Troponin I <0.03 <0.031 ng/mL    Comment:        NO INDICATION OF MYOCARDIAL INJURY.   CBC     Status: Abnormal   Collection Time: 03/03/16  5:41 AM  Result Value Ref Range   WBC 14.6 (H) 4.0 - 10.5 K/uL   RBC 4.85 3.87 - 5.11 MIL/uL   Hemoglobin 13.6 12.0 - 15.0 g/dL   HCT 42.1 36.0 - 46.0 %   MCV 86.8 78.0 - 100.0 fL   MCH 28.0 26.0 - 34.0 pg   MCHC 32.3 30.0 - 36.0 g/dL   RDW 15.0 11.5 - 15.5 %   Platelets 193 150 - 400 K/uL  Comprehensive metabolic panel     Status: Abnormal   Collection Time: 03/03/16  5:41 AM  Result Value Ref Range   Sodium 140 135 - 145 mmol/L   Potassium 4.4 3.5 - 5.1 mmol/L   Chloride 103 101 - 111 mmol/L   CO2 26 22 - 32 mmol/L   Glucose, Bld 122 (H) 65 - 99 mg/dL   BUN 18 6 - 20 mg/dL   Creatinine, Ser 0.70 0.44 - 1.00 mg/dL   Calcium 9.1 8.9 - 10.3 mg/dL   Total Protein 6.3 (L) 6.5 - 8.1 g/dL   Albumin 3.5 3.5 - 5.0 g/dL   AST 25 15 - 41 U/L   ALT 17 14 - 54 U/L   Alkaline Phosphatase 69 38 - 126 U/L   Total Bilirubin 0.9 0.3 - 1.2 mg/dL   GFR calc non Af Amer >60 >60 mL/min   GFR calc Af Amer >60 >60 mL/min    Comment: (NOTE) The eGFR has been calculated using the CKD EPI equation. This calculation has not been validated in all clinical situations. eGFR's persistently <60 mL/min signify possible Chronic Kidney Disease.    Anion gap 11  5 - 15  Protime-INR     Status: Abnormal   Collection Time: 03/03/16  5:41 AM  Result Value Ref Range   Prothrombin Time 31.6 (H) 11.6 - 15.2 seconds   INR 3.12 (H) 0.00 - 1.49  Protime-INR     Status: Abnormal   Collection Time: 03/04/16  5:55 AM  Result Value Ref Range   Prothrombin Time 24.8 (H) 11.6 - 15.2 seconds   INR 2.27 (H) 0.00 - 1.49  CBC     Status: Abnormal   Collection Time: 03/04/16  5:55 AM  Result Value Ref Range   WBC 15.0 (H) 4.0 - 10.5 K/uL   RBC 5.09 3.87 - 5.11 MIL/uL   Hemoglobin 14.3 12.0 - 15.0 g/dL   HCT 44.6 36.0 - 46.0 %   MCV 87.6 78.0 - 100.0 fL   MCH 28.1 26.0 - 34.0 pg   MCHC 32.1 30.0 - 36.0 g/dL   RDW 15.2 11.5 - 15.5 %   Platelets 225 150 - 400 K/uL  Comprehensive metabolic panel     Status: Abnormal   Collection Time: 03/04/16  5:55 AM  Result Value Ref Range   Sodium 142 135 - 145 mmol/L   Potassium 3.6 3.5 - 5.1 mmol/L   Chloride 101 101 - 111 mmol/L   CO2 30 22 - 32 mmol/L   Glucose, Bld 99 65 - 99 mg/dL   BUN 15 6 - 20 mg/dL   Creatinine, Ser 0.63 0.44 - 1.00 mg/dL   Calcium 9.3 8.9 - 10.3 mg/dL   Total Protein 6.3 (  L) 6.5 - 8.1 g/dL   Albumin 3.5 3.5 - 5.0 g/dL   AST 29 15 - 41 U/L   ALT 21 14 - 54 U/L   Alkaline Phosphatase 70 38 - 126 U/L   Total Bilirubin 0.8 0.3 - 1.2 mg/dL   GFR calc non Af Amer >60 >60 mL/min   GFR calc Af Amer >60 >60 mL/min    Comment: (NOTE) The eGFR has been calculated using the CKD EPI equation. This calculation has not been validated in all clinical situations. eGFR's persistently <60 mL/min signify possible Chronic Kidney Disease.    Anion gap 11 5 - 15     Lipid Panel     Component Value Date/Time   CHOL 173 06/03/2015 0216   TRIG 94 06/03/2015 0216   HDL 50 06/03/2015 0216   CHOLHDL 3.5 06/03/2015 0216   VLDL 19 06/03/2015 0216   LDLCALC 104* 06/03/2015 0216     Lab Results  Component Value Date   HGBA1C 6.5* 06/03/2015   HGBA1C * 06/14/2010    6.1 (NOTE)                                                                        According to the ADA Clinical Practice Recommendations for 2011, when HbA1c is used as a screening test:   >=6.5%   Diagnostic of Diabetes Mellitus           (if abnormal result  is confirmed)  5.7-6.4%   Increased risk of developing Diabetes Mellitus  References:Diagnosis and Classification of Diabetes Mellitus,Diabetes VEHM,0947,09(GGEZM 1):S62-S69 and Standards of Medical Care in         Diabetes - 2011,Diabetes Care,2011,34  (Suppl 1):S11-S61.   HGBA1C * 10/21/2009    6.4 (NOTE) The ADA recommends the following therapeutic goal for glycemic control related to Hgb A1c measurement: Goal of therapy: <6.5 Hgb A1c  Reference: American Diabetes Association: Clinical Practice Recommendations 2010, Diabetes Care, 2010, 33: (Suppl  1).        HPI :[name] is a 82 y.o. female with history of CAD status post stenting, chronic atrial fibrillation, sick sinus syndrome status post pacemaker placement, hypothyroidism has been experiencing cough with shortness of breath over the last 1 week. Patient had originally gone to her primary care physician and was prescribed Keflex last week. Patient had missed some doses of Keflex. Patient did come to the ER 3 days ago and at that time was advised to continue with the medication. Despite which patient was still having shortness of breath. Patient was not able to use the inhaler well. In the ER patient was found to be wheezing chest x-ray does not show any infiltrates. Patient is being admitted for acute bronchitis  HOSPITAL COURSE:  Plan 1. Acute bronchitis - started on levaquin to cover for possible PNA . No pneumonia seen on CT however continue Levaquin for possible COPD, Question bronchiectasis, therefore treat with 7 more days of Levaquin. Patient found to have mucus plugging on CT scan. Continue Pulmicort prednisone and albuterol nebulizer. influenza PCR negative .respiratory virus panel negative. Given  chronic cough, chronic shortness of breath, Patient would benefit from outpatient pulmonary evaluation .2-D echo results as above 2. CAD status post stenting -  patient gets chest pain off and on probably related to her coughing. Troponin negative 2 3. Chronic atrial fibrillation and sick sinus syndrome status post pacemaker placement - chads 2 vasc score is more than 2. Patient is on digoxin and metoprolol. Coumadin per pharmacy. INR 2.27 on the day of discharge 4. Hypothyroidism on Synthroid. 5. History of systolic heart failure but last EF measured in 2015 was 60-65%. 6. History of IBS - has chronic epigastric discomfort. LFTs normal   Discharge Exam: * Blood pressure 153/91, pulse 98, temperature 97.4 F (36.3 C), temperature source Oral, resp. rate 22, weight 70.308 kg (155 lb), SpO2 94 %.  General: No acute respiratory distress Lungs: Clear to auscultation bilaterally without wheezes or crackles Cardiovascular: Regular rate and rhythm without murmur gallop or rub normal S1 and S2 Abdomen: Nontender, nondistended, soft, bowel sounds positive, no rebound, no ascites, no appreciable mass Extremities: No significant cyanosis, clubbing, or edema bilateral lower extremities    Follow-up Information    Follow up with [facility].   Why:  nebulizer to be delivered to room prior to discharge   Contact information:   [address] [place] [address] [phone]       Follow up with pcp. Schedule an appointment as soon as possible for a visit in 3 days.      Follow up with Rush Landmark, MD. Schedule an appointment as soon as possible for a visit on 03/08/2016.   Specialty:  Pulmonary Disease   Why:  hospital follow up// Appointment with Dr. Tennis Must dios is on 03/08/16 at 3:30pm   Contact information:   [address] [place]  [address] [phone]       Signed: Reyne Dumas 03/04/2016, 10:29 AM        Time spent >45 mins

## 2016-03-04 NOTE — Progress Notes (Signed)
SATURATION QUALIFICATIONS: (This note is used to comply with regulatory documentation for home oxygen)  Patient Saturations on Room Air at Rest = 97%  Patient Saturations on Room Air while Ambulating = 96%  Patient Saturations on N/A Liters of oxygen while Ambulating = N/A%  Please briefly explain why patient needs home oxygen: Pt does not need home oxygen  [facility] PT [phone]

## 2016-03-04 NOTE — Progress Notes (Signed)
[name] to be D/C'd Home per MD order.  Discussed with the patient and all questions fully answered.  VSS, Skin clean, dry and intact without evidence of skin break down, no evidence of skin tears noted. IV catheter discontinued intact. Site without signs and symptoms of complications. Dressing and pressure applied.  An After Visit Summary was printed and given to the patient. Patient received prescription.  D/c education completed with patient/family including follow up instructions, medication list, d/c activities limitations if indicated, with other d/c instructions as indicated by MD - patient able to verbalize understanding, all questions fully answered.   Patient instructed to return to ED, call 911, or call MD for any changes in condition.   Patient escorted via WC, and D/C home via private auto.  Pura Spice 03/04/2016 1:03 PM

## 2016-03-04 NOTE — Evaluation (Signed)
Physical Therapy Evaluation Patient Details Name: [name] MRN: [id number] DOB: 11-01-1930 Today's Date: 03/04/2016   History of Present Illness  Pt adm with acute bronchitis. PMH - pacer, afib, CAD  Clinical Impression  Pt doing well with mobility and no further PT needed.  Ready for dc from PT standpoint.      Follow Up Recommendations No PT follow up    Equipment Recommendations  None recommended by PT    Recommendations for Other Services       Precautions / Restrictions Precautions Precautions: None Restrictions Weight Bearing Restrictions: No      Mobility  Bed Mobility Overal bed mobility: Modified Independent                Transfers Overall transfer level: Modified independent                  Ambulation/Gait Ambulation/Gait assistance: Modified independent (Device/Increase time) Ambulation Distance (Feet): 225 Feet Assistive device: None Gait Pattern/deviations: Step-through pattern;Decreased stride length     General Gait Details: Steady gait. SpO2 96% with amb on RA  Stairs            Wheelchair Mobility    Modified Rankin (Stroke Patients Only)       Balance Overall balance assessment: Modified Independent                                           Pertinent Vitals/Pain Pain Assessment: No/denies pain    Home Living Family/patient expects to be discharged to:: Private residence Living Arrangements: Spouse/significant other Available Help at Discharge: Family;Available PRN/intermittently (spouse is also in hospital. Children available some)           Home Equipment: None      Prior Function Level of Independence: Independent               Hand Dominance        Extremity/Trunk Assessment   Upper Extremity Assessment: Overall WFL for tasks assessed           Lower Extremity Assessment: Overall WFL for tasks assessed         Communication   Communication: No  difficulties  Cognition Arousal/Alertness: Awake/alert Behavior During Therapy: WFL for tasks assessed/performed Overall Cognitive Status: Within Functional Limits for tasks assessed                      General Comments      Exercises        Assessment/Plan    PT Assessment Patent does not need any further PT services  PT Diagnosis Generalized weakness   PT Problem List    PT Treatment Interventions     PT Goals (Current goals can be found in the Care Plan section) Acute Rehab PT Goals Patient Stated Goal: Return home PT Goal Formulation: All assessment and education complete, DC therapy    Frequency     Barriers to discharge        Co-evaluation               End of Session   Activity Tolerance: Patient tolerated treatment well Patient left: in bed;with call bell/phone within reach;with bed alarm set Nurse Communication: Mobility status         Time: 4098-1191 PT Time Calculation (min) (ACUTE ONLY): 15 min   Charges:   PT Evaluation $PT  Eval Low Complexity: 1 Procedure     PT G Codes:        Athalee Esterline 03-25-16, 9:55 AM [facility] PT [phone]

## 2016-03-04 NOTE — Care Management Note (Signed)
Case Management Note  Patient Details  Name: [name] MRN: [id number] Date of Birth: 1930-06-03  Subjective/Objective:                 Admitted with acute bronchitis.   Action/Plan: Plan is to d/c to home today. CM with no further needs identified.  Expected Discharge Date:     03/04/16             Expected Discharge Plan:  Home/Self Care  In-House Referral:     Discharge planning Services  CM Consult  Post Acute Care Choice:    Choice offered to:  Patient, Spouse  DME Arranged:  Nebulizer machine DME Agency:  [facility]  HH Arranged:  NA HH Agency:     Status of Service:  Completed, signed off   Epifanio Lesches, [place] [phone] 03/04/2016, 10:53 AM

## 2016-03-05 MED ORDER — MOMETASONE FURO-FORMOTEROL FUM 100-5 MCG/ACT IN AERO: 2.0000 | INHALATION_SPRAY | Freq: Two times a day (BID) | RESPIRATORY_TRACT | Status: DC

## 2016-03-06 ENCOUNTER — Telehealth: Payer: Self-pay | Admitting: *Deleted

## 2016-03-06 ENCOUNTER — Other Ambulatory Visit: Payer: Self-pay | Admitting: Cardiovascular Disease

## 2016-03-08 ENCOUNTER — Ambulatory Visit (INDEPENDENT_AMBULATORY_CARE_PROVIDER_SITE_OTHER): Payer: Medicare HMO | Admitting: Pulmonary Disease

## 2016-03-08 ENCOUNTER — Other Ambulatory Visit: Payer: Self-pay | Admitting: *Deleted

## 2016-03-08 ENCOUNTER — Encounter: Payer: Self-pay | Admitting: Pulmonary Disease

## 2016-03-08 VITALS — BP 118/62 | HR 81 | Ht 69.0 in | Wt 148.0 lb

## 2016-03-08 DIAGNOSIS — J209 Acute bronchitis, unspecified: Secondary | ICD-10-CM | POA: Diagnosis not present

## 2016-03-08 DIAGNOSIS — R0902 Hypoxemia: Secondary | ICD-10-CM | POA: Diagnosis not present

## 2016-03-08 DIAGNOSIS — I482 Chronic atrial fibrillation, unspecified: Secondary | ICD-10-CM

## 2016-03-08 DIAGNOSIS — J439 Emphysema, unspecified: Secondary | ICD-10-CM | POA: Diagnosis not present

## 2016-03-08 DIAGNOSIS — J449 Chronic obstructive pulmonary disease, unspecified: Secondary | ICD-10-CM | POA: Diagnosis not present

## 2016-03-08 MED ORDER — METOPROLOL TARTRATE 50 MG PO TABS: 50.0000 mg | ORAL_TABLET | Freq: Two times a day (BID) | ORAL | Status: DC

## 2016-03-08 MED ORDER — BUDESONIDE-FORMOTEROL FUMARATE 160-4.5 MCG/ACT IN AERO: 2.0000 | INHALATION_SPRAY | Freq: Two times a day (BID) | RESPIRATORY_TRACT | Status: DC
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
## 2016-03-08 NOTE — Assessment & Plan Note (Signed)
2-D echo (April 2017) EF 55%. Cont with  Coumadin. Follow-up with Dr. Kelly, cardiologist. 

## 2016-03-08 NOTE — Patient Instructions (Addendum)
1. Start symbicort 160/4.5 2 puffs 2x/d. Rinse  mouth each time you use it. 2. Use albuterol, 2 puffs every 4 hours as needed for shortness of breath. 3. We will start you on oxygen 2L with exertion. 4. We will schedule you for a breathing test and blood test.   Return to clinic in 6 weeks.

## 2016-03-08 NOTE — Assessment & Plan Note (Signed)
Patient had bronchitis in March 2017. Not better with K Flex. Admitted April 4 until the seventh. Chest CT scan with mucous plugs. Discharged on levofloxacin. Still coughing. Still short of breath. Plan : 1. Finish levaquin. 2. Finish Pred taper.

## 2016-03-08 NOTE — Assessment & Plan Note (Signed)
Start oxygen 2 L with exertion. O2 sats on walking was 81% on room air. Needed 2 L to keep O2 saturation more than 88.

## 2016-03-08 NOTE — Telephone Encounter (Signed)
REFILL 
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
## 2016-03-10 ENCOUNTER — Other Ambulatory Visit: Payer: Self-pay | Admitting: Cardiovascular Disease

## 2016-03-10 NOTE — Telephone Encounter (Signed)
Rx(s) sent to pharmacy electronically.  

## 2016-03-15 ENCOUNTER — Ambulatory Visit (INDEPENDENT_AMBULATORY_CARE_PROVIDER_SITE_OTHER): Payer: Medicare HMO | Admitting: Gynecology

## 2016-03-15 ENCOUNTER — Encounter: Payer: Self-pay | Admitting: Gynecology

## 2016-03-15 VITALS — BP 134/84

## 2016-03-15 DIAGNOSIS — Z4689 Encounter for fitting and adjustment of other specified devices: Secondary | ICD-10-CM | POA: Diagnosis not present

## 2016-03-15 DIAGNOSIS — N993 Prolapse of vaginal vault after hysterectomy: Secondary | ICD-10-CM

## 2016-03-15 MED ORDER — NONFORMULARY OR COMPOUNDED ITEM: Status: AC
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
## 2016-03-15 NOTE — Patient Instructions (Signed)
You may clean Pesssary once a month. The estrogen cream from the compound pharmacy you may use twice a week

## 2016-03-16 ENCOUNTER — Telehealth: Payer: Self-pay | Admitting: Pulmonary Disease

## 2016-03-16 NOTE — Telephone Encounter (Signed)
Called [facility], unable to get through phone line, phone rang through prompts and would never connect to the extension dialed. Will call back in the morning.  Patient Instructions 03/08/16    1. Start symbicort 160/4.5 2 puffs 2x/d. Rinse mouth each time you use it. 2. Use albuterol, 2 puffs every 4 hours as needed for shortness of breath. 3. We will start you on oxygen 2L with exertion. 4. We will schedule you for a breathing test and blood test.   Return to clinic in 6 weeks.

## 2016-03-17 NOTE — Telephone Encounter (Signed)
Spoke with Dr Leandrew Koyanagi and notified that the pt should be on the Symbicort 160/4.5 2 puffs bid  He verbalized understanding  I have taken the [facility] off of her MAR  Nothing further needed

## 2016-03-31 ENCOUNTER — Encounter (INDEPENDENT_AMBULATORY_CARE_PROVIDER_SITE_OTHER): Payer: Self-pay | Admitting: *Deleted

## 2016-04-06 ENCOUNTER — Ambulatory Visit (INDEPENDENT_AMBULATORY_CARE_PROVIDER_SITE_OTHER): Payer: Medicare HMO | Admitting: Internal Medicine

## 2016-04-15 ENCOUNTER — Telehealth: Payer: Self-pay | Admitting: Pulmonary Disease

## 2016-04-15 DIAGNOSIS — R0902 Hypoxemia: Secondary | ICD-10-CM

## 2016-04-15 NOTE — Telephone Encounter (Signed)
Ok for POC.  AD

## 2016-04-15 NOTE — Telephone Encounter (Signed)
Order sent to [facility] 

## 2016-04-15 NOTE — Telephone Encounter (Signed)
Spoke with [facility] and pt is requesting to try a portable oxygen concentrator.  Please advise if ok to send order for POC eval.

## 2016-04-19 ENCOUNTER — Ambulatory Visit (HOSPITAL_COMMUNITY)
Admission: RE | Admit: 2016-04-19 | Discharge: 2016-04-19 | Disposition: A | Discharge status: Home / Self Care | Payer: Medicare HMO | Source: Ambulatory Visit | Attending: Pulmonary Disease | Admitting: Pulmonary Disease

## 2016-04-19 ENCOUNTER — Ambulatory Visit (INDEPENDENT_AMBULATORY_CARE_PROVIDER_SITE_OTHER): Payer: Medicare HMO | Admitting: Adult Health

## 2016-04-19 ENCOUNTER — Encounter: Payer: Self-pay | Admitting: Adult Health

## 2016-04-19 VITALS — BP 140/64 | HR 68 | Temp 97.6°F | Ht 69.0 in | Wt 158.0 lb

## 2016-04-19 DIAGNOSIS — J4540 Moderate persistent asthma, uncomplicated: Secondary | ICD-10-CM

## 2016-04-19 DIAGNOSIS — J9610 Chronic respiratory failure, unspecified whether with hypoxia or hypercapnia: Secondary | ICD-10-CM | POA: Insufficient documentation

## 2016-04-19 DIAGNOSIS — J449 Chronic obstructive pulmonary disease, unspecified: Secondary | ICD-10-CM

## 2016-04-19 DIAGNOSIS — J9611 Chronic respiratory failure with hypoxia: Secondary | ICD-10-CM

## 2016-04-19 DIAGNOSIS — J45909 Unspecified asthma, uncomplicated: Secondary | ICD-10-CM | POA: Insufficient documentation

## 2016-04-19 LAB — PULMONARY FUNCTION TEST
DL/VA % PRED: 59 %
DL/VA: 3.16 ml/min/mmHg/L
DLCO unc % pred: 33 %
DLCO unc: 10.42 ml/min/mmHg
FEF 25-75 POST: 1.2 L/s
FEF 25-75 Pre: 0.88 L/sec
FEF2575-%CHANGE-POST: 37 %
FEF2575-%PRED-PRE: 57 %
FEF2575-%Pred-Post: 78 %
FEV1-%Change-Post: 8 %
FEV1-%PRED-PRE: 65 %
FEV1-%Pred-Post: 70 %
FEV1-PRE: 1.5 L
FEV1-Post: 1.63 L
FEV1FVC-%CHANGE-POST: 4 %
FEV1FVC-%Pred-Pre: 88 %
FEV6-%CHANGE-POST: 3 %
FEV6-%PRED-PRE: 79 %
FEV6-%Pred-Post: 81 %
FEV6-PRE: 2.31 L
FEV6-Post: 2.39 L
FEV6FVC-%Pred-Post: 105 %
FEV6FVC-%Pred-Pre: 105 %
FVC-%CHANGE-POST: 3 %
FVC-%PRED-POST: 77 %
FVC-%PRED-PRE: 74 %
FVC-POST: 2.39 L
FVC-PRE: 2.31 L
POST FEV1/FVC RATIO: 68 %
PRE FEV1/FVC RATIO: 65 %
Post FEV6/FVC ratio: 100 %
Pre FEV6/FVC Ratio: 100 %
RV % pred: 144 %
RV: 3.91 L
TLC % pred: 109 %
TLC: 6.33 L

## 2016-04-19 MED ORDER — ALBUTEROL SULFATE (2.5 MG/3ML) 0.083% IN NEBU
2.5000 mg | INHALATION_SOLUTION | Freq: Once | RESPIRATORY_TRACT | Status: AC
  Administered 2016-04-19: 2.5 mg via RESPIRATORY_TRACT

## 2016-04-19 MED ORDER — BUDESONIDE-FORMOTEROL FUMARATE 160-4.5 MCG/ACT IN AERO: 2.0000 | INHALATION_SPRAY | Freq: Two times a day (BID) | RESPIRATORY_TRACT | Status: DC

## 2016-04-19 NOTE — Addendum Note (Signed)
Addended by: Karalee Height on: 04/19/2016 03:34 PM   Modules accepted: Orders

## 2016-04-19 NOTE — Assessment & Plan Note (Signed)
Cont on O2  POC order sent

## 2016-04-19 NOTE — Patient Instructions (Signed)
Restart Symbicort 2 puffs Twice daily  , rinse after use.  Continue on Oxygen 2l/m with activity  Order to home care for POC oxygen device.  Follow up Dr. Clayborn Bigness in 2-3 months and As needed   Please contact office for sooner follow up if symptoms do not improve or worsen or seek emergency care

## 2016-04-19 NOTE — Assessment & Plan Note (Signed)
PFT do shows Moderate airflow obstruction with + BD in mid flows only most likely secondary to chronic asthma as pt is never smoker although she has previous exposure to second hand smoke.   Plan  Restart Symbicort 2 puffs Twice daily  , rinse after use.  Continue on Oxygen 2l/m with activity  Order to home care for POC oxygen device.  Follow up Dr. Clayborn Bigness in 2-3 months and As needed   Please contact office for sooner follow up if symptoms do not improve or worsen or seek emergency car

## 2016-04-19 NOTE — Progress Notes (Signed)
Subjective:    Patient ID: [name], female    DOB: 1930-06-28, 82 y.o.   MRN: [id number]  HPI  82 year old never smoker female Patient was seen for pulmonary consult for pneumonia and COPD 03/08/2016  04/19/2016 Follow up :  Patient returns for a 6 week follow-up. Patient was seen last month for pulmonary consult for pneumonia and possible COPD. Pulmonary function test today shows an FEV1 of 70%, ratio 68, FVC 77%, minimum bronchodilator response at 8%. Mid flows with a positive bronchodilator response. DLCO 33%. She was started on Symbicort and oxygen at 2 L with activity. Last visit. Not taking symbicort for 2 weeks, misunderstood instructions.  She says since her last visit. She is feeling improved with decreased shortness of breath.. Previous CT chest 03/02/2016 showed clear lungs with some mucus plugging in the lower lobe bronchi.  Under a lot of stress , both and her husband are very sick. Husband to go into surgery this week .    Past Medical History  Diagnosis Date  . A-fib (HCC)   . Stroke (HCC)   . Pacemaker   . Arthritis   . Diabetes mellitus     x 10 yrs at least  . Anxiety   . Polio   . Hx of echocardiogram 09/2012    showed Ef 35-40% with no significant valve disease except for mild to moderate MR. normal RVSP and left atrium was severly dilated.  Marland Kitchen History of stress test 08/2011    negative for ischemia it was nongated and nondiagnostic and compatible with a possible scar or peri-infraction ischemia.   . Coronary artery disease 12/2007  . Hypertension   . Thyroid disease     hypothyroidism  . Bronchitis 02/2016   Current Outpatient Prescriptions on File Prior to Visit  Medication Sig Dispense Refill  . albuterol (PROVENTIL HFA;VENTOLIN HFA) 108 (90 Base) MCG/ACT inhaler Inhale 1-2 puffs into the lungs every 6 (six) hours as needed for wheezing or shortness of breath. 1 Inhaler 3  . ALPRAZolam (XANAX) 0.25 MG tablet Take 1 tablet (0.25 mg total) by mouth 2  (two) times daily. 60 tablet 0  . benzonatate (TESSALON) 100 MG capsule Take 1 capsule (100 mg total) by mouth 3 (three) times daily as needed for cough. 20 capsule 0  . budesonide-formoterol (SYMBICORT) 160-4.5 MCG/ACT inhaler Inhale 2 puffs into the lungs 2 (two) times daily. 1 Inhaler 0  . dicyclomine (BENTYL) 10 MG capsule 1 capsule by mouth twice daily as needed for abdominal pain or cramps. 60 capsule 5  . digoxin (DIGOX) 0.125 MG tablet Take 1 tablet (125 mcg total) by mouth daily. <PLEASE MAKE APPOINTMENT with Dr. Tresa Endo FOR REFILLS> 90 tablet 0  . guaiFENesin-dextromethorphan (ROBITUSSIN DM) 100-10 MG/5ML syrup Take 10 mLs by mouth every 6 (six) hours. 236 mL 0  . isosorbide mononitrate (IMDUR) 30 MG 24 hr tablet TAKE 1 TABLET BY MOUTH DAILY 30 tablet 5  . levothyroxine (SYNTHROID, LEVOTHROID) 100 MCG tablet TAKE 1 TABLET BY MOUTH EVERY DAY 30 tablet 5  . metoprolol (LOPRESSOR) 50 MG tablet Take 1 tablet (50 mg total) by mouth 2 (two) times daily. KEEP OV. 60 tablet 0  . NONFORMULARY OR COMPOUNDED ITEM Estradiol 0.02 % 1ml prefilled applicator Sig: apply twice a week 24 each 4  . predniSONE (DELTASONE) 20 MG tablet Take 2 tablets (40 mg total) by mouth daily with breakfast. 10 tablet 0  . traMADol (ULTRAM) 50 MG tablet Take 25-50 mg by mouth every  6 (six) hours as needed for pain.     Marland Kitchen warfarin (COUMADIN) 2.5 MG tablet TAKE 1 TABLET BY MOUTH DAILY AS DIRECTED BY COUMADIN CLINIC (Patient taking differently: TAKES 1/2 TAB BY MOUTH IN EVENINGS ALL DAYS OF WEEK) 30 tablet 0  . saccharomyces boulardii (FLORASTOR) 250 MG capsule Take 1 capsule (250 mg total) by mouth 2 (two) times daily. (Patient not taking: Reported on 04/19/2016) 14 capsule 0  . YUVAFEM 10 MCG TABS vaginal tablet UNWRAP AND INSERT 1 TABLET IN VAGINA 2 TIMES A WEEK (Patient not taking: Reported on 04/19/2016) 25 tablet 11   No current facility-administered medications on file prior to visit.      Review of  Systems Constitutional:   No  weight loss, night sweats,  Fevers, chills, + fatigue, or  lassitude.  HEENT:   No headaches,  Difficulty swallowing,  Tooth/dental problems, or  Sore throat,                No sneezing, itching, ear ache, nasal congestion, post nasal drip,   CV:  No chest pain,  Orthopnea, PND, swelling in lower extremities, anasarca, dizziness, palpitations, syncope.   GI  No heartburn, indigestion, abdominal pain, nausea, vomiting, diarrhea, change in bowel habits, loss of appetite, bloody stools.   Resp   No excess mucus, no productive cough,  No non-productive cough,  No coughing up of blood.  No change in color of mucus.  No wheezing.  No chest wall deformity  Skin: no rash or lesions.  GU: no dysuria, change in color of urine, no urgency or frequency.  No flank pain, no hematuria   MS:  No joint pain or swelling.  No decreased range of motion.  No back pain.  Psych:  No change in mood or affect. No depression or anxiety.  No memory loss.         Objective:   Physical Exam Filed Vitals:   04/19/16 1446  BP: 140/64  Pulse: 68  Temp: 97.6 F (36.4 C)  TempSrc: Oral  Height: 5\' 9"  (1.753 m)  Weight: 158 lb (71.668 kg)  SpO2: 100%   GEN: A/Ox3; pleasant , NAD, frail and elderly   HEENT:  [place]/AT,  EACs-clear, TMs-wnl, NOSE-clear, THROAT-clear, no lesions, no postnasal drip or exudate noted.   NECK:  Supple w/ fair ROM; no JVD; normal carotid impulses w/o bruits; no thyromegaly or nodules palpated; no lymphadenopathy.  RESP  Decreased BS in bases , .no accessory muscle use, no dullness to percussion  CARD:  RRR, no m/r/g  , tr to 1+  peripheral edema, pulses intact, no cyanosis or clubbing.  GI:   Soft & nt; nml bowel sounds; no organomegaly or masses detected.  Musco: Warm bil, no deformities or joint swelling noted.   Neuro: alert, no focal deficits noted.    Skin: Warm, no lesions or rashes  Tammy Parrett NP-C  [facility]  04/19/2016        Assessment & Plan:

## 2016-04-25 ENCOUNTER — Other Ambulatory Visit: Payer: Self-pay | Admitting: Family

## 2016-04-27 ENCOUNTER — Telehealth: Payer: Self-pay | Admitting: Cardiovascular Disease

## 2016-04-27 ENCOUNTER — Encounter: Payer: Medicare HMO | Admitting: *Deleted

## 2016-04-27 ENCOUNTER — Telehealth: Payer: Self-pay | Admitting: Cardiology

## 2016-04-27 NOTE — Telephone Encounter (Signed)
Spoke with pt husband, okay given for pt to use imodium.

## 2016-04-27 NOTE — Telephone Encounter (Signed)
Spoke with pt and reminded pt of remote transmission that is due today. Pt verbalized understanding.   

## 2016-04-27 NOTE — Telephone Encounter (Signed)
[name] is calling because [name] is wanting to know if she can take  a Loperamide HCI mg . She has already taken a OTC [facility] brand for Anti-Diarrhea. Please call   Thanks

## 2016-04-30 ENCOUNTER — Encounter: Payer: Self-pay | Admitting: Cardiology

## 2016-05-04 ENCOUNTER — Ambulatory Visit (INDEPENDENT_AMBULATORY_CARE_PROVIDER_SITE_OTHER): Payer: Medicare HMO | Admitting: *Deleted

## 2016-05-04 ENCOUNTER — Telehealth: Payer: Self-pay | Admitting: Cardiovascular Disease

## 2016-05-04 DIAGNOSIS — I495 Sick sinus syndrome: Secondary | ICD-10-CM

## 2016-05-04 NOTE — Telephone Encounter (Signed)
New Message  Pt husband calling to see if pt remote pacer check was received. Please call back to discuss

## 2016-05-04 NOTE — Telephone Encounter (Signed)
Spoke w/ pt and informed her that we did receive her remote transmission. Pt verbalized understanding.  

## 2016-05-06 NOTE — Progress Notes (Signed)
Remote pacemaker transmission.   

## 2016-05-10 ENCOUNTER — Telehealth: Payer: Self-pay | Admitting: *Deleted

## 2016-05-10 ENCOUNTER — Telehealth: Payer: Self-pay | Admitting: Gastroenterology

## 2016-05-10 NOTE — Telephone Encounter (Signed)
Received fax from [place] that Symbicort has already been approved until 11/28/2016. Called [facility] and informed the pharmacy.

## 2016-05-10 NOTE — Telephone Encounter (Signed)
Patient is wanting to transfer to our office from [facility]. She states that she is not getting the best of care there. She is wanting to be seen for abd pain and diarrhea. Records placed on Dr. Irving Burton desk for review.

## 2016-05-10 NOTE — Telephone Encounter (Signed)
Submitted PA thru [facility]. Key: [id number]  Submitted for review.  [id number] - [place], [place] - [address] AT [address] OF [address] [phone] (Phone) [phone] (Fax)

## 2016-05-11 ENCOUNTER — Encounter: Payer: Self-pay | Admitting: Gastroenterology

## 2016-05-11 NOTE — Telephone Encounter (Signed)
Dr. Danis reviewed records and has accepted patient. Ok to schedule OV. Appointment scheduled. °

## 2016-05-13 LAB — CUP PACEART REMOTE DEVICE CHECK
Battery Impedance: 2142 Ohm
Brady Statistic RV Percent Paced: 53 %
Date Time Interrogation Session: 20170606170053
Implantable Lead Location: 753860
Lead Channel Setting Sensing Sensitivity: 2.8 mV
MDC IDC LEAD IMPLANT DT: 20111017
MDC IDC MSMT BATTERY REMAINING LONGEVITY: 37 mo
MDC IDC MSMT BATTERY VOLTAGE: 2.74 V
MDC IDC MSMT LEADCHNL RA IMPEDANCE VALUE: 0 Ohm
MDC IDC MSMT LEADCHNL RV IMPEDANCE VALUE: 647 Ohm
MDC IDC SET LEADCHNL RV PACING AMPLITUDE: 2.5 V
MDC IDC SET LEADCHNL RV PACING PULSEWIDTH: 0.4 ms

## 2016-05-19 ENCOUNTER — Encounter: Payer: Self-pay | Admitting: Cardiology

## 2016-05-29 ENCOUNTER — Other Ambulatory Visit: Payer: Self-pay | Admitting: Family

## 2016-05-31 ENCOUNTER — Encounter: Payer: Self-pay | Admitting: Gastroenterology

## 2016-05-31 ENCOUNTER — Ambulatory Visit (INDEPENDENT_AMBULATORY_CARE_PROVIDER_SITE_OTHER): Payer: Medicare HMO | Admitting: Gastroenterology

## 2016-05-31 VITALS — BP 154/82 | Ht 69.0 in | Wt 162.0 lb

## 2016-05-31 DIAGNOSIS — K589 Irritable bowel syndrome without diarrhea: Secondary | ICD-10-CM

## 2016-05-31 DIAGNOSIS — F411 Generalized anxiety disorder: Secondary | ICD-10-CM | POA: Diagnosis not present

## 2016-05-31 MED ORDER — DICYCLOMINE HCL 10 MG PO CAPS
ORAL_CAPSULE | ORAL | Status: DC
Start: 2016-05-31 — End: 2017-11-18

## 2016-05-31 NOTE — Patient Instructions (Addendum)
If you are age 79 or older, your body mass index should be between 23-30. Your Body mass index is 23.91 kg/(m^2). If this is out of the aforementioned range listed, please consider follow up with your Primary Care Provider.  If you are age 10 or younger, your body mass index should be between 19-25. Your Body mass index is 23.91 kg/(m^2). If this is out of the aformentioned range listed, please consider follow up with your Primary Care Provider.   We have sent the following medications to your pharmacy for you to pick up at your convenience: Bentyl  Thank you for choosing [facility]  Dr Amada Jupiter III

## 2016-05-31 NOTE — Progress Notes (Signed)
[facility] Consult Note:  History: [name] 05/31/2016  Referring physician: Juliette Alcide, MD  Reason for consult/chief complaint: Abdominal Pain and Diarrhea   Subjective HPI:  This is an 82 year old woman who came today along with her husband, apparently seeking a second opinion on her chronic abdominal pain and diarrhea. She is a very distracted and tangential and limited historian.  She freely goes on about issues from her childhood, and how her stress is related to the fact that [place] with no one around for her to talk to. I reviewed available records from the GI clinic in [place], and a colonoscopy done at an outside institution in 2001. She appears to have very long-standing tendencies to chronic abdominal pain and need for urgent loose bowel movements. She was diagnosed with IBS many years ago, colonoscopy in 2001 was incomplete due to marked sigmoid tortuosity and patient discomfort. Last year she was treated for C. difficile with vancomycin, but and afterward had negative repeat C. difficile testing. She wonders if she may have some food intolerances, she definitely believes that stress is exacerbated any symptoms. She is been prescribed Bentyl, but her husband says she does not take it regularly because she did not know what it was for. Sent to gynecology for concern of a rectocele, and review of that note indicates that she does have a grade 1-2 rectocele discovered at the time at her pessary was replaced in April 2017. She and her husband say to have no knowledge of that, and in fact seemed to be confused about all of her diagnoses and really were hoping they could come here today "and get everything explained to [place]".    ROS:  Review of Systems  Constitutional: Positive for fatigue. Negative for appetite change and unexpected weight change.  HENT: Negative for mouth sores and voice change.   Eyes: Negative for pain  and redness.  Respiratory: Positive for shortness of breath. Negative for cough.   Cardiovascular: Negative for chest pain and palpitations.  Gastrointestinal: Positive for abdominal pain and diarrhea.  Genitourinary: Positive for difficulty urinating. Negative for dysuria and hematuria.  Musculoskeletal: Positive for myalgias. Negative for arthralgias.  Skin: Negative for pallor and rash.  Neurological: Negative for weakness and headaches.  Hematological: Negative for adenopathy.  Psychiatric/Behavioral: The patient is nervous/anxious.      Past Medical History: Past Medical History  Diagnosis Date  . A-fib (HCC)   . Stroke (HCC)   . Pacemaker   . Arthritis   . Diabetes mellitus     x 10 yrs at least  . Anxiety   . Polio   . Hx of echocardiogram 09/2012    showed Ef 35-40% with no significant valve disease except for mild to moderate MR. normal RVSP and left atrium was severly dilated.  Marland Kitchen History of stress test 08/2011    negative for ischemia it was nongated and nondiagnostic and compatible with a possible scar or peri-infraction ischemia.   . Coronary artery disease 12/2007  . Hypertension   . Thyroid disease     hypothyroidism  . Bronchitis 02/2016     Past Surgical History: Past Surgical History  Procedure Laterality Date  . Cardiac surgery    . 3 cardiac stents      she has had non-DES stent to the mid circumflex in 2009 and LAD stent to the mid-distal LAD  BMS June 18, 2010  . Abdominal hysterectomy  1978  TAH/BSO  . Tonsillectomy    . Appendectomy    . Pacemaker placement  08/11/2010    Medtronic Adapta   . Coronary angioplasty    . Left heart catheterization with coronary angiogram N/A 05/10/2013    Procedure: LEFT HEART CATHETERIZATION WITH CORONARY ANGIOGRAM;  Surgeon: Lennette Bihari, MD;  Location: [facility] CATH LAB;  Service: Cardiovascular;  Laterality: N/A;  . Transthoracic echocardiogram  01/17/2009    EF=>55%.IMPAIRED LV RELAXATION.LA IS MODERATELY  DILATED.RA IS MILDLY DILATED. RV SYSTOLIC PRESSURE IS ELEVATED AT 30-40MMHG.MILD TO MODERATE TR.MILD MR.SINCE PRIOR TTE,BOTH ATRIA APPEAR TO BE MORE DILATED.  . Stress perfusion test  10/22/2009    MILD ISCHEMIA IN THE BASAL ANTEROLATERAL AND MID ANTEROLATERAL REGIONS.EF 52%.     Family History: Family History  Problem Relation Age of Onset  . Heart disease Father   . Cancer Mother     Social History: Social History   Social History  . Marital Status: Married    Spouse Name: N/A  . Number of Children: N/A  . Years of Education: N/A   Social History Main Topics  . Smoking status: Never Smoker   . Smokeless tobacco: Never Used  . Alcohol Use: No  . Drug Use: No  . Sexual Activity: Not Asked   Other Topics Concern  . None   Social History Narrative    Allergies: Allergies  Allergen Reactions  . Iohexol Other (See Comments)     Desc: CHEST PAIN, LOC AFTER HEART CATH, PT REFUSED DYE   . Penicillins Other (See Comments)    Told not to take medication  . Sulfa Antibiotics Other (See Comments)    Told not to take med    Outpatient Meds: Current Outpatient Prescriptions  Medication Sig Dispense Refill  . albuterol (PROVENTIL HFA;VENTOLIN HFA) 108 (90 Base) MCG/ACT inhaler Inhale 1-2 puffs into the lungs every 6 (six) hours as needed for wheezing or shortness of breath. 1 Inhaler 3  . ALPRAZolam (XANAX) 0.25 MG tablet Take 1 tablet (0.25 mg total) by mouth 2 (two) times daily. 60 tablet 0  . benzonatate (TESSALON) 100 MG capsule Take 1 capsule (100 mg total) by mouth 3 (three) times daily as needed for cough. 20 capsule 0  . dicyclomine (BENTYL) 10 MG capsule 1 capsule by mouth twice daily as needed for abdominal pain or cramps. 60 capsule 3  . digoxin (DIGOX) 0.125 MG tablet Take 1 tablet (125 mcg total) by mouth daily. <PLEASE MAKE APPOINTMENT with Dr. Tresa Endo FOR REFILLS> 90 tablet 0  . guaiFENesin-dextromethorphan (ROBITUSSIN DM) 100-10 MG/5ML syrup Take 10 mLs by mouth  every 6 (six) hours. 236 mL 0  . isosorbide mononitrate (IMDUR) 30 MG 24 hr tablet TAKE 1 TABLET BY MOUTH DAILY 30 tablet 5  . levothyroxine (SYNTHROID, LEVOTHROID) 100 MCG tablet TAKE 1 TABLET BY MOUTH EVERY DAY 30 tablet 5  . loperamide (IMODIUM) 2 MG capsule Take 2 mg by mouth as needed for diarrhea or loose stools.    . metoprolol (LOPRESSOR) 50 MG tablet Take 1 tablet (50 mg total) by mouth 2 (two) times daily. KEEP OV. 60 tablet 0  . NONFORMULARY OR COMPOUNDED ITEM Estradiol 0.02 % 1ml prefilled applicator Sig: apply twice a week 24 each 4  . traMADol (ULTRAM) 50 MG tablet Take 25-50 mg by mouth every 6 (six) hours as needed for pain.     Marland Kitchen warfarin (COUMADIN) 2.5 MG tablet TAKE 1 TABLET BY MOUTH DAILY AS DIRECTED BY COUMADIN CLINIC (Patient taking differently:  TAKES 1/2 TAB BY MOUTH IN EVENINGS ALL DAYS OF WEEK) 30 tablet 0   No current facility-administered medications for this visit.      ___________________________________________________________________ Objective  Exam:  BP 154/82 mmHg  Ht 5\' 9"  (1.753 m)  Wt 162 lb (73.483 kg)  BMI 23.91 kg/m2   General: this is a(n) Chronically ill-appearing anxious elderly woman, wearing supplemental oxygen   Eyes: sclera anicteric, no redness  ENT: oral mucosa moist without lesions, no cervical or supraclavicular lymphadenopathy, good dentition  CV: RRR without murmur, S1/S2, no JVD, no peripheral edema  Resp: Generalized decreased breath sounds bilaterally, normal RR and effort noted  GI: soft, mild suprapubic tenderness, with active bowel sounds. No guarding or palpable organomegaly noted.  Skin; warm and dry, no rash or jaundice noted  Neuro: awake, alert and oriented x 3. Normal gross motor function and fluent speech  Labs:  On 03/04/2016, CBC normal Negative C. difficile toxin 02/11/2015 and 03/06/2015  Radiologic Studies:  CT colonography 02/2014  Assessment: Encounter Diagnoses  Name Primary?  . IBS  (irritable bowel syndrome) Yes  . Anxiety state   This patient has long-standing, classic IBS-D, with prominent symptoms of tenesmus. There is certainly a significant contribution of anxiety in this patient. She appears to have had his extensive workup as is feasible given the reported difficulty with scope passage in her tortuous colon. She is a poor candidate for repeat colonoscopy given her severe medical issues in the reported anatomic challenges of her sigmoid colon, but seems unlikely she has microscopic colitis. I have refilled her Bentyl, but I feel have nothing further to offer her. I will see her if urgent GI issues arise   Charlie Pitter III  CC: Juliette Alcide, MD

## 2016-06-03 ENCOUNTER — Encounter: Payer: Self-pay | Admitting: Cardiology

## 2016-06-03 ENCOUNTER — Telehealth: Payer: Self-pay

## 2016-06-03 NOTE — Telephone Encounter (Signed)
RX prior auth sent on  06-02-2016 for Dicyclomine 10 mg bid as needed for abdominal pain. [employer] approved from 11-28-2015  To 11-27-2016 reference number [id number]. Pts pharmacy has been notified.

## 2016-06-07 ENCOUNTER — Encounter: Payer: Self-pay | Admitting: Internal Medicine

## 2016-06-23 ENCOUNTER — Ambulatory Visit (INDEPENDENT_AMBULATORY_CARE_PROVIDER_SITE_OTHER): Payer: Medicare HMO | Admitting: Internal Medicine

## 2016-07-01 ENCOUNTER — Encounter: Payer: Self-pay | Admitting: Pulmonary Disease

## 2016-07-01 ENCOUNTER — Ambulatory Visit (INDEPENDENT_AMBULATORY_CARE_PROVIDER_SITE_OTHER): Payer: Medicare HMO | Admitting: Pulmonary Disease

## 2016-07-01 DIAGNOSIS — J439 Emphysema, unspecified: Secondary | ICD-10-CM | POA: Diagnosis not present

## 2016-07-01 DIAGNOSIS — J9611 Chronic respiratory failure with hypoxia: Secondary | ICD-10-CM | POA: Diagnosis not present

## 2016-07-01 DIAGNOSIS — I482 Chronic atrial fibrillation, unspecified: Secondary | ICD-10-CM

## 2016-07-01 NOTE — Assessment & Plan Note (Signed)
Nonsmoker. Significant secondhand smoke with husband. Also had wood fire exposure. PFT (03/2016) FEV1  1.5  65%, DLCO 33% Chest CT scan (April/2017): COPD changes. Probably moderate. Mucous plug. Slow to improve.  Needed oxygen with exertion. O2 saturation at rest was 89% on room air. With walking, she dropped to 81% on room air. She needed 2 L to keep her O2 sats ration more than 88%.  Overall, shortness of breath is stable. We restarted Symbicort back in May but she stopped it since it was not making a lot of difference. She feels better with oxygen, 2 L 24 7.  Plan : 1. Hold off on symbicort. Restart when she is more symptomatic.  2. Cont albuterol, 2 puffs every 4 hours as needed. 3. Cont O2 2L  24/7.  4. D/w re flu vaccine.  she received pneumonia vaccine in winter of 2017. I suspect that's pneumonia 23. She'll probably need Prevnar ICU from that time.Marland Kitchen

## 2016-07-01 NOTE — Progress Notes (Signed)
Subjective:    Patient ID: [name], female    DOB: Sep 27, 1930, 82 y.o.   MRN: [id number]  HPI Pt is here for consultation by Dr. Richarda Overlie  for her PNA/COPD.  Non smoker. Not been dxed with asthma or copd. Exposed to 2nd hand smoke with husband. Also exposed to burning wood.  Has CAD, chronic afib. On coumadin.  She was her usual self until 2-3 weeks ago. Had bronchitis which did not get better with keflex. Pt was admitted from April 4-7 for cough, SOB. CT scan w/o contrast  showed mucus plugs. Cardiac w/u was UR.  D/c with levaquin. Finishing abx. Some better. Still coughs, sob. (-) fevers, chills.  Her o2 sats was 89% on RA sitting.   ROV 07/01/16 Patient saw Rikki Spearing in May as a follow-up and was noted to be doing better. She was restarted on Symbicort. PFT shows moderate obstructive ventilatory defect. She stopped Symbicort since it was not making any difference. Currently, her dyspnea is better. She gets more winded with more than ADLs but does not happen often. Cough is significantly improved. Has not been admitted nor been on antibiotics since last seen. She uses oxygen, 2 L 24 7. Feels better using it.  Review of Systems  Constitutional: Negative.  Negative for chills and fever.  HENT: Positive for congestion.   Eyes: Negative.   Respiratory: Positive for cough and shortness of breath.   Cardiovascular: Negative for chest pain and leg swelling.  Gastrointestinal: Negative for abdominal distention.  Endocrine: Negative.   Genitourinary: Negative.   Musculoskeletal: Negative.   Allergic/Immunologic: Negative.   Neurological: Negative.   Hematological: Negative.   Psychiatric/Behavioral: Negative.   All other systems reviewed and are negative.     Objective:   Physical Exam  Vitals:  Vitals:   07/01/16 1352  BP: (!) 122/58  Pulse: 60  SpO2: 98%  Weight: 165 lb (74.8 kg)  Height: 5\' 9"  (1.753 m)  89% on RA sitting, improved to 91% on 2L  O2  Constitutional/General:  Pleasant, well-nourished, well-developed, not in any distress,  Comfortably seating.  Well kempt chronically ill  Body mass index is 24.37 kg/m. Wt Readings from Last 3 Encounters:  07/01/16 165 lb (74.8 kg)  05/31/16 162 lb (73.5 kg)  04/19/16 158 lb (71.7 kg)     HEENT: Pupils equal and reactive to light and accommodation. Anicteric sclerae. Normal nasal mucosa.   No oral  lesions,  mouth clear,  oropharynx clear, no postnasal drip. (-) Oral thrush. No dental caries.  Airway - Mallampati class III  Neck: No masses. Midline trachea. No JVD, (-) LAD. (-) bruits appreciated.  Respiratory/Chest: Grossly normal chest. (-) deformity. (-) Accessory muscle use.  Symmetric expansion. (-) Tenderness on palpation.  Resonant on percussion.  Diminished BS on both lower lung zones. (-) wheezing, crackles, rhonchi (-) egophony  Cardiovascular: Regular rate and  rhythm, heart sounds normal, no murmur or gallops, no peripheral edema  Gastrointestinal:  Normal bowel sounds. Soft, non-tender. No hepatosplenomegaly.  (-) masses.   Musculoskeletal:  Normal muscle tone. Normal gait.   Extremities: Grossly normal. (-) clubbing, cyanosis.  (-) edema  Skin: (-) rash,lesions seen.   Neurological/Psychiatric : alert, oriented to time, place, person. Normal mood and affect            Assessment & Plan:  COPD (chronic obstructive pulmonary disease) (HCC) Nonsmoker. Significant secondhand smoke with husband. Also had wood fire exposure. PFT (03/2016) FEV1  1.5  65%,  DLCO 33% Chest CT scan (April/2017): COPD changes. Probably moderate. Mucous plug. Slow to improve.  Needed oxygen with exertion. O2 saturation at rest was 89% on room air. With walking, she dropped to 81% on room air. She needed 2 L to keep her O2 sats ration more than 88%.  Overall, shortness of breath is stable. We restarted Symbicort back in May but she stopped it since it was not making a  lot of difference. She feels better with oxygen, 2 L 24 7.  Plan : 1. Hold off on symbicort. Restart when she is more symptomatic.  2. Cont albuterol, 2 puffs every 4 hours as needed. 3. Cont O2 2L  24/7.  4. D/w re flu vaccine.  she received pneumonia vaccine in winter of 2017. I suspect that's pneumonia 23. She'll probably need Prevnar ICU from that time..   Chronic respiratory failure (HCC) Cont on O2 2L 24/7.  Chronic atrial fibrillation (HCC) 2-D echo (April 2017) EF 55%. Cont with  Coumadin. Follow-up with Dr. Tresa Endo, cardiologist.  Return to clinic in 6 mos.      Pollie Meyer, MD 07/01/2016   4:11 PM Pulmonary and Critical Care Medicine [facility]  h h Pager: [phone] Office: [phone], Fax: [phone]

## 2016-07-01 NOTE — Assessment & Plan Note (Signed)
2-D echo (April 2017) EF 55%. Cont with  Coumadin. Follow-up with Dr. Tresa Endo, cardiologist.

## 2016-07-01 NOTE — Assessment & Plan Note (Signed)
Cont on O2 2L 24/7.

## 2016-07-01 NOTE — Patient Instructions (Signed)
It was a pleasure taking care of you today!  You are diagnosed with Chronic Obstructive Pulmonary Disease or COPD.  COPD is a preventable and treatable disease that makes it difficult to empty air out of the lungs (airflow obstruction).  This can lead to shortness of breath.   Sometimes, when you have a lung infection, this can make your breathing worse, and will cause you to have a COPD flare-up or an acute exacerbation of COPD. Please call your primary care doctor or the office if you are having a COPD flare-up.   Smoking makes COPD worse.   Hold off on Symbicort unless you are more symptomatic with breathing.  Rescue medications: Albuterol 2 puffs every 4 hours as needed for shortness of breath.   Please rinse your mouth each time you use your maintenance medication.  Please call the office if you are having issues with your medications   Return to clinic in 6 months.

## 2016-07-08 ENCOUNTER — Ambulatory Visit: Payer: Medicare HMO | Admitting: Gastroenterology

## 2016-07-28 ENCOUNTER — Other Ambulatory Visit: Payer: Self-pay | Admitting: Cardiovascular Disease

## 2016-08-05 ENCOUNTER — Encounter: Payer: Medicare HMO | Admitting: *Deleted

## 2016-08-05 ENCOUNTER — Telehealth: Payer: Self-pay | Admitting: Cardiology

## 2016-08-05 NOTE — Telephone Encounter (Signed)
LMOVM reminding pt to send remote transmission.   

## 2016-08-06 ENCOUNTER — Other Ambulatory Visit: Payer: Self-pay

## 2016-08-06 ENCOUNTER — Encounter: Payer: Self-pay | Admitting: Cardiology

## 2016-08-06 ENCOUNTER — Other Ambulatory Visit: Payer: Self-pay | Admitting: Cardiovascular Disease

## 2016-08-06 MED ORDER — ISOSORBIDE MONONITRATE ER 30 MG PO TB24: 30.0000 mg | ORAL_TABLET | Freq: Every day | ORAL | 9 refills | Status: DC

## 2016-09-09 ENCOUNTER — Other Ambulatory Visit: Payer: Self-pay | Admitting: Cardiovascular Disease

## 2016-09-14 ENCOUNTER — Ambulatory Visit (INDEPENDENT_AMBULATORY_CARE_PROVIDER_SITE_OTHER): Payer: Medicare HMO | Admitting: *Deleted

## 2016-09-14 DIAGNOSIS — I495 Sick sinus syndrome: Secondary | ICD-10-CM | POA: Diagnosis not present

## 2016-09-14 NOTE — Progress Notes (Signed)
Remote pacemaker transmission.   

## 2016-09-15 ENCOUNTER — Encounter: Payer: Self-pay | Admitting: Cardiology

## 2016-09-28 ENCOUNTER — Encounter: Payer: Self-pay | Admitting: Adult Health

## 2016-09-28 ENCOUNTER — Ambulatory Visit (INDEPENDENT_AMBULATORY_CARE_PROVIDER_SITE_OTHER): Payer: Medicare HMO | Admitting: Adult Health

## 2016-09-28 DIAGNOSIS — Z23 Encounter for immunization: Secondary | ICD-10-CM | POA: Diagnosis not present

## 2016-09-28 DIAGNOSIS — J441 Chronic obstructive pulmonary disease with (acute) exacerbation: Secondary | ICD-10-CM | POA: Diagnosis not present

## 2016-09-28 LAB — CUP PACEART REMOTE DEVICE CHECK
Battery Impedance: 2321 Ohm
Battery Remaining Longevity: 34 mo
Battery Voltage: 2.74 V
Implantable Lead Location: 753860
Implantable Lead Model: 5076
Implantable Pulse Generator Implant Date: 20111017
Lead Channel Impedance Value: 694 Ohm
Lead Channel Pacing Threshold Pulse Width: 0.4 ms
Lead Channel Setting Pacing Pulse Width: 0.4 ms
Lead Channel Setting Sensing Sensitivity: 2 mV
MDC IDC LEAD IMPLANT DT: 20111017
MDC IDC MSMT LEADCHNL RA IMPEDANCE VALUE: 0 Ohm
MDC IDC MSMT LEADCHNL RV PACING THRESHOLD AMPLITUDE: 0.375 V
MDC IDC MSMT LEADCHNL RV SENSING INTR AMPL: 5.6 mV
MDC IDC SESS DTM: 20171017123109
MDC IDC SET LEADCHNL RV PACING AMPLITUDE: 2.5 V
MDC IDC STAT BRADY RV PERCENT PACED: 52 %

## 2016-09-28 MED ORDER — BUDESONIDE-FORMOTEROL FUMARATE 160-4.5 MCG/ACT IN AERO: 2.0000 | INHALATION_SPRAY | Freq: Two times a day (BID) | RESPIRATORY_TRACT | 5 refills | Status: AC

## 2016-09-28 NOTE — Progress Notes (Signed)
Subjective:    Patient ID: [name], female    DOB: 1930-09-25, 82 y.o.   MRN: [id number]  HPI  82 year old never smoker female followed for moderate COPD ?asthma  Patient was seen for pulmonary consult for pneumonia and COPD 03/08/2016 Second hand smoke and wood stove exposure.   09/28/2016 Acute OV  Pt returns for increased cough for last few days. She has moderate airflow obstruction on PFT .  Pulmonary function test in May FEV1 of 70%, ratio 68, FVC 77%, minimum bronchodilator response at 8%. Mid flows with a positive bronchodilator response. DLCO 33%. She was previously on Symbicort and oxygen at 2 L with activity for exertional desats.  She stopped Symbicort because she was doing better. Previous CT chest 03/02/2016 showed clear lungs with some mucus plugging in the lower lobe bronchi. Tried to do FeNO testing but she was uanble to complete this.  She denies chest pian, orthopnea, edema or fever, discolored mucus.  Complains of dry cough and intermittent wheezing for last week. Seems to have started when they started using her wood stove. That is their main heating supply. We dsicussed this may be a trigger for her.   She is accompanied by her husband. Says she has mild dementia.     Past Medical History:  Diagnosis Date  . A-fib (HCC)   . Anxiety   . Arthritis   . Bronchitis 02/2016  . Coronary artery disease 12/2007  . Diabetes mellitus    x 10 yrs at least  . History of stress test 08/2011   negative for ischemia it was nongated and nondiagnostic and compatible with a possible scar or peri-infraction ischemia.   Marland Kitchen Hx of echocardiogram 09/2012   showed Ef 35-40% with no significant valve disease except for mild to moderate MR. normal RVSP and left atrium was severly dilated.  . Hypertension   . Pacemaker   . Polio   . Stroke (HCC)   . Thyroid disease    hypothyroidism   Current Outpatient Prescriptions on File Prior to Visit  Medication Sig Dispense Refill  .  albuterol (PROVENTIL HFA;VENTOLIN HFA) 108 (90 Base) MCG/ACT inhaler Inhale 1-2 puffs into the lungs every 6 (six) hours as needed for wheezing or shortness of breath. 1 Inhaler 3  . ALPRAZolam (XANAX) 0.25 MG tablet Take 1 tablet (0.25 mg total) by mouth 2 (two) times daily. 60 tablet 0  . dicyclomine (BENTYL) 10 MG capsule 1 capsule by mouth twice daily as needed for abdominal pain or cramps. 60 capsule 3  . DIGOX 125 MCG tablet TAKE 1 TABLET BY MOUTH DAILY 90 tablet 0  . guaiFENesin-dextromethorphan (ROBITUSSIN DM) 100-10 MG/5ML syrup Take 10 mLs by mouth every 6 (six) hours. 236 mL 0  . isosorbide mononitrate (IMDUR) 30 MG 24 hr tablet Take 1 tablet (30 mg total) by mouth daily. 30 tablet 9  . levothyroxine (SYNTHROID, LEVOTHROID) 100 MCG tablet TAKE 1 TABLET BY MOUTH EVERY DAY 30 tablet 5  . loperamide (IMODIUM) 2 MG capsule Take 2 mg by mouth as needed for diarrhea or loose stools.    . metoprolol (LOPRESSOR) 50 MG tablet Take 1 tablet (50 mg total) by mouth 2 (two) times daily. KEEP OV. 60 tablet 0  . traMADol (ULTRAM) 50 MG tablet Take 25-50 mg by mouth every 6 (six) hours as needed for pain.     Marland Kitchen warfarin (COUMADIN) 2.5 MG tablet TAKE 1 TABLET BY MOUTH DAILY AS DIRECTED BY COUMADIN CLINIC (Patient taking differently:  TAKES 1/2 TAB BY MOUTH IN EVENINGS ALL DAYS OF WEEK) 30 tablet 0  . benzonatate (TESSALON) 100 MG capsule Take 1 capsule (100 mg total) by mouth 3 (three) times daily as needed for cough. (Patient not taking: Reported on 09/28/2016) 20 capsule 0  . NONFORMULARY OR COMPOUNDED ITEM Estradiol 0.02 % 1ml prefilled applicator Sig: apply twice a week (Patient not taking: Reported on 09/28/2016) 24 each 4   No current facility-administered medications on file prior to visit.       Review of Systems Constitutional:   No  weight loss, night sweats,  Fevers, chills, + fatigue, or  lassitude.  HEENT:   No headaches,  Difficulty swallowing,  Tooth/dental problems, or  Sore throat,                  No sneezing, itching, ear ache, nasal congestion, post nasal drip,   CV:  No chest pain,  Orthopnea, PND, swelling in lower extremities, anasarca, dizziness, palpitations, syncope.   GI  No heartburn, indigestion, abdominal pain, nausea, vomiting, diarrhea, change in bowel habits, loss of appetite, bloody stools.   Resp   + cough   No chest wall deformity  Skin: no rash or lesions.  GU: no dysuria, change in color of urine, no urgency or frequency.  No flank pain, no hematuria   MS:  No joint pain or swelling.  No decreased range of motion.  No back pain.  Psych:  No change in mood or affect. No depression or anxiety.  + memory loss.         Objective:   Physical Exam Vitals:   09/28/16 0932  BP: 118/68  Pulse: 67  Temp: 97.8 F (36.6 C)  TempSrc: Oral  SpO2: 99%  Weight: 164 lb 12.8 oz (74.8 kg)  Height: 5\' 9"  (1.753 m)   GEN: A/Ox3; pleasant , NAD, frail and elderly    HEENT:  [place]/AT,  EACs-clear, TMs-wnl, NOSE-clear, THROAT-clear, no lesions, no postnasal drip or exudate noted. Poor dentition  NECK:  Supple w/ fair ROM; no JVD; normal carotid impulses w/o bruits; no thyromegaly or nodules palpated; no lymphadenopathy.    RESP  Decreased BS in bases , . no accessory muscle use, no dullness to percussion  CARD:  RRR, no m/r/g  , tr to 1+  peripheral edema, pulses intact, no cyanosis or clubbing.  GI:   Soft & nt; nml bowel sounds; no organomegaly or masses detected.   Musco: Warm bil, no deformities or joint swelling noted.   Neuro: alert, no focal deficits noted.    Skin: Warm, no lesions or rashes  Nidhi Jacome NP-C   Pulmonary and Critical Care  09/28/2016        Assessment & Plan:

## 2016-09-28 NOTE — Assessment & Plan Note (Signed)
COPD /Chronic obstructive asthma w/ previous second hand smoker exposure /wood stove exposure . Suspect wood stove usage is trigger this time discussed with this with pt and husband.  Retry Symbicort again to see if this helps.   Unable to complete FeNO  .  Plan  Patient Instructions  Restart Symbicort 2 puffs Twice daily  , rinse after use.  Flu shot today  Mucinex DM Twice daily  As needed  Cough/congestion .  Continue on Oxygen 2l/m with activity  Follow up Dr. Clayborn Bigness as planned and As needed   Please contact office for sooner follow up if symptoms do not improve or worsen or seek emergency care

## 2016-09-28 NOTE — Patient Instructions (Addendum)
Restart Symbicort 2 puffs Twice daily  , rinse after use.  Flu shot today  Mucinex DM Twice daily  As needed  Cough/congestion .  Continue on Oxygen 2l/m with activity  Follow up Dr. Clayborn Bigness as planned and As needed   Please contact office for sooner follow up if symptoms do not improve or worsen or seek emergency care

## 2016-10-01 ENCOUNTER — Encounter: Payer: Self-pay | Admitting: Cardiology

## 2016-10-25 ENCOUNTER — Other Ambulatory Visit: Payer: Self-pay | Admitting: Cardiovascular Disease

## 2016-10-25 NOTE — Telephone Encounter (Signed)
REFILL 

## 2016-10-29 ENCOUNTER — Emergency Department (HOSPITAL_COMMUNITY): Payer: Medicare HMO

## 2016-10-29 ENCOUNTER — Emergency Department (HOSPITAL_COMMUNITY)
Admission: EM | Admit: 2016-10-29 | Discharge: 2016-10-30 | Disposition: A | Discharge status: Home / Self Care | Payer: Medicare HMO | Attending: Emergency Medicine | Admitting: Emergency Medicine

## 2016-10-29 ENCOUNTER — Encounter (HOSPITAL_COMMUNITY): Payer: Self-pay

## 2016-10-29 DIAGNOSIS — I2510 Atherosclerotic heart disease of native coronary artery without angina pectoris: Secondary | ICD-10-CM | POA: Diagnosis not present

## 2016-10-29 DIAGNOSIS — I10 Essential (primary) hypertension: Secondary | ICD-10-CM | POA: Insufficient documentation

## 2016-10-29 DIAGNOSIS — R1084 Generalized abdominal pain: Secondary | ICD-10-CM | POA: Diagnosis present

## 2016-10-29 DIAGNOSIS — E039 Hypothyroidism, unspecified: Secondary | ICD-10-CM | POA: Insufficient documentation

## 2016-10-29 DIAGNOSIS — Z950 Presence of cardiac pacemaker: Secondary | ICD-10-CM | POA: Insufficient documentation

## 2016-10-29 DIAGNOSIS — K5732 Diverticulitis of large intestine without perforation or abscess without bleeding: Secondary | ICD-10-CM

## 2016-10-29 DIAGNOSIS — J449 Chronic obstructive pulmonary disease, unspecified: Secondary | ICD-10-CM | POA: Diagnosis not present

## 2016-10-29 DIAGNOSIS — Z8673 Personal history of transient ischemic attack (TIA), and cerebral infarction without residual deficits: Secondary | ICD-10-CM | POA: Diagnosis not present

## 2016-10-29 DIAGNOSIS — Z7901 Long term (current) use of anticoagulants: Secondary | ICD-10-CM | POA: Diagnosis not present

## 2016-10-29 LAB — CBC
HCT: 43 % (ref 36.0–46.0)
Hemoglobin: 13.8 g/dL (ref 12.0–15.0)
MCH: 28 pg (ref 26.0–34.0)
MCHC: 32.1 g/dL (ref 30.0–36.0)
MCV: 87.2 fL (ref 78.0–100.0)
PLATELETS: 229 10*3/uL (ref 150–400)
RBC: 4.93 MIL/uL (ref 3.87–5.11)
RDW: 14.7 % (ref 11.5–15.5)
WBC: 11.2 10*3/uL — ABNORMAL HIGH (ref 4.0–10.5)

## 2016-10-29 LAB — COMPREHENSIVE METABOLIC PANEL
ALBUMIN: 4 g/dL (ref 3.5–5.0)
ALK PHOS: 77 U/L (ref 38–126)
ALT: 15 U/L (ref 14–54)
AST: 19 U/L (ref 15–41)
Anion gap: 7 (ref 5–15)
BUN: 20 mg/dL (ref 6–20)
CALCIUM: 9.5 mg/dL (ref 8.9–10.3)
CO2: 30 mmol/L (ref 22–32)
CREATININE: 0.66 mg/dL (ref 0.44–1.00)
Chloride: 102 mmol/L (ref 101–111)
GFR calc Af Amer: >60 mL/min (ref >60)
GFR calc non Af Amer: >60 mL/min (ref >60)
GLUCOSE: 100 mg/dL — AB (ref 65–99)
Potassium: 3.9 mmol/L (ref 3.5–5.1)
SODIUM: 139 mmol/L (ref 135–145)
Total Bilirubin: 0.9 mg/dL (ref 0.3–1.2)
Total Protein: 6.6 g/dL (ref 6.5–8.1)

## 2016-10-29 LAB — URINALYSIS, ROUTINE W REFLEX MICROSCOPIC
BILIRUBIN URINE: NEGATIVE
Glucose, UA: NEGATIVE mg/dL
HGB URINE DIPSTICK: NEGATIVE
Ketones, ur: NEGATIVE mg/dL
Leukocytes, UA: NEGATIVE
Nitrite: NEGATIVE
PROTEIN: NEGATIVE mg/dL
SPECIFIC GRAVITY, URINE: 1.027 (ref 1.005–1.030)
pH: 5 (ref 5.0–8.0)

## 2016-10-29 LAB — LIPASE, BLOOD: Lipase: 18 U/L (ref 11–51)

## 2016-10-29 MED ORDER — BARIUM SULFATE 2.1 % PO SUSP
ORAL | Status: AC
  Filled 2016-10-29: qty 2

## 2016-10-29 NOTE — ED Triage Notes (Signed)
Pt. Reports having sharp abdominal pain last night,.  She denies any n/v/d  Has a hx of IBS , but in the last 24 hours she has been constipated.  She denies any urinary symptoms.  Skin is warm , pink and dry.  Pt. Is on home oxygen  2L  Pt. Is in NAD>

## 2016-10-29 NOTE — ED Provider Notes (Signed)
[facility]-EMERGENCY DEPT Provider Note   CSN: [id number] Arrival date & time: 10/29/16  1557     History   Chief Complaint Chief Complaint  Patient presents with  . Abdominal Pain    HPI [name] is a 82 y.o. female.   Abdominal Pain   This is a recurrent problem. The current episode started yesterday. The problem occurs constantly. Progression since onset: intermittently improving and worsening. The pain is associated with an unknown factor. The pain is located in the generalized abdominal region. The quality of the pain is sharp, aching and tearing. The pain is severe. Associated symptoms include diarrhea, flatus and constipation. Pertinent negatives include fever, melena, nausea, vomiting, dysuria, hematuria and arthralgias. The symptoms are aggravated by certain positions. The symptoms are relieved by flatus.    Past Medical History:  Diagnosis Date  . A-fib (HCC)   . Anxiety   . Arthritis   . Bronchitis 02/2016  . Coronary artery disease 12/2007  . Diabetes mellitus    x 10 yrs at least  . History of stress test 08/2011   negative for ischemia it was nongated and nondiagnostic and compatible with a possible scar or peri-infraction ischemia.   Marland Kitchen Hx of echocardiogram 09/2012   showed Ef 35-40% with no significant valve disease except for mild to moderate MR. normal RVSP and left atrium was severly dilated.  . Hypertension   . Pacemaker   . Polio   . Stroke (HCC)   . Thyroid disease    hypothyroidism    Patient Active Problem List   Diagnosis Date Noted  . Asthma 04/19/2016  . Chronic respiratory failure (HCC) 04/19/2016  . COPD (chronic obstructive pulmonary disease) (HCC) 03/08/2016  . Hypoxemia 03/08/2016  . Acute bronchitis 03/02/2016  . Bronchitis 03/01/2016  . Long term (current) use of anticoagulants [Z79.01] 01/27/2016  . Prolapse of vaginal vault after hysterectomy 01/09/2016  . Atypical chest pain 06/03/2015  . Anxiety state 03/28/2015  . Chest pain  02/04/2015  . Hypertensive heart disease 05/12/2014  . Unspecified constipation 03/05/2014  . Abdominal pain, left lower quadrant 02/14/2014  . Abdominal pain, chronic, right lower quadrant 02/14/2014  . Change in stool 02/14/2014  . Encounter for therapeutic drug monitoring 01/07/2014  . Ataxia 05/11/2013  . Unstable angina (HCC) 05/08/2013  . SSS (sick sinus syndrome)- MDT PTVDP 9/11 05/08/2013  . Cardiomyopathy, suspected to be secondary to AF, not CAD 50-55% at cath June 2014 05/08/2013  . Situational stress 05/08/2013  . Long term current use of anticoagulant therapy 02/19/2013  . History of colonic diverticulitis 01/26/2013  . CAD, non DES to CFX '09, LAD BMS 7/11. Cath OK June 2014 01/23/2013  . Chronic atrial fibrillation (HCC) 01/23/2013  . GERD (gastroesophageal reflux disease) 01/23/2013  . Hypothyroidism 01/23/2013    Past Surgical History:  Procedure Laterality Date  . 3 cardiac stents     she has had non-DES stent to the mid circumflex in 2009 and LAD stent to the mid-distal LAD  BMS June 18, 2010  . ABDOMINAL HYSTERECTOMY  1978   TAH/BSO  . APPENDECTOMY    . CARDIAC SURGERY    . CORONARY ANGIOPLASTY    . LEFT HEART CATHETERIZATION WITH CORONARY ANGIOGRAM N/A 05/10/2013   Procedure: LEFT HEART CATHETERIZATION WITH CORONARY ANGIOGRAM;  Surgeon: Lennette Bihari, MD;  Location: [facility] CATH LAB;  Service: Cardiovascular;  Laterality: N/A;  . PACEMAKER PLACEMENT  08/11/2010   Medtr[employer] Adapta   . STRESS PERFUSION TEST  10/22/2009  MILD ISCHEMIA IN THE BASAL ANTEROLATERAL AND MID ANTEROLATERAL REGIONS.EF 52%.  . TONSILLECTOMY    . TRANSTHORACIC ECHOCARDIOGRAM  01/17/2009   EF=>55%.IMPAIRED LV RELAXATION.LA IS MODERATELY DILATED.RA IS MILDLY DILATED. RV SYSTOLIC PRESSURE IS ELEVATED AT 30-40MMHG.MILD TO MODERATE TR.MILD MR.SINCE PRIOR TTE,BOTH ATRIA APPEAR TO BE MORE DILATED.    OB History    Gravida Para Term Preterm AB Living   3 3       3    SAB TAB Ectopic Multiple Live  Births                   Home Medications    Prior to Admission medications   Medication Sig Start Date End Date Taking? Authorizing Provider  albuterol (PROVENTIL HFA;VENTOLIN HFA) 108 (90 Base) MCG/ACT inhaler Inhale 1-2 puffs into the lungs every 6 (six) hours as needed for wheezing or shortness of breath. 03/04/16   Richarda OverlieNayana Abrol, MD  ALPRAZolam (XANAX) 0.25 MG tablet Take 1 tablet (0.25 mg total) by mouth 2 (two) times daily. 03/04/16   Richarda OverlieNayana Abrol, MD  benzonatate (TESSALON) 100 MG capsule Take 1 capsule (100 mg total) by mouth 3 (three) times daily as needed for cough. Patient not taking: Reported on 09/28/2016 02/27/16   Audry Piliyler Mohr, PA-C  budesonide-formoterol [facility](SYMBICORT) 160-4.5 MCG/ACT inhaler Inhale 2 puffs into the lungs 2 (two) times daily. 09/28/16   Tammy S Parrett, NP  dicyclomine (BENTYL) 10 MG capsule 1 capsule by mouth twice daily as needed for abdominal pain or cramps. 05/31/16   Charlie PitterHenry L Danis III, MD  digoxin (DIGOX) 0.125 MG tablet Take 1 tablet (125 mcg total) by mouth daily. KEEP OV. 10/25/16   Lennette Biharihomas A Kelly, MD  guaiFENesin-dextromethorphan (ROBITUSSIN DM) 100-10 MG/5ML syrup Take 10 mLs by mouth every 6 (six) hours. 03/04/16   Richarda OverlieNayana Abrol, MD  isosorbide mononitrate (IMDUR) 30 MG 24 hr tablet Take 1 tablet (30 mg total) by mouth daily. 08/06/16   Mihai Croitoru, MD  levothyroxine (SYNTHROID, LEVOTHROID) 100 MCG tablet TAKE 1 TABLET BY MOUTH EVERY DAY 04/08/15   Malissa HippoNajeeb U Rehman, MD  loperamide (IMODIUM) 2 MG capsule Take 2 mg by mouth as needed for diarrhea or loose stools.    Historical Provider, MD  metoprolol (LOPRESSOR) 50 MG tablet Take 1 tablet (50 mg total) by mouth 2 (two) times daily. KEEP OV. 03/08/16   Lennette Biharihomas A Kelly, MD  NONFORMULARY OR COMPOUNDED ITEM Estradiol 0.02 % 1ml prefilled applicator Sig: apply twice a week Patient not taking: Reported on 09/28/2016 03/15/16   Ok EdwardsJuan H Fernandez, MD  traMADol (ULTRAM) 50 MG tablet Take 25-50 mg by mouth every 6 (six) hours  as needed for pain.     Historical Provider, MD  warfarin (COUMADIN) 2.5 MG tablet TAKE 1 TABLET BY MOUTH DAILY AS DIRECTED BY COUMADIN CLINIC Patient taking differently: TAKES 1/2 TAB BY MOUTH IN EVENINGS ALL DAYS OF WEEK 07/04/15   Rollene RotundaJames Hochrein, MD    Family History Family History  Problem Relation Age of Onset  . Heart disease Father   . Cancer Mother     Social History Social History  Substance Use Topics  . Smoking status: Never Smoker  . Smokeless tobacco: Never Used  . Alcohol use No     Allergies   Iohexol; Penicillins; and Sulfa antibiotics   Review of Systems Review of Systems  Constitutional: Negative for fever.  HENT: Negative for congestion.   Respiratory: Negative for cough and shortness of breath.   Cardiovascular: Negative for chest pain.  Gastrointestinal: Positive for abdominal pain, constipation, diarrhea and flatus. Negative for melena, nausea and vomiting.  Genitourinary: Negative for dysuria and hematuria.  Musculoskeletal: Negative for arthralgias.  All other systems reviewed and are negative.    Physical Exam Updated Vital Signs BP 161/82   Pulse 81   Temp 98.1 F (36.7 C) (Oral)   Resp 16   Ht 5\' 5"  (1.651 m)   Wt 164 lb (74.4 kg)   SpO2 100%   BMI 27.29 kg/m   Physical Exam  Constitutional: She appears well-developed and well-nourished.  HENT:  Head: Normocephalic and atraumatic.  Eyes: Conjunctivae and EOM are normal.  Neck: Normal range of motion.  Cardiovascular: Normal rate and regular rhythm.   Pulmonary/Chest: No stridor. No respiratory distress.  Abdominal: She exhibits distension (with tympany). There is tenderness (generalized but worse in LLQ ). No hernia.  Neurological: She is alert.  Skin: Skin is warm and dry. No erythema. No pallor.  Nursing note and vitals reviewed.    ED Treatments / Results  Labs (all labs ordered are listed, but only abnormal results are displayed) Labs Reviewed  COMPREHENSIVE METABOLIC  PANEL - Abnormal; Notable for the following:       Result Value   Glucose, Bld 100 (*)    All other components within normal limits  CBC - Abnormal; Notable for the following:    WBC 11.2 (*)    All other components within normal limits  LIPASE, BLOOD  URINALYSIS, ROUTINE W REFLEX MICROSCOPIC (NOT AT [facility])    EKG  EKG Interpretation None       Radiology No results found.  Procedures Procedures (including critical care time)  Medications Ordered in ED Medications  metroNIDAZOLE (FLAGYL) tablet 500 mg (500 mg Oral Given 10/30/16 0148)     Initial Impression / Assessment and Plan / ED Course  I have reviewed the triage vital signs and the nursing notes.  Pertinent labs & imaging results that were available during my care of the patient were reviewed by me and considered in my medical decision making (see chart for details).  Clinical Course     82 year old female with abdominal pain and distention. Mild tenderness to palpation diffusely. She had a couple of episodes of diarrhea but has recently been constipated. Pain is improved with passing gas. Exam as above. CT scan ordered to evaluate for obstruction versus perforation versus diverticulitis. If this is normal patient can likely be discharged on supportive care.  Final Clinical Impressions(s) / ED Diagnoses   Final diagnoses:  Diverticulitis of large intestine without perforation or abscess without bleeding    New Prescriptions New Prescriptions   No medications on file     Marily Memos, MD 10/30/16 1537

## 2016-10-29 NOTE — ED Notes (Signed)
EDP at bedside  

## 2016-10-29 NOTE — ED Notes (Signed)
Pt taken to CT.

## 2016-10-30 MED ORDER — CIPROFLOXACIN HCL 500 MG PO TABS
ORAL_TABLET | ORAL | Status: AC
  Filled 2016-10-30: qty 1

## 2016-10-30 MED ORDER — CIPROFLOXACIN HCL 500 MG PO TABS
500.0000 mg | ORAL_TABLET | Freq: Two times a day (BID) | ORAL | Status: DC
  Administered 2016-10-30: 500 mg via ORAL

## 2016-10-30 MED ORDER — METRONIDAZOLE 500 MG PO TABS: 500.0000 mg | ORAL_TABLET | Freq: Two times a day (BID) | ORAL | 0 refills | Status: DC

## 2016-10-30 MED ORDER — ONDANSETRON HCL 4 MG PO TABS: 4.0000 mg | ORAL_TABLET | Freq: Four times a day (QID) | ORAL | 0 refills | Status: DC

## 2016-10-30 MED ORDER — CIPROFLOXACIN HCL 500 MG PO TABS: 500.0000 mg | ORAL_TABLET | Freq: Two times a day (BID) | ORAL | 0 refills | Status: DC

## 2016-10-30 MED ORDER — METRONIDAZOLE 500 MG PO TABS
ORAL_TABLET | ORAL | Status: AC
  Filled 2016-10-30: qty 1

## 2016-10-30 MED ORDER — METRONIDAZOLE 500 MG PO TABS
500.0000 mg | ORAL_TABLET | Freq: Once | ORAL | Status: AC
  Administered 2016-10-30: 500 mg via ORAL

## 2016-10-30 NOTE — ED Provider Notes (Signed)
Pt sign out by Dr. Clayborne Dana  CT abd/pelv shows some infectious process, mild, with underlying diverticulosis. Discussed with Dr. Manus Gunning, recommends treat as Diverticulitis and start on Cipro/Flagyl, pt given option of admission, feels comfortable going home.  Medications  Barium Sulfate 2.1 % SUSP (not administered)  ciprofloxacin (CIPRO) tablet 500 mg (not administered)  metroNIDAZOLE (FLAGYL) tablet 500 mg (not administered)    I discussed results, diagnoses and plan with [name]. They voice there understanding and questions were answered. We discussed follow-up recommendations and return precautions.    Leslie Pel, PA-C 10/30/16 1735    Glynn Octave, MD 10/30/16 [phone]

## 2016-12-08 ENCOUNTER — Other Ambulatory Visit (HOSPITAL_COMMUNITY): Payer: Self-pay | Admitting: Family Medicine

## 2016-12-08 DIAGNOSIS — K580 Irritable bowel syndrome with diarrhea: Secondary | ICD-10-CM

## 2016-12-08 DIAGNOSIS — R159 Full incontinence of feces: Secondary | ICD-10-CM

## 2016-12-09 ENCOUNTER — Ambulatory Visit (HOSPITAL_COMMUNITY)
Admission: RE | Admit: 2016-12-09 | Discharge: 2016-12-09 | Disposition: A | Discharge status: Home / Self Care | Payer: Medicare HMO | Source: Ambulatory Visit | Attending: Family Medicine | Admitting: Family Medicine

## 2016-12-09 ENCOUNTER — Encounter (HOSPITAL_COMMUNITY): Payer: Self-pay

## 2016-12-09 DIAGNOSIS — K5730 Diverticulosis of large intestine without perforation or abscess without bleeding: Secondary | ICD-10-CM | POA: Insufficient documentation

## 2016-12-09 DIAGNOSIS — N8189 Other female genital prolapse: Secondary | ICD-10-CM | POA: Diagnosis not present

## 2016-12-09 DIAGNOSIS — K580 Irritable bowel syndrome with diarrhea: Secondary | ICD-10-CM | POA: Diagnosis present

## 2016-12-09 DIAGNOSIS — R159 Full incontinence of feces: Secondary | ICD-10-CM

## 2016-12-14 ENCOUNTER — Encounter: Payer: Medicare HMO | Admitting: *Deleted

## 2016-12-14 ENCOUNTER — Telehealth: Payer: Self-pay | Admitting: Cardiology

## 2016-12-14 NOTE — Telephone Encounter (Signed)
LMOVM reminding pt to send remote transmission.   

## 2016-12-17 ENCOUNTER — Encounter: Payer: Self-pay | Admitting: Cardiology

## 2017-01-03 ENCOUNTER — Ambulatory Visit (INDEPENDENT_AMBULATORY_CARE_PROVIDER_SITE_OTHER): Payer: Medicare HMO | Admitting: *Deleted

## 2017-01-03 ENCOUNTER — Ambulatory Visit: Payer: Medicare HMO | Admitting: Pulmonary Disease

## 2017-01-03 DIAGNOSIS — I495 Sick sinus syndrome: Secondary | ICD-10-CM | POA: Diagnosis not present

## 2017-01-03 NOTE — Progress Notes (Signed)
Remote pacemaker transmission.   

## 2017-01-04 LAB — CUP PACEART REMOTE DEVICE CHECK
Battery Impedance: 2544 Ohm
Battery Voltage: 2.74 V
Implantable Lead Location: 753860
Lead Channel Impedance Value: 0 Ohm
Lead Channel Pacing Threshold Pulse Width: 0.4 ms
Lead Channel Setting Pacing Amplitude: 2.5 V
Lead Channel Setting Sensing Sensitivity: 2 mV
MDC IDC LEAD IMPLANT DT: 20111017
MDC IDC MSMT BATTERY REMAINING LONGEVITY: 31 mo
MDC IDC MSMT LEADCHNL RV IMPEDANCE VALUE: 682 Ohm
MDC IDC MSMT LEADCHNL RV PACING THRESHOLD AMPLITUDE: 0.375 V
MDC IDC PG IMPLANT DT: 20111017
MDC IDC SESS DTM: 20180205151850
MDC IDC SET LEADCHNL RV PACING PULSEWIDTH: 0.4 ms
MDC IDC STAT BRADY RV PERCENT PACED: 51 %

## 2017-01-05 ENCOUNTER — Telehealth: Payer: Self-pay | Admitting: Pulmonary Disease

## 2017-01-05 ENCOUNTER — Encounter: Payer: Self-pay | Admitting: Cardiology

## 2017-01-05 NOTE — Telephone Encounter (Signed)
Patient's husband calling back stating equipment she has been using is is through [facility].  CB is [phone].

## 2017-01-05 NOTE — Telephone Encounter (Signed)
Attempted to contact pt's husband, [name]. No answer and his voicemail is not set up. Will try back.

## 2017-01-05 NOTE — Telephone Encounter (Signed)
Spoke with pt's husband. States that their insurance carrier is not in network with [facility]. He was at the drug store and didn't have the information with him that he needed to relay to [place]. Pt's husband will call back with this information.

## 2017-01-05 NOTE — Telephone Encounter (Signed)
[phone] spouse calling back

## 2017-01-05 NOTE — Telephone Encounter (Signed)
Spoke with pt's husband. States that pt's oxygen will need to be transferred to [facility] from [facility]. Advised him that pt will need an appointment and to be requalified for oxygen since she is changing DME's. OV has been scheduled for 01/11/17 at 11:30am. Nothing further was needed.

## 2017-01-11 ENCOUNTER — Encounter: Payer: Self-pay | Admitting: Adult Health

## 2017-01-11 ENCOUNTER — Ambulatory Visit (INDEPENDENT_AMBULATORY_CARE_PROVIDER_SITE_OTHER): Payer: Medicare HMO | Admitting: Adult Health

## 2017-01-11 VITALS — BP 124/72 | HR 78 | Ht 69.0 in | Wt 166.2 lb

## 2017-01-11 DIAGNOSIS — J449 Chronic obstructive pulmonary disease, unspecified: Secondary | ICD-10-CM

## 2017-01-11 DIAGNOSIS — J9611 Chronic respiratory failure with hypoxia: Secondary | ICD-10-CM

## 2017-01-11 NOTE — Patient Instructions (Addendum)
Continue on  Symbicort 2 puffs Twice daily  , rinse after use.  Continue on Oxygen 2l/m with activity and At bedtime  .  Follow up Dr. Clayborn Bigness in 3 months and As needed   Please contact office for sooner follow up if symptoms do not improve or worsen or seek emergency care

## 2017-01-11 NOTE — Assessment & Plan Note (Signed)
Cont on O2 .  Change DME per insurance requirement .

## 2017-01-11 NOTE — Progress Notes (Signed)
@Patient  ID: [name], female    DOB: September 07, 1930, 83 y.o.   MRN: [id number]  Chief Complaint  Patient presents with  . Follow-up    Referring provider: Juliette Alcide, MD  HPI: 83 year old never smoker female followed for moderate COPD ?asthma  Patient was seen for pulmonary consult for pneumonia and COPD 03/08/2016 Second hand smoke and wood stove exposure PMH MIld Dementia   TEST  Pulmonary function test in May 2017 FEV1 of 70%, ratio 68, FVC 77%, minimum bronchodilator  response at 8%. Mid flows with a positive bronchodilator response. DLCO 33%.  01/11/2017 Follow up : Chronic Asthma /O2 RF  Patient presents for a four-month follow-up. Patient says overall that she has been doing okay with her breathing. Remains on Symbicort twice daily. She is accompanied by her husband .  No flare of cough or wheezing .   She remains on Oxygen 2l/m with activity and bedtime . She is changing DME due to insurance coverage. Need O2 qualification today ,  SATURATION QUALIFICATIONS: (This note is used to comply with regulatory documentation for home oxygen) Patient Saturations on Room Air at Rest = 97% Patient Saturations on Room Air while Ambulating = 87% Patient Saturations on 2 Liters of oxygen while Ambulating = 98% Feels the O2 helps   Under some stress, Husband has been sick, now recovering. Support provided.    Allergies  Allergen Reactions  . Iohexol Other (See Comments)     Desc: CHEST PAIN, LOC AFTER HEART CATH, PT REFUSED DYE   . Penicillins Other (See Comments)    Told not to take medication  . Sulfa Antibiotics Other (See Comments)    Told not to take med    Immunization History  Administered Date(s) Administered  . Influenza,inj,Quad PF,36+ Mos 11/06/2015, 09/28/2016    Past Medical History:  Diagnosis Date  . A-fib (HCC)   . Anxiety   . Arthritis   . Bronchitis 02/2016  . Coronary artery disease 12/2007  . Diabetes mellitus    x 10 yrs at least  .  History of stress test 08/2011   negative for ischemia it was nongated and nondiagnostic and compatible with a possible scar or peri-infraction ischemia.   Marland Kitchen Hx of echocardiogram 09/2012   showed Ef 35-40% with no significant valve disease except for mild to moderate MR. normal RVSP and left atrium was severly dilated.  . Hypertension   . Pacemaker   . Polio   . Stroke (HCC)   . Thyroid disease    hypothyroidism    Tobacco History: History  Smoking Status  . Never Smoker  Smokeless Tobacco  . Never Used   Counseling given: Not Answered   Outpatient Encounter Prescriptions as of 01/11/2017  Medication Sig  . albuterol (PROVENTIL HFA;VENTOLIN HFA) 108 (90 Base) MCG/ACT inhaler Inhale 1-2 puffs into the lungs every 6 (six) hours as needed for wheezing or shortness of breath.  . ALPRAZolam (XANAX) 0.25 MG tablet Take 1 tablet (0.25 mg total) by mouth 2 (two) times daily.  . budesonide-formoterol (SYMBICORT) 160-4.5 MCG/ACT inhaler Inhale 2 puffs into the lungs 2 (two) times daily.  Marland Kitchen dicyclomine (BENTYL) 10 MG capsule 1 capsule by mouth twice daily as needed for abdominal pain or cramps.  . digoxin (DIGOX) 0.125 MG tablet Take 1 tablet (125 mcg total) by mouth daily. KEEP OV.  Marland Kitchen guaiFENesin-dextromethorphan (ROBITUSSIN DM) 100-10 MG/5ML syrup Take 10 mLs by mouth every 6 (six) hours.  . isosorbide mononitrate (IMDUR) 30 MG  24 hr tablet Take 1 tablet (30 mg total) by mouth daily.  Marland Kitchen levothyroxine (SYNTHROID, LEVOTHROID) 100 MCG tablet TAKE 1 TABLET BY MOUTH EVERY DAY  . loperamide (IMODIUM) 2 MG capsule Take 2 mg by mouth as needed for diarrhea or loose stools.  . metoprolol (LOPRESSOR) 50 MG tablet Take 1 tablet (50 mg total) by mouth 2 (two) times daily. KEEP OV.  . NONFORMULARY OR COMPOUNDED ITEM Estradiol 0.02 % 1ml prefilled applicator Sig: apply twice a week  . ondansetron (ZOFRAN) 4 MG tablet Take 1 tablet (4 mg total) by mouth every 6 (six) hours.  . traMADol (ULTRAM) 50 MG  tablet Take 25-50 mg by mouth every 6 (six) hours as needed for pain.   Marland Kitchen warfarin (COUMADIN) 2.5 MG tablet TAKE 1 TABLET BY MOUTH DAILY AS DIRECTED BY COUMADIN CLINIC (Patient taking differently: TAKES 1/2 TAB BY MOUTH IN EVENINGS ALL DAYS OF WEEK)  . benzonatate (TESSALON) 100 MG capsule Take 1 capsule (100 mg total) by mouth 3 (three) times daily as needed for cough. (Patient not taking: Reported on 01/11/2017)  . metroNIDAZOLE (FLAGYL) 500 MG tablet Take 1 tablet (500 mg total) by mouth 2 (two) times daily. (Patient not taking: Reported on 01/11/2017)  . [DISCONTINUED] ciprofloxacin (CIPRO) 500 MG tablet Take 1 tablet (500 mg total) by mouth 2 (two) times daily. (Patient not taking: Reported on 01/11/2017)   No facility-administered encounter medications on file as of 01/11/2017.      Review of Systems  Constitutional:   No  weight loss, night sweats,  Fevers, chills, + fatigue, or  lassitude.  HEENT:   No headaches,  Difficulty swallowing,  Tooth/dental problems, or  Sore throat,                No sneezing, itching, ear ache,  +nasal congestion, post nasal drip,   CV:  No chest pain,  Orthopnea, PND, swelling in lower extremities, anasarca, dizziness, palpitations, syncope.   GI  No heartburn, indigestion, abdominal pain, nausea, vomiting, diarrhea, change in bowel habits, loss of appetite, bloody stools.   Resp:    No wheezing.  No chest wall deformity  Skin: no rash or lesions.  GU: no dysuria, change in color of urine, no urgency or frequency.  No flank pain, no hematuria   MS:  No joint pain or swelling.  No decreased range of motion.  No back pain.    Physical Exam  BP 124/72 (BP Location: Right Arm, Cuff Size: Normal)   Pulse 78   SpO2 97%   GEN: A/Ox3; pleasant , NAD,elderly    HEENT:  [place]/AT,  EACs-clear, TMs-wnl, NOSE-clear, THROAT-clear, no lesions, no postnasal drip or exudate noted. Poor dentition   NECK:  Supple w/ fair ROM; no JVD; normal carotid impulses w/o  bruits; no thyromegaly or nodules palpated; no lymphadenopathy.    RESP  Clear  P & A; w/o, wheezes/ rales/ or rhonchi. no accessory muscle use, no dullness to percussion  CARD:  RRR, no m/r/g, no peripheral edema, pulses intact, no cyanosis or clubbing.  GI:   Soft & nt; nml bowel sounds; no organomegaly or masses detected.   Musco: Warm bil, no deformities or joint swelling noted.   Neuro: alert, no focal deficits noted.    Skin: Warm, no lesions or rashes  Psych:  No change in mood or affect. No depression or anxiety.  No memory loss.  Lab Results:  CBC    Component Value Date/Time   WBC 11.2 (H)  10/29/2016 1632   RBC 4.93 10/29/2016 1632   HGB 13.8 10/29/2016 1632   HCT 43.0 10/29/2016 1632   PLT 229 10/29/2016 1632   MCV 87.2 10/29/2016 1632   MCH 28.0 10/29/2016 1632   MCHC 32.1 10/29/2016 1632   RDW 14.7 10/29/2016 1632   LYMPHSABS 0.9 02/27/2016 1246   MONOABS 0.8 02/27/2016 1246   EOSABS 0.1 02/27/2016 1246   BASOSABS 0.0 02/27/2016 1246    BMET    Component Value Date/Time   NA 139 10/29/2016 1632   K 3.9 10/29/2016 1632   CL 102 10/29/2016 1632   CO2 30 10/29/2016 1632   GLUCOSE 100 (H) 10/29/2016 1632   BUN 20 10/29/2016 1632   CREATININE 0.66 10/29/2016 1632   CREATININE 0.58 02/14/2014 1502   CALCIUM 9.5 10/29/2016 1632   GFRNONAA >60 10/29/2016 1632   GFRAA >60 10/29/2016 1632    BNP    Component Value Date/Time   BNP 40.9 03/01/2016 2255    ProBNP No results found for: PROBNP  Imaging: No results found.   Assessment & Plan:   No problem-specific Assessment & Plan notes found for this encounter.     Rubye Oaks, NP 01/11/2017

## 2017-01-11 NOTE — Assessment & Plan Note (Signed)
Chronic obstructive Asthma in never smoker , - improved control   Plan  Patient Instructions  Continue on  Symbicort 2 puffs Twice daily  , rinse after use.  Continue on Oxygen 2l/m with activity and At bedtime  .  Follow up Dr. Clayborn Bigness in 3 months and As needed   Please contact office for sooner follow up if symptoms do not improve or worsen or seek emergency care

## 2017-01-13 ENCOUNTER — Telehealth: Payer: Self-pay | Admitting: Adult Health

## 2017-01-13 DIAGNOSIS — J449 Chronic obstructive pulmonary disease, unspecified: Secondary | ICD-10-CM

## 2017-01-13 DIAGNOSIS — J9611 Chronic respiratory failure with hypoxia: Secondary | ICD-10-CM

## 2017-01-13 NOTE — Telephone Encounter (Signed)
Spoke with Melissa and she stated they are not able to do the order for the oxygen for this pt. Due to the husband not wanting to set up payment arrangements. Lmom tcb x1 to pt.

## 2017-01-14 NOTE — Telephone Encounter (Signed)
That is fine w/ me

## 2017-01-14 NOTE — Telephone Encounter (Signed)
Patient husband [name] called regarding DME company - he can be reached at [phone] -pr

## 2017-01-14 NOTE — Telephone Encounter (Signed)
Called and spoke with pts husband [name] and he stated that he has had to do this before with RX helper and he had problems with this.  He never received his medications and did not want to take the chance doing this again with [facility].     He wants to be referred to another company that may not require such strict holds on the account.  TP please advise on who we can sent this to.  thanks

## 2017-01-14 NOTE — Telephone Encounter (Signed)
Order placed to change to a different DME company that will work with him about the payment.

## 2017-01-18 ENCOUNTER — Telehealth: Payer: Self-pay | Admitting: Cardiovascular Disease

## 2017-01-18 NOTE — Telephone Encounter (Signed)
Acknowledged.    Added [employer] to pharmacy list.

## 2017-01-18 NOTE — Telephone Encounter (Signed)
New message    Pt husband calling about their pharmacy changing. He states [name] will be contacting pt doctor who wrote prescriptions. He is calling to let the RN know.

## 2017-01-19 ENCOUNTER — Other Ambulatory Visit: Payer: Self-pay

## 2017-01-19 ENCOUNTER — Telehealth: Payer: Self-pay | Admitting: Adult Health

## 2017-01-19 MED ORDER — DIGOXIN 125 MCG PO TABS: 125.0000 ug | ORAL_TABLET | Freq: Every day | ORAL | 0 refills | Status: DC

## 2017-01-19 MED ORDER — METOPROLOL TARTRATE 50 MG PO TABS: ORAL_TABLET | ORAL | 0 refills | Status: DC

## 2017-01-19 MED ORDER — ISOSORBIDE MONONITRATE ER 30 MG PO TB24: 30.0000 mg | ORAL_TABLET | Freq: Every day | ORAL | 0 refills | Status: DC

## 2017-01-19 NOTE — Addendum Note (Signed)
Addended by: Neta Ehlers on: 01/19/2017 04:27 PM   Modules accepted: Orders

## 2017-01-19 NOTE — Telephone Encounter (Signed)
Rx(s) sent to pharmacy electronically.  

## 2017-01-19 NOTE — Telephone Encounter (Signed)
Spoke with pt's husband. He states that [facility] is the only DME in network with [employer]. They will have no choice but to go with [facility]. States that he will think more about this and call [facility] give them his decision. Nothing further was needed.

## 2017-01-21 ENCOUNTER — Telehealth: Payer: Self-pay | Admitting: Adult Health

## 2017-01-21 ENCOUNTER — Telehealth: Payer: Self-pay | Admitting: Pulmonary Disease

## 2017-01-21 DIAGNOSIS — R0602 Shortness of breath: Secondary | ICD-10-CM | POA: Diagnosis not present

## 2017-01-21 DIAGNOSIS — J449 Chronic obstructive pulmonary disease, unspecified: Secondary | ICD-10-CM

## 2017-01-21 DIAGNOSIS — J200 Acute bronchitis due to Mycoplasma pneumoniae: Secondary | ICD-10-CM | POA: Diagnosis not present

## 2017-01-21 NOTE — Telephone Encounter (Signed)
Called by [facility] d/t [facility] not delivering a portable oxygen concentrator. Call placed to [facility] who relates that they need an order faxed from the PCCM office which I can't do from [employer]. [facility] asked to call patient at [phone] and explain the situation and what needs to be done about it at this time. It appears that she did receive a stationary generator and oxygen tanks on this delivery. Therefore, her oxygen needs should be covered for the weekend.

## 2017-01-21 NOTE — Telephone Encounter (Signed)
Called and spoke with Barbara Cower at [facility] and he stated that they do have the order in for an eval to be done for the POC fit.  He stated that he will try and get this rushed for the pt.    I have called and lmomtcb x 1 for the pts husband.

## 2017-01-21 NOTE — Telephone Encounter (Signed)
Patient's husband, Dorinda Hill is calling stating [facility] delivered O2 tank today but it was a regular tank.  He states he specified that the patient cannot handle the heavy tank and he has had a heart attack and would be difficult for him to help her with.  He states he advised the company and [place] that the patient needs a portable oxygen concentrator that's like a big hand bag.  CB is [phone].

## 2017-01-21 NOTE — Telephone Encounter (Signed)
Called and [facility] for [name] with [facility] to advise to go ahead and contact the pt to set this up.

## 2017-01-24 NOTE — Addendum Note (Signed)
Addended by: Boone Master E on: 01/24/2017 09:27 AM   Modules accepted: Orders

## 2017-01-24 NOTE — Telephone Encounter (Signed)
Per TP: okay to order POC Order placed  Called spoke with pt's spouse [name] (pt has dementia) and informed him of the above [name] voiced his understanding Nothing further needed; will sign off

## 2017-01-25 ENCOUNTER — Other Ambulatory Visit: Payer: Self-pay | Admitting: Cardiovascular Disease

## 2017-01-25 DIAGNOSIS — I119 Hypertensive heart disease without heart failure: Secondary | ICD-10-CM | POA: Diagnosis not present

## 2017-01-25 DIAGNOSIS — I482 Chronic atrial fibrillation: Secondary | ICD-10-CM | POA: Diagnosis not present

## 2017-01-25 DIAGNOSIS — K219 Gastro-esophageal reflux disease without esophagitis: Secondary | ICD-10-CM | POA: Diagnosis not present

## 2017-01-25 DIAGNOSIS — J449 Chronic obstructive pulmonary disease, unspecified: Secondary | ICD-10-CM | POA: Diagnosis not present

## 2017-01-25 DIAGNOSIS — E039 Hypothyroidism, unspecified: Secondary | ICD-10-CM | POA: Diagnosis not present

## 2017-01-25 DIAGNOSIS — Z7901 Long term (current) use of anticoagulants: Secondary | ICD-10-CM | POA: Diagnosis not present

## 2017-01-25 NOTE — Telephone Encounter (Signed)
Rx(s) sent to pharmacy electronically.  

## 2017-01-26 NOTE — Telephone Encounter (Signed)
lmomtcb x 2  

## 2017-01-27 NOTE — Telephone Encounter (Signed)
Pt husband states that [facility] is coming out to visit on Monday 01/31/17  Nothing further needed.

## 2017-01-28 DIAGNOSIS — F0280 Dementia in other diseases classified elsewhere without behavioral disturbance: Secondary | ICD-10-CM | POA: Diagnosis not present

## 2017-01-28 DIAGNOSIS — J449 Chronic obstructive pulmonary disease, unspecified: Secondary | ICD-10-CM | POA: Diagnosis not present

## 2017-01-28 DIAGNOSIS — F411 Generalized anxiety disorder: Secondary | ICD-10-CM | POA: Diagnosis not present

## 2017-01-28 DIAGNOSIS — I495 Sick sinus syndrome: Secondary | ICD-10-CM | POA: Diagnosis not present

## 2017-01-28 DIAGNOSIS — I2510 Atherosclerotic heart disease of native coronary artery without angina pectoris: Secondary | ICD-10-CM | POA: Diagnosis not present

## 2017-01-28 DIAGNOSIS — I119 Hypertensive heart disease without heart failure: Secondary | ICD-10-CM | POA: Diagnosis not present

## 2017-01-28 DIAGNOSIS — I482 Chronic atrial fibrillation: Secondary | ICD-10-CM | POA: Diagnosis not present

## 2017-01-28 DIAGNOSIS — K219 Gastro-esophageal reflux disease without esophagitis: Secondary | ICD-10-CM | POA: Diagnosis not present

## 2017-02-08 DIAGNOSIS — E039 Hypothyroidism, unspecified: Secondary | ICD-10-CM | POA: Diagnosis not present

## 2017-02-08 DIAGNOSIS — F411 Generalized anxiety disorder: Secondary | ICD-10-CM | POA: Diagnosis not present

## 2017-02-08 DIAGNOSIS — J449 Chronic obstructive pulmonary disease, unspecified: Secondary | ICD-10-CM | POA: Diagnosis not present

## 2017-02-08 DIAGNOSIS — I482 Chronic atrial fibrillation: Secondary | ICD-10-CM | POA: Diagnosis not present

## 2017-02-08 DIAGNOSIS — I2510 Atherosclerotic heart disease of native coronary artery without angina pectoris: Secondary | ICD-10-CM | POA: Diagnosis not present

## 2017-02-08 DIAGNOSIS — I119 Hypertensive heart disease without heart failure: Secondary | ICD-10-CM | POA: Diagnosis not present

## 2017-02-08 DIAGNOSIS — I495 Sick sinus syndrome: Secondary | ICD-10-CM | POA: Diagnosis not present

## 2017-02-08 DIAGNOSIS — F0280 Dementia in other diseases classified elsewhere without behavioral disturbance: Secondary | ICD-10-CM | POA: Diagnosis not present

## 2017-02-15 DIAGNOSIS — I482 Chronic atrial fibrillation: Secondary | ICD-10-CM | POA: Diagnosis not present

## 2017-02-18 DIAGNOSIS — J200 Acute bronchitis due to Mycoplasma pneumoniae: Secondary | ICD-10-CM | POA: Diagnosis not present

## 2017-02-18 DIAGNOSIS — R0602 Shortness of breath: Secondary | ICD-10-CM | POA: Diagnosis not present

## 2017-02-18 DIAGNOSIS — J449 Chronic obstructive pulmonary disease, unspecified: Secondary | ICD-10-CM | POA: Diagnosis not present

## 2017-03-05 DIAGNOSIS — I482 Chronic atrial fibrillation: Secondary | ICD-10-CM | POA: Diagnosis not present

## 2017-03-05 DIAGNOSIS — I2510 Atherosclerotic heart disease of native coronary artery without angina pectoris: Secondary | ICD-10-CM | POA: Diagnosis not present

## 2017-03-05 DIAGNOSIS — I119 Hypertensive heart disease without heart failure: Secondary | ICD-10-CM | POA: Diagnosis not present

## 2017-03-05 DIAGNOSIS — E039 Hypothyroidism, unspecified: Secondary | ICD-10-CM | POA: Diagnosis not present

## 2017-03-05 DIAGNOSIS — J9611 Chronic respiratory failure with hypoxia: Secondary | ICD-10-CM | POA: Diagnosis not present

## 2017-03-05 DIAGNOSIS — J449 Chronic obstructive pulmonary disease, unspecified: Secondary | ICD-10-CM | POA: Diagnosis not present

## 2017-03-05 DIAGNOSIS — J209 Acute bronchitis, unspecified: Secondary | ICD-10-CM | POA: Diagnosis not present

## 2017-03-05 DIAGNOSIS — F0280 Dementia in other diseases classified elsewhere without behavioral disturbance: Secondary | ICD-10-CM | POA: Diagnosis not present

## 2017-03-08 DIAGNOSIS — K5790 Diverticulosis of intestine, part unspecified, without perforation or abscess without bleeding: Secondary | ICD-10-CM | POA: Diagnosis not present

## 2017-03-08 DIAGNOSIS — Z955 Presence of coronary angioplasty implant and graft: Secondary | ICD-10-CM | POA: Diagnosis not present

## 2017-03-08 DIAGNOSIS — Z8673 Personal history of transient ischemic attack (TIA), and cerebral infarction without residual deficits: Secondary | ICD-10-CM | POA: Diagnosis not present

## 2017-03-08 DIAGNOSIS — J449 Chronic obstructive pulmonary disease, unspecified: Secondary | ICD-10-CM | POA: Diagnosis not present

## 2017-03-08 DIAGNOSIS — K589 Irritable bowel syndrome without diarrhea: Secondary | ICD-10-CM | POA: Diagnosis not present

## 2017-03-08 DIAGNOSIS — N816 Rectocele: Secondary | ICD-10-CM | POA: Diagnosis not present

## 2017-03-08 DIAGNOSIS — K5903 Drug induced constipation: Secondary | ICD-10-CM | POA: Diagnosis not present

## 2017-03-08 DIAGNOSIS — T476X5A Adverse effect of antidiarrheal drugs, initial encounter: Secondary | ICD-10-CM | POA: Diagnosis not present

## 2017-03-08 DIAGNOSIS — Z7901 Long term (current) use of anticoagulants: Secondary | ICD-10-CM | POA: Diagnosis not present

## 2017-03-08 DIAGNOSIS — Z79899 Other long term (current) drug therapy: Secondary | ICD-10-CM | POA: Diagnosis not present

## 2017-03-08 DIAGNOSIS — K580 Irritable bowel syndrome with diarrhea: Secondary | ICD-10-CM | POA: Diagnosis not present

## 2017-03-18 ENCOUNTER — Telehealth: Payer: Self-pay | Admitting: Adult Health

## 2017-03-18 NOTE — Telephone Encounter (Signed)
Spoke with Renato Gails at [facility], states she faxed a CMN for pt's 02 for completion.   Synetta Fail please advise if you've received this CMN or if it needs to be refaxed.  Thanks!

## 2017-03-21 DIAGNOSIS — R0602 Shortness of breath: Secondary | ICD-10-CM | POA: Diagnosis not present

## 2017-03-21 DIAGNOSIS — I482 Chronic atrial fibrillation: Secondary | ICD-10-CM | POA: Diagnosis not present

## 2017-03-21 DIAGNOSIS — J449 Chronic obstructive pulmonary disease, unspecified: Secondary | ICD-10-CM | POA: Diagnosis not present

## 2017-03-21 DIAGNOSIS — J200 Acute bronchitis due to Mycoplasma pneumoniae: Secondary | ICD-10-CM | POA: Diagnosis not present

## 2017-03-21 NOTE — Telephone Encounter (Signed)
I called Deana with [facility] and left her a message to call me I have faxed everything that TP has signed and there are 2 different forms scanned into the patients chart. Waiting on call back

## 2017-03-25 NOTE — Telephone Encounter (Signed)
Synetta Fail any updates on this message.  Can we close this message?  thanks

## 2017-03-29 DIAGNOSIS — Z1159 Encounter for screening for other viral diseases: Secondary | ICD-10-CM | POA: Diagnosis not present

## 2017-03-29 NOTE — Telephone Encounter (Signed)
I have tried calling this [name] person back and have left 2 messages for her to return my call. I then sent a staff message to [place], [facility] and ask if she knew who she was. We don't have a last name.

## 2017-03-30 ENCOUNTER — Telehealth: Payer: Self-pay | Admitting: Adult Health

## 2017-03-30 NOTE — Telephone Encounter (Signed)
lmtcb X1 for pt's husband  TP please advise if you've been trying to contact pt/spouse- I see no documentation on this.  Thanks!

## 2017-03-30 NOTE — Telephone Encounter (Signed)
I have not called, are they having any issues.  See if they have follow up set up from last ov , seen last in Feb .

## 2017-03-31 ENCOUNTER — Telehealth: Payer: Self-pay | Admitting: Adult Health

## 2017-03-31 NOTE — Telephone Encounter (Signed)
Called and spoke to pt's husband, Dorinda Hill. He states he was informed by insurance and [facility] that we are needing to finalize some part of pt's O2 order for order to be processed. Called Melissa with [facility] and was advised to send her a staff message and she will look into it and respond. I will update chart with Melissa's response.

## 2017-03-31 NOTE — Telephone Encounter (Signed)
Pt is scheduled to see AD on 5/17. lmtcb X2 for pt's husband.

## 2017-03-31 NOTE — Telephone Encounter (Signed)
Error..[name] ° °

## 2017-03-31 NOTE — Telephone Encounter (Signed)
Patient returned phone call. °

## 2017-04-01 NOTE — Telephone Encounter (Signed)
No I haven't heard from anyone

## 2017-04-01 NOTE — Telephone Encounter (Signed)
Synetta Fail any update on this message?  Thanks

## 2017-04-04 NOTE — Telephone Encounter (Signed)
Spoke with [facility] and they stated they will refax to [place]

## 2017-04-05 DIAGNOSIS — I8390 Asymptomatic varicose veins of unspecified lower extremity: Secondary | ICD-10-CM | POA: Diagnosis not present

## 2017-04-05 DIAGNOSIS — I80291 Phlebitis and thrombophlebitis of other deep vessels of right lower extremity: Secondary | ICD-10-CM | POA: Diagnosis not present

## 2017-04-05 DIAGNOSIS — Z6823 Body mass index (BMI) 23.0-23.9, adult: Secondary | ICD-10-CM | POA: Diagnosis not present

## 2017-04-05 NOTE — Telephone Encounter (Signed)
Synetta Fail please advise if you received CMN faxed yesterday, or if this needs to be re-requested.  Thanks!

## 2017-04-05 NOTE — Telephone Encounter (Signed)
Copied from Elise's inbasket:  Angelique Blonder, RN        I'm not seeing any issues in this pt account. Everything looks to be in order. Thanks!    lmtcb for pt's spouse to make aware.  wcb.

## 2017-04-05 NOTE — Telephone Encounter (Signed)
We did get the new CMN and when I looked at the 2 that were already scanned in they all were the same so I printed and faxed all 3 reports back to [facility] and I even got Melissa, [facility] to look behind me just to see if there was a difference in the 3 CMN's and between the 2 of [place] we didn't see any difference at all. As far as I am concerned this issue has been resolved

## 2017-04-06 NOTE — Telephone Encounter (Signed)
lmomtcb x 2  

## 2017-04-07 ENCOUNTER — Telehealth: Payer: Self-pay | Admitting: Adult Health

## 2017-04-07 NOTE — Telephone Encounter (Signed)
She has ov with Dr. Christene Slates next week can look at ov to see if no desats and decide on ONO if needed.

## 2017-04-07 NOTE — Telephone Encounter (Signed)
According to phone note from 03/30/17 TP wanted pt to return to see AD to discuss if she still needs to have her oxygen. Husband states he will call us back if we need anything else.

## 2017-04-07 NOTE — Telephone Encounter (Signed)
lmomtcb x1 

## 2017-04-07 NOTE — Telephone Encounter (Signed)
Pt husband is aware of [facility] information.   Husband states that he is questioning if the patient needs the O2 any longer.  Husband states that she was advised to wear it at bedtime and PRN daytime. Pt has not been using O2 for about 3 weeks. Pt not wearing at night. O2 readings have been reading 93%-98%  Please advise TP if you want to test to d/c O2. Thanks.

## 2017-04-08 NOTE — Telephone Encounter (Signed)
Spoke with Nancee Liter that [employer] will help make decision to continue O2 or not at next week's visit. Nothing more needed at this time.

## 2017-04-08 NOTE — Telephone Encounter (Signed)
Please see previous phone note regarding this matter.

## 2017-04-10 ENCOUNTER — Encounter: Payer: Self-pay | Admitting: Emergency Medicine

## 2017-04-10 ENCOUNTER — Emergency Department
Admission: EM | Admit: 2017-04-10 | Discharge: 2017-04-11 | Disposition: A | Discharge status: Home / Self Care | Payer: Medicare HMO | Attending: Emergency Medicine | Admitting: Emergency Medicine

## 2017-04-10 DIAGNOSIS — J45909 Unspecified asthma, uncomplicated: Secondary | ICD-10-CM | POA: Diagnosis not present

## 2017-04-10 DIAGNOSIS — Z8673 Personal history of transient ischemic attack (TIA), and cerebral infarction without residual deficits: Secondary | ICD-10-CM | POA: Insufficient documentation

## 2017-04-10 DIAGNOSIS — R197 Diarrhea, unspecified: Secondary | ICD-10-CM | POA: Insufficient documentation

## 2017-04-10 DIAGNOSIS — I2510 Atherosclerotic heart disease of native coronary artery without angina pectoris: Secondary | ICD-10-CM | POA: Insufficient documentation

## 2017-04-10 DIAGNOSIS — R109 Unspecified abdominal pain: Secondary | ICD-10-CM | POA: Diagnosis present

## 2017-04-10 DIAGNOSIS — I10 Essential (primary) hypertension: Secondary | ICD-10-CM | POA: Diagnosis not present

## 2017-04-10 DIAGNOSIS — J449 Chronic obstructive pulmonary disease, unspecified: Secondary | ICD-10-CM | POA: Insufficient documentation

## 2017-04-10 DIAGNOSIS — Z79899 Other long term (current) drug therapy: Secondary | ICD-10-CM | POA: Diagnosis not present

## 2017-04-10 DIAGNOSIS — K6289 Other specified diseases of anus and rectum: Secondary | ICD-10-CM | POA: Insufficient documentation

## 2017-04-10 DIAGNOSIS — E119 Type 2 diabetes mellitus without complications: Secondary | ICD-10-CM | POA: Insufficient documentation

## 2017-04-10 LAB — COMPREHENSIVE METABOLIC PANEL
ALT: 15 U/L (ref 14–54)
AST: 20 U/L (ref 15–41)
Albumin: 4.1 g/dL (ref 3.5–5.0)
Alkaline Phosphatase: 61 U/L (ref 38–126)
Anion gap: 8 (ref 5–15)
BILIRUBIN TOTAL: 1.6 mg/dL — AB (ref 0.3–1.2)
BUN: 24 mg/dL — AB (ref 6–20)
CALCIUM: 9.5 mg/dL (ref 8.9–10.3)
CO2: 28 mmol/L (ref 22–32)
CREATININE: 0.7 mg/dL (ref 0.44–1.00)
Chloride: 104 mmol/L (ref 101–111)
GFR calc Af Amer: >60 mL/min (ref >60)
Glucose, Bld: 116 mg/dL — ABNORMAL HIGH (ref 65–99)
Potassium: 3.4 mmol/L — ABNORMAL LOW (ref 3.5–5.1)
Sodium: 140 mmol/L (ref 135–145)
TOTAL PROTEIN: 6.7 g/dL (ref 6.5–8.1)

## 2017-04-10 LAB — CBC
HCT: 41.2 % (ref 35.0–47.0)
Hemoglobin: 13.5 g/dL (ref 12.0–16.0)
MCH: 28.1 pg (ref 26.0–34.0)
MCHC: 32.7 g/dL (ref 32.0–36.0)
MCV: 86 fL (ref 80.0–100.0)
Platelets: 178 10*3/uL (ref 150–440)
RBC: 4.79 MIL/uL (ref 3.80–5.20)
RDW: 15.7 % — ABNORMAL HIGH (ref 11.5–14.5)
WBC: 8.8 10*3/uL (ref 3.6–11.0)

## 2017-04-10 LAB — URINALYSIS, COMPLETE (UACMP) WITH MICROSCOPIC
BACTERIA UA: NONE SEEN
BILIRUBIN URINE: NEGATIVE
Glucose, UA: NEGATIVE mg/dL
KETONES UR: NEGATIVE mg/dL
Leukocytes, UA: NEGATIVE
Nitrite: NEGATIVE
Protein, ur: 30 mg/dL — AB
Specific Gravity, Urine: 1.026 (ref 1.005–1.030)
pH: 5 (ref 5.0–8.0)

## 2017-04-10 LAB — LIPASE, BLOOD: LIPASE: 17 U/L (ref 11–51)

## 2017-04-10 MED ORDER — BISACODYL 10 MG RE SUPP: 10.0000 mg | RECTAL | 0 refills | Status: DC | PRN

## 2017-04-10 NOTE — Discharge Instructions (Signed)
When you are having diarrhea, take 1 tablet of Immodium.  When you are feeling constipated, take 1 Dulcolax suppository.

## 2017-04-10 NOTE — ED Provider Notes (Signed)
[facility] Emergency Department Provider Note  ____________________________________________  Time seen: Approximately 11:05 PM  I have reviewed the triage vital signs and the nursing notes.   HISTORY  Chief Complaint Abdominal Pain    HPI [name] is a 83 y.o. female with a history of dementia who reports that she's been having difficulty with her bowel movements recently. She has a history of IBS and is supposed to take Imodium 1 tablet when she is having diarrhea. However she is not taking the Imodium and has been having diarrhea. She feels like she might of had some rectal bleeding as well recently but also ate a lot of red Jell-O.  Denies abdominal pain. No other complaints. Eating and drinking normally. She reports she frequently tries to digitally disimpact herself when she feels constipated.     Past Medical History:  Diagnosis Date  . A-fib (HCC)   . Anxiety   . Arthritis   . Bronchitis 02/2016  . Coronary artery disease 12/2007  . Diabetes mellitus    x 10 yrs at least  . History of stress test 08/2011   negative for ischemia it was nongated and nondiagnostic and compatible with a possible scar or peri-infraction ischemia.   Marland Kitchen Hx of echocardiogram 09/2012   showed Ef 35-40% with no significant valve disease except for mild to moderate MR. normal RVSP and left atrium was severly dilated.  . Hypertension   . Pacemaker   . Polio   . Stroke (HCC)   . Thyroid disease    hypothyroidism     Patient Active Problem List   Diagnosis Date Noted  . Asthma 04/19/2016  . Chronic respiratory failure (HCC) 04/19/2016  . COPD (chronic obstructive pulmonary disease) (HCC) 03/08/2016  . Hypoxemia 03/08/2016  . Acute bronchitis 03/02/2016  . Bronchitis 03/01/2016  . Long term (current) use of anticoagulants [Z79.01] 01/27/2016  . Prolapse of vaginal vault after hysterectomy 01/09/2016  . Atypical chest pain 06/03/2015  . Anxiety state  03/28/2015  . Chest pain 02/04/2015  . Hypertensive heart disease 05/12/2014  . Unspecified constipation 03/05/2014  . Abdominal pain, left lower quadrant 02/14/2014  . Abdominal pain, chronic, right lower quadrant 02/14/2014  . Change in stool 02/14/2014  . Encounter for therapeutic drug monitoring 01/07/2014  . Ataxia 05/11/2013  . Unstable angina (HCC) 05/08/2013  . SSS (sick sinus syndrome)- MDT PTVDP 9/11 05/08/2013  . Cardiomyopathy, suspected to be secondary to AF, not CAD 50-55% at cath June 2014 05/08/2013  . Situational stress 05/08/2013  . Long term current use of anticoagulant therapy 02/19/2013  . History of colonic diverticulitis 01/26/2013  . CAD, non DES to CFX '09, LAD BMS 7/11. Cath OK June 2014 01/23/2013  . Chronic atrial fibrillation (HCC) 01/23/2013  . GERD (gastroesophageal reflux disease) 01/23/2013  . Hypothyroidism 01/23/2013     Past Surgical History:  Procedure Laterality Date  . 3 cardiac stents     she has had non-DES stent to the mid circumflex in 2009 and LAD stent to the mid-distal LAD  BMS June 18, 2010  . ABDOMINAL HYSTERECTOMY  1978   TAH/BSO  . APPENDECTOMY    . CARDIAC SURGERY    . CORONARY ANGIOPLASTY    . LEFT HEART CATHETERIZATION WITH CORONARY ANGIOGRAM N/A 05/10/2013   Procedure: LEFT HEART CATHETERIZATION WITH CORONARY ANGIOGRAM;  Surgeon: Lennette Bihari, MD;  Location: [facility] CATH LAB;  Service: Cardiovascular;  Laterality: N/A;  . PACEMAKER PLACEMENT  08/11/2010   Medtronic Adapta   .  STRESS PERFUSION TEST  10/22/2009   MILD ISCHEMIA IN THE BASAL ANTEROLATERAL AND MID ANTEROLATERAL REGIONS.EF 52%.  . TONSILLECTOMY    . TRANSTHORACIC ECHOCARDIOGRAM  01/17/2009   EF=>55%.IMPAIRED LV RELAXATION.LA IS MODERATELY DILATED.RA IS MILDLY DILATED. RV SYSTOLIC PRESSURE IS ELEVATED AT 30-40MMHG.MILD TO MODERATE TR.MILD MR.SINCE PRIOR TTE,BOTH ATRIA APPEAR TO BE MORE DILATED.     Prior to Admission medications   Medication Sig Start Date End Date  Taking? Authorizing Provider  albuterol (PROVENTIL HFA;VENTOLIN HFA) 108 (90 Base) MCG/ACT inhaler Inhale 1-2 puffs into the lungs every 6 (six) hours as needed for wheezing or shortness of breath. 03/04/16   Abrol, Nayana, MD  ALPRAZolam (XANAX) 0.25 MG tablet Take 1 tablet (0.25 mg total) by mouth 2 (two) times daily. 03/04/16   Abrol, Nayana, MD  benzonatate (TESSALON) 100 MG capsule Take 1 capsule (100 mg total) by mouth 3 (three) times daily as needed for cough. Patient not taking: Reported on 01/11/2017 02/27/16   Mohr, Tyler, PA-C  bisacodyl (DULCOLAX) 10 MG suppository Place 1 suppository (10 mg total) rectally as needed for moderate constipation. 04/10/17   Aliani Caccavale, MD  budesonide-formoterol (SYMBICORT) 160-4.5 MCG/ACT inhaler Inhale 2 puffs into the lungs 2 (two) times daily. 09/28/16   Parrett, Tammy S, NP  dicyclomine (BENTYL) 10 MG capsule 1 capsule by mouth twice daily as needed for abdominal pain or cramps. 05/31/16   Danis, Henry L III, MD  digoxin (DIGOX) 0.125 MG tablet Take 1 tablet (125 mcg total) by mouth daily. Patient needs to call our office to schedule an appointment with Dr Kelly for future refills. 01/19/17   Kelly, Thomas A, MD  digoxin (DIGOX) 0.125 MG tablet Take 1 tablet (125 mcg total) by mouth daily. <PLEASE MAKE APPOINTMENT FOR REFILLS> 01/25/17   Kelly, Thomas A, MD  guaiFENesin-dextromethorphan (ROBITUSSIN DM) 100-10 MG/5ML syrup Take 10 mLs by mouth every 6 (six) hours. 03/04/16   Abrol, Nayana, MD  isosorbide mononitrate (IMDUR) 30 MG 24 hr tablet Take 1 tablet (30 mg total) by mouth daily. Patient needs to call our office to schedule an appointment with Dr Kelly for future refills. 01/19/17   Kelly, Thomas A, MD  levothyroxine (SYNTHROID, LEVOTHROID) 100 MCG tablet TAKE 1 TABLET BY MOUTH EVERY DAY 04/08/15   Rehman, Najeeb U, MD  loperamide (IMODIUM) 2 MG capsule Take 2 mg by mouth as needed for diarrhea or loose stools.    [provider]  metoprolol  (LOPRESSOR) 50 MG tablet Take 1 tablet (50 mg total) by mouth twice daily. Patient needs to call our office to schedule an appointment with Dr Kelly for refills. 01/19/17   Kelly, Thomas A, MD  metroNIDAZOLE (FLAGYL) 500 MG tablet Take 1 tablet (500 mg total) by mouth 2 (two) times daily. Patient not taking: Reported on 01/11/2017 10/30/16   Greene, Tiffany, PA-C  NONFORMULARY OR COMPOUNDED ITEM Estradiol 0ArviArviArviArviArviArvArviArviArviArviArviArviArviArviArvArviSharyn Creamerrnderson Cloud Sig: apply twice a week 03/15/16   Fernandez, Juan H, MD  ondansetron (ZOFRAN) 4 MG tablet Take 1 tablet (4 mg total) by mouth every 6 (six) hours. 10/30/16   Greene, Tiffany, PA-C  traMADol (ULTRAM) 50 MG tablet Take 25-50 mg by mouth every 6 (six) hours as needed for pain.     [provider]  warfarin (COUMADIN) 2.5 MG tablet TAKE 1 TABLET BY MOUTH DAILY AS DIRECTED BY COUMADIN CLINIC Patient taking differently: TAKES 1/2 TAB BY MOUTH IN EVENINGS ALL DAYS OF WEEK 07/04/15   Hochrein, James, MD  Allergies Iohexol; Penicillins; and Sulfa antibiotics   Family History  Problem Relation Age of Onset  . Heart disease Father   . Cancer Mother     Social History Social History  Substance Use Topics  . Smoking status: Never Smoker  . Smokeless tobacco: Never Used  . Alcohol use No    Review of Systems  Constitutional:   No fever or chills.  ENT:   No sore throat. No rhinorrhea. Lymphatic: No swollen glands, No extremity swelling Endocrine: No hot/cold flashes. No significant weight change. No neck swelling. Cardiovascular:   No chest pain or syncope. Respiratory:   No dyspnea or cough. Gastrointestinal:   Negative for abdominal pain, No vomiting. Positive diarrhea Genitourinary:   Negative for dysuria or difficulty urinating. Musculoskeletal:   Negative for focal pain or swelling Neurological:   Negative for headaches or weakness. All other systems reviewed and are negative except as documented above in ROS and  HPI.  ____________________________________________   PHYSICAL EXAM:  VITAL SIGNS: ED Triage Vitals [04/10/17 1948]  Enc Vitals Group     BP (!) 141/76     Pulse Rate 62     Resp 18     Temp 98 F (36.7 C)     Temp Source Oral     SpO2 96 %     Weight 160 lb (72.6 kg)     Height 5\' 9"  (1.753 m)     Head Circumference      Peak Flow      Pain Score 4     Pain Loc      Pain Edu?      Excl. in GC?     Vital signs reviewed, nursing assessments reviewed.   Constitutional:   Alert and orientedTo person and place. Well appearing and in no distress. Eyes:   No scleral icterus. No conjunctival pallor. PERRL. EOMI.  No nystagmus. ENT   Head:   Normocephalic and atraumatic.   Nose:   No congestion/rhinnorhea. No septal hematoma   Mouth/Throat:   MMM, no pharyngeal erythema. No peritonsillar mass.    Neck:   No stridor. No SubQ emphysema. No meningismus. Hematological/Lymphatic/Immunilogical:   No cervical lymphadenopathy. Cardiovascular:   RRR. Symmetric bilateral radial and DP pulses.  No murmurs.  Respiratory:   Normal respiratory effort without tachypnea nor retractions. Breath sounds are clear and equal bilaterally. No wheezes/rales/rhonchi. Gastrointestinal:   Soft and nontender. Non distended. There is no CVA tenderness.  No rebound, rigidity, or guarding. Rectal exam performed with Scheryl Darter bedside. Small external hemorrhoid that is not thrombosed inflamed or bleeding. Brown stool, Hemoccult negative, controls okay. No significant stool in vault. No impaction. There is stage I skin breakdown in the perirectal area. Genitourinary:   deferred Musculoskeletal:   Normal range of motion in all extremities. No joint effusions.  No lower extremity tenderness.  No edema. Lower extremities symmetric and nontender. Neurologic:   Normal speech and language.  CN 2-10 normal. Motor grossly intact. No gross focal neurologic deficits are appreciated.  Skin:    Skin is warm,  dry and intact. No rash noted.  No petechiae, purpura, or bullae.  ____________________________________________    LABS (pertinent positives/negatives) (all labs ordered are listed, but only abnormal results are displayed) Labs Reviewed  COMPREHENSIVE METABOLIC PANEL - Abnormal; Notable for the following:       Result Value   Potassium 3.4 (*)    Glucose, Bld 116 (*)    BUN 24 (*)  Total Bilirubin 1.6 (*)    All other components within normal limits  CBC - Abnormal; Notable for the following:    RDW 15.7 (*)    All other components within normal limits  URINALYSIS, COMPLETE (UACMP) WITH MICROSCOPIC - Abnormal; Notable for the following:    Color, Urine YELLOW (*)    APPearance CLOUDY (*)    Hgb urine dipstick SMALL (*)    Protein, ur 30 (*)    Squamous Epithelial / LPF 0-5 (*)    All other components within normal limits  LIPASE, BLOOD   ____________________________________________   EKG    ____________________________________________    RADIOLOGY  No results found.  ____________________________________________   PROCEDURES Procedures  ____________________________________________   INITIAL IMPRESSION / ASSESSMENT AND PLAN / ED COURSE  Pertinent labs & imaging results that were available during my care of the patient were reviewed by me and considered in my medical decision making (see chart for details).  Patient well appearing no acute distress. Complains of diarrhea consistent with her IBS. Recommended to follow her previous doctor's advice and take Imodium when she has a symptoms. I also recommended she not try to disimpact herself when she feels constipated and I prescribed her a Dulcolax suppository to try when she is feeling those symptoms from her IBS.Considering the patient's symptoms, medical history, and physical examination today, I have low suspicion for cholecystitis or biliary pathology, pancreatitis, perforation or bowel obstruction, hernia,  intra-abdominal abscess, AAA or dissection, volvulus or intussusception, mesenteric ischemia, or appendicitis.           ____________________________________________   FINAL CLINICAL IMPRESSION(S) / ED DIAGNOSES  Final diagnoses:  Rectal pain  Diarrhea, unspecified type      New Prescriptions   BISACODYL (DULCOLAX) 10 MG SUPPOSITORY    Place 1 suppository (10 mg total) rectally as needed for moderate constipation.     Portions of this note were generated with dragon dictation software. Dictation errors may occur despite best attempts at proofreading.    Sharman Cheek, MD 04/10/17 [phone]

## 2017-04-10 NOTE — ED Triage Notes (Signed)
Pt arrives ambulatory to triage with c/o lower abdominal pain. Pt states that she is having difficulty urinating and having BM. Pt is at [facility] in the memory care ward and is escorted here by her husband. Pt is talkative in triage and in NAD at this time.

## 2017-04-11 DIAGNOSIS — F419 Anxiety disorder, unspecified: Secondary | ICD-10-CM | POA: Diagnosis not present

## 2017-04-11 DIAGNOSIS — Z820 Family history of epilepsy and other diseases of the nervous system: Secondary | ICD-10-CM | POA: Diagnosis not present

## 2017-04-11 DIAGNOSIS — I10 Essential (primary) hypertension: Secondary | ICD-10-CM | POA: Diagnosis not present

## 2017-04-11 DIAGNOSIS — I495 Sick sinus syndrome: Secondary | ICD-10-CM | POA: Diagnosis not present

## 2017-04-11 DIAGNOSIS — E039 Hypothyroidism, unspecified: Secondary | ICD-10-CM | POA: Diagnosis not present

## 2017-04-11 DIAGNOSIS — K580 Irritable bowel syndrome with diarrhea: Secondary | ICD-10-CM | POA: Diagnosis not present

## 2017-04-11 DIAGNOSIS — I480 Paroxysmal atrial fibrillation: Secondary | ICD-10-CM | POA: Diagnosis not present

## 2017-04-11 DIAGNOSIS — J449 Chronic obstructive pulmonary disease, unspecified: Secondary | ICD-10-CM | POA: Diagnosis not present

## 2017-04-13 ENCOUNTER — Encounter: Payer: Self-pay | Admitting: Gynecology

## 2017-04-14 ENCOUNTER — Encounter: Payer: Self-pay | Admitting: Pulmonary Disease

## 2017-04-14 ENCOUNTER — Ambulatory Visit (INDEPENDENT_AMBULATORY_CARE_PROVIDER_SITE_OTHER): Payer: Medicare HMO | Admitting: Pulmonary Disease

## 2017-04-14 DIAGNOSIS — Z820 Family history of epilepsy and other diseases of the nervous system: Secondary | ICD-10-CM | POA: Diagnosis not present

## 2017-04-14 DIAGNOSIS — K649 Unspecified hemorrhoids: Secondary | ICD-10-CM | POA: Diagnosis not present

## 2017-04-14 DIAGNOSIS — E039 Hypothyroidism, unspecified: Secondary | ICD-10-CM | POA: Diagnosis not present

## 2017-04-14 DIAGNOSIS — I480 Paroxysmal atrial fibrillation: Secondary | ICD-10-CM | POA: Diagnosis not present

## 2017-04-14 DIAGNOSIS — K580 Irritable bowel syndrome with diarrhea: Secondary | ICD-10-CM | POA: Diagnosis not present

## 2017-04-14 DIAGNOSIS — J9611 Chronic respiratory failure with hypoxia: Secondary | ICD-10-CM

## 2017-04-14 DIAGNOSIS — J449 Chronic obstructive pulmonary disease, unspecified: Secondary | ICD-10-CM

## 2017-04-14 DIAGNOSIS — B379 Candidiasis, unspecified: Secondary | ICD-10-CM | POA: Diagnosis not present

## 2017-04-14 DIAGNOSIS — I495 Sick sinus syndrome: Secondary | ICD-10-CM | POA: Diagnosis not present

## 2017-04-14 NOTE — Assessment & Plan Note (Addendum)
Nonsmoker. Significant secondhand smoke with husband. Also had wood fire exposure. PFT (03/2016) FEV1  1.5  65%, DLCO 33% Chest CT scan (April/2017): COPD changes. Probably moderate. Mucous plug.   Overall, shortness of breath is stable and improved since resuming Symbicort. She now stays in an assisted facility and they give her symbicort.     Plan : 1. Cont Symbicort, 160/4.5 2P BID (given by staff at facility). Rinse mouth with each use. 2. Cont albuterol, 2 puffs every 4 hours as needed. 3. We did a walk test today and she held her O2 saturation more than 90% on RA. She does not want to use O2 at HS and husband agrees. We will discontinue O2.  4. D/w re flu vaccine.  she received pneumonia vaccine in winter of 2017. I suspect that's pneumonia 23. She will need prevnar 13.

## 2017-04-14 NOTE — Patient Instructions (Signed)
It was a pleasure taking care of you today!  You are diagnosed with Chronic Obstructive Pulmonary Disease or COPD.  COPD is a preventable and treatable disease that makes it difficult to empty air out of the lungs (airflow obstruction).  This can lead to shortness of breath.   Sometimes, when you have a lung infection, this can make your breathing worse, and will cause you to have a COPD flare-up or an acute exacerbation of COPD. Please call your primary care doctor or the office if you are having a COPD flare-up.   Smoking makes COPD worse.   Make sure you use your medications for COPD -- Maintenance medications : Symbicort 160/4.5 2 puffs, 2x/day  Rescue medications: Albuterol 2 puffs every 4 hours as needed for shortness of breath.   Please rinse your mouth each time you use your maintenance medication.  Please call the office if you are having issues with your medications  Return to clinic in 1 year with TP

## 2017-04-14 NOTE — Progress Notes (Signed)
Subjective:    Patient ID: [name], female    DOB: [birth year], 83 y.o.   MRN: [id number]  HPI Pt is here for consultation by Dr. Richarda Overlie  for her PNA/COPD.  Non smoker. Not been dxed with asthma or copd. Exposed to 2nd hand smoke with husband. Also exposed to burning wood.  Has CAD, chronic afib. On coumadin.  She was her usual self until 2-3 weeks ago. Had bronchitis which did not get better with keflex. Pt was admitted from April 4-7 for cough, SOB. CT scan w/o contrast  showed mucus plugs. Cardiac w/u was UR.  D/c with levaquin. Finishing abx. Some better. Still coughs, sob. (-) fevers, chills.  Her o2 sats was 89% on RA sitting.   ROV 07/01/16 Patient saw Rikki Spearing in May as a follow-up and was noted to be doing better. She was restarted on Symbicort. PFT shows moderate obstructive ventilatory defect. She stopped Symbicort since it was not making any difference. Currently, her dyspnea is better. She gets more winded with more than ADLs but does not happen often. Cough is significantly improved. Has not been admitted nor been on antibiotics since last seen. She uses oxygen, 2 L 24 7. Feels better using it.  ROV 04/14/2017 Patient returns to the office as follow-up on her COPD. Since last seen, she states, she has been doing fair. According to the husband, her Alzheimer has been getting worse. She had to be placed in an assisted living facility. Facility has been giving her Symbicort, 2 puffs twice a day. She has not been using her oxygen. Her O2 saturation was more than 90% with exertion. She also has issues using oxygen at bedtime. Half the time, when she wakes up, her nasal cannulae is off.  Review of Systems  Constitutional: Negative.  Negative for chills and fever.  HENT: Positive for congestion.   Eyes: Negative.   Respiratory: Positive for cough and shortness of breath.   Cardiovascular: Negative for chest pain and leg swelling.  Gastrointestinal: Negative for  abdominal distention.  Endocrine: Negative.   Genitourinary: Negative.   Musculoskeletal: Negative.   Allergic/Immunologic: Negative.   Neurological: Negative.   Hematological: Negative.   Psychiatric/Behavioral: Negative.   All other systems reviewed and are negative.     Objective:   Physical Exam  Vitals:  Vitals:   04/14/17 1205  BP: 128/62  Pulse: 60  SpO2: 97%  Weight: 158 lb (71.7 kg)  Height: 5\' 9"  (1.753 m)   Constitutional/General:  Pleasant, well-nourished, well-developed, not in any distress,  Comfortably seating.  Well kempt chronically ill  Body mass index is 23.33 kg/m. Wt Readings from Last 3 Encounters:  04/14/17 158 lb (71.7 kg)  04/10/17 160 lb (72.6 kg)  01/11/17 166 lb 3.2 oz (75.4 kg)     HEENT: Pupils equal and reactive to light and accommodation. Anicteric sclerae. Normal nasal mucosa.   No oral  lesions,  mouth clear,  oropharynx clear, no postnasal drip. (-) Oral thrush. No dental caries.  Airway - Mallampati class III  Neck: No masses. Midline trachea. No JVD, (-) LAD. (-) bruits appreciated.  Respiratory/Chest: Grossly normal chest. (-) deformity. (-) Accessory muscle use.  Symmetric expansion. (-) Tenderness on palpation.  Resonant on percussion.  Diminished BS on both lower lung zones. (-) wheezing, crackles, rhonchi (-) egophony  Cardiovascular: Regular rate and  rhythm, heart sounds normal, no murmur or gallops, no peripheral edema  Gastrointestinal:  Normal bowel sounds. Soft, non-tender. No hepatosplenomegaly.  (-)  masses.   Musculoskeletal:  Normal muscle tone. Normal gait.   Extremities: Grossly normal. (-) clubbing, cyanosis.  (-) edema  Skin: (-) rash,lesions seen.   Neurological/Psychiatric : alert, oriented to time, place, person. Normal mood and affect            Assessment & Plan:  COPD (chronic obstructive pulmonary disease) (HCC) Nonsmoker. Significant secondhand smoke with husband. Also had wood  fire exposure. PFT (03/2016) FEV1  1.5  65%, DLCO 33% Chest CT scan (April/2017): COPD changes. Probably moderate. Mucous plug.   Overall, shortness of breath is stable and improved since resuming Symbicort. She now stays in an assisted facility and they give her symbicort.     Plan : 1. Cont Symbicort, 160/4.5 2P BID (given by staff at facility). Rinse mouth with each use. 2. Cont albuterol, 2 puffs every 4 hours as needed. 3. We did a walk test today and she held her O2 saturation more than 90% on RA. She does not want to use O2 at HS and husband agrees. We will discontinue O2.  4. D/w re flu vaccine.  she received pneumonia vaccine in winter of 2017. I suspect that's pneumonia 23. She will need prevnar 13.    Chronic respiratory failure (HCC) Walk test today revealed patient did not desaturate on room air with exertion. Patient also does not want to use oxygen at bedtime. Husband agrees. We will discontinue oxygen.  Return to clinic in 1 yr.      Pollie Meyer, MD 04/14/2017   1:06 PM Pulmonary and Critical Care Medicine [facility]  h h Pager: [phone] Office: [phone], Fax: [phone]

## 2017-04-14 NOTE — Assessment & Plan Note (Addendum)
Walk test today revealed patient did not desaturate on room air with exertion. Patient also does not want to use oxygen at bedtime. Husband agrees. We will discontinue oxygen.

## 2017-04-15 DIAGNOSIS — Z79899 Other long term (current) drug therapy: Secondary | ICD-10-CM | POA: Diagnosis not present

## 2017-04-15 DIAGNOSIS — Z7901 Long term (current) use of anticoagulants: Secondary | ICD-10-CM | POA: Diagnosis not present

## 2017-04-15 DIAGNOSIS — E785 Hyperlipidemia, unspecified: Secondary | ICD-10-CM | POA: Diagnosis not present

## 2017-04-21 DIAGNOSIS — E039 Hypothyroidism, unspecified: Secondary | ICD-10-CM | POA: Diagnosis not present

## 2017-04-21 DIAGNOSIS — Z79899 Other long term (current) drug therapy: Secondary | ICD-10-CM | POA: Diagnosis not present

## 2017-04-21 DIAGNOSIS — N390 Urinary tract infection, site not specified: Secondary | ICD-10-CM | POA: Diagnosis not present

## 2017-04-21 DIAGNOSIS — I480 Paroxysmal atrial fibrillation: Secondary | ICD-10-CM | POA: Diagnosis not present

## 2017-04-21 DIAGNOSIS — K580 Irritable bowel syndrome with diarrhea: Secondary | ICD-10-CM | POA: Diagnosis not present

## 2017-04-21 DIAGNOSIS — K5900 Constipation, unspecified: Secondary | ICD-10-CM | POA: Diagnosis not present

## 2017-04-21 DIAGNOSIS — J449 Chronic obstructive pulmonary disease, unspecified: Secondary | ICD-10-CM | POA: Diagnosis not present

## 2017-04-21 DIAGNOSIS — M62838 Other muscle spasm: Secondary | ICD-10-CM | POA: Diagnosis not present

## 2017-04-21 DIAGNOSIS — I495 Sick sinus syndrome: Secondary | ICD-10-CM | POA: Diagnosis not present

## 2017-04-26 DIAGNOSIS — Z7901 Long term (current) use of anticoagulants: Secondary | ICD-10-CM | POA: Diagnosis not present

## 2017-04-27 DIAGNOSIS — I495 Sick sinus syndrome: Secondary | ICD-10-CM | POA: Diagnosis not present

## 2017-04-27 DIAGNOSIS — I2510 Atherosclerotic heart disease of native coronary artery without angina pectoris: Secondary | ICD-10-CM | POA: Diagnosis not present

## 2017-04-27 DIAGNOSIS — I482 Chronic atrial fibrillation: Secondary | ICD-10-CM | POA: Diagnosis not present

## 2017-04-27 DIAGNOSIS — Z6822 Body mass index (BMI) 22.0-22.9, adult: Secondary | ICD-10-CM | POA: Diagnosis not present

## 2017-04-27 DIAGNOSIS — I119 Hypertensive heart disease without heart failure: Secondary | ICD-10-CM | POA: Diagnosis not present

## 2017-04-27 DIAGNOSIS — I80291 Phlebitis and thrombophlebitis of other deep vessels of right lower extremity: Secondary | ICD-10-CM | POA: Diagnosis not present

## 2017-05-02 DIAGNOSIS — G309 Alzheimer's disease, unspecified: Secondary | ICD-10-CM | POA: Diagnosis not present

## 2017-05-02 DIAGNOSIS — K5900 Constipation, unspecified: Secondary | ICD-10-CM | POA: Diagnosis not present

## 2017-05-02 DIAGNOSIS — I480 Paroxysmal atrial fibrillation: Secondary | ICD-10-CM | POA: Diagnosis not present

## 2017-05-02 DIAGNOSIS — F321 Major depressive disorder, single episode, moderate: Secondary | ICD-10-CM | POA: Diagnosis not present

## 2017-05-02 DIAGNOSIS — E039 Hypothyroidism, unspecified: Secondary | ICD-10-CM | POA: Diagnosis not present

## 2017-05-02 DIAGNOSIS — M62838 Other muscle spasm: Secondary | ICD-10-CM | POA: Diagnosis not present

## 2017-05-02 DIAGNOSIS — Z820 Family history of epilepsy and other diseases of the nervous system: Secondary | ICD-10-CM | POA: Diagnosis not present

## 2017-05-02 DIAGNOSIS — J449 Chronic obstructive pulmonary disease, unspecified: Secondary | ICD-10-CM | POA: Diagnosis not present

## 2017-05-02 DIAGNOSIS — F0281 Dementia in other diseases classified elsewhere with behavioral disturbance: Secondary | ICD-10-CM | POA: Diagnosis not present

## 2017-05-02 DIAGNOSIS — F418 Other specified anxiety disorders: Secondary | ICD-10-CM | POA: Diagnosis not present

## 2017-05-02 DIAGNOSIS — K580 Irritable bowel syndrome with diarrhea: Secondary | ICD-10-CM | POA: Diagnosis not present

## 2017-05-02 DIAGNOSIS — F4322 Adjustment disorder with anxiety: Secondary | ICD-10-CM | POA: Diagnosis not present

## 2017-05-02 DIAGNOSIS — N390 Urinary tract infection, site not specified: Secondary | ICD-10-CM | POA: Diagnosis not present

## 2017-05-03 DIAGNOSIS — G309 Alzheimer's disease, unspecified: Secondary | ICD-10-CM | POA: Diagnosis not present

## 2017-05-04 DIAGNOSIS — G309 Alzheimer's disease, unspecified: Secondary | ICD-10-CM | POA: Diagnosis not present

## 2017-05-05 DIAGNOSIS — G309 Alzheimer's disease, unspecified: Secondary | ICD-10-CM | POA: Diagnosis not present

## 2017-05-06 DIAGNOSIS — G309 Alzheimer's disease, unspecified: Secondary | ICD-10-CM | POA: Diagnosis not present

## 2017-05-06 DIAGNOSIS — Z7901 Long term (current) use of anticoagulants: Secondary | ICD-10-CM | POA: Diagnosis not present

## 2017-05-07 DIAGNOSIS — G309 Alzheimer's disease, unspecified: Secondary | ICD-10-CM | POA: Diagnosis not present

## 2017-05-08 DIAGNOSIS — G309 Alzheimer's disease, unspecified: Secondary | ICD-10-CM | POA: Diagnosis not present

## 2017-05-09 DIAGNOSIS — G309 Alzheimer's disease, unspecified: Secondary | ICD-10-CM | POA: Diagnosis not present

## 2017-05-10 DIAGNOSIS — G309 Alzheimer's disease, unspecified: Secondary | ICD-10-CM | POA: Diagnosis not present

## 2017-05-11 DIAGNOSIS — G309 Alzheimer's disease, unspecified: Secondary | ICD-10-CM | POA: Diagnosis not present

## 2017-05-12 DIAGNOSIS — E785 Hyperlipidemia, unspecified: Secondary | ICD-10-CM | POA: Diagnosis not present

## 2017-05-12 DIAGNOSIS — F0280 Dementia in other diseases classified elsewhere without behavioral disturbance: Secondary | ICD-10-CM | POA: Diagnosis not present

## 2017-05-12 DIAGNOSIS — Z7901 Long term (current) use of anticoagulants: Secondary | ICD-10-CM | POA: Diagnosis not present

## 2017-05-12 DIAGNOSIS — G309 Alzheimer's disease, unspecified: Secondary | ICD-10-CM | POA: Diagnosis not present

## 2017-05-12 DIAGNOSIS — I4891 Unspecified atrial fibrillation: Secondary | ICD-10-CM | POA: Diagnosis not present

## 2017-05-12 DIAGNOSIS — I10 Essential (primary) hypertension: Secondary | ICD-10-CM | POA: Diagnosis not present

## 2017-05-12 DIAGNOSIS — J449 Chronic obstructive pulmonary disease, unspecified: Secondary | ICD-10-CM | POA: Diagnosis not present

## 2017-05-13 DIAGNOSIS — G309 Alzheimer's disease, unspecified: Secondary | ICD-10-CM | POA: Diagnosis not present

## 2017-05-14 DIAGNOSIS — G309 Alzheimer's disease, unspecified: Secondary | ICD-10-CM | POA: Diagnosis not present

## 2017-05-15 DIAGNOSIS — G309 Alzheimer's disease, unspecified: Secondary | ICD-10-CM | POA: Diagnosis not present

## 2017-05-16 DIAGNOSIS — G309 Alzheimer's disease, unspecified: Secondary | ICD-10-CM | POA: Diagnosis not present

## 2017-05-17 DIAGNOSIS — G309 Alzheimer's disease, unspecified: Secondary | ICD-10-CM | POA: Diagnosis not present

## 2017-05-17 DIAGNOSIS — Z7901 Long term (current) use of anticoagulants: Secondary | ICD-10-CM | POA: Diagnosis not present

## 2017-05-17 DIAGNOSIS — Z79899 Other long term (current) drug therapy: Secondary | ICD-10-CM | POA: Diagnosis not present

## 2017-05-18 DIAGNOSIS — G309 Alzheimer's disease, unspecified: Secondary | ICD-10-CM | POA: Diagnosis not present

## 2017-05-19 DIAGNOSIS — G309 Alzheimer's disease, unspecified: Secondary | ICD-10-CM | POA: Diagnosis not present

## 2017-05-20 DIAGNOSIS — G309 Alzheimer's disease, unspecified: Secondary | ICD-10-CM | POA: Diagnosis not present

## 2017-05-21 DIAGNOSIS — G309 Alzheimer's disease, unspecified: Secondary | ICD-10-CM | POA: Diagnosis not present

## 2017-05-22 DIAGNOSIS — G309 Alzheimer's disease, unspecified: Secondary | ICD-10-CM | POA: Diagnosis not present

## 2017-05-23 DIAGNOSIS — G309 Alzheimer's disease, unspecified: Secondary | ICD-10-CM | POA: Diagnosis not present

## 2017-05-24 DIAGNOSIS — R309 Painful micturition, unspecified: Secondary | ICD-10-CM | POA: Diagnosis not present

## 2017-05-24 DIAGNOSIS — G309 Alzheimer's disease, unspecified: Secondary | ICD-10-CM | POA: Diagnosis not present

## 2017-05-25 DIAGNOSIS — G309 Alzheimer's disease, unspecified: Secondary | ICD-10-CM | POA: Diagnosis not present

## 2017-05-26 DIAGNOSIS — E039 Hypothyroidism, unspecified: Secondary | ICD-10-CM | POA: Diagnosis not present

## 2017-05-26 DIAGNOSIS — I495 Sick sinus syndrome: Secondary | ICD-10-CM | POA: Diagnosis not present

## 2017-05-26 DIAGNOSIS — K580 Irritable bowel syndrome with diarrhea: Secondary | ICD-10-CM | POA: Diagnosis not present

## 2017-05-26 DIAGNOSIS — R079 Chest pain, unspecified: Secondary | ICD-10-CM | POA: Diagnosis not present

## 2017-05-26 DIAGNOSIS — K5900 Constipation, unspecified: Secondary | ICD-10-CM | POA: Diagnosis not present

## 2017-05-26 DIAGNOSIS — M62838 Other muscle spasm: Secondary | ICD-10-CM | POA: Diagnosis not present

## 2017-05-26 DIAGNOSIS — J449 Chronic obstructive pulmonary disease, unspecified: Secondary | ICD-10-CM | POA: Diagnosis not present

## 2017-05-26 DIAGNOSIS — G309 Alzheimer's disease, unspecified: Secondary | ICD-10-CM | POA: Diagnosis not present

## 2017-05-26 DIAGNOSIS — I480 Paroxysmal atrial fibrillation: Secondary | ICD-10-CM | POA: Diagnosis not present

## 2017-05-27 DIAGNOSIS — G309 Alzheimer's disease, unspecified: Secondary | ICD-10-CM | POA: Diagnosis not present

## 2017-05-28 DIAGNOSIS — G309 Alzheimer's disease, unspecified: Secondary | ICD-10-CM | POA: Diagnosis not present

## 2017-05-29 DIAGNOSIS — G309 Alzheimer's disease, unspecified: Secondary | ICD-10-CM | POA: Diagnosis not present

## 2017-05-30 DIAGNOSIS — F0281 Dementia in other diseases classified elsewhere with behavioral disturbance: Secondary | ICD-10-CM | POA: Diagnosis not present

## 2017-05-30 DIAGNOSIS — G309 Alzheimer's disease, unspecified: Secondary | ICD-10-CM | POA: Diagnosis not present

## 2017-05-30 DIAGNOSIS — F4322 Adjustment disorder with anxiety: Secondary | ICD-10-CM | POA: Diagnosis not present

## 2017-05-30 DIAGNOSIS — F418 Other specified anxiety disorders: Secondary | ICD-10-CM | POA: Diagnosis not present

## 2017-05-30 DIAGNOSIS — F321 Major depressive disorder, single episode, moderate: Secondary | ICD-10-CM | POA: Diagnosis not present

## 2017-05-31 DIAGNOSIS — G309 Alzheimer's disease, unspecified: Secondary | ICD-10-CM | POA: Diagnosis not present

## 2017-06-01 DIAGNOSIS — G309 Alzheimer's disease, unspecified: Secondary | ICD-10-CM | POA: Diagnosis not present

## 2017-06-02 DIAGNOSIS — G309 Alzheimer's disease, unspecified: Secondary | ICD-10-CM | POA: Diagnosis not present

## 2017-06-03 DIAGNOSIS — G309 Alzheimer's disease, unspecified: Secondary | ICD-10-CM | POA: Diagnosis not present

## 2017-06-04 DIAGNOSIS — G309 Alzheimer's disease, unspecified: Secondary | ICD-10-CM | POA: Diagnosis not present

## 2017-06-05 DIAGNOSIS — G309 Alzheimer's disease, unspecified: Secondary | ICD-10-CM | POA: Diagnosis not present

## 2017-06-06 DIAGNOSIS — Z111 Encounter for screening for respiratory tuberculosis: Secondary | ICD-10-CM | POA: Diagnosis not present

## 2017-06-06 DIAGNOSIS — I480 Paroxysmal atrial fibrillation: Secondary | ICD-10-CM | POA: Diagnosis not present

## 2017-06-06 DIAGNOSIS — Z820 Family history of epilepsy and other diseases of the nervous system: Secondary | ICD-10-CM | POA: Diagnosis not present

## 2017-06-06 DIAGNOSIS — J449 Chronic obstructive pulmonary disease, unspecified: Secondary | ICD-10-CM | POA: Diagnosis not present

## 2017-06-06 DIAGNOSIS — I495 Sick sinus syndrome: Secondary | ICD-10-CM | POA: Diagnosis not present

## 2017-06-06 DIAGNOSIS — G309 Alzheimer's disease, unspecified: Secondary | ICD-10-CM | POA: Diagnosis not present

## 2017-06-06 DIAGNOSIS — M62838 Other muscle spasm: Secondary | ICD-10-CM | POA: Diagnosis not present

## 2017-06-06 DIAGNOSIS — K580 Irritable bowel syndrome with diarrhea: Secondary | ICD-10-CM | POA: Diagnosis not present

## 2017-06-06 DIAGNOSIS — R634 Abnormal weight loss: Secondary | ICD-10-CM | POA: Diagnosis not present

## 2017-06-07 DIAGNOSIS — G309 Alzheimer's disease, unspecified: Secondary | ICD-10-CM | POA: Diagnosis not present

## 2017-06-08 DIAGNOSIS — G309 Alzheimer's disease, unspecified: Secondary | ICD-10-CM | POA: Diagnosis not present

## 2017-06-09 DIAGNOSIS — G309 Alzheimer's disease, unspecified: Secondary | ICD-10-CM | POA: Diagnosis not present

## 2017-06-10 DIAGNOSIS — G309 Alzheimer's disease, unspecified: Secondary | ICD-10-CM | POA: Diagnosis not present

## 2017-06-12 DIAGNOSIS — G309 Alzheimer's disease, unspecified: Secondary | ICD-10-CM | POA: Diagnosis not present

## 2017-06-13 DIAGNOSIS — G309 Alzheimer's disease, unspecified: Secondary | ICD-10-CM | POA: Diagnosis not present

## 2017-06-14 DIAGNOSIS — G309 Alzheimer's disease, unspecified: Secondary | ICD-10-CM | POA: Diagnosis not present

## 2017-06-15 DIAGNOSIS — G309 Alzheimer's disease, unspecified: Secondary | ICD-10-CM | POA: Diagnosis not present

## 2017-06-16 DIAGNOSIS — G309 Alzheimer's disease, unspecified: Secondary | ICD-10-CM | POA: Diagnosis not present

## 2017-06-17 DIAGNOSIS — G309 Alzheimer's disease, unspecified: Secondary | ICD-10-CM | POA: Diagnosis not present

## 2017-06-18 DIAGNOSIS — G309 Alzheimer's disease, unspecified: Secondary | ICD-10-CM | POA: Diagnosis not present

## 2017-06-19 DIAGNOSIS — G309 Alzheimer's disease, unspecified: Secondary | ICD-10-CM | POA: Diagnosis not present

## 2017-06-20 DIAGNOSIS — G309 Alzheimer's disease, unspecified: Secondary | ICD-10-CM | POA: Diagnosis not present

## 2017-06-21 DIAGNOSIS — G309 Alzheimer's disease, unspecified: Secondary | ICD-10-CM | POA: Diagnosis not present

## 2017-06-22 DIAGNOSIS — G309 Alzheimer's disease, unspecified: Secondary | ICD-10-CM | POA: Diagnosis not present

## 2017-06-23 DIAGNOSIS — J449 Chronic obstructive pulmonary disease, unspecified: Secondary | ICD-10-CM | POA: Diagnosis not present

## 2017-06-23 DIAGNOSIS — I480 Paroxysmal atrial fibrillation: Secondary | ICD-10-CM | POA: Diagnosis not present

## 2017-06-23 DIAGNOSIS — I10 Essential (primary) hypertension: Secondary | ICD-10-CM | POA: Diagnosis not present

## 2017-06-23 DIAGNOSIS — K580 Irritable bowel syndrome with diarrhea: Secondary | ICD-10-CM | POA: Diagnosis not present

## 2017-06-23 DIAGNOSIS — R634 Abnormal weight loss: Secondary | ICD-10-CM | POA: Diagnosis not present

## 2017-06-23 DIAGNOSIS — G309 Alzheimer's disease, unspecified: Secondary | ICD-10-CM | POA: Diagnosis not present

## 2017-06-23 DIAGNOSIS — F419 Anxiety disorder, unspecified: Secondary | ICD-10-CM | POA: Diagnosis not present

## 2017-06-23 DIAGNOSIS — R21 Rash and other nonspecific skin eruption: Secondary | ICD-10-CM | POA: Diagnosis not present

## 2017-06-24 DIAGNOSIS — G309 Alzheimer's disease, unspecified: Secondary | ICD-10-CM | POA: Diagnosis not present

## 2017-06-25 DIAGNOSIS — G309 Alzheimer's disease, unspecified: Secondary | ICD-10-CM | POA: Diagnosis not present

## 2017-06-26 DIAGNOSIS — G309 Alzheimer's disease, unspecified: Secondary | ICD-10-CM | POA: Diagnosis not present

## 2017-06-27 DIAGNOSIS — G309 Alzheimer's disease, unspecified: Secondary | ICD-10-CM | POA: Diagnosis not present

## 2017-06-28 DIAGNOSIS — G309 Alzheimer's disease, unspecified: Secondary | ICD-10-CM | POA: Diagnosis not present

## 2017-06-29 DIAGNOSIS — G309 Alzheimer's disease, unspecified: Secondary | ICD-10-CM | POA: Diagnosis not present

## 2017-06-30 DIAGNOSIS — G309 Alzheimer's disease, unspecified: Secondary | ICD-10-CM | POA: Diagnosis not present

## 2017-07-01 DIAGNOSIS — F321 Major depressive disorder, single episode, moderate: Secondary | ICD-10-CM | POA: Diagnosis not present

## 2017-07-01 DIAGNOSIS — F4322 Adjustment disorder with anxiety: Secondary | ICD-10-CM | POA: Diagnosis not present

## 2017-07-01 DIAGNOSIS — G309 Alzheimer's disease, unspecified: Secondary | ICD-10-CM | POA: Diagnosis not present

## 2017-07-01 DIAGNOSIS — F0281 Dementia in other diseases classified elsewhere with behavioral disturbance: Secondary | ICD-10-CM | POA: Diagnosis not present

## 2017-07-01 DIAGNOSIS — F418 Other specified anxiety disorders: Secondary | ICD-10-CM | POA: Diagnosis not present

## 2017-07-02 DIAGNOSIS — G309 Alzheimer's disease, unspecified: Secondary | ICD-10-CM | POA: Diagnosis not present

## 2017-07-03 DIAGNOSIS — G309 Alzheimer's disease, unspecified: Secondary | ICD-10-CM | POA: Diagnosis not present

## 2017-07-04 DIAGNOSIS — I480 Paroxysmal atrial fibrillation: Secondary | ICD-10-CM | POA: Diagnosis not present

## 2017-07-04 DIAGNOSIS — R634 Abnormal weight loss: Secondary | ICD-10-CM | POA: Diagnosis not present

## 2017-07-04 DIAGNOSIS — K580 Irritable bowel syndrome with diarrhea: Secondary | ICD-10-CM | POA: Diagnosis not present

## 2017-07-04 DIAGNOSIS — F419 Anxiety disorder, unspecified: Secondary | ICD-10-CM | POA: Diagnosis not present

## 2017-07-04 DIAGNOSIS — M62838 Other muscle spasm: Secondary | ICD-10-CM | POA: Diagnosis not present

## 2017-07-04 DIAGNOSIS — K6289 Other specified diseases of anus and rectum: Secondary | ICD-10-CM | POA: Diagnosis not present

## 2017-07-04 DIAGNOSIS — J449 Chronic obstructive pulmonary disease, unspecified: Secondary | ICD-10-CM | POA: Diagnosis not present

## 2017-07-04 DIAGNOSIS — G309 Alzheimer's disease, unspecified: Secondary | ICD-10-CM | POA: Diagnosis not present

## 2017-07-04 DIAGNOSIS — I495 Sick sinus syndrome: Secondary | ICD-10-CM | POA: Diagnosis not present

## 2017-07-05 DIAGNOSIS — G309 Alzheimer's disease, unspecified: Secondary | ICD-10-CM | POA: Diagnosis not present

## 2017-07-06 DIAGNOSIS — G309 Alzheimer's disease, unspecified: Secondary | ICD-10-CM | POA: Diagnosis not present

## 2017-07-07 DIAGNOSIS — G309 Alzheimer's disease, unspecified: Secondary | ICD-10-CM | POA: Diagnosis not present

## 2017-07-08 DIAGNOSIS — G309 Alzheimer's disease, unspecified: Secondary | ICD-10-CM | POA: Diagnosis not present

## 2017-07-09 DIAGNOSIS — G309 Alzheimer's disease, unspecified: Secondary | ICD-10-CM | POA: Diagnosis not present

## 2017-07-10 DIAGNOSIS — G309 Alzheimer's disease, unspecified: Secondary | ICD-10-CM | POA: Diagnosis not present

## 2017-07-11 DIAGNOSIS — G309 Alzheimer's disease, unspecified: Secondary | ICD-10-CM | POA: Diagnosis not present

## 2017-07-11 DIAGNOSIS — H524 Presbyopia: Secondary | ICD-10-CM | POA: Diagnosis not present

## 2017-07-12 DIAGNOSIS — G309 Alzheimer's disease, unspecified: Secondary | ICD-10-CM | POA: Diagnosis not present

## 2017-07-13 DIAGNOSIS — G309 Alzheimer's disease, unspecified: Secondary | ICD-10-CM | POA: Diagnosis not present

## 2017-07-14 DIAGNOSIS — G309 Alzheimer's disease, unspecified: Secondary | ICD-10-CM | POA: Diagnosis not present

## 2017-07-15 DIAGNOSIS — G309 Alzheimer's disease, unspecified: Secondary | ICD-10-CM | POA: Diagnosis not present

## 2017-07-16 DIAGNOSIS — G309 Alzheimer's disease, unspecified: Secondary | ICD-10-CM | POA: Diagnosis not present

## 2017-07-17 DIAGNOSIS — G309 Alzheimer's disease, unspecified: Secondary | ICD-10-CM | POA: Diagnosis not present

## 2017-07-18 DIAGNOSIS — G309 Alzheimer's disease, unspecified: Secondary | ICD-10-CM | POA: Diagnosis not present

## 2017-07-19 DIAGNOSIS — G309 Alzheimer's disease, unspecified: Secondary | ICD-10-CM | POA: Diagnosis not present

## 2017-07-19 DIAGNOSIS — K591 Functional diarrhea: Secondary | ICD-10-CM | POA: Diagnosis not present

## 2017-07-20 ENCOUNTER — Emergency Department
Admission: EM | Admit: 2017-07-20 | Discharge: 2017-07-20 | Disposition: A | Discharge status: Home / Self Care | Payer: Medicare HMO | Attending: Emergency Medicine | Admitting: Emergency Medicine

## 2017-07-20 ENCOUNTER — Encounter: Payer: Self-pay | Admitting: Emergency Medicine

## 2017-07-20 ENCOUNTER — Emergency Department: Payer: Medicare HMO

## 2017-07-20 DIAGNOSIS — E039 Hypothyroidism, unspecified: Secondary | ICD-10-CM | POA: Diagnosis not present

## 2017-07-20 DIAGNOSIS — Z79899 Other long term (current) drug therapy: Secondary | ICD-10-CM | POA: Insufficient documentation

## 2017-07-20 DIAGNOSIS — R0989 Other specified symptoms and signs involving the circulatory and respiratory systems: Secondary | ICD-10-CM | POA: Diagnosis not present

## 2017-07-20 DIAGNOSIS — I2510 Atherosclerotic heart disease of native coronary artery without angina pectoris: Secondary | ICD-10-CM | POA: Diagnosis not present

## 2017-07-20 DIAGNOSIS — I119 Hypertensive heart disease without heart failure: Secondary | ICD-10-CM | POA: Insufficient documentation

## 2017-07-20 DIAGNOSIS — I10 Essential (primary) hypertension: Secondary | ICD-10-CM | POA: Insufficient documentation

## 2017-07-20 DIAGNOSIS — E119 Type 2 diabetes mellitus without complications: Secondary | ICD-10-CM | POA: Insufficient documentation

## 2017-07-20 DIAGNOSIS — J449 Chronic obstructive pulmonary disease, unspecified: Secondary | ICD-10-CM | POA: Diagnosis not present

## 2017-07-20 DIAGNOSIS — R05 Cough: Secondary | ICD-10-CM | POA: Diagnosis not present

## 2017-07-20 DIAGNOSIS — K208 Other esophagitis: Secondary | ICD-10-CM | POA: Diagnosis not present

## 2017-07-20 DIAGNOSIS — J45909 Unspecified asthma, uncomplicated: Secondary | ICD-10-CM | POA: Diagnosis not present

## 2017-07-20 DIAGNOSIS — G309 Alzheimer's disease, unspecified: Secondary | ICD-10-CM | POA: Diagnosis not present

## 2017-07-20 DIAGNOSIS — Z7901 Long term (current) use of anticoagulants: Secondary | ICD-10-CM | POA: Diagnosis not present

## 2017-07-20 DIAGNOSIS — R0602 Shortness of breath: Secondary | ICD-10-CM | POA: Diagnosis not present

## 2017-07-20 DIAGNOSIS — K209 Esophagitis, unspecified: Secondary | ICD-10-CM | POA: Diagnosis not present

## 2017-07-20 DIAGNOSIS — T50905A Adverse effect of unspecified drugs, medicaments and biological substances, initial encounter: Secondary | ICD-10-CM

## 2017-07-20 MED ORDER — GI COCKTAIL ~~LOC~~
30.0000 mL | Freq: Once | ORAL | Status: AC
  Administered 2017-07-20: 30 mL via ORAL
  Filled 2017-07-20: qty 30

## 2017-07-20 NOTE — ED Notes (Signed)
Pt given graham crackers for PO challenge.  

## 2017-07-20 NOTE — ED Notes (Signed)
Patient's discharge and follow up information reviewed with patient by ED nursing staff and patient given the opportunity to ask questions pertaining to ED visit and discharge plan of care. Patient advised that should symptoms not continue to improve, resolve entirely, or should new symptoms develop then a follow up visit with their PCP or a return visit to the ED may be warranted. Patient verbalized consent and understanding of discharge plan of care including potential need for further evaluation. Patient discharged in stable condition per attending ED physician on duty.   Pt returning to [facility] with son and husband via POV.

## 2017-07-20 NOTE — ED Triage Notes (Signed)
Pt presents to ED via [facility] from [facility]. Pt reports she felt a pill got stuck in her throat and was able to cough to clear airway. Reports throat irritation.

## 2017-07-20 NOTE — Discharge Instructions (Signed)
Please return to the emergency department for any concerns such as if you cannot eat or drink, if your pain worsens, or for any other issues. Otherwise follow up with the primary care physician as needed.  It was a pleasure to take care of you today, and thank you for coming to our emergency department.  If you have any questions or concerns before leaving please ask the nurse to grab me and I'm more than happy to go through your aftercare instructions again.  If you were prescribed any opioid pain medication today such as Norco, Vicodin, Percocet, morphine, hydrocodone, or oxycodone please make sure you do not drive when you are taking this medication as it can alter your ability to drive safely.  If you have any concerns once you are home that you are not improving or are in fact getting worse before you can make it to your follow-up appointment, please do not hesitate to call 911 and come back for further evaluation.  Merrily Brittle, MD  Results for orders placed or performed during the hospital encounter of 04/10/17  Lipase, blood  Result Value Ref Range   Lipase 17 11 - 51 U/L  Comprehensive metabolic panel  Result Value Ref Range   Sodium 140 135 - 145 mmol/L   Potassium 3.4 (L) 3.5 - 5.1 mmol/L   Chloride 104 101 - 111 mmol/L   CO2 28 22 - 32 mmol/L   Glucose, Bld 116 (H) 65 - 99 mg/dL   BUN 24 (H) 6 - 20 mg/dL   Creatinine, Ser 4.09 0.44 - 1.00 mg/dL   Calcium 9.5 8.9 - 81.1 mg/dL   Total Protein 6.7 6.5 - 8.1 g/dL   Albumin 4.1 3.5 - 5.0 g/dL   AST 20 15 - 41 U/L   ALT 15 14 - 54 U/L   Alkaline Phosphatase 61 38 - 126 U/L   Total Bilirubin 1.6 (H) 0.3 - 1.2 mg/dL   GFR calc non Af Amer >60 >60 mL/min   GFR calc Af Amer >60 >60 mL/min   Anion gap 8 5 - 15  CBC  Result Value Ref Range   WBC 8.8 3.6 - 11.0 K/uL   RBC 4.79 3.80 - 5.20 MIL/uL   Hemoglobin 13.5 12.0 - 16.0 g/dL   HCT 91.4 78.2 - 95.6 %   MCV 86.0 80.0 - 100.0 fL   MCH 28.1 26.0 - 34.0 pg   MCHC 32.7 32.0 -  36.0 g/dL   RDW 21.3 (H) 08.6 - 57.8 %   Platelets 178 150 - 440 K/uL  Urinalysis, Complete w Microscopic  Result Value Ref Range   Color, Urine YELLOW (A) YELLOW   APPearance CLOUDY (A) CLEAR   Specific Gravity, Urine 1.026 1.005 - 1.030   pH 5.0 5.0 - 8.0   Glucose, UA NEGATIVE NEGATIVE mg/dL   Hgb urine dipstick SMALL (A) NEGATIVE   Bilirubin Urine NEGATIVE NEGATIVE   Ketones, ur NEGATIVE NEGATIVE mg/dL   Protein, ur 30 (A) NEGATIVE mg/dL   Nitrite NEGATIVE NEGATIVE   Leukocytes, UA NEGATIVE NEGATIVE   RBC / HPF 0-5 0 - 5 RBC/hpf   WBC, UA 0-5 0 - 5 WBC/hpf   Bacteria, UA NONE SEEN NONE SEEN   Squamous Epithelial / LPF 0-5 (A) NONE SEEN   Mucus PRESENT    Hyaline Casts, UA PRESENT    Ca Oxalate Crys, UA PRESENT    Dg Chest Port 1 View  Result Date: 07/20/2017 CLINICAL DATA:  Shortness of breath,  cough. EXAM: PORTABLE CHEST 1 VIEW COMPARISON:  Radiographs of March 01, 2016. FINDINGS: Stable cardiomegaly. Single lead left-sided pacemaker is unchanged in position. No pneumothorax or pleural effusion is noted. Both lungs are clear. The visualized skeletal structures are unremarkable. IMPRESSION: No acute cardiopulmonary abnormality seen. Electronically Signed   By: Lupita Raider, M.D.   On: 07/20/2017 20:57

## 2017-07-20 NOTE — ED Provider Notes (Signed)
[facility] Emergency Department Provider Note  ____________________________________________   First MD Initiated Contact with Patient 07/20/17 2034     (approximate)  I have reviewed the triage vital signs and the nursing notes.   HISTORY  Chief Complaint Choking    HPI [name] is a 83 y.o. female who comes to the emergency department via EMS after a brief episode of choking. It all began when she took an unknown medication and felt did not go completely down her throat. She began to cough and gag become short of breath. By the time EMS arrived the episode passed but she still felt somewhat short of breath. She had moderate to severe pain in her lower throat. No drooling. No vomiting. She does not know what medication was. She has never had any issues like this before.   Past Medical History:  Diagnosis Date  . A-fib (HCC)   . Anxiety   . Arthritis   . Bronchitis 02/2016  . Coronary artery disease 12/2007  . Diabetes mellitus    x 10 yrs at least  . History of stress test 08/2011   negative for ischemia it was nongated and nondiagnostic and compatible with a possible scar or peri-infraction ischemia.   Marland Kitchen Hx of echocardiogram 09/2012   showed Ef 35-40% with no significant valve disease except for mild to moderate MR. normal RVSP and left atrium was severly dilated.  . Hypertension   . Pacemaker   . Polio   . Stroke (HCC)   . Thyroid disease    hypothyroidism    Patient Active Problem List   Diagnosis Date Noted  . Asthma 04/19/2016  . Chronic respiratory failure (HCC) 04/19/2016  . COPD (chronic obstructive pulmonary disease) (HCC) 03/08/2016  . Hypoxemia 03/08/2016  . Acute bronchitis 03/02/2016  . Bronchitis 03/01/2016  . Long term (current) use of anticoagulants [Z79.01] 01/27/2016  . Prolapse of vaginal vault after hysterectomy 01/09/2016  . Atypical chest pain 06/03/2015  . Anxiety state 03/28/2015  . Chest pain 02/04/2015  .  Hypertensive heart disease 05/12/2014  . Unspecified constipation 03/05/2014  . Abdominal pain, left lower quadrant 02/14/2014  . Abdominal pain, chronic, right lower quadrant 02/14/2014  . Change in stool 02/14/2014  . Encounter for therapeutic drug monitoring 01/07/2014  . Ataxia 05/11/2013  . Unstable angina (HCC) 05/08/2013  . SSS (sick sinus syndrome)- MDT PTVDP 9/11 05/08/2013  . Cardiomyopathy, suspected to be secondary to AF, not CAD 50-55% at cath June 2014 05/08/2013  . Situational stress 05/08/2013  . Long term current use of anticoagulant therapy 02/19/2013  . History of colonic diverticulitis 01/26/2013  . CAD, non DES to CFX '09, LAD BMS 7/11. Cath OK June 2014 01/23/2013  . Chronic atrial fibrillation (HCC) 01/23/2013  . GERD (gastroesophageal reflux disease) 01/23/2013  . Hypothyroidism 01/23/2013    Past Surgical History:  Procedure Laterality Date  . 3 cardiac stents     she has had non-DES stent to the mid circumflex in 2009 and LAD stent to the mid-distal LAD  BMS June 18, 2010  . ABDOMINAL HYSTERECTOMY  1978   TAH/BSO  . APPENDECTOMY    . CARDIAC SURGERY    . CORONARY ANGIOPLASTY    . LEFT HEART CATHETERIZATION WITH CORONARY ANGIOGRAM N/A 05/10/2013   Procedure: LEFT HEART CATHETERIZATION WITH CORONARY ANGIOGRAM;  Surgeon: Lennette Bihari, MD;  [facility] CATH LAB;  Service: Cardiovascular;  Laterality: N/A;  . PACEMAKER PLACEMENT  08/11/2010   Medtronic Adapta   .  STRESS PERFUSION TEST  10/22/2009   MILD ISCHEMIA IN THE BASAL ANTEROLATERAL AND MID ANTEROLATERAL REGIONS.EF 52%.  . TONSILLECTOMY    . TRANSTHORACIC ECHOCARDIOGRAM  01/17/2009   EF=>55%.IMPAIRED LV RELAXATION.LA IS MODERATELY DILATED.RA IS MILDLY DILATED. RV SYSTOLIC PRESSURE IS ELEVATED AT 30-40MMHG.MILD TO MODERATE TR.MILD MR.SINCE PRIOR TTE,BOTH ATRIA APPEAR TO BE MORE DILATED.    Prior to Admission medications   Medication Sig Start Date End Date Taking? Authorizing Provider  albuterol  (PROVENTIL HFA;VENTOLIN HFA) 108 (90 Base) MCG/ACT inhaler Inhale 1-2 puffs into the lungs every 6 (six) hours as needed for wheezing or shortness of breath. 03/04/16   Richarda Overlie, MD  ALPRAZolam Prudy Feeler) 0.25 MG tablet Take 1 tablet (0.25 mg total) by mouth 2 (two) times daily. 03/04/16   Richarda Overlie, MD  benzonatate (TESSALON) 100 MG capsule Take 1 capsule (100 mg total) by mouth 3 (three) times daily as needed for cough. Patient not taking: Reported on 01/11/2017 02/27/16   Audry Pili, PA-C  budesonide-formoterol [facility]) 160-4.5 MCG/ACT inhaler Inhale 2 puffs into the lungs 2 (two) times daily. 09/28/16   Parrett, Virgel Bouquet, NP  dicyclomine (BENTYL) 10 MG capsule 1 capsule by mouth twice daily as needed for abdominal pain or cramps. 05/31/16   Sherrilyn Rist, MD  digoxin (DIGOX) 0.125 MG tablet Take 1 tablet (125 mcg total) by mouth daily. Patient needs to call our office to schedule an appointment with Dr Tresa Endo for future refills. 01/19/17   Lennette Bihari, MD  digoxin (DIGOX) 0.125 MG tablet Take 1 tablet (125 mcg total) by mouth daily. <PLEASE MAKE APPOINTMENT FOR REFILLS> 01/25/17   Lennette Bihari, MD  guaiFENesin-dextromethorphan [facility] DM) 100-10 MG/5ML syrup Take 10 mLs by mouth every 6 (six) hours. Patient not taking: Reported on 04/14/2017 03/04/16   Richarda Overlie, MD  isosorbide mononitrate (IMDUR) 30 MG 24 hr tablet Take 1 tablet (30 mg total) by mouth daily. Patient needs to call our office to schedule an appointment with Dr Tresa Endo for future refills. 01/19/17   Lennette Bihari, MD  levothyroxine (SYNTHROID, LEVOTHROID) 100 MCG tablet TAKE 1 TABLET BY MOUTH EVERY DAY 04/08/15   Rehman, Joline Maxcy, MD  loperamide (IMODIUM) 2 MG capsule Take 2 mg by mouth as needed for diarrhea or loose stools.    [provider]  metoprolol (LOPRESSOR) 50 MG tablet Take 1 tablet (50 mg total) by mouth twice daily. Patient needs to call our office to schedule an appointment with Dr Tresa Endo for  refills. 01/19/17   Lennette Bihari, MD  NONFORMULARY OR COMPOUNDED ITEM Estradiol 0.02 % 1ml prefilled applicator Sig: apply twice a week 03/15/16   Ok Edwards, MD  ondansetron (ZOFRAN) 4 MG tablet Take 1 tablet (4 mg total) by mouth every 6 (six) hours. Patient not taking: Reported on 04/14/2017 10/30/16   Marlon Pel, PA-C  traMADol (ULTRAM) 50 MG tablet Take 25-50 mg by mouth every 6 (six) hours as needed for pain.     [provider]  warfarin (COUMADIN) 2.5 MG tablet TAKE 1 TABLET BY MOUTH DAILY AS DIRECTED BY COUMADIN CLINIC Patient taking differently: TAKES 1/2 TAB BY MOUTH IN EVENINGS ALL DAYS OF WEEK 07/04/15   Rollene Rotunda, MD    Allergies Iohexol; Penicillins; and Sulfa antibiotics  Family History  Problem Relation Age of Onset  . Heart disease Father   . Cancer Mother     Social History Social History  Substance Use Topics  . Smoking status: Never Smoker  .  Smokeless tobacco: Never Used  . Alcohol use No    Review of Systems Constitutional: No fever/chills ENT: No sore throat. Cardiovascular: Positive chest pain. Respiratory: Denies shortness of breath. Gastrointestinal: No abdominal pain.  No nausea, no vomiting.  No diarrhea.  No constipation. Musculoskeletal: Negative for back pain. Neurological: Negative for headaches   ____________________________________________   PHYSICAL EXAM:  VITAL SIGNS: ED Triage Vitals [07/20/17 2030]  Enc Vitals Group     BP (!) 147/110     Pulse Rate 100     Resp 20     Temp 97.6 F (36.4 C)     Temp Source Oral     SpO2 90 %     Weight 145 lb 11.2 oz (66.1 kg)     Height 5\' 9"  (1.753 m)     Head Circumference      Peak Flow      Pain Score      Pain Loc      Pain Edu?      Excl. in GC?     Constitutional: Alert and oriented 4 pleasant cooperative speaks full clear sentences no diaphoresis Head: Atraumatic. Nose: No congestion/rhinnorhea. Mouth/Throat: No trismus uvula midline no  pharyngeal erythema or exudate Neck: No stridor.   Cardiovascular: Regular rate rhythm no murmurs Respiratory: Normal respiratory effort.  No retractions. Gastrointestinal: Soft nontender Neurologic:  Normal speech and language. No gross focal neurologic deficits are appreciated.  Skin:  Skin is warm, dry and intact. No rash noted.    ____________________________________________  LABS (all labs ordered are listed, but only abnormal results are displayed)  Labs Reviewed - No data to display   __________________________________________  EKG   ____________________________________________  RADIOLOGY  Chest x-ray normal no pneumomediastinum ____________________________________________   PROCEDURES  Procedure(s) performed: no  Procedures  Critical Care performed: no  Observation: no ____________________________________________   INITIAL IMPRESSION / ASSESSMENT AND PLAN / ED COURSE  Pertinent labs & imaging results that were available during my care of the patient were reviewed by me and considered in my medical decision making (see chart for details).  The patient arrives very well-appearing with no trismus no drooling in a normal voice. She likely had a brief esophageal obstruction from her pill that is passed. X-ray is reassuring. I will by mouth challenge her now.      __----------------------------------------- 10:08 PM on 07/20/2017 -----------------------------------------  The patient is able to eat solid food without issues. She will be discharged home with strict return precautions. __________________________________________   FINAL CLINICAL IMPRESSION(S) / ED DIAGNOSES  Final diagnoses:  Pill esophagitis      NEW MEDICATIONS STARTED DURING THIS VISIT:  New Prescriptions   No medications on file     Note:  This document was prepared using Dragon voice recognition software and may include unintentional dictation errors.      Merrily Brittle, MD 07/20/17 2214

## 2017-07-21 DIAGNOSIS — G309 Alzheimer's disease, unspecified: Secondary | ICD-10-CM | POA: Diagnosis not present

## 2017-07-22 DIAGNOSIS — G309 Alzheimer's disease, unspecified: Secondary | ICD-10-CM | POA: Diagnosis not present

## 2017-07-23 DIAGNOSIS — G309 Alzheimer's disease, unspecified: Secondary | ICD-10-CM | POA: Diagnosis not present

## 2017-07-24 DIAGNOSIS — G309 Alzheimer's disease, unspecified: Secondary | ICD-10-CM | POA: Diagnosis not present

## 2017-07-25 DIAGNOSIS — Z820 Family history of epilepsy and other diseases of the nervous system: Secondary | ICD-10-CM | POA: Diagnosis not present

## 2017-07-25 DIAGNOSIS — M62838 Other muscle spasm: Secondary | ICD-10-CM | POA: Diagnosis not present

## 2017-07-25 DIAGNOSIS — F419 Anxiety disorder, unspecified: Secondary | ICD-10-CM | POA: Diagnosis not present

## 2017-07-25 DIAGNOSIS — I495 Sick sinus syndrome: Secondary | ICD-10-CM | POA: Diagnosis not present

## 2017-07-25 DIAGNOSIS — I480 Paroxysmal atrial fibrillation: Secondary | ICD-10-CM | POA: Diagnosis not present

## 2017-07-25 DIAGNOSIS — G309 Alzheimer's disease, unspecified: Secondary | ICD-10-CM | POA: Diagnosis not present

## 2017-07-25 DIAGNOSIS — K580 Irritable bowel syndrome with diarrhea: Secondary | ICD-10-CM | POA: Diagnosis not present

## 2017-07-25 DIAGNOSIS — R634 Abnormal weight loss: Secondary | ICD-10-CM | POA: Diagnosis not present

## 2017-07-25 DIAGNOSIS — J449 Chronic obstructive pulmonary disease, unspecified: Secondary | ICD-10-CM | POA: Diagnosis not present

## 2017-07-26 DIAGNOSIS — G309 Alzheimer's disease, unspecified: Secondary | ICD-10-CM | POA: Diagnosis not present

## 2017-07-27 DIAGNOSIS — G309 Alzheimer's disease, unspecified: Secondary | ICD-10-CM | POA: Diagnosis not present

## 2017-07-28 DIAGNOSIS — G309 Alzheimer's disease, unspecified: Secondary | ICD-10-CM | POA: Diagnosis not present

## 2017-07-29 DIAGNOSIS — G309 Alzheimer's disease, unspecified: Secondary | ICD-10-CM | POA: Diagnosis not present

## 2017-07-30 DIAGNOSIS — G309 Alzheimer's disease, unspecified: Secondary | ICD-10-CM | POA: Diagnosis not present

## 2017-07-31 DIAGNOSIS — G309 Alzheimer's disease, unspecified: Secondary | ICD-10-CM | POA: Diagnosis not present

## 2017-08-01 DIAGNOSIS — G309 Alzheimer's disease, unspecified: Secondary | ICD-10-CM | POA: Diagnosis not present

## 2017-08-02 DIAGNOSIS — Z79899 Other long term (current) drug therapy: Secondary | ICD-10-CM | POA: Diagnosis not present

## 2017-08-02 DIAGNOSIS — G309 Alzheimer's disease, unspecified: Secondary | ICD-10-CM | POA: Diagnosis not present

## 2017-08-03 DIAGNOSIS — G309 Alzheimer's disease, unspecified: Secondary | ICD-10-CM | POA: Diagnosis not present

## 2017-08-04 DIAGNOSIS — I480 Paroxysmal atrial fibrillation: Secondary | ICD-10-CM | POA: Diagnosis not present

## 2017-08-04 DIAGNOSIS — Z820 Family history of epilepsy and other diseases of the nervous system: Secondary | ICD-10-CM | POA: Diagnosis not present

## 2017-08-04 DIAGNOSIS — K580 Irritable bowel syndrome with diarrhea: Secondary | ICD-10-CM | POA: Diagnosis not present

## 2017-08-04 DIAGNOSIS — F419 Anxiety disorder, unspecified: Secondary | ICD-10-CM | POA: Diagnosis not present

## 2017-08-04 DIAGNOSIS — J449 Chronic obstructive pulmonary disease, unspecified: Secondary | ICD-10-CM | POA: Diagnosis not present

## 2017-08-04 DIAGNOSIS — M62838 Other muscle spasm: Secondary | ICD-10-CM | POA: Diagnosis not present

## 2017-08-04 DIAGNOSIS — I495 Sick sinus syndrome: Secondary | ICD-10-CM | POA: Diagnosis not present

## 2017-08-04 DIAGNOSIS — G309 Alzheimer's disease, unspecified: Secondary | ICD-10-CM | POA: Diagnosis not present

## 2017-08-05 DIAGNOSIS — F4322 Adjustment disorder with anxiety: Secondary | ICD-10-CM | POA: Diagnosis not present

## 2017-08-05 DIAGNOSIS — F418 Other specified anxiety disorders: Secondary | ICD-10-CM | POA: Diagnosis not present

## 2017-08-05 DIAGNOSIS — F321 Major depressive disorder, single episode, moderate: Secondary | ICD-10-CM | POA: Diagnosis not present

## 2017-08-05 DIAGNOSIS — F0281 Dementia in other diseases classified elsewhere with behavioral disturbance: Secondary | ICD-10-CM | POA: Diagnosis not present

## 2017-08-05 DIAGNOSIS — G309 Alzheimer's disease, unspecified: Secondary | ICD-10-CM | POA: Diagnosis not present

## 2017-08-06 DIAGNOSIS — G309 Alzheimer's disease, unspecified: Secondary | ICD-10-CM | POA: Diagnosis not present

## 2017-08-07 DIAGNOSIS — G309 Alzheimer's disease, unspecified: Secondary | ICD-10-CM | POA: Diagnosis not present

## 2017-08-08 DIAGNOSIS — I480 Paroxysmal atrial fibrillation: Secondary | ICD-10-CM | POA: Diagnosis not present

## 2017-08-08 DIAGNOSIS — R3914 Feeling of incomplete bladder emptying: Secondary | ICD-10-CM | POA: Diagnosis not present

## 2017-08-08 DIAGNOSIS — Z820 Family history of epilepsy and other diseases of the nervous system: Secondary | ICD-10-CM | POA: Diagnosis not present

## 2017-08-08 DIAGNOSIS — G309 Alzheimer's disease, unspecified: Secondary | ICD-10-CM | POA: Diagnosis not present

## 2017-08-08 DIAGNOSIS — R350 Frequency of micturition: Secondary | ICD-10-CM | POA: Diagnosis not present

## 2017-08-08 DIAGNOSIS — M62838 Other muscle spasm: Secondary | ICD-10-CM | POA: Diagnosis not present

## 2017-08-08 DIAGNOSIS — K580 Irritable bowel syndrome with diarrhea: Secondary | ICD-10-CM | POA: Diagnosis not present

## 2017-08-08 DIAGNOSIS — F419 Anxiety disorder, unspecified: Secondary | ICD-10-CM | POA: Diagnosis not present

## 2017-08-09 DIAGNOSIS — G309 Alzheimer's disease, unspecified: Secondary | ICD-10-CM | POA: Diagnosis not present

## 2017-08-10 DIAGNOSIS — G309 Alzheimer's disease, unspecified: Secondary | ICD-10-CM | POA: Diagnosis not present

## 2017-08-11 DIAGNOSIS — G309 Alzheimer's disease, unspecified: Secondary | ICD-10-CM | POA: Diagnosis not present

## 2017-08-12 DIAGNOSIS — G309 Alzheimer's disease, unspecified: Secondary | ICD-10-CM | POA: Diagnosis not present

## 2017-08-13 DIAGNOSIS — G309 Alzheimer's disease, unspecified: Secondary | ICD-10-CM | POA: Diagnosis not present

## 2017-08-14 DIAGNOSIS — G309 Alzheimer's disease, unspecified: Secondary | ICD-10-CM | POA: Diagnosis not present

## 2017-08-15 DIAGNOSIS — G309 Alzheimer's disease, unspecified: Secondary | ICD-10-CM | POA: Diagnosis not present

## 2017-08-16 DIAGNOSIS — G309 Alzheimer's disease, unspecified: Secondary | ICD-10-CM | POA: Diagnosis not present

## 2017-08-17 DIAGNOSIS — G309 Alzheimer's disease, unspecified: Secondary | ICD-10-CM | POA: Diagnosis not present

## 2017-08-18 DIAGNOSIS — G309 Alzheimer's disease, unspecified: Secondary | ICD-10-CM | POA: Diagnosis not present

## 2017-08-19 DIAGNOSIS — G309 Alzheimer's disease, unspecified: Secondary | ICD-10-CM | POA: Diagnosis not present

## 2017-08-20 DIAGNOSIS — G309 Alzheimer's disease, unspecified: Secondary | ICD-10-CM | POA: Diagnosis not present

## 2017-08-21 DIAGNOSIS — G309 Alzheimer's disease, unspecified: Secondary | ICD-10-CM | POA: Diagnosis not present

## 2017-08-22 DIAGNOSIS — G309 Alzheimer's disease, unspecified: Secondary | ICD-10-CM | POA: Diagnosis not present

## 2017-08-23 DIAGNOSIS — G309 Alzheimer's disease, unspecified: Secondary | ICD-10-CM | POA: Diagnosis not present

## 2017-08-24 DIAGNOSIS — G309 Alzheimer's disease, unspecified: Secondary | ICD-10-CM | POA: Diagnosis not present

## 2017-08-25 DIAGNOSIS — G309 Alzheimer's disease, unspecified: Secondary | ICD-10-CM | POA: Diagnosis not present

## 2017-08-26 DIAGNOSIS — G309 Alzheimer's disease, unspecified: Secondary | ICD-10-CM | POA: Diagnosis not present

## 2017-08-27 DIAGNOSIS — G309 Alzheimer's disease, unspecified: Secondary | ICD-10-CM | POA: Diagnosis not present

## 2017-08-28 DIAGNOSIS — G309 Alzheimer's disease, unspecified: Secondary | ICD-10-CM | POA: Diagnosis not present

## 2017-08-29 DIAGNOSIS — G309 Alzheimer's disease, unspecified: Secondary | ICD-10-CM | POA: Diagnosis not present

## 2017-08-30 DIAGNOSIS — G309 Alzheimer's disease, unspecified: Secondary | ICD-10-CM | POA: Diagnosis not present

## 2017-08-31 DIAGNOSIS — G309 Alzheimer's disease, unspecified: Secondary | ICD-10-CM | POA: Diagnosis not present

## 2017-09-01 DIAGNOSIS — R309 Painful micturition, unspecified: Secondary | ICD-10-CM | POA: Diagnosis not present

## 2017-09-01 DIAGNOSIS — G309 Alzheimer's disease, unspecified: Secondary | ICD-10-CM | POA: Diagnosis not present

## 2017-09-02 DIAGNOSIS — G309 Alzheimer's disease, unspecified: Secondary | ICD-10-CM | POA: Diagnosis not present

## 2017-09-03 DIAGNOSIS — G309 Alzheimer's disease, unspecified: Secondary | ICD-10-CM | POA: Diagnosis not present

## 2017-09-04 DIAGNOSIS — G309 Alzheimer's disease, unspecified: Secondary | ICD-10-CM | POA: Diagnosis not present

## 2017-09-05 DIAGNOSIS — K580 Irritable bowel syndrome with diarrhea: Secondary | ICD-10-CM | POA: Diagnosis not present

## 2017-09-05 DIAGNOSIS — G309 Alzheimer's disease, unspecified: Secondary | ICD-10-CM | POA: Diagnosis not present

## 2017-09-05 DIAGNOSIS — I480 Paroxysmal atrial fibrillation: Secondary | ICD-10-CM | POA: Diagnosis not present

## 2017-09-05 DIAGNOSIS — N390 Urinary tract infection, site not specified: Secondary | ICD-10-CM | POA: Diagnosis not present

## 2017-09-05 DIAGNOSIS — Z820 Family history of epilepsy and other diseases of the nervous system: Secondary | ICD-10-CM | POA: Diagnosis not present

## 2017-09-05 DIAGNOSIS — F438 Other reactions to severe stress: Secondary | ICD-10-CM | POA: Diagnosis not present

## 2017-09-05 DIAGNOSIS — M62838 Other muscle spasm: Secondary | ICD-10-CM | POA: Diagnosis not present

## 2017-09-05 DIAGNOSIS — F419 Anxiety disorder, unspecified: Secondary | ICD-10-CM | POA: Diagnosis not present

## 2017-09-06 DIAGNOSIS — F321 Major depressive disorder, single episode, moderate: Secondary | ICD-10-CM | POA: Diagnosis not present

## 2017-09-06 DIAGNOSIS — F0281 Dementia in other diseases classified elsewhere with behavioral disturbance: Secondary | ICD-10-CM | POA: Diagnosis not present

## 2017-09-06 DIAGNOSIS — G309 Alzheimer's disease, unspecified: Secondary | ICD-10-CM | POA: Diagnosis not present

## 2017-09-06 DIAGNOSIS — F4322 Adjustment disorder with anxiety: Secondary | ICD-10-CM | POA: Diagnosis not present

## 2017-09-06 DIAGNOSIS — F418 Other specified anxiety disorders: Secondary | ICD-10-CM | POA: Diagnosis not present

## 2017-09-07 DIAGNOSIS — G309 Alzheimer's disease, unspecified: Secondary | ICD-10-CM | POA: Diagnosis not present

## 2017-09-08 DIAGNOSIS — G309 Alzheimer's disease, unspecified: Secondary | ICD-10-CM | POA: Diagnosis not present

## 2017-09-09 DIAGNOSIS — G309 Alzheimer's disease, unspecified: Secondary | ICD-10-CM | POA: Diagnosis not present

## 2017-09-10 DIAGNOSIS — G309 Alzheimer's disease, unspecified: Secondary | ICD-10-CM | POA: Diagnosis not present

## 2017-09-11 DIAGNOSIS — G309 Alzheimer's disease, unspecified: Secondary | ICD-10-CM | POA: Diagnosis not present

## 2017-09-12 DIAGNOSIS — G309 Alzheimer's disease, unspecified: Secondary | ICD-10-CM | POA: Diagnosis not present

## 2017-09-13 DIAGNOSIS — G309 Alzheimer's disease, unspecified: Secondary | ICD-10-CM | POA: Diagnosis not present

## 2017-09-14 DIAGNOSIS — G309 Alzheimer's disease, unspecified: Secondary | ICD-10-CM | POA: Diagnosis not present

## 2017-09-15 DIAGNOSIS — G309 Alzheimer's disease, unspecified: Secondary | ICD-10-CM | POA: Diagnosis not present

## 2017-09-15 DIAGNOSIS — R630 Anorexia: Secondary | ICD-10-CM | POA: Diagnosis not present

## 2017-09-15 DIAGNOSIS — N390 Urinary tract infection, site not specified: Secondary | ICD-10-CM | POA: Diagnosis not present

## 2017-09-15 DIAGNOSIS — K580 Irritable bowel syndrome with diarrhea: Secondary | ICD-10-CM | POA: Diagnosis not present

## 2017-09-15 DIAGNOSIS — G4751 Confusional arousals: Secondary | ICD-10-CM | POA: Diagnosis not present

## 2017-09-15 DIAGNOSIS — F419 Anxiety disorder, unspecified: Secondary | ICD-10-CM | POA: Diagnosis not present

## 2017-09-15 DIAGNOSIS — I480 Paroxysmal atrial fibrillation: Secondary | ICD-10-CM | POA: Diagnosis not present

## 2017-09-15 DIAGNOSIS — Z7901 Long term (current) use of anticoagulants: Secondary | ICD-10-CM | POA: Diagnosis not present

## 2017-09-15 DIAGNOSIS — R634 Abnormal weight loss: Secondary | ICD-10-CM | POA: Diagnosis not present

## 2017-09-15 DIAGNOSIS — R451 Restlessness and agitation: Secondary | ICD-10-CM | POA: Diagnosis not present

## 2017-09-16 DIAGNOSIS — G309 Alzheimer's disease, unspecified: Secondary | ICD-10-CM | POA: Diagnosis not present

## 2017-09-16 DIAGNOSIS — R3915 Urgency of urination: Secondary | ICD-10-CM | POA: Diagnosis not present

## 2017-09-16 DIAGNOSIS — R399 Unspecified symptoms and signs involving the genitourinary system: Secondary | ICD-10-CM | POA: Diagnosis not present

## 2017-09-17 DIAGNOSIS — G309 Alzheimer's disease, unspecified: Secondary | ICD-10-CM | POA: Diagnosis not present

## 2017-09-18 DIAGNOSIS — G309 Alzheimer's disease, unspecified: Secondary | ICD-10-CM | POA: Diagnosis not present

## 2017-09-19 DIAGNOSIS — I495 Sick sinus syndrome: Secondary | ICD-10-CM | POA: Diagnosis not present

## 2017-09-19 DIAGNOSIS — R630 Anorexia: Secondary | ICD-10-CM | POA: Diagnosis not present

## 2017-09-19 DIAGNOSIS — G309 Alzheimer's disease, unspecified: Secondary | ICD-10-CM | POA: Diagnosis not present

## 2017-09-19 DIAGNOSIS — G4751 Confusional arousals: Secondary | ICD-10-CM | POA: Diagnosis not present

## 2017-09-19 DIAGNOSIS — R634 Abnormal weight loss: Secondary | ICD-10-CM | POA: Diagnosis not present

## 2017-09-19 DIAGNOSIS — R451 Restlessness and agitation: Secondary | ICD-10-CM | POA: Diagnosis not present

## 2017-09-19 DIAGNOSIS — I10 Essential (primary) hypertension: Secondary | ICD-10-CM | POA: Diagnosis not present

## 2017-09-19 DIAGNOSIS — K5900 Constipation, unspecified: Secondary | ICD-10-CM | POA: Diagnosis not present

## 2017-09-19 DIAGNOSIS — I480 Paroxysmal atrial fibrillation: Secondary | ICD-10-CM | POA: Diagnosis not present

## 2017-09-20 DIAGNOSIS — G309 Alzheimer's disease, unspecified: Secondary | ICD-10-CM | POA: Diagnosis not present

## 2017-09-21 DIAGNOSIS — G309 Alzheimer's disease, unspecified: Secondary | ICD-10-CM | POA: Diagnosis not present

## 2017-09-22 DIAGNOSIS — G309 Alzheimer's disease, unspecified: Secondary | ICD-10-CM | POA: Diagnosis not present

## 2017-09-23 DIAGNOSIS — G309 Alzheimer's disease, unspecified: Secondary | ICD-10-CM | POA: Diagnosis not present

## 2017-09-24 DIAGNOSIS — G309 Alzheimer's disease, unspecified: Secondary | ICD-10-CM | POA: Diagnosis not present

## 2017-09-25 DIAGNOSIS — G309 Alzheimer's disease, unspecified: Secondary | ICD-10-CM | POA: Diagnosis not present

## 2017-09-26 DIAGNOSIS — G309 Alzheimer's disease, unspecified: Secondary | ICD-10-CM | POA: Diagnosis not present

## 2017-09-27 DIAGNOSIS — G309 Alzheimer's disease, unspecified: Secondary | ICD-10-CM | POA: Diagnosis not present

## 2017-09-28 DIAGNOSIS — G309 Alzheimer's disease, unspecified: Secondary | ICD-10-CM | POA: Diagnosis not present

## 2017-09-29 DIAGNOSIS — Z79899 Other long term (current) drug therapy: Secondary | ICD-10-CM | POA: Diagnosis not present

## 2017-09-29 DIAGNOSIS — G309 Alzheimer's disease, unspecified: Secondary | ICD-10-CM | POA: Diagnosis not present

## 2017-09-29 DIAGNOSIS — Z7901 Long term (current) use of anticoagulants: Secondary | ICD-10-CM | POA: Diagnosis not present

## 2017-09-30 DIAGNOSIS — G309 Alzheimer's disease, unspecified: Secondary | ICD-10-CM | POA: Diagnosis not present

## 2017-10-01 DIAGNOSIS — G309 Alzheimer's disease, unspecified: Secondary | ICD-10-CM | POA: Diagnosis not present

## 2017-10-02 DIAGNOSIS — G309 Alzheimer's disease, unspecified: Secondary | ICD-10-CM | POA: Diagnosis not present

## 2017-10-03 DIAGNOSIS — G309 Alzheimer's disease, unspecified: Secondary | ICD-10-CM | POA: Diagnosis not present

## 2017-10-04 DIAGNOSIS — G309 Alzheimer's disease, unspecified: Secondary | ICD-10-CM | POA: Diagnosis not present

## 2017-10-05 DIAGNOSIS — G309 Alzheimer's disease, unspecified: Secondary | ICD-10-CM | POA: Diagnosis not present

## 2017-10-05 DIAGNOSIS — Z23 Encounter for immunization: Secondary | ICD-10-CM | POA: Diagnosis not present

## 2017-10-06 DIAGNOSIS — G309 Alzheimer's disease, unspecified: Secondary | ICD-10-CM | POA: Diagnosis not present

## 2017-10-07 DIAGNOSIS — G309 Alzheimer's disease, unspecified: Secondary | ICD-10-CM | POA: Diagnosis not present

## 2017-10-08 DIAGNOSIS — G309 Alzheimer's disease, unspecified: Secondary | ICD-10-CM | POA: Diagnosis not present

## 2017-10-09 DIAGNOSIS — G309 Alzheimer's disease, unspecified: Secondary | ICD-10-CM | POA: Diagnosis not present

## 2017-10-10 DIAGNOSIS — F0281 Dementia in other diseases classified elsewhere with behavioral disturbance: Secondary | ICD-10-CM | POA: Diagnosis not present

## 2017-10-10 DIAGNOSIS — F418 Other specified anxiety disorders: Secondary | ICD-10-CM | POA: Diagnosis not present

## 2017-10-10 DIAGNOSIS — F321 Major depressive disorder, single episode, moderate: Secondary | ICD-10-CM | POA: Diagnosis not present

## 2017-10-10 DIAGNOSIS — F4322 Adjustment disorder with anxiety: Secondary | ICD-10-CM | POA: Diagnosis not present

## 2017-10-10 DIAGNOSIS — G309 Alzheimer's disease, unspecified: Secondary | ICD-10-CM | POA: Diagnosis not present

## 2017-10-11 DIAGNOSIS — G309 Alzheimer's disease, unspecified: Secondary | ICD-10-CM | POA: Diagnosis not present

## 2017-10-12 DIAGNOSIS — G309 Alzheimer's disease, unspecified: Secondary | ICD-10-CM | POA: Diagnosis not present

## 2017-10-13 DIAGNOSIS — G309 Alzheimer's disease, unspecified: Secondary | ICD-10-CM | POA: Diagnosis not present

## 2017-10-13 DIAGNOSIS — F438 Other reactions to severe stress: Secondary | ICD-10-CM | POA: Diagnosis not present

## 2017-10-13 DIAGNOSIS — G4751 Confusional arousals: Secondary | ICD-10-CM | POA: Diagnosis not present

## 2017-10-13 DIAGNOSIS — I480 Paroxysmal atrial fibrillation: Secondary | ICD-10-CM | POA: Diagnosis not present

## 2017-10-13 DIAGNOSIS — R634 Abnormal weight loss: Secondary | ICD-10-CM | POA: Diagnosis not present

## 2017-10-13 DIAGNOSIS — R630 Anorexia: Secondary | ICD-10-CM | POA: Diagnosis not present

## 2017-10-13 DIAGNOSIS — Z820 Family history of epilepsy and other diseases of the nervous system: Secondary | ICD-10-CM | POA: Diagnosis not present

## 2017-10-13 DIAGNOSIS — R451 Restlessness and agitation: Secondary | ICD-10-CM | POA: Diagnosis not present

## 2017-10-13 DIAGNOSIS — F419 Anxiety disorder, unspecified: Secondary | ICD-10-CM | POA: Diagnosis not present

## 2017-10-14 DIAGNOSIS — G309 Alzheimer's disease, unspecified: Secondary | ICD-10-CM | POA: Diagnosis not present

## 2017-10-15 DIAGNOSIS — G309 Alzheimer's disease, unspecified: Secondary | ICD-10-CM | POA: Diagnosis not present

## 2017-10-16 DIAGNOSIS — G309 Alzheimer's disease, unspecified: Secondary | ICD-10-CM | POA: Diagnosis not present

## 2017-10-17 DIAGNOSIS — G309 Alzheimer's disease, unspecified: Secondary | ICD-10-CM | POA: Diagnosis not present

## 2017-10-18 DIAGNOSIS — G309 Alzheimer's disease, unspecified: Secondary | ICD-10-CM | POA: Diagnosis not present

## 2017-10-19 DIAGNOSIS — Z79899 Other long term (current) drug therapy: Secondary | ICD-10-CM | POA: Diagnosis not present

## 2017-10-19 DIAGNOSIS — I480 Paroxysmal atrial fibrillation: Secondary | ICD-10-CM | POA: Diagnosis not present

## 2017-10-19 DIAGNOSIS — G309 Alzheimer's disease, unspecified: Secondary | ICD-10-CM | POA: Diagnosis not present

## 2017-10-23 DIAGNOSIS — G309 Alzheimer's disease, unspecified: Secondary | ICD-10-CM | POA: Diagnosis not present

## 2017-10-24 DIAGNOSIS — G309 Alzheimer's disease, unspecified: Secondary | ICD-10-CM | POA: Diagnosis not present

## 2017-10-25 DIAGNOSIS — G309 Alzheimer's disease, unspecified: Secondary | ICD-10-CM | POA: Diagnosis not present

## 2017-10-26 DIAGNOSIS — G309 Alzheimer's disease, unspecified: Secondary | ICD-10-CM | POA: Diagnosis not present

## 2017-10-27 DIAGNOSIS — G309 Alzheimer's disease, unspecified: Secondary | ICD-10-CM | POA: Diagnosis not present

## 2017-10-27 DIAGNOSIS — R410 Disorientation, unspecified: Secondary | ICD-10-CM | POA: Diagnosis not present

## 2017-10-28 DIAGNOSIS — N390 Urinary tract infection, site not specified: Secondary | ICD-10-CM | POA: Diagnosis not present

## 2017-10-28 DIAGNOSIS — G309 Alzheimer's disease, unspecified: Secondary | ICD-10-CM | POA: Diagnosis not present

## 2017-10-30 DIAGNOSIS — G309 Alzheimer's disease, unspecified: Secondary | ICD-10-CM | POA: Diagnosis not present

## 2017-10-31 DIAGNOSIS — G309 Alzheimer's disease, unspecified: Secondary | ICD-10-CM | POA: Diagnosis not present

## 2017-11-01 DIAGNOSIS — G309 Alzheimer's disease, unspecified: Secondary | ICD-10-CM | POA: Diagnosis not present

## 2017-11-02 DIAGNOSIS — G309 Alzheimer's disease, unspecified: Secondary | ICD-10-CM | POA: Diagnosis not present

## 2017-11-03 DIAGNOSIS — G309 Alzheimer's disease, unspecified: Secondary | ICD-10-CM | POA: Diagnosis not present

## 2017-11-04 DIAGNOSIS — G309 Alzheimer's disease, unspecified: Secondary | ICD-10-CM | POA: Diagnosis not present

## 2017-11-05 DIAGNOSIS — G309 Alzheimer's disease, unspecified: Secondary | ICD-10-CM | POA: Diagnosis not present

## 2017-11-06 DIAGNOSIS — G309 Alzheimer's disease, unspecified: Secondary | ICD-10-CM | POA: Diagnosis not present

## 2017-11-07 DIAGNOSIS — G309 Alzheimer's disease, unspecified: Secondary | ICD-10-CM | POA: Diagnosis not present

## 2017-11-08 DIAGNOSIS — G309 Alzheimer's disease, unspecified: Secondary | ICD-10-CM | POA: Diagnosis not present

## 2017-11-09 DIAGNOSIS — G7289 Other specified myopathies: Secondary | ICD-10-CM | POA: Diagnosis not present

## 2017-11-09 DIAGNOSIS — F0281 Dementia in other diseases classified elsewhere with behavioral disturbance: Secondary | ICD-10-CM | POA: Diagnosis not present

## 2017-11-09 DIAGNOSIS — G309 Alzheimer's disease, unspecified: Secondary | ICD-10-CM | POA: Diagnosis not present

## 2017-11-09 DIAGNOSIS — I4891 Unspecified atrial fibrillation: Secondary | ICD-10-CM | POA: Diagnosis not present

## 2017-11-09 DIAGNOSIS — M79609 Pain in unspecified limb: Secondary | ICD-10-CM | POA: Diagnosis not present

## 2017-11-09 DIAGNOSIS — Z950 Presence of cardiac pacemaker: Secondary | ICD-10-CM | POA: Diagnosis not present

## 2017-11-09 DIAGNOSIS — I10 Essential (primary) hypertension: Secondary | ICD-10-CM | POA: Diagnosis not present

## 2017-11-09 DIAGNOSIS — R269 Unspecified abnormalities of gait and mobility: Secondary | ICD-10-CM | POA: Diagnosis not present

## 2017-11-10 DIAGNOSIS — G309 Alzheimer's disease, unspecified: Secondary | ICD-10-CM | POA: Diagnosis not present

## 2017-11-11 DIAGNOSIS — E039 Hypothyroidism, unspecified: Secondary | ICD-10-CM | POA: Diagnosis not present

## 2017-11-11 DIAGNOSIS — R269 Unspecified abnormalities of gait and mobility: Secondary | ICD-10-CM | POA: Diagnosis not present

## 2017-11-11 DIAGNOSIS — G309 Alzheimer's disease, unspecified: Secondary | ICD-10-CM | POA: Diagnosis not present

## 2017-11-11 DIAGNOSIS — Z79899 Other long term (current) drug therapy: Secondary | ICD-10-CM | POA: Diagnosis not present

## 2017-11-11 DIAGNOSIS — F419 Anxiety disorder, unspecified: Secondary | ICD-10-CM | POA: Diagnosis not present

## 2017-11-11 DIAGNOSIS — J449 Chronic obstructive pulmonary disease, unspecified: Secondary | ICD-10-CM | POA: Diagnosis not present

## 2017-11-12 DIAGNOSIS — G309 Alzheimer's disease, unspecified: Secondary | ICD-10-CM | POA: Diagnosis not present

## 2017-11-13 DIAGNOSIS — G309 Alzheimer's disease, unspecified: Secondary | ICD-10-CM | POA: Diagnosis not present

## 2017-11-14 DIAGNOSIS — G309 Alzheimer's disease, unspecified: Secondary | ICD-10-CM | POA: Diagnosis not present

## 2017-11-15 DIAGNOSIS — G309 Alzheimer's disease, unspecified: Secondary | ICD-10-CM | POA: Diagnosis not present

## 2017-11-16 DIAGNOSIS — G309 Alzheimer's disease, unspecified: Secondary | ICD-10-CM | POA: Diagnosis not present

## 2017-11-17 DIAGNOSIS — G309 Alzheimer's disease, unspecified: Secondary | ICD-10-CM | POA: Diagnosis not present

## 2017-11-18 ENCOUNTER — Other Ambulatory Visit: Payer: Self-pay

## 2017-11-18 ENCOUNTER — Emergency Department
Admission: EM | Admit: 2017-11-18 | Discharge: 2017-11-18 | Disposition: A | Discharge status: Home / Self Care | Payer: Medicare HMO | Attending: Emergency Medicine | Admitting: Emergency Medicine

## 2017-11-18 ENCOUNTER — Emergency Department: Payer: Medicare HMO

## 2017-11-18 DIAGNOSIS — Y999 Unspecified external cause status: Secondary | ICD-10-CM | POA: Insufficient documentation

## 2017-11-18 DIAGNOSIS — Y939 Activity, unspecified: Secondary | ICD-10-CM | POA: Diagnosis not present

## 2017-11-18 DIAGNOSIS — J449 Chronic obstructive pulmonary disease, unspecified: Secondary | ICD-10-CM | POA: Diagnosis not present

## 2017-11-18 DIAGNOSIS — W0110XA Fall on same level from slipping, tripping and stumbling with subsequent striking against unspecified object, initial encounter: Secondary | ICD-10-CM | POA: Insufficient documentation

## 2017-11-18 DIAGNOSIS — S0990XA Unspecified injury of head, initial encounter: Secondary | ICD-10-CM | POA: Diagnosis not present

## 2017-11-18 DIAGNOSIS — I10 Essential (primary) hypertension: Secondary | ICD-10-CM | POA: Diagnosis not present

## 2017-11-18 DIAGNOSIS — E119 Type 2 diabetes mellitus without complications: Secondary | ICD-10-CM | POA: Insufficient documentation

## 2017-11-18 DIAGNOSIS — E039 Hypothyroidism, unspecified: Secondary | ICD-10-CM | POA: Diagnosis not present

## 2017-11-18 DIAGNOSIS — Z79899 Other long term (current) drug therapy: Secondary | ICD-10-CM | POA: Diagnosis not present

## 2017-11-18 DIAGNOSIS — I2510 Atherosclerotic heart disease of native coronary artery without angina pectoris: Secondary | ICD-10-CM | POA: Diagnosis not present

## 2017-11-18 DIAGNOSIS — Z950 Presence of cardiac pacemaker: Secondary | ICD-10-CM | POA: Diagnosis not present

## 2017-11-18 DIAGNOSIS — W19XXXA Unspecified fall, initial encounter: Secondary | ICD-10-CM

## 2017-11-18 DIAGNOSIS — Y929 Unspecified place or not applicable: Secondary | ICD-10-CM | POA: Insufficient documentation

## 2017-11-18 DIAGNOSIS — Z955 Presence of coronary angioplasty implant and graft: Secondary | ICD-10-CM | POA: Diagnosis not present

## 2017-11-18 DIAGNOSIS — G309 Alzheimer's disease, unspecified: Secondary | ICD-10-CM | POA: Diagnosis not present

## 2017-11-18 LAB — TROPONIN I

## 2017-11-18 LAB — URINALYSIS, COMPLETE (UACMP) WITH MICROSCOPIC
Bacteria, UA: NONE SEEN
Bilirubin Urine: NEGATIVE
GLUCOSE, UA: NEGATIVE mg/dL
Hgb urine dipstick: NEGATIVE
Ketones, ur: NEGATIVE mg/dL
Nitrite: NEGATIVE
PH: 7 (ref 5.0–8.0)
Protein, ur: NEGATIVE mg/dL
SPECIFIC GRAVITY, URINE: 1.016 (ref 1.005–1.030)

## 2017-11-18 LAB — CBC WITH DIFFERENTIAL/PLATELET
BASOS PCT: 1 %
Basophils Absolute: 0.1 10*3/uL (ref 0–0.1)
EOS ABS: 0.1 10*3/uL (ref 0–0.7)
EOS PCT: 2 %
HCT: 37.9 % (ref 35.0–47.0)
HEMOGLOBIN: 12.5 g/dL (ref 12.0–16.0)
LYMPHS ABS: 1.3 10*3/uL (ref 1.0–3.6)
Lymphocytes Relative: 21 %
MCH: 29.7 pg (ref 26.0–34.0)
MCHC: 32.9 g/dL (ref 32.0–36.0)
MCV: 90.3 fL (ref 80.0–100.0)
Monocytes Absolute: 0.8 10*3/uL (ref 0.2–0.9)
Monocytes Relative: 13 %
Neutro Abs: 3.7 10*3/uL (ref 1.4–6.5)
Neutrophils Relative %: 63 %
PLATELETS: 157 10*3/uL (ref 150–440)
RBC: 4.19 MIL/uL (ref 3.80–5.20)
RDW: 15.6 % — ABNORMAL HIGH (ref 11.5–14.5)
WBC: 5.9 10*3/uL (ref 3.6–11.0)

## 2017-11-18 LAB — PROTIME-INR
INR: 2.49
Prothrombin Time: 26.7 seconds — ABNORMAL HIGH (ref 11.4–15.2)

## 2017-11-18 LAB — BASIC METABOLIC PANEL
Anion gap: 9 (ref 5–15)
BUN: 24 mg/dL — AB (ref 6–20)
CHLORIDE: 103 mmol/L (ref 101–111)
CO2: 28 mmol/L (ref 22–32)
CREATININE: 0.49 mg/dL (ref 0.44–1.00)
Calcium: 8.9 mg/dL (ref 8.9–10.3)
Glucose, Bld: 84 mg/dL (ref 65–99)
POTASSIUM: 3.8 mmol/L (ref 3.5–5.1)
SODIUM: 140 mmol/L (ref 135–145)

## 2017-11-18 NOTE — Discharge Instructions (Signed)
Please seek medical attention for any high fevers, chest pain, shortness of breath, change in behavior, persistent vomiting, bloody stool or any other new or concerning symptoms.  

## 2017-11-18 NOTE — ED Triage Notes (Addendum)
Pt is from [employer]. Brought in pov by husband. Pt was found on the ground, fell and hit her head. Pt is on coumadin. Denies pain currently.

## 2017-11-18 NOTE — ED Provider Notes (Signed)
[facility] Emergency Department Provider Note  ____________________________________________   I have reviewed the triage vital signs and the nursing notes.   HISTORY  Chief Complaint Fall   History limited by: Not Limited   HPI [name] is a 83 y.o. female who presents to the emergency department today after a fall and head injury.   LOCATION:hit forehead DURATION:happened today TIMING: suddenly SEVERITY: no LOC QUALITY: patient has been feeling woozy CONTEXT: patient states she has been feeling woozy and unsteady on her feet for weeks. The patient states she just collapsed today. Larey SeatFell and hit her head.  MODIFYING FACTORS: none ASSOCIATED SYMPTOMS: denies any extremity pain. Denies any chest pain. Denies any fevers.  Per medical record review patient has a history of CAD, DM, HTN.  Past Medical History:  Diagnosis Date  . A-fib (HCC)   . Anxiety   . Arthritis   . Bronchitis 02/2016  . Coronary artery disease 12/2007  . Diabetes mellitus    x 10 yrs at least  . History of stress test 08/2011   negative for ischemia it was nongated and nondiagnostic and compatible with a possible scar or peri-infraction ischemia.   Marland Kitchen. Hx of echocardiogram 09/2012   showed Ef 35-40% with no significant valve disease except for mild to moderate MR. normal RVSP and left atrium was severly dilated.  . Hypertension   . Pacemaker   . Polio   . Stroke (HCC)   . Thyroid disease    hypothyroidism    Patient Active Problem List   Diagnosis Date Noted  . Asthma 04/19/2016  . Chronic respiratory failure (HCC) 04/19/2016  . COPD (chronic obstructive pulmonary disease) (HCC) 03/08/2016  . Hypoxemia 03/08/2016  . Acute bronchitis 03/02/2016  . Bronchitis 03/01/2016  . Long term (current) use of anticoagulants [Z79.01] 01/27/2016  . Prolapse of vaginal vault after hysterectomy 01/09/2016  . Atypical chest pain 06/03/2015  . Anxiety state 03/28/2015  . Chest  pain 02/04/2015  . Hypertensive heart disease 05/12/2014  . Unspecified constipation 03/05/2014  . Abdominal pain, left lower quadrant 02/14/2014  . Abdominal pain, chronic, right lower quadrant 02/14/2014  . Change in stool 02/14/2014  . Encounter for therapeutic drug monitoring 01/07/2014  . Ataxia 05/11/2013  . Unstable angina (HCC) 05/08/2013  . SSS (sick sinus syndrome)- MDT PTVDP 9/11 05/08/2013  . Cardiomyopathy, suspected to be secondary to AF, not CAD 50-55% at cath June 2014 05/08/2013  . Situational stress 05/08/2013  . Long term current use of anticoagulant therapy 02/19/2013  . History of colonic diverticulitis 01/26/2013  . CAD, non DES to CFX '09, LAD BMS 7/11. Cath OK June 2014 01/23/2013  . Chronic atrial fibrillation (HCC) 01/23/2013  . GERD (gastroesophageal reflux disease) 01/23/2013  . Hypothyroidism 01/23/2013    Past Surgical History:  Procedure Laterality Date  . 3 cardiac stents     she has had non-DES stent to the mid circumflex in 2009 and LAD stent to the mid-distal LAD  BMS June 18, 2010  . ABDOMINAL HYSTERECTOMY  1978   TAH/BSO  . APPENDECTOMY    . CARDIAC SURGERY    . CORONARY ANGIOPLASTY    . LEFT HEART CATHETERIZATION WITH CORONARY ANGIOGRAM N/A 05/10/2013   Procedure: LEFT HEART CATHETERIZATION WITH CORONARY ANGIOGRAM;  Surgeon: Lennette Biharihomas A Kelly, MD;  [facility] C[facility] LAB;  Service: Cardiovascular;  Laterality: N/A;  . PACEMAKER PLACEMENT  08/11/2010   Medtronic Adapta   . STRESS PERFUSION TEST  10/22/2009   MILD  ISCHEMIA IN THE BASAL ANTEROLATERAL AND MID ANTEROLATERAL REGIONS.EF 52%.  . TONSILLECTOMY    . TRANSTHORACIC ECHOCARDIOGRAM  01/17/2009   EF=>55%.IMPAIRED LV RELAXATION.LA IS MODERATELY DILATED.RA IS MILDLY DILATED. RV SYSTOLIC PRESSURE IS ELEVATED AT 30-40MMHG.MILD TO MODERATE TR.MILD MR.SINCE PRIOR TTE,BOTH ATRIA APPEAR TO BE MORE DILATED.    Prior to Admission medications   Medication Sig Start Date End Date Taking? Authorizing  Provider  albuterol (PROVENTIL HFA;VENTOLIN HFA) 108 (90 Base) MCG/ACT inhaler Inhale 1-2 puffs into the lungs every 6 (six) hours as needed for wheezing or shortness of breath. 03/04/16   Richarda Overlie, MD  ALPRAZolam Prudy Feeler) 0.25 MG tablet Take 1 tablet (0.25 mg total) by mouth 2 (two) times daily. 03/04/16   Richarda Overlie, MD  benzonatate (TESSALON) 100 MG capsule Take 1 capsule (100 mg total) by mouth 3 (three) times daily as needed for cough. Patient not taking: Reported on 01/11/2017 02/27/16   Audry Pili, PA-C  budesonide-formoterol [facility]) 160-4.5 MCG/ACT inhaler Inhale 2 puffs into the lungs 2 (two) times daily. 09/28/16   Parrett, Virgel Bouquet, NP  dicyclomine (BENTYL) 10 MG capsule 1 capsule by mouth twice daily as needed for abdominal pain or cramps. 05/31/16   Sherrilyn Rist, MD  digoxin (DIGOX) 0.125 MG tablet Take 1 tablet (125 mcg total) by mouth daily. Patient needs to call our office to schedule an appointment with Dr Tresa Endo for future refills. 01/19/17   Lennette Bihari, MD  digoxin (DIGOX) 0.125 MG tablet Take 1 tablet (125 mcg total) by mouth daily. <PLEASE MAKE APPOINTMENT FOR REFILLS> 01/25/17   Lennette Bihari, MD  guaiFENesin-dextromethorphan [facility] DM) 100-10 MG/5ML syrup Take 10 mLs by mouth every 6 (six) hours. Patient not taking: Reported on 04/14/2017 03/04/16   Richarda Overlie, MD  isosorbide mononitrate (IMDUR) 30 MG 24 hr tablet Take 1 tablet (30 mg total) by mouth daily. Patient needs to call our office to schedule an appointment with Dr Tresa Endo for future refills. 01/19/17   Lennette Bihari, MD  levothyroxine (SYNTHROID, LEVOTHROID) 100 MCG tablet TAKE 1 TABLET BY MOUTH EVERY DAY 04/08/15   Rehman, Joline Maxcy, MD  loperamide (IMODIUM) 2 MG capsule Take 2 mg by mouth as needed for diarrhea or loose stools.    [provider]  metoprolol (LOPRESSOR) 50 MG tablet Take 1 tablet (50 mg total) by mouth twice daily. Patient needs to call our office to schedule an appointment  with Dr Tresa Endo for refills. 01/19/17   Lennette Bihari, MD  NONFORMULARY OR COMPOUNDED ITEM Estradiol 0.02 % 1ml prefilled applicator Sig: apply twice a week 03/15/16   Ok Edwards, MD  ondansetron (ZOFRAN) 4 MG tablet Take 1 tablet (4 mg total) by mouth every 6 (six) hours. Patient not taking: Reported on 04/14/2017 10/30/16   Marlon Pel, PA-C  traMADol (ULTRAM) 50 MG tablet Take 25-50 mg by mouth every 6 (six) hours as needed for pain.     [provider]  warfarin (COUMADIN) 2.5 MG tablet TAKE 1 TABLET BY MOUTH DAILY AS DIRECTED BY COUMADIN CLINIC Patient taking differently: TAKES 1/2 TAB BY MOUTH IN EVENINGS ALL DAYS OF WEEK 07/04/15   Rollene Rotunda, MD    Allergies Iohexol; Penicillins; and Sulfa antibiotics  Family History  Problem Relation Age of Onset  . Heart disease Father   . Cancer Mother     Social History Social History   Tobacco Use  . Smoking status: Never Smoker  . Smokeless tobacco: Never Used  Substance  Use Topics  . Alcohol use: No    Alcohol/week: 0.0 oz  . Drug use: No    Review of Systems Constitutional: No fever/chills. Positive for generalized weakness. Eyes: No visual changes. ENT: No sore throat. Cardiovascular: Denies chest pain. Respiratory: Denies shortness of breath. Gastrointestinal: No abdominal pain.  No nausea, no vomiting.  No diarrhea.   Genitourinary: Negative for dysuria. Musculoskeletal: Negative for back pain. Skin: Negative for rash. Neurological: Negative for headaches, focal weakness or numbness.  ____________________________________________   PHYSICAL EXAM:  VITAL SIGNS: ED Triage Vitals  Enc Vitals Group     BP 11/18/17 1156 94/60     Pulse Rate 11/18/17 1156 60     Resp 11/18/17 1156 18     Temp 11/18/17 1156 98.6 F (37 C)     Temp Source 11/18/17 1156 Oral     SpO2 11/18/17 1156 93 %     Weight 11/18/17 1152 138 lb (62.6 kg)     Height 11/18/17 1152 5\' 9"  (1.753 m)   Constitutional: Awake and  alert. Well appearing and in no distress. Eyes: Conjunctivae are normal.  ENT   Head: Normocephalic. Slight bruising noted to forehead.    Nose: No congestion/rhinnorhea.   Mouth/Throat: Mucous membranes are moist.   Neck: No stridor. Hematological/Lymphatic/Immunilogical: No cervical lymphadenopathy. Cardiovascular: Normal rate, regular rhythm.  No murmurs, rubs, or gallops.  Respiratory: Normal respiratory effort without tachypnea nor retractions. Breath sounds are clear and equal bilaterally. No wheezes/rales/rhonchi. Gastrointestinal: Soft and non tender. No rebound. No guarding.  Genitourinary: Deferred Musculoskeletal: Normal range of motion in all extremities. No lower extremity edema. Neurologic:  Normal speech and language. Slightly disoriented. Moves all extremities. Sensation intact.   Skin:  Skin is warm, dry and intact. No rash noted. Psychiatric: Mood and affect are normal. Speech and behavior are normal. Patient exhibits appropriate insight and judgment.  ____________________________________________    LABS (pertinent positives/negatives)  CBC wnl except RDW 15.6 BMP wnl except BUN 24 Trop <0.03 UA 6-30 WBCs and squamous epithelial. Small leukocytes  ____________________________________________   EKG  I, Phineas Semen, attending physician, personally viewed and interpreted this EKG  EKG Time: 1157 Rate: 60 Rhythm: ventricular paced rhythm Axis: right axis deviation Intervals: qtc 468 QRS: wide ST changes: no st elevation equivalent Impression: abnormal ekg   ____________________________________________    RADIOLOGY  CT head No acute bleed  ____________________________________________   PROCEDURES  Procedures  ____________________________________________   INITIAL IMPRESSION / ASSESSMENT AND PLAN / ED COURSE  Pertinent labs & imaging results that were available during my care of the patient were reviewed by me and considered  in my medical decision making (see chart for details).  Patient presented to the emergency department today after all. Patient is on coumadin. Has been feeling weak. Ddx would include head bleed/fracture, anemia, infection, electrolyte abnormality amongst other etiologies. Blood without concerning findings. Urine did have some WBCs but also squamous epithelial concerning for poor sample. Will defer antibiotics for now but will send for urine culture. Discussed findings and plan with patient and family.  _________________________________________   FINAL CLINICAL IMPRESSION(S) / ED DIAGNOSES  Final diagnoses:  Fall, initial encounter  Injury of head, initial encounter     Note: This dictation was prepared with Dragon dictation. Any transcriptional errors that result from this process are unintentional     Phineas Semen, MD 11/18/17 1431

## 2017-11-18 NOTE — ED Notes (Signed)
Pt reports she "felt like I was going to fall and then I fell" Pt does not know if she was dizzy or lightheaded. Husband reports pt has been losing weight and has not been eating and drinking normally.

## 2017-11-19 DIAGNOSIS — G309 Alzheimer's disease, unspecified: Secondary | ICD-10-CM | POA: Diagnosis not present

## 2017-11-20 DIAGNOSIS — G309 Alzheimer's disease, unspecified: Secondary | ICD-10-CM | POA: Diagnosis not present

## 2017-11-20 LAB — URINE CULTURE

## 2017-11-23 DIAGNOSIS — G309 Alzheimer's disease, unspecified: Secondary | ICD-10-CM | POA: Diagnosis not present

## 2017-11-24 DIAGNOSIS — G309 Alzheimer's disease, unspecified: Secondary | ICD-10-CM | POA: Diagnosis not present

## 2017-11-25 DIAGNOSIS — G309 Alzheimer's disease, unspecified: Secondary | ICD-10-CM | POA: Diagnosis not present

## 2017-11-26 DIAGNOSIS — G309 Alzheimer's disease, unspecified: Secondary | ICD-10-CM | POA: Diagnosis not present

## 2017-11-27 DIAGNOSIS — G309 Alzheimer's disease, unspecified: Secondary | ICD-10-CM | POA: Diagnosis not present

## 2017-11-28 DIAGNOSIS — G309 Alzheimer's disease, unspecified: Secondary | ICD-10-CM | POA: Diagnosis not present

## 2017-11-29 DIAGNOSIS — G309 Alzheimer's disease, unspecified: Secondary | ICD-10-CM | POA: Diagnosis not present

## 2017-11-30 DIAGNOSIS — R634 Abnormal weight loss: Secondary | ICD-10-CM | POA: Diagnosis not present

## 2017-11-30 DIAGNOSIS — R627 Adult failure to thrive: Secondary | ICD-10-CM | POA: Diagnosis not present

## 2017-11-30 DIAGNOSIS — G309 Alzheimer's disease, unspecified: Secondary | ICD-10-CM | POA: Diagnosis not present

## 2017-11-30 DIAGNOSIS — M216X9 Other acquired deformities of unspecified foot: Secondary | ICD-10-CM | POA: Diagnosis not present

## 2017-11-30 DIAGNOSIS — L84 Corns and callosities: Secondary | ICD-10-CM | POA: Diagnosis not present

## 2017-11-30 DIAGNOSIS — I10 Essential (primary) hypertension: Secondary | ICD-10-CM | POA: Diagnosis not present

## 2017-11-30 DIAGNOSIS — Z7901 Long term (current) use of anticoagulants: Secondary | ICD-10-CM | POA: Diagnosis not present

## 2017-11-30 DIAGNOSIS — F0281 Dementia in other diseases classified elsewhere with behavioral disturbance: Secondary | ICD-10-CM | POA: Diagnosis not present

## 2017-12-01 DIAGNOSIS — G309 Alzheimer's disease, unspecified: Secondary | ICD-10-CM | POA: Diagnosis not present

## 2017-12-02 DIAGNOSIS — G309 Alzheimer's disease, unspecified: Secondary | ICD-10-CM | POA: Diagnosis not present

## 2017-12-03 DIAGNOSIS — G309 Alzheimer's disease, unspecified: Secondary | ICD-10-CM | POA: Diagnosis not present

## 2017-12-04 DIAGNOSIS — G309 Alzheimer's disease, unspecified: Secondary | ICD-10-CM | POA: Diagnosis not present

## 2017-12-05 DIAGNOSIS — G309 Alzheimer's disease, unspecified: Secondary | ICD-10-CM | POA: Diagnosis not present

## 2017-12-06 DIAGNOSIS — Z7901 Long term (current) use of anticoagulants: Secondary | ICD-10-CM | POA: Diagnosis not present

## 2017-12-06 DIAGNOSIS — R636 Underweight: Secondary | ICD-10-CM | POA: Diagnosis not present

## 2017-12-06 DIAGNOSIS — K582 Mixed irritable bowel syndrome: Secondary | ICD-10-CM | POA: Diagnosis not present

## 2017-12-06 DIAGNOSIS — R531 Weakness: Secondary | ICD-10-CM | POA: Diagnosis not present

## 2017-12-06 DIAGNOSIS — G309 Alzheimer's disease, unspecified: Secondary | ICD-10-CM | POA: Diagnosis not present

## 2017-12-06 DIAGNOSIS — R269 Unspecified abnormalities of gait and mobility: Secondary | ICD-10-CM | POA: Diagnosis not present

## 2017-12-06 DIAGNOSIS — F0391 Unspecified dementia with behavioral disturbance: Secondary | ICD-10-CM | POA: Diagnosis not present

## 2017-12-06 DIAGNOSIS — I10 Essential (primary) hypertension: Secondary | ICD-10-CM | POA: Diagnosis not present

## 2017-12-06 DIAGNOSIS — N390 Urinary tract infection, site not specified: Secondary | ICD-10-CM | POA: Diagnosis not present

## 2017-12-07 DIAGNOSIS — G309 Alzheimer's disease, unspecified: Secondary | ICD-10-CM | POA: Diagnosis not present

## 2017-12-08 DIAGNOSIS — G309 Alzheimer's disease, unspecified: Secondary | ICD-10-CM | POA: Diagnosis not present

## 2017-12-08 DIAGNOSIS — I480 Paroxysmal atrial fibrillation: Secondary | ICD-10-CM | POA: Diagnosis not present

## 2017-12-09 DIAGNOSIS — G309 Alzheimer's disease, unspecified: Secondary | ICD-10-CM | POA: Diagnosis not present

## 2017-12-10 DIAGNOSIS — G309 Alzheimer's disease, unspecified: Secondary | ICD-10-CM | POA: Diagnosis not present

## 2017-12-11 DIAGNOSIS — G309 Alzheimer's disease, unspecified: Secondary | ICD-10-CM | POA: Diagnosis not present

## 2017-12-12 DIAGNOSIS — G309 Alzheimer's disease, unspecified: Secondary | ICD-10-CM | POA: Diagnosis not present

## 2017-12-13 DIAGNOSIS — G309 Alzheimer's disease, unspecified: Secondary | ICD-10-CM | POA: Diagnosis not present

## 2017-12-13 DIAGNOSIS — I10 Essential (primary) hypertension: Secondary | ICD-10-CM | POA: Diagnosis not present

## 2017-12-13 DIAGNOSIS — I482 Chronic atrial fibrillation: Secondary | ICD-10-CM | POA: Diagnosis not present

## 2017-12-13 DIAGNOSIS — Z7901 Long term (current) use of anticoagulants: Secondary | ICD-10-CM | POA: Diagnosis not present

## 2017-12-13 DIAGNOSIS — F0390 Unspecified dementia without behavioral disturbance: Secondary | ICD-10-CM | POA: Diagnosis not present

## 2017-12-14 DIAGNOSIS — G309 Alzheimer's disease, unspecified: Secondary | ICD-10-CM | POA: Diagnosis not present

## 2017-12-15 DIAGNOSIS — G309 Alzheimer's disease, unspecified: Secondary | ICD-10-CM | POA: Diagnosis not present

## 2017-12-16 DIAGNOSIS — G309 Alzheimer's disease, unspecified: Secondary | ICD-10-CM | POA: Diagnosis not present

## 2017-12-17 DIAGNOSIS — G309 Alzheimer's disease, unspecified: Secondary | ICD-10-CM | POA: Diagnosis not present

## 2017-12-18 DIAGNOSIS — G309 Alzheimer's disease, unspecified: Secondary | ICD-10-CM | POA: Diagnosis not present

## 2017-12-19 DIAGNOSIS — G309 Alzheimer's disease, unspecified: Secondary | ICD-10-CM | POA: Diagnosis not present

## 2017-12-20 DIAGNOSIS — G309 Alzheimer's disease, unspecified: Secondary | ICD-10-CM | POA: Diagnosis not present

## 2017-12-21 DIAGNOSIS — G309 Alzheimer's disease, unspecified: Secondary | ICD-10-CM | POA: Diagnosis not present

## 2017-12-22 DIAGNOSIS — R269 Unspecified abnormalities of gait and mobility: Secondary | ICD-10-CM | POA: Diagnosis not present

## 2017-12-22 DIAGNOSIS — G4751 Confusional arousals: Secondary | ICD-10-CM | POA: Diagnosis not present

## 2017-12-22 DIAGNOSIS — G309 Alzheimer's disease, unspecified: Secondary | ICD-10-CM | POA: Diagnosis not present

## 2017-12-22 DIAGNOSIS — R627 Adult failure to thrive: Secondary | ICD-10-CM | POA: Diagnosis not present

## 2017-12-22 DIAGNOSIS — K582 Mixed irritable bowel syndrome: Secondary | ICD-10-CM | POA: Diagnosis not present

## 2017-12-22 DIAGNOSIS — F0390 Unspecified dementia without behavioral disturbance: Secondary | ICD-10-CM | POA: Diagnosis not present

## 2017-12-22 DIAGNOSIS — Z7901 Long term (current) use of anticoagulants: Secondary | ICD-10-CM | POA: Diagnosis not present

## 2017-12-22 DIAGNOSIS — R531 Weakness: Secondary | ICD-10-CM | POA: Diagnosis not present

## 2017-12-23 DIAGNOSIS — G309 Alzheimer's disease, unspecified: Secondary | ICD-10-CM | POA: Diagnosis not present

## 2017-12-24 DIAGNOSIS — G309 Alzheimer's disease, unspecified: Secondary | ICD-10-CM | POA: Diagnosis not present

## 2017-12-25 DIAGNOSIS — G309 Alzheimer's disease, unspecified: Secondary | ICD-10-CM | POA: Diagnosis not present

## 2017-12-26 DIAGNOSIS — F419 Anxiety disorder, unspecified: Secondary | ICD-10-CM | POA: Diagnosis not present

## 2017-12-26 DIAGNOSIS — R269 Unspecified abnormalities of gait and mobility: Secondary | ICD-10-CM | POA: Diagnosis not present

## 2017-12-26 DIAGNOSIS — Z79899 Other long term (current) drug therapy: Secondary | ICD-10-CM | POA: Diagnosis not present

## 2017-12-26 DIAGNOSIS — G309 Alzheimer's disease, unspecified: Secondary | ICD-10-CM | POA: Diagnosis not present

## 2017-12-26 DIAGNOSIS — Z7901 Long term (current) use of anticoagulants: Secondary | ICD-10-CM | POA: Diagnosis not present

## 2017-12-26 DIAGNOSIS — I10 Essential (primary) hypertension: Secondary | ICD-10-CM | POA: Diagnosis not present

## 2017-12-27 DIAGNOSIS — I10 Essential (primary) hypertension: Secondary | ICD-10-CM | POA: Diagnosis not present

## 2017-12-27 DIAGNOSIS — F0391 Unspecified dementia with behavioral disturbance: Secondary | ICD-10-CM | POA: Diagnosis not present

## 2017-12-27 DIAGNOSIS — G309 Alzheimer's disease, unspecified: Secondary | ICD-10-CM | POA: Diagnosis not present

## 2017-12-27 DIAGNOSIS — Z7901 Long term (current) use of anticoagulants: Secondary | ICD-10-CM | POA: Diagnosis not present

## 2017-12-27 DIAGNOSIS — I481 Persistent atrial fibrillation: Secondary | ICD-10-CM | POA: Diagnosis not present

## 2017-12-28 DIAGNOSIS — G309 Alzheimer's disease, unspecified: Secondary | ICD-10-CM | POA: Diagnosis not present

## 2017-12-29 DIAGNOSIS — G309 Alzheimer's disease, unspecified: Secondary | ICD-10-CM | POA: Diagnosis not present

## 2017-12-30 DIAGNOSIS — F0281 Dementia in other diseases classified elsewhere with behavioral disturbance: Secondary | ICD-10-CM | POA: Diagnosis not present

## 2017-12-30 DIAGNOSIS — F4322 Adjustment disorder with anxiety: Secondary | ICD-10-CM | POA: Diagnosis not present

## 2017-12-30 DIAGNOSIS — G309 Alzheimer's disease, unspecified: Secondary | ICD-10-CM | POA: Diagnosis not present

## 2017-12-30 DIAGNOSIS — F418 Other specified anxiety disorders: Secondary | ICD-10-CM | POA: Diagnosis not present

## 2017-12-30 DIAGNOSIS — F321 Major depressive disorder, single episode, moderate: Secondary | ICD-10-CM | POA: Diagnosis not present

## 2017-12-31 DIAGNOSIS — G309 Alzheimer's disease, unspecified: Secondary | ICD-10-CM | POA: Diagnosis not present

## 2018-01-01 DIAGNOSIS — G309 Alzheimer's disease, unspecified: Secondary | ICD-10-CM | POA: Diagnosis not present

## 2018-01-02 DIAGNOSIS — G309 Alzheimer's disease, unspecified: Secondary | ICD-10-CM | POA: Diagnosis not present

## 2018-01-03 DIAGNOSIS — G309 Alzheimer's disease, unspecified: Secondary | ICD-10-CM | POA: Diagnosis not present

## 2018-01-04 DIAGNOSIS — G309 Alzheimer's disease, unspecified: Secondary | ICD-10-CM | POA: Diagnosis not present

## 2018-01-05 ENCOUNTER — Emergency Department
Admission: EM | Admit: 2018-01-05 | Discharge: 2018-01-05 | Disposition: A | Discharge status: Home / Self Care | Payer: Medicare HMO | Attending: Emergency Medicine | Admitting: Emergency Medicine

## 2018-01-05 ENCOUNTER — Emergency Department: Payer: Medicare HMO

## 2018-01-05 DIAGNOSIS — Y999 Unspecified external cause status: Secondary | ICD-10-CM | POA: Insufficient documentation

## 2018-01-05 DIAGNOSIS — N390 Urinary tract infection, site not specified: Secondary | ICD-10-CM | POA: Diagnosis not present

## 2018-01-05 DIAGNOSIS — Y9210 Unspecified residential institution as the place of occurrence of the external cause: Secondary | ICD-10-CM | POA: Insufficient documentation

## 2018-01-05 DIAGNOSIS — R9431 Abnormal electrocardiogram [ECG] [EKG]: Secondary | ICD-10-CM | POA: Diagnosis not present

## 2018-01-05 DIAGNOSIS — Y939 Activity, unspecified: Secondary | ICD-10-CM | POA: Insufficient documentation

## 2018-01-05 DIAGNOSIS — I2510 Atherosclerotic heart disease of native coronary artery without angina pectoris: Secondary | ICD-10-CM | POA: Insufficient documentation

## 2018-01-05 DIAGNOSIS — I481 Persistent atrial fibrillation: Secondary | ICD-10-CM | POA: Diagnosis not present

## 2018-01-05 DIAGNOSIS — E039 Hypothyroidism, unspecified: Secondary | ICD-10-CM | POA: Insufficient documentation

## 2018-01-05 DIAGNOSIS — J449 Chronic obstructive pulmonary disease, unspecified: Secondary | ICD-10-CM | POA: Diagnosis not present

## 2018-01-05 DIAGNOSIS — W19XXXA Unspecified fall, initial encounter: Secondary | ICD-10-CM | POA: Diagnosis not present

## 2018-01-05 DIAGNOSIS — Z950 Presence of cardiac pacemaker: Secondary | ICD-10-CM | POA: Diagnosis not present

## 2018-01-05 DIAGNOSIS — S0990XA Unspecified injury of head, initial encounter: Secondary | ICD-10-CM | POA: Diagnosis not present

## 2018-01-05 DIAGNOSIS — G309 Alzheimer's disease, unspecified: Secondary | ICD-10-CM | POA: Diagnosis not present

## 2018-01-05 DIAGNOSIS — Z7902 Long term (current) use of antithrombotics/antiplatelets: Secondary | ICD-10-CM | POA: Diagnosis not present

## 2018-01-05 DIAGNOSIS — E119 Type 2 diabetes mellitus without complications: Secondary | ICD-10-CM | POA: Insufficient documentation

## 2018-01-05 DIAGNOSIS — R51 Headache: Secondary | ICD-10-CM | POA: Diagnosis not present

## 2018-01-05 DIAGNOSIS — I10 Essential (primary) hypertension: Secondary | ICD-10-CM | POA: Insufficient documentation

## 2018-01-05 DIAGNOSIS — Z7901 Long term (current) use of anticoagulants: Secondary | ICD-10-CM | POA: Diagnosis not present

## 2018-01-05 DIAGNOSIS — Z79899 Other long term (current) drug therapy: Secondary | ICD-10-CM | POA: Diagnosis not present

## 2018-01-05 LAB — CBC WITH DIFFERENTIAL/PLATELET
BASOS ABS: 0.1 10*3/uL (ref 0–0.1)
Basophils Relative: 1 %
EOS ABS: 0.1 10*3/uL (ref 0–0.7)
Eosinophils Relative: 1 %
HCT: 36.5 % (ref 35.0–47.0)
HEMOGLOBIN: 11.9 g/dL — AB (ref 12.0–16.0)
LYMPHS ABS: 1 10*3/uL (ref 1.0–3.6)
LYMPHS PCT: 10 %
MCH: 29.6 pg (ref 26.0–34.0)
MCHC: 32.5 g/dL (ref 32.0–36.0)
MCV: 91.1 fL (ref 80.0–100.0)
Monocytes Absolute: 1.1 10*3/uL — ABNORMAL HIGH (ref 0.2–0.9)
Monocytes Relative: 12 %
NEUTROS PCT: 76 %
Neutro Abs: 7.1 10*3/uL — ABNORMAL HIGH (ref 1.4–6.5)
PLATELETS: 188 10*3/uL (ref 150–440)
RBC: 4.01 MIL/uL (ref 3.80–5.20)
RDW: 15.3 % — ABNORMAL HIGH (ref 11.5–14.5)
WBC: 9.3 10*3/uL (ref 3.6–11.0)

## 2018-01-05 LAB — BASIC METABOLIC PANEL
ANION GAP: 8 (ref 5–15)
BUN: 19 mg/dL (ref 6–20)
CHLORIDE: 101 mmol/L (ref 101–111)
CO2: 31 mmol/L (ref 22–32)
Calcium: 8.7 mg/dL — ABNORMAL LOW (ref 8.9–10.3)
Creatinine, Ser: 0.6 mg/dL (ref 0.44–1.00)
GFR calc Af Amer: >60 mL/min (ref >60)
Glucose, Bld: 111 mg/dL — ABNORMAL HIGH (ref 65–99)
POTASSIUM: 4 mmol/L (ref 3.5–5.1)
SODIUM: 140 mmol/L (ref 135–145)

## 2018-01-05 LAB — DIGOXIN LEVEL: Digoxin Level: 0.6 ng/mL — ABNORMAL LOW (ref 0.8–2.0)

## 2018-01-05 LAB — URINALYSIS, COMPLETE (UACMP) WITH MICROSCOPIC
BILIRUBIN URINE: NEGATIVE
GLUCOSE, UA: NEGATIVE mg/dL
Ketones, ur: NEGATIVE mg/dL
NITRITE: POSITIVE — AB
PH: 6 (ref 5.0–8.0)
PROTEIN: 30 mg/dL — AB
Specific Gravity, Urine: 1.017 (ref 1.005–1.030)

## 2018-01-05 LAB — TROPONIN I

## 2018-01-05 LAB — PROTIME-INR
INR: 1.24
Prothrombin Time: 15.5 seconds — ABNORMAL HIGH (ref 11.4–15.2)

## 2018-01-05 MED ORDER — CIPROFLOXACIN HCL 500 MG PO TABS: 500.0000 mg | ORAL_TABLET | Freq: Two times a day (BID) | ORAL | 0 refills | Status: AC

## 2018-01-05 MED ORDER — CIPROFLOXACIN IN D5W 400 MG/200ML IV SOLN
400.0000 mg | Freq: Once | INTRAVENOUS | Status: AC
  Administered 2018-01-05: 400 mg via INTRAVENOUS
  Filled 2018-01-05: qty 200

## 2018-01-05 NOTE — ED Triage Notes (Signed)
Pt presents via [facility] from [facility]. Pt fell at RN station obut was un witnessed. Pt is complaining of pain on the left side at temple. Pt takes warfin.

## 2018-01-05 NOTE — ED Notes (Signed)
Patient transported to CT 

## 2018-01-05 NOTE — ED Notes (Signed)
Pt discharged to [facility] with staff. They came to pick uup pt. Went over discharge and RX with staff member. VSS. NAD.

## 2018-01-05 NOTE — ED Provider Notes (Signed)
[facility] Emergency Department Provider Note   ____________________________________________   I have reviewed the triage vital signs and the nursing notes.   HISTORY  Chief Complaint Fall   History limited by: Not Limited   HPI [name] is a 84 y.o. female who presents to the emergency department today because of concern for unwitnessed fall. Patient is coming from [facility]. She states that she did feel a little light headed before the fall. She denies any chest pain or palpitations. Denies any recent illness. After the fall she is complaining of some headache to the left side. She is on warfaran. Patient states that she does have some gait instability at baseline.    Per medical record review patient has a history of afib  Past Medical History:  Diagnosis Date  . A-fib (HCC)   . Anxiety   . Arthritis   . Bronchitis 02/2016  . Coronary artery disease 12/2007  . Diabetes mellitus    x 10 yrs at least  . History of stress test 08/2011   negative for ischemia it was nongated and nondiagnostic and compatible with a possible scar or peri-infraction ischemia.   Marland Kitchen Hx of echocardiogram 09/2012   showed Ef 35-40% with no significant valve disease except for mild to moderate MR. normal RVSP and left atrium was severly dilated.  . Hypertension   . Pacemaker   . Polio   . Stroke (HCC)   . Thyroid disease    hypothyroidism    Patient Active Problem List   Diagnosis Date Noted  . Asthma 04/19/2016  . Chronic respiratory failure (HCC) 04/19/2016  . COPD (chronic obstructive pulmonary disease) (HCC) 03/08/2016  . Hypoxemia 03/08/2016  . Acute bronchitis 03/02/2016  . Bronchitis 03/01/2016  . Long term (current) use of anticoagulants [Z79.01] 01/27/2016  . Prolapse of vaginal vault after hysterectomy 01/09/2016  . Atypical chest pain 06/03/2015  . Anxiety state 03/28/2015  . Chest pain 02/04/2015  . Hypertensive heart disease 05/12/2014  .  Unspecified constipation 03/05/2014  . Abdominal pain, left lower quadrant 02/14/2014  . Abdominal pain, chronic, right lower quadrant 02/14/2014  . Change in stool 02/14/2014  . Encounter for therapeutic drug monitoring 01/07/2014  . Ataxia 05/11/2013  . Unstable angina (HCC) 05/08/2013  . SSS (sick sinus syndrome)- MDT PTVDP 9/11 05/08/2013  . Cardiomyopathy, suspected to be secondary to AF, not CAD 50-55% at cath June 2014 05/08/2013  . Situational stress 05/08/2013  . Long term current use of anticoagulant therapy 02/19/2013  . History of colonic diverticulitis 01/26/2013  . CAD, non DES to CFX '09, LAD BMS 7/11. Cath OK June 2014 01/23/2013  . Chronic atrial fibrillation (HCC) 01/23/2013  . GERD (gastroesophageal reflux disease) 01/23/2013  . Hypothyroidism 01/23/2013    Past Surgical History:  Procedure Laterality Date  . 3 cardiac stents     she has had non-DES stent to the mid circumflex in 2009 and LAD stent to the mid-distal LAD  BMS June 18, 2010  . ABDOMINAL HYSTERECTOMY  1978   TAH/BSO  . APPENDECTOMY    . CARDIAC SURGERY    . CORONARY ANGIOPLASTY    . LEFT HEART CATHETERIZATION WITH CORONARY ANGIOGRAM N/A 05/10/2013   Procedure: LEFT HEART CATHETERIZATION WITH CORONARY ANGIOGRAM;  Surgeon: Lennette Bihari, MD;  Location: [facility] CATH LAB;  Service: Cardiovascular;  Laterality: N/A;  . PACEMAKER PLACEMENT  08/11/2010   Medtronic Adapta   . STRESS PERFUSION TEST  10/22/2009   MILD ISCHEMIA IN  THE BASAL ANTEROLATERAL AND MID ANTEROLATERAL REGIONS.EF 52%.  . TONSILLECTOMY    . TRANSTHORACIC ECHOCARDIOGRAM  01/17/2009   EF=>55%.IMPAIRED LV RELAXATION.LA IS MODERATELY DILATED.RA IS MILDLY DILATED. RV SYSTOLIC PRESSURE IS ELEVATED AT 30-40MMHG.MILD TO MODERATE TR.MILD MR.SINCE PRIOR TTE,BOTH ATRIA APPEAR TO BE MORE DILATED.    Prior to Admission medications   Medication Sig Start Date End Date Taking? Authorizing Provider  ALPRAZolam (XANAX) 0.25 MG tablet Take 1 tablet (0.25  mg total) by mouth 2 (two) times daily. Patient taking differently: Take 0.5 mg by mouth 2 (two) times daily.  03/04/16   Richarda Overlie, MD  benzonatate (TESSALON) 100 MG capsule Take 100 mg by mouth 3 (three) times daily as needed for cough.    [provider]  budesonide-formoterol (SYMBICORT) 160-4.5 MCG/ACT inhaler Inhale 2 puffs into the lungs 2 (two) times daily. 09/28/16   Parrett, Virgel Bouquet, NP  digoxin (DIGOX) 0.125 MG tablet Take 1 tablet (125 mcg total) by mouth daily. Patient needs to call our office to schedule an appointment with Dr Tresa Endo for future refills. 01/19/17   Lennette Bihari, MD  diphenoxylate-atropine (LOMOTIL) 2.5-0.025 MG tablet Take 1 tablet by mouth daily as needed for diarrhea or loose stools.    [provider]  divalproex (DEPAKOTE SPRINKLE) 125 MG capsule Take 125 mg by mouth 2 (two) times daily.    [provider]  donepezil (ARICEPT) 5 MG tablet Take 5 mg by mouth at bedtime.    [provider]  FLUoxetine (PROZAC) 20 MG capsule Take 20 mg by mouth daily.    [provider]  guaiFENesin-dextromethorphan (ROBITUSSIN DM) 100-10 MG/5ML syrup Take 10 mLs by mouth every 6 (six) hours. Patient not taking: Reported on 11/18/2017 03/04/16   Richarda Overlie, MD  hydrochlorothiazide (MICROZIDE) 12.5 MG capsule Take 12.5 mg by mouth daily.    [provider]  isosorbide mononitrate (IMDUR) 30 MG 24 hr tablet Take 1 tablet (30 mg total) by mouth daily. Patient needs to call our office to schedule an appointment with Dr Tresa Endo for future refills. 01/19/17   Lennette Bihari, MD  levothyroxine (SYNTHROID, LEVOTHROID) 100 MCG tablet TAKE 1 TABLET BY MOUTH EVERY DAY 04/08/15   Rehman, Joline Maxcy, MD  loperamide (IMODIUM) 2 MG capsule Take 2 mg by mouth daily as needed for diarrhea or loose stools.     [provider]  Melatonin 1 MG TABS Take 1 tablet by mouth 2 (two) times daily.    [provider]  memantine (NAMENDA) 5 MG  tablet Take 5 mg by mouth 2 (two) times daily.    [provider]  metoprolol (LOPRESSOR) 50 MG tablet Take 1 tablet (50 mg total) by mouth twice daily. Patient needs to call our office to schedule an appointment with Dr Tresa Endo for refills. 01/19/17   Lennette Bihari, MD  NONFORMULARY OR COMPOUNDED ITEM Estradiol 0.02 % 79ml prefilled applicator Sig: apply twice a week Patient not taking: Reported on 11/18/2017 03/15/16   Ok Edwards, MD  pravastatin (PRAVACHOL) 20 MG tablet Take 20 mg by mouth daily.    [provider]  Probiotic Product (ALIGN) 4 MG CAPS Take 1 capsule by mouth daily.    [provider]  traMADol (ULTRAM) 50 MG tablet Take 25 mg by mouth daily as needed.     [provider]  warfarin (COUMADIN) 2.5 MG tablet TAKE 1 TABLET BY MOUTH DAILY AS DIRECTED BY COUMADIN CLINIC Patient taking differently: TAKE 1 TABLET BY  MOUTH DAILY 07/04/15   Rollene Rotunda, MD    Allergies Iohexol; Penicillins; and Sulfa antibiotics  Family History  Problem Relation Age of Onset  . Heart disease Father   . Cancer Mother     Social History Social History   Tobacco Use  . Smoking status: Never Smoker  . Smokeless tobacco: Never Used  Substance Use Topics  . Alcohol use: No    Alcohol/week: 0.0 oz  . Drug use: No    Review of Systems Constitutional: No fever/chills Eyes: No visual changes. ENT: No sore throat. Cardiovascular: Denies chest pain. Respiratory: Denies shortness of breath. Gastrointestinal: No abdominal pain.  No nausea, no vomiting.  No diarrhea.   Genitourinary: Negative for dysuria. Musculoskeletal: Negative for back pain. Skin: Negative for rash. Neurological: Positive for headache.  ____________________________________________   PHYSICAL EXAM:  VITAL SIGNS: ED Triage Vitals  Enc Vitals Group     BP 01/05/18 1018 137/73     Pulse --      Resp 01/05/18 1018 19     Temp 01/05/18 1018 97.8 F (36.6 C)     Temp Source  01/05/18 1018 Oral     SpO2 01/05/18 1018 100 %     Weight 01/05/18 1020 138 lb (62.6 kg)     Height 01/05/18 1020 5\' 7"  (1.702 m)     Head Circumference --      Peak Flow --      Pain Score 01/05/18 1020 2   Constitutional: Alert and oriented. Well appearing and in no distress. Eyes: Conjunctivae are normal.  ENT   Head: Normocephalic and atraumatic.   Nose: No congestion/rhinnorhea.   Mouth/Throat: Mucous membranes are moist.   Neck: No stridor. Hematological/Lymphatic/Immunilogical: No cervical lymphadenopathy. Cardiovascular: Normal rate, regular rhythm.  No murmurs, rubs, or gallops.  Respiratory: Normal respiratory effort without tachypnea nor retractions. Breath sounds are clear and equal bilaterally. No wheezes/rales/rhonchi. Gastrointestinal: Soft and non tender. No rebound. No guarding.  Genitourinary: Deferred Musculoskeletal: Normal range of motion in all extremities. No lower extremity edema. Neurologic:  Normal speech and language. No gross focal neurologic deficits are appreciated.  Skin:  Skin is warm, dry and intact. No rash noted. Psychiatric: Mood and affect are normal. Speech and behavior are normal. Patient exhibits appropriate insight and judgment.  ____________________________________________    LABS (pertinent positives/negatives)  Digoxin level 0.6 Trop <0.03 BMP glu 111, ca 8.7 otherwise wnl CBC wbc 9.3, hgb 11.9, plt 188 INR 1.24 UA positive nitrite, too numerous to count WBC, large leukocytes ____________________________________________   EKG  I, Phineas Semen, attending physician, personally viewed and interpreted this EKG  EKG Time: 1029 Rate: 60 Rhythm: atrial fibrillation Axis: right axis deviation Intervals: qtc 493 QRS: nonspecific IVCD ST changes: no st elevation Impression: abnormal ekg   ____________________________________________    RADIOLOGY  CT head No acute bleed or  fracture   ____________________________________________   PROCEDURES  Procedures  ____________________________________________   INITIAL IMPRESSION / ASSESSMENT AND PLAN / ED COURSE  Pertinent labs & imaging results that were available during my care of the patient were reviewed by me and considered in my medical decision making (see chart for details).  Patient presented to the emergency department today after a fall.  Patient is on warfarin.  Head CT without any acute bleed or fracture.  Patient was slightly lightheaded so workup to evaluate for anemia, infection, electrolyte abnormalities, cardiac cause of the fall were ordered.  This workup was most consistent with a urinary tract infection.  Patient will be given dose of IV antibiotics here in the emergency department discharged with further antibiotics.  ____________________________________________   FINAL CLINICAL IMPRESSION(S) / ED DIAGNOSES  Final diagnoses:  Fall, initial encounter  Lower urinary tract infectious disease     Note: This dictation was prepared with Nurse, children's dictation. Any transcriptional errors that result from this process are unintentional     Phineas Semen, MD 01/05/18 [phone]

## 2018-01-05 NOTE — Discharge Instructions (Signed)
It is important that you have your INR (warfarin levels) checked in the next 2-3 days. The antibiotic that you have been prescribed can alter these levels. Please seek medical attention for any high fevers, chest pain, shortness of breath, change in behavior, persistent vomiting, bloody stool or any other new or concerning symptoms.

## 2018-01-06 DIAGNOSIS — G309 Alzheimer's disease, unspecified: Secondary | ICD-10-CM | POA: Diagnosis not present

## 2018-01-07 DIAGNOSIS — G309 Alzheimer's disease, unspecified: Secondary | ICD-10-CM | POA: Diagnosis not present

## 2018-01-08 DIAGNOSIS — G309 Alzheimer's disease, unspecified: Secondary | ICD-10-CM | POA: Diagnosis not present

## 2018-01-09 DIAGNOSIS — G309 Alzheimer's disease, unspecified: Secondary | ICD-10-CM | POA: Diagnosis not present

## 2018-01-10 DIAGNOSIS — G309 Alzheimer's disease, unspecified: Secondary | ICD-10-CM | POA: Diagnosis not present

## 2018-01-10 DIAGNOSIS — Z9181 History of falling: Secondary | ICD-10-CM | POA: Diagnosis not present

## 2018-01-10 DIAGNOSIS — R269 Unspecified abnormalities of gait and mobility: Secondary | ICD-10-CM | POA: Diagnosis not present

## 2018-01-10 DIAGNOSIS — Z7901 Long term (current) use of anticoagulants: Secondary | ICD-10-CM | POA: Diagnosis not present

## 2018-01-10 DIAGNOSIS — I2510 Atherosclerotic heart disease of native coronary artery without angina pectoris: Secondary | ICD-10-CM | POA: Diagnosis not present

## 2018-01-10 DIAGNOSIS — I482 Chronic atrial fibrillation: Secondary | ICD-10-CM | POA: Diagnosis not present

## 2018-01-10 DIAGNOSIS — F0391 Unspecified dementia with behavioral disturbance: Secondary | ICD-10-CM | POA: Diagnosis not present

## 2018-01-10 DIAGNOSIS — G3184 Mild cognitive impairment, so stated: Secondary | ICD-10-CM | POA: Diagnosis not present

## 2018-01-11 ENCOUNTER — Telehealth: Payer: Self-pay | Admitting: Cardiovascular Disease

## 2018-01-11 DIAGNOSIS — G309 Alzheimer's disease, unspecified: Secondary | ICD-10-CM | POA: Diagnosis not present

## 2018-01-11 NOTE — Telephone Encounter (Signed)
New message    Patient husband calling  Pt c/o medication issue:  1. Name of Medication: warfarin (COUMADIN) 2.5 MG tablet  2. How are you currently taking this medication (dosage and times per day)? TAKE 1 TABLET BY MOUTH DAILY AS DIRECTED BY COUMADIN CLINIC Patient taking differently: TAKE 1 TABLET BY MOUTH ON SUNDAY,MONDAY,WEDNESDAY,THURSDAY,FRIDAY,AND SATURDAY. TAKE 3MG  ON THURSDAY  3. Are you having a reaction (difficulty breathing--STAT)? No   4. What is your medication issue? Patient at facility in [place] -office put patient on eliquis

## 2018-01-12 DIAGNOSIS — G309 Alzheimer's disease, unspecified: Secondary | ICD-10-CM | POA: Diagnosis not present

## 2018-01-12 NOTE — Telephone Encounter (Signed)
Patient now living in SNF, they switched patient to Eliquis.  Husband concerned because they did not consult Dr. Tresa Endo  Explained to husband that Eliquis is a good choice and should be safer for her than warfarin was.  Answered all of his questions.

## 2018-01-13 DIAGNOSIS — G309 Alzheimer's disease, unspecified: Secondary | ICD-10-CM | POA: Diagnosis not present

## 2018-01-14 DIAGNOSIS — G309 Alzheimer's disease, unspecified: Secondary | ICD-10-CM | POA: Diagnosis not present

## 2018-01-15 DIAGNOSIS — G309 Alzheimer's disease, unspecified: Secondary | ICD-10-CM | POA: Diagnosis not present

## 2018-01-16 DIAGNOSIS — G309 Alzheimer's disease, unspecified: Secondary | ICD-10-CM | POA: Diagnosis not present

## 2018-01-17 DIAGNOSIS — G309 Alzheimer's disease, unspecified: Secondary | ICD-10-CM | POA: Diagnosis not present

## 2018-01-18 DIAGNOSIS — G309 Alzheimer's disease, unspecified: Secondary | ICD-10-CM | POA: Diagnosis not present

## 2018-01-19 DIAGNOSIS — G309 Alzheimer's disease, unspecified: Secondary | ICD-10-CM | POA: Diagnosis not present

## 2018-01-20 DIAGNOSIS — G309 Alzheimer's disease, unspecified: Secondary | ICD-10-CM | POA: Diagnosis not present

## 2018-01-21 DIAGNOSIS — G309 Alzheimer's disease, unspecified: Secondary | ICD-10-CM | POA: Diagnosis not present

## 2018-01-22 DIAGNOSIS — G309 Alzheimer's disease, unspecified: Secondary | ICD-10-CM | POA: Diagnosis not present

## 2018-01-23 DIAGNOSIS — G309 Alzheimer's disease, unspecified: Secondary | ICD-10-CM | POA: Diagnosis not present

## 2018-01-24 DIAGNOSIS — G309 Alzheimer's disease, unspecified: Secondary | ICD-10-CM | POA: Diagnosis not present

## 2018-01-24 DIAGNOSIS — R531 Weakness: Secondary | ICD-10-CM | POA: Diagnosis not present

## 2018-01-25 DIAGNOSIS — Z79899 Other long term (current) drug therapy: Secondary | ICD-10-CM | POA: Diagnosis not present

## 2018-01-25 DIAGNOSIS — G309 Alzheimer's disease, unspecified: Secondary | ICD-10-CM | POA: Diagnosis not present

## 2018-01-25 DIAGNOSIS — R269 Unspecified abnormalities of gait and mobility: Secondary | ICD-10-CM | POA: Diagnosis not present

## 2018-01-25 DIAGNOSIS — G3184 Mild cognitive impairment, so stated: Secondary | ICD-10-CM | POA: Diagnosis not present

## 2018-01-25 DIAGNOSIS — E039 Hypothyroidism, unspecified: Secondary | ICD-10-CM | POA: Diagnosis not present

## 2018-01-25 DIAGNOSIS — E785 Hyperlipidemia, unspecified: Secondary | ICD-10-CM | POA: Diagnosis not present

## 2018-01-25 DIAGNOSIS — I10 Essential (primary) hypertension: Secondary | ICD-10-CM | POA: Diagnosis not present

## 2018-01-25 DIAGNOSIS — F419 Anxiety disorder, unspecified: Secondary | ICD-10-CM | POA: Diagnosis not present

## 2018-01-25 DIAGNOSIS — I482 Chronic atrial fibrillation: Secondary | ICD-10-CM | POA: Diagnosis not present

## 2018-01-25 DIAGNOSIS — I4891 Unspecified atrial fibrillation: Secondary | ICD-10-CM | POA: Diagnosis not present

## 2018-01-26 DIAGNOSIS — G309 Alzheimer's disease, unspecified: Secondary | ICD-10-CM | POA: Diagnosis not present

## 2018-01-27 DIAGNOSIS — G309 Alzheimer's disease, unspecified: Secondary | ICD-10-CM | POA: Diagnosis not present

## 2018-01-28 DIAGNOSIS — G309 Alzheimer's disease, unspecified: Secondary | ICD-10-CM | POA: Diagnosis not present

## 2018-01-29 DIAGNOSIS — G309 Alzheimer's disease, unspecified: Secondary | ICD-10-CM | POA: Diagnosis not present

## 2018-01-30 DIAGNOSIS — G309 Alzheimer's disease, unspecified: Secondary | ICD-10-CM | POA: Diagnosis not present

## 2018-01-31 DIAGNOSIS — I4891 Unspecified atrial fibrillation: Secondary | ICD-10-CM | POA: Diagnosis not present

## 2018-01-31 DIAGNOSIS — B9689 Other specified bacterial agents as the cause of diseases classified elsewhere: Secondary | ICD-10-CM | POA: Diagnosis not present

## 2018-01-31 DIAGNOSIS — G4751 Confusional arousals: Secondary | ICD-10-CM | POA: Diagnosis not present

## 2018-01-31 DIAGNOSIS — F0391 Unspecified dementia with behavioral disturbance: Secondary | ICD-10-CM | POA: Diagnosis not present

## 2018-01-31 DIAGNOSIS — Z7901 Long term (current) use of anticoagulants: Secondary | ICD-10-CM | POA: Diagnosis not present

## 2018-01-31 DIAGNOSIS — K582 Mixed irritable bowel syndrome: Secondary | ICD-10-CM | POA: Diagnosis not present

## 2018-01-31 DIAGNOSIS — K120 Recurrent oral aphthae: Secondary | ICD-10-CM | POA: Diagnosis not present

## 2018-01-31 DIAGNOSIS — G309 Alzheimer's disease, unspecified: Secondary | ICD-10-CM | POA: Diagnosis not present

## 2018-02-01 DIAGNOSIS — G309 Alzheimer's disease, unspecified: Secondary | ICD-10-CM | POA: Diagnosis not present

## 2018-02-02 DIAGNOSIS — I10 Essential (primary) hypertension: Secondary | ICD-10-CM | POA: Diagnosis not present

## 2018-02-02 DIAGNOSIS — G309 Alzheimer's disease, unspecified: Secondary | ICD-10-CM | POA: Diagnosis not present

## 2018-02-02 DIAGNOSIS — F0391 Unspecified dementia with behavioral disturbance: Secondary | ICD-10-CM | POA: Diagnosis not present

## 2018-02-02 DIAGNOSIS — I482 Chronic atrial fibrillation: Secondary | ICD-10-CM | POA: Diagnosis not present

## 2018-02-02 DIAGNOSIS — E785 Hyperlipidemia, unspecified: Secondary | ICD-10-CM | POA: Diagnosis not present

## 2018-02-03 DIAGNOSIS — G309 Alzheimer's disease, unspecified: Secondary | ICD-10-CM | POA: Diagnosis not present

## 2018-02-04 DIAGNOSIS — G309 Alzheimer's disease, unspecified: Secondary | ICD-10-CM | POA: Diagnosis not present

## 2018-02-05 DIAGNOSIS — G309 Alzheimer's disease, unspecified: Secondary | ICD-10-CM | POA: Diagnosis not present

## 2018-02-06 DIAGNOSIS — G309 Alzheimer's disease, unspecified: Secondary | ICD-10-CM | POA: Diagnosis not present

## 2018-02-07 DIAGNOSIS — K580 Irritable bowel syndrome with diarrhea: Secondary | ICD-10-CM | POA: Diagnosis not present

## 2018-02-07 DIAGNOSIS — G309 Alzheimer's disease, unspecified: Secondary | ICD-10-CM | POA: Diagnosis not present

## 2018-02-07 DIAGNOSIS — F0391 Unspecified dementia with behavioral disturbance: Secondary | ICD-10-CM | POA: Diagnosis not present

## 2018-02-07 DIAGNOSIS — E46 Unspecified protein-calorie malnutrition: Secondary | ICD-10-CM | POA: Diagnosis not present

## 2018-02-08 DIAGNOSIS — G309 Alzheimer's disease, unspecified: Secondary | ICD-10-CM | POA: Diagnosis not present

## 2018-02-09 DIAGNOSIS — G309 Alzheimer's disease, unspecified: Secondary | ICD-10-CM | POA: Diagnosis not present

## 2018-02-10 DIAGNOSIS — G309 Alzheimer's disease, unspecified: Secondary | ICD-10-CM | POA: Diagnosis not present

## 2018-02-11 DIAGNOSIS — G309 Alzheimer's disease, unspecified: Secondary | ICD-10-CM | POA: Diagnosis not present

## 2018-02-12 DIAGNOSIS — G309 Alzheimer's disease, unspecified: Secondary | ICD-10-CM | POA: Diagnosis not present

## 2018-02-13 DIAGNOSIS — G309 Alzheimer's disease, unspecified: Secondary | ICD-10-CM | POA: Diagnosis not present

## 2018-02-14 DIAGNOSIS — G309 Alzheimer's disease, unspecified: Secondary | ICD-10-CM | POA: Diagnosis not present

## 2018-02-15 DIAGNOSIS — G309 Alzheimer's disease, unspecified: Secondary | ICD-10-CM | POA: Diagnosis not present

## 2018-02-16 DIAGNOSIS — R509 Fever, unspecified: Secondary | ICD-10-CM | POA: Diagnosis not present

## 2018-02-16 DIAGNOSIS — R05 Cough: Secondary | ICD-10-CM | POA: Diagnosis not present

## 2018-02-16 DIAGNOSIS — G309 Alzheimer's disease, unspecified: Secondary | ICD-10-CM | POA: Diagnosis not present

## 2018-02-16 DIAGNOSIS — R269 Unspecified abnormalities of gait and mobility: Secondary | ICD-10-CM | POA: Diagnosis not present

## 2018-02-16 DIAGNOSIS — R0602 Shortness of breath: Secondary | ICD-10-CM | POA: Diagnosis not present

## 2018-02-16 DIAGNOSIS — R0902 Hypoxemia: Secondary | ICD-10-CM | POA: Diagnosis not present

## 2018-02-16 DIAGNOSIS — I10 Essential (primary) hypertension: Secondary | ICD-10-CM | POA: Diagnosis not present

## 2018-02-16 DIAGNOSIS — J069 Acute upper respiratory infection, unspecified: Secondary | ICD-10-CM | POA: Diagnosis not present

## 2018-02-16 DIAGNOSIS — J449 Chronic obstructive pulmonary disease, unspecified: Secondary | ICD-10-CM | POA: Diagnosis not present

## 2018-02-16 DIAGNOSIS — R5381 Other malaise: Secondary | ICD-10-CM | POA: Diagnosis not present

## 2018-02-17 DIAGNOSIS — G309 Alzheimer's disease, unspecified: Secondary | ICD-10-CM | POA: Diagnosis not present

## 2018-02-18 DIAGNOSIS — G309 Alzheimer's disease, unspecified: Secondary | ICD-10-CM | POA: Diagnosis not present

## 2018-02-19 DIAGNOSIS — G309 Alzheimer's disease, unspecified: Secondary | ICD-10-CM | POA: Diagnosis not present

## 2018-02-20 DIAGNOSIS — G309 Alzheimer's disease, unspecified: Secondary | ICD-10-CM | POA: Diagnosis not present

## 2018-02-21 DIAGNOSIS — J449 Chronic obstructive pulmonary disease, unspecified: Secondary | ICD-10-CM | POA: Diagnosis not present

## 2018-02-21 DIAGNOSIS — F0391 Unspecified dementia with behavioral disturbance: Secondary | ICD-10-CM | POA: Diagnosis not present

## 2018-02-21 DIAGNOSIS — F349 Persistent mood [affective] disorder, unspecified: Secondary | ICD-10-CM | POA: Diagnosis not present

## 2018-02-21 DIAGNOSIS — G309 Alzheimer's disease, unspecified: Secondary | ICD-10-CM | POA: Diagnosis not present

## 2018-02-21 DIAGNOSIS — J209 Acute bronchitis, unspecified: Secondary | ICD-10-CM | POA: Diagnosis not present

## 2018-02-22 DIAGNOSIS — G309 Alzheimer's disease, unspecified: Secondary | ICD-10-CM | POA: Diagnosis not present

## 2018-02-23 DIAGNOSIS — G309 Alzheimer's disease, unspecified: Secondary | ICD-10-CM | POA: Diagnosis not present

## 2018-02-24 DIAGNOSIS — G309 Alzheimer's disease, unspecified: Secondary | ICD-10-CM | POA: Diagnosis not present

## 2018-02-25 DIAGNOSIS — G309 Alzheimer's disease, unspecified: Secondary | ICD-10-CM | POA: Diagnosis not present

## 2018-02-26 DIAGNOSIS — G309 Alzheimer's disease, unspecified: Secondary | ICD-10-CM | POA: Diagnosis not present

## 2018-02-27 DIAGNOSIS — G309 Alzheimer's disease, unspecified: Secondary | ICD-10-CM | POA: Diagnosis not present

## 2018-02-28 DIAGNOSIS — Z9181 History of falling: Secondary | ICD-10-CM | POA: Diagnosis not present

## 2018-02-28 DIAGNOSIS — Z79891 Long term (current) use of opiate analgesic: Secondary | ICD-10-CM | POA: Diagnosis not present

## 2018-02-28 DIAGNOSIS — F0281 Dementia in other diseases classified elsewhere with behavioral disturbance: Secondary | ICD-10-CM | POA: Diagnosis not present

## 2018-02-28 DIAGNOSIS — F418 Other specified anxiety disorders: Secondary | ICD-10-CM | POA: Diagnosis not present

## 2018-02-28 DIAGNOSIS — Z7952 Long term (current) use of systemic steroids: Secondary | ICD-10-CM | POA: Diagnosis not present

## 2018-02-28 DIAGNOSIS — F0390 Unspecified dementia without behavioral disturbance: Secondary | ICD-10-CM | POA: Diagnosis not present

## 2018-02-28 DIAGNOSIS — M25561 Pain in right knee: Secondary | ICD-10-CM | POA: Diagnosis not present

## 2018-02-28 DIAGNOSIS — F321 Major depressive disorder, single episode, moderate: Secondary | ICD-10-CM | POA: Diagnosis not present

## 2018-02-28 DIAGNOSIS — Z7901 Long term (current) use of anticoagulants: Secondary | ICD-10-CM | POA: Diagnosis not present

## 2018-02-28 DIAGNOSIS — F4322 Adjustment disorder with anxiety: Secondary | ICD-10-CM | POA: Diagnosis not present

## 2018-02-28 DIAGNOSIS — G309 Alzheimer's disease, unspecified: Secondary | ICD-10-CM | POA: Diagnosis not present

## 2018-03-01 DIAGNOSIS — G309 Alzheimer's disease, unspecified: Secondary | ICD-10-CM | POA: Diagnosis not present

## 2018-03-02 DIAGNOSIS — G309 Alzheimer's disease, unspecified: Secondary | ICD-10-CM | POA: Diagnosis not present

## 2018-03-02 DIAGNOSIS — Z79891 Long term (current) use of opiate analgesic: Secondary | ICD-10-CM | POA: Diagnosis not present

## 2018-03-02 DIAGNOSIS — F0390 Unspecified dementia without behavioral disturbance: Secondary | ICD-10-CM | POA: Diagnosis not present

## 2018-03-02 DIAGNOSIS — M25561 Pain in right knee: Secondary | ICD-10-CM | POA: Diagnosis not present

## 2018-03-02 DIAGNOSIS — Z7952 Long term (current) use of systemic steroids: Secondary | ICD-10-CM | POA: Diagnosis not present

## 2018-03-02 DIAGNOSIS — R531 Weakness: Secondary | ICD-10-CM | POA: Diagnosis not present

## 2018-03-02 DIAGNOSIS — Z9181 History of falling: Secondary | ICD-10-CM | POA: Diagnosis not present

## 2018-03-02 DIAGNOSIS — Z7901 Long term (current) use of anticoagulants: Secondary | ICD-10-CM | POA: Diagnosis not present

## 2018-03-03 DIAGNOSIS — Z9181 History of falling: Secondary | ICD-10-CM | POA: Diagnosis not present

## 2018-03-03 DIAGNOSIS — Z7901 Long term (current) use of anticoagulants: Secondary | ICD-10-CM | POA: Diagnosis not present

## 2018-03-03 DIAGNOSIS — Z79891 Long term (current) use of opiate analgesic: Secondary | ICD-10-CM | POA: Diagnosis not present

## 2018-03-03 DIAGNOSIS — G309 Alzheimer's disease, unspecified: Secondary | ICD-10-CM | POA: Diagnosis not present

## 2018-03-03 DIAGNOSIS — M25561 Pain in right knee: Secondary | ICD-10-CM | POA: Diagnosis not present

## 2018-03-03 DIAGNOSIS — F0390 Unspecified dementia without behavioral disturbance: Secondary | ICD-10-CM | POA: Diagnosis not present

## 2018-03-03 DIAGNOSIS — Z7952 Long term (current) use of systemic steroids: Secondary | ICD-10-CM | POA: Diagnosis not present

## 2018-03-04 DIAGNOSIS — G309 Alzheimer's disease, unspecified: Secondary | ICD-10-CM | POA: Diagnosis not present

## 2018-03-05 DIAGNOSIS — G309 Alzheimer's disease, unspecified: Secondary | ICD-10-CM | POA: Diagnosis not present

## 2018-03-06 DIAGNOSIS — G309 Alzheimer's disease, unspecified: Secondary | ICD-10-CM | POA: Diagnosis not present

## 2018-03-07 DIAGNOSIS — E038 Other specified hypothyroidism: Secondary | ICD-10-CM | POA: Diagnosis not present

## 2018-03-07 DIAGNOSIS — J441 Chronic obstructive pulmonary disease with (acute) exacerbation: Secondary | ICD-10-CM | POA: Diagnosis not present

## 2018-03-07 DIAGNOSIS — Z7952 Long term (current) use of systemic steroids: Secondary | ICD-10-CM | POA: Diagnosis not present

## 2018-03-07 DIAGNOSIS — R630 Anorexia: Secondary | ICD-10-CM | POA: Diagnosis not present

## 2018-03-07 DIAGNOSIS — G309 Alzheimer's disease, unspecified: Secondary | ICD-10-CM | POA: Diagnosis not present

## 2018-03-07 DIAGNOSIS — M25561 Pain in right knee: Secondary | ICD-10-CM | POA: Diagnosis not present

## 2018-03-07 DIAGNOSIS — J209 Acute bronchitis, unspecified: Secondary | ICD-10-CM | POA: Diagnosis not present

## 2018-03-07 DIAGNOSIS — Z79891 Long term (current) use of opiate analgesic: Secondary | ICD-10-CM | POA: Diagnosis not present

## 2018-03-07 DIAGNOSIS — F0390 Unspecified dementia without behavioral disturbance: Secondary | ICD-10-CM | POA: Diagnosis not present

## 2018-03-07 DIAGNOSIS — Z7901 Long term (current) use of anticoagulants: Secondary | ICD-10-CM | POA: Diagnosis not present

## 2018-03-07 DIAGNOSIS — Z9181 History of falling: Secondary | ICD-10-CM | POA: Diagnosis not present

## 2018-03-07 DIAGNOSIS — K598 Other specified functional intestinal disorders: Secondary | ICD-10-CM | POA: Diagnosis not present

## 2018-03-08 DIAGNOSIS — Z79891 Long term (current) use of opiate analgesic: Secondary | ICD-10-CM | POA: Diagnosis not present

## 2018-03-08 DIAGNOSIS — G309 Alzheimer's disease, unspecified: Secondary | ICD-10-CM | POA: Diagnosis not present

## 2018-03-08 DIAGNOSIS — Z9181 History of falling: Secondary | ICD-10-CM | POA: Diagnosis not present

## 2018-03-08 DIAGNOSIS — Z7901 Long term (current) use of anticoagulants: Secondary | ICD-10-CM | POA: Diagnosis not present

## 2018-03-08 DIAGNOSIS — Z7952 Long term (current) use of systemic steroids: Secondary | ICD-10-CM | POA: Diagnosis not present

## 2018-03-08 DIAGNOSIS — M25561 Pain in right knee: Secondary | ICD-10-CM | POA: Diagnosis not present

## 2018-03-08 DIAGNOSIS — F0390 Unspecified dementia without behavioral disturbance: Secondary | ICD-10-CM | POA: Diagnosis not present

## 2018-03-09 DIAGNOSIS — F0390 Unspecified dementia without behavioral disturbance: Secondary | ICD-10-CM | POA: Diagnosis not present

## 2018-03-09 DIAGNOSIS — Z7952 Long term (current) use of systemic steroids: Secondary | ICD-10-CM | POA: Diagnosis not present

## 2018-03-09 DIAGNOSIS — Z9181 History of falling: Secondary | ICD-10-CM | POA: Diagnosis not present

## 2018-03-09 DIAGNOSIS — M25561 Pain in right knee: Secondary | ICD-10-CM | POA: Diagnosis not present

## 2018-03-09 DIAGNOSIS — G309 Alzheimer's disease, unspecified: Secondary | ICD-10-CM | POA: Diagnosis not present

## 2018-03-09 DIAGNOSIS — Z79891 Long term (current) use of opiate analgesic: Secondary | ICD-10-CM | POA: Diagnosis not present

## 2018-03-09 DIAGNOSIS — Z7901 Long term (current) use of anticoagulants: Secondary | ICD-10-CM | POA: Diagnosis not present

## 2018-03-10 DIAGNOSIS — Z9181 History of falling: Secondary | ICD-10-CM | POA: Diagnosis not present

## 2018-03-10 DIAGNOSIS — F0390 Unspecified dementia without behavioral disturbance: Secondary | ICD-10-CM | POA: Diagnosis not present

## 2018-03-10 DIAGNOSIS — Z79891 Long term (current) use of opiate analgesic: Secondary | ICD-10-CM | POA: Diagnosis not present

## 2018-03-10 DIAGNOSIS — G309 Alzheimer's disease, unspecified: Secondary | ICD-10-CM | POA: Diagnosis not present

## 2018-03-10 DIAGNOSIS — Z7952 Long term (current) use of systemic steroids: Secondary | ICD-10-CM | POA: Diagnosis not present

## 2018-03-10 DIAGNOSIS — M25561 Pain in right knee: Secondary | ICD-10-CM | POA: Diagnosis not present

## 2018-03-10 DIAGNOSIS — Z7901 Long term (current) use of anticoagulants: Secondary | ICD-10-CM | POA: Diagnosis not present

## 2018-03-11 DIAGNOSIS — G309 Alzheimer's disease, unspecified: Secondary | ICD-10-CM | POA: Diagnosis not present

## 2018-03-12 DIAGNOSIS — G309 Alzheimer's disease, unspecified: Secondary | ICD-10-CM | POA: Diagnosis not present

## 2018-03-13 DIAGNOSIS — G309 Alzheimer's disease, unspecified: Secondary | ICD-10-CM | POA: Diagnosis not present

## 2018-03-14 DIAGNOSIS — M25561 Pain in right knee: Secondary | ICD-10-CM | POA: Diagnosis not present

## 2018-03-14 DIAGNOSIS — Z79891 Long term (current) use of opiate analgesic: Secondary | ICD-10-CM | POA: Diagnosis not present

## 2018-03-14 DIAGNOSIS — Z7952 Long term (current) use of systemic steroids: Secondary | ICD-10-CM | POA: Diagnosis not present

## 2018-03-14 DIAGNOSIS — Z7901 Long term (current) use of anticoagulants: Secondary | ICD-10-CM | POA: Diagnosis not present

## 2018-03-14 DIAGNOSIS — G309 Alzheimer's disease, unspecified: Secondary | ICD-10-CM | POA: Diagnosis not present

## 2018-03-14 DIAGNOSIS — F0390 Unspecified dementia without behavioral disturbance: Secondary | ICD-10-CM | POA: Diagnosis not present

## 2018-03-14 DIAGNOSIS — Z9181 History of falling: Secondary | ICD-10-CM | POA: Diagnosis not present

## 2018-03-15 DIAGNOSIS — G309 Alzheimer's disease, unspecified: Secondary | ICD-10-CM | POA: Diagnosis not present

## 2018-03-16 DIAGNOSIS — F0390 Unspecified dementia without behavioral disturbance: Secondary | ICD-10-CM | POA: Diagnosis not present

## 2018-03-16 DIAGNOSIS — Z79891 Long term (current) use of opiate analgesic: Secondary | ICD-10-CM | POA: Diagnosis not present

## 2018-03-16 DIAGNOSIS — Z9181 History of falling: Secondary | ICD-10-CM | POA: Diagnosis not present

## 2018-03-16 DIAGNOSIS — Z7901 Long term (current) use of anticoagulants: Secondary | ICD-10-CM | POA: Diagnosis not present

## 2018-03-16 DIAGNOSIS — G309 Alzheimer's disease, unspecified: Secondary | ICD-10-CM | POA: Diagnosis not present

## 2018-03-16 DIAGNOSIS — Z7952 Long term (current) use of systemic steroids: Secondary | ICD-10-CM | POA: Diagnosis not present

## 2018-03-16 DIAGNOSIS — M25561 Pain in right knee: Secondary | ICD-10-CM | POA: Diagnosis not present

## 2018-03-17 DIAGNOSIS — G309 Alzheimer's disease, unspecified: Secondary | ICD-10-CM | POA: Diagnosis not present

## 2018-03-18 DIAGNOSIS — G309 Alzheimer's disease, unspecified: Secondary | ICD-10-CM | POA: Diagnosis not present

## 2018-03-19 DIAGNOSIS — G309 Alzheimer's disease, unspecified: Secondary | ICD-10-CM | POA: Diagnosis not present

## 2018-03-20 DIAGNOSIS — G309 Alzheimer's disease, unspecified: Secondary | ICD-10-CM | POA: Diagnosis not present

## 2018-03-21 DIAGNOSIS — G309 Alzheimer's disease, unspecified: Secondary | ICD-10-CM | POA: Diagnosis not present

## 2018-03-21 DIAGNOSIS — J441 Chronic obstructive pulmonary disease with (acute) exacerbation: Secondary | ICD-10-CM | POA: Diagnosis not present

## 2018-03-21 DIAGNOSIS — Z9181 History of falling: Secondary | ICD-10-CM | POA: Diagnosis not present

## 2018-03-21 DIAGNOSIS — I482 Chronic atrial fibrillation: Secondary | ICD-10-CM | POA: Diagnosis not present

## 2018-03-21 DIAGNOSIS — Z7952 Long term (current) use of systemic steroids: Secondary | ICD-10-CM | POA: Diagnosis not present

## 2018-03-21 DIAGNOSIS — Z79891 Long term (current) use of opiate analgesic: Secondary | ICD-10-CM | POA: Diagnosis not present

## 2018-03-21 DIAGNOSIS — Z7901 Long term (current) use of anticoagulants: Secondary | ICD-10-CM | POA: Diagnosis not present

## 2018-03-21 DIAGNOSIS — L89321 Pressure ulcer of left buttock, stage 1: Secondary | ICD-10-CM | POA: Diagnosis not present

## 2018-03-21 DIAGNOSIS — F0391 Unspecified dementia with behavioral disturbance: Secondary | ICD-10-CM | POA: Diagnosis not present

## 2018-03-21 DIAGNOSIS — M25561 Pain in right knee: Secondary | ICD-10-CM | POA: Diagnosis not present

## 2018-03-21 DIAGNOSIS — F0390 Unspecified dementia without behavioral disturbance: Secondary | ICD-10-CM | POA: Diagnosis not present

## 2018-03-21 DIAGNOSIS — R634 Abnormal weight loss: Secondary | ICD-10-CM | POA: Diagnosis not present

## 2018-03-22 DIAGNOSIS — G309 Alzheimer's disease, unspecified: Secondary | ICD-10-CM | POA: Diagnosis not present

## 2018-03-23 DIAGNOSIS — Z7901 Long term (current) use of anticoagulants: Secondary | ICD-10-CM | POA: Diagnosis not present

## 2018-03-23 DIAGNOSIS — F0390 Unspecified dementia without behavioral disturbance: Secondary | ICD-10-CM | POA: Diagnosis not present

## 2018-03-23 DIAGNOSIS — G309 Alzheimer's disease, unspecified: Secondary | ICD-10-CM | POA: Diagnosis not present

## 2018-03-23 DIAGNOSIS — Z9181 History of falling: Secondary | ICD-10-CM | POA: Diagnosis not present

## 2018-03-23 DIAGNOSIS — Z79891 Long term (current) use of opiate analgesic: Secondary | ICD-10-CM | POA: Diagnosis not present

## 2018-03-23 DIAGNOSIS — M25561 Pain in right knee: Secondary | ICD-10-CM | POA: Diagnosis not present

## 2018-03-23 DIAGNOSIS — Z7952 Long term (current) use of systemic steroids: Secondary | ICD-10-CM | POA: Diagnosis not present

## 2018-03-24 DIAGNOSIS — M25561 Pain in right knee: Secondary | ICD-10-CM | POA: Diagnosis not present

## 2018-03-24 DIAGNOSIS — Z9181 History of falling: Secondary | ICD-10-CM | POA: Diagnosis not present

## 2018-03-24 DIAGNOSIS — Z7901 Long term (current) use of anticoagulants: Secondary | ICD-10-CM | POA: Diagnosis not present

## 2018-03-24 DIAGNOSIS — F0390 Unspecified dementia without behavioral disturbance: Secondary | ICD-10-CM | POA: Diagnosis not present

## 2018-03-24 DIAGNOSIS — Z79891 Long term (current) use of opiate analgesic: Secondary | ICD-10-CM | POA: Diagnosis not present

## 2018-03-24 DIAGNOSIS — G309 Alzheimer's disease, unspecified: Secondary | ICD-10-CM | POA: Diagnosis not present

## 2018-03-24 DIAGNOSIS — Z7952 Long term (current) use of systemic steroids: Secondary | ICD-10-CM | POA: Diagnosis not present

## 2018-03-25 DIAGNOSIS — G309 Alzheimer's disease, unspecified: Secondary | ICD-10-CM | POA: Diagnosis not present

## 2018-03-26 DIAGNOSIS — G309 Alzheimer's disease, unspecified: Secondary | ICD-10-CM | POA: Diagnosis not present

## 2018-03-27 DIAGNOSIS — G309 Alzheimer's disease, unspecified: Secondary | ICD-10-CM | POA: Diagnosis not present

## 2018-03-28 DIAGNOSIS — G309 Alzheimer's disease, unspecified: Secondary | ICD-10-CM | POA: Diagnosis not present

## 2018-03-28 DIAGNOSIS — F0390 Unspecified dementia without behavioral disturbance: Secondary | ICD-10-CM | POA: Diagnosis not present

## 2018-03-28 DIAGNOSIS — Z7901 Long term (current) use of anticoagulants: Secondary | ICD-10-CM | POA: Diagnosis not present

## 2018-03-28 DIAGNOSIS — M25561 Pain in right knee: Secondary | ICD-10-CM | POA: Diagnosis not present

## 2018-03-28 DIAGNOSIS — Z79891 Long term (current) use of opiate analgesic: Secondary | ICD-10-CM | POA: Diagnosis not present

## 2018-03-28 DIAGNOSIS — Z9181 History of falling: Secondary | ICD-10-CM | POA: Diagnosis not present

## 2018-03-28 DIAGNOSIS — Z7952 Long term (current) use of systemic steroids: Secondary | ICD-10-CM | POA: Diagnosis not present

## 2018-03-29 DIAGNOSIS — G309 Alzheimer's disease, unspecified: Secondary | ICD-10-CM | POA: Diagnosis not present

## 2018-03-30 DIAGNOSIS — M25561 Pain in right knee: Secondary | ICD-10-CM | POA: Diagnosis not present

## 2018-03-30 DIAGNOSIS — Z7901 Long term (current) use of anticoagulants: Secondary | ICD-10-CM | POA: Diagnosis not present

## 2018-03-30 DIAGNOSIS — F0390 Unspecified dementia without behavioral disturbance: Secondary | ICD-10-CM | POA: Diagnosis not present

## 2018-03-30 DIAGNOSIS — Z9181 History of falling: Secondary | ICD-10-CM | POA: Diagnosis not present

## 2018-03-30 DIAGNOSIS — G309 Alzheimer's disease, unspecified: Secondary | ICD-10-CM | POA: Diagnosis not present

## 2018-03-30 DIAGNOSIS — Z7952 Long term (current) use of systemic steroids: Secondary | ICD-10-CM | POA: Diagnosis not present

## 2018-03-30 DIAGNOSIS — Z79891 Long term (current) use of opiate analgesic: Secondary | ICD-10-CM | POA: Diagnosis not present

## 2018-03-31 DIAGNOSIS — G309 Alzheimer's disease, unspecified: Secondary | ICD-10-CM | POA: Diagnosis not present

## 2018-04-01 DIAGNOSIS — G309 Alzheimer's disease, unspecified: Secondary | ICD-10-CM | POA: Diagnosis not present

## 2018-04-02 DIAGNOSIS — G309 Alzheimer's disease, unspecified: Secondary | ICD-10-CM | POA: Diagnosis not present

## 2018-04-03 DIAGNOSIS — G309 Alzheimer's disease, unspecified: Secondary | ICD-10-CM | POA: Diagnosis not present

## 2018-04-04 DIAGNOSIS — F0390 Unspecified dementia without behavioral disturbance: Secondary | ICD-10-CM | POA: Diagnosis not present

## 2018-04-04 DIAGNOSIS — Z7952 Long term (current) use of systemic steroids: Secondary | ICD-10-CM | POA: Diagnosis not present

## 2018-04-04 DIAGNOSIS — F4322 Adjustment disorder with anxiety: Secondary | ICD-10-CM | POA: Diagnosis not present

## 2018-04-04 DIAGNOSIS — G309 Alzheimer's disease, unspecified: Secondary | ICD-10-CM | POA: Diagnosis not present

## 2018-04-04 DIAGNOSIS — F0281 Dementia in other diseases classified elsewhere with behavioral disturbance: Secondary | ICD-10-CM | POA: Diagnosis not present

## 2018-04-04 DIAGNOSIS — F321 Major depressive disorder, single episode, moderate: Secondary | ICD-10-CM | POA: Diagnosis not present

## 2018-04-04 DIAGNOSIS — Z7901 Long term (current) use of anticoagulants: Secondary | ICD-10-CM | POA: Diagnosis not present

## 2018-04-04 DIAGNOSIS — Z9181 History of falling: Secondary | ICD-10-CM | POA: Diagnosis not present

## 2018-04-04 DIAGNOSIS — Z79891 Long term (current) use of opiate analgesic: Secondary | ICD-10-CM | POA: Diagnosis not present

## 2018-04-04 DIAGNOSIS — M25561 Pain in right knee: Secondary | ICD-10-CM | POA: Diagnosis not present

## 2018-04-04 DIAGNOSIS — F418 Other specified anxiety disorders: Secondary | ICD-10-CM | POA: Diagnosis not present

## 2018-04-05 DIAGNOSIS — G309 Alzheimer's disease, unspecified: Secondary | ICD-10-CM | POA: Diagnosis not present

## 2018-04-06 ENCOUNTER — Non-Acute Institutional Stay: Payer: Medicare HMO | Admitting: Hospice and Palliative Medicine

## 2018-04-06 DIAGNOSIS — Z79891 Long term (current) use of opiate analgesic: Secondary | ICD-10-CM | POA: Diagnosis not present

## 2018-04-06 DIAGNOSIS — Z515 Encounter for palliative care: Secondary | ICD-10-CM | POA: Diagnosis not present

## 2018-04-06 DIAGNOSIS — Z7952 Long term (current) use of systemic steroids: Secondary | ICD-10-CM | POA: Diagnosis not present

## 2018-04-06 DIAGNOSIS — G309 Alzheimer's disease, unspecified: Secondary | ICD-10-CM | POA: Diagnosis not present

## 2018-04-06 DIAGNOSIS — M25561 Pain in right knee: Secondary | ICD-10-CM | POA: Diagnosis not present

## 2018-04-06 DIAGNOSIS — F0390 Unspecified dementia without behavioral disturbance: Secondary | ICD-10-CM | POA: Diagnosis not present

## 2018-04-06 DIAGNOSIS — Z9181 History of falling: Secondary | ICD-10-CM | POA: Diagnosis not present

## 2018-04-06 DIAGNOSIS — Z7901 Long term (current) use of anticoagulants: Secondary | ICD-10-CM | POA: Diagnosis not present

## 2018-04-06 NOTE — Progress Notes (Signed)
PALLIATIVE CARE CONSULT VISIT   PATIENT NAME: [name] DOB: 01/19/1930 MRN: [id number]  PRIMARY CARE PROVIDER:   Patient, No Pcp Per  REFERRING PROVIDER:      No referring provider defined for this encounter.  RESPONSIBLE PARTY:   Spouse Marvelyn Bouchillon - [phone]; son Loraine Leriche [phone]   RECOMMENDATIONS and PLAN:  1. FTT - weight loss reported but staff could not tell me how much. They feel her clothes are looser. Reportedly, patient is eating poorly. Agree with increasing oral supplements to TID. Patient may benefit from assistance with meals to encourage eating. She has recently been started on mirtazapine.  2. GOC - I have discussed code status with husband and patient remains a full code. Will readdress as patient declines.   I spent 15 minutes providing this consultation,  from 1200 to 1215. More than 50% of the time in this consultation was spent coordinating communication.   HISTORY OF PRESENT ILLNESS:  [name] is an 84 yo woman with multiple medical problems including CAD, CM with EF 35%, afib on anticoagulant, DM, HTN, h/o CVA, hypothyroidism, COPD, who has had recent progressive weakness and FTT. Patient has been transferred from ALF to memory care due to wandering and progression of dementia. Routine follow up visit made today.   CODE STATUS: FULL CODE  PPS: 40% HOSPICE ELIGIBILITY/DIAGNOSIS: TBD  PAST MEDICAL HISTORY:  Past Medical History:  Diagnosis Date  . A-fib (HCC)   . Anxiety   . Arthritis   . Bronchitis 02/2016  . Coronary artery disease 12/2007  . Diabetes mellitus    x 10 yrs at least  . History of stress test 08/2011   negative for ischemia it was nongated and nondiagnostic and compatible with a possible scar or peri-infraction ischemia.   Marland Kitchen Hx of echocardiogram 09/2012   showed Ef 35-40% with no significant valve disease except for mild to moderate MR. normal RVSP and left atrium was severly dilated.  . Hypertension   . Pacemaker   .  Polio   . Stroke (HCC)   . Thyroid disease    hypothyroidism    SOCIAL HX:  Social History   Tobacco Use  . Smoking status: Never Smoker  . Smokeless tobacco: Never Used  Substance Use Topics  . Alcohol use: No    Alcohol/week: 0.0 oz    ALLERGIES:  Allergies  Allergen Reactions  . Iohexol Other (See Comments)     Desc: CHEST PAIN, LOC AFTER HEART CATH, PT REFUSED DYE   . Penicillins Other (See Comments)    Has patient had a PCN reaction causing immediate rash, facial/tongue/throat swelling, SOB or lightheadedness with hypotension: No Has patient had a PCN reaction causing severe rash involving mucus membranes or skin necrosis: Unknown Has patient had a PCN reaction that required hospitalization: Unknown Has patient had a PCN reaction occurring within the last 10 years: Unknown If all of the above answers are "NO", then may proceed with Cephalosporin use.   . Sulfa Antibiotics Other (See Comments)    Told not to take med     PERTINENT MEDICATIONS:  Outpatient Encounter Medications as of 04/06/2018  Medication Sig  . ALPRAZolam (XANAX) 0.25 MG tablet Take 1 tablet (0.25 mg total) by mouth 2 (two) times daily. (Patient taking differently: Take 0.5 mg by mouth 2 (two) times daily. )  . Amino Acids-Protein Hydrolys (FEEDING SUPPLEMENT, PRO-STAT SUGAR FREE 64,) LIQD Take 30 mLs by mouth daily.  . benzonatate (TESSALON)  100 MG capsule Take 100 mg by mouth 3 (three) times daily as needed for cough.  . budesonide-formoterol (SYMBICORT) 160-4.5 MCG/ACT inhaler Inhale 2 puffs into the lungs 2 (two) times daily.  . digoxin (DIGOX) 0.125 MG tablet Take 1 tablet (125 mcg total) by mouth daily. Patient needs to call our office to schedule an appointment with Dr Tresa Endo for future refills.  . diphenoxylate-atropine (LOMOTIL) 2.5-0.025 MG tablet Take 1 tablet by mouth daily as needed for diarrhea or loose stools.  . divalproex (DEPAKOTE SPRINKLE) 125 MG capsule Take 125 mg by mouth 2 (two) times  daily.  Marland Kitchen donepezil (ARICEPT) 5 MG tablet Take 5 mg by mouth at bedtime.  Marland Kitchen FLUoxetine (PROZAC) 20 MG capsule Take 20 mg by mouth daily.  Marland Kitchen guaiFENesin-dextromethorphan (ROBITUSSIN DM) 100-10 MG/5ML syrup Take 10 mLs by mouth every 6 (six) hours. (Patient not taking: Reported on 11/18/2017)  . isosorbide mononitrate (IMDUR) 30 MG 24 hr tablet Take 1 tablet (30 mg total) by mouth daily. Patient needs to call our office to schedule an appointment with Dr Tresa Endo for future refills. (Patient taking differently: Take 30 mg by mouth daily. )  . levothyroxine (SYNTHROID, LEVOTHROID) 100 MCG tablet TAKE 1 TABLET BY MOUTH EVERY DAY  . loperamide (IMODIUM) 2 MG capsule Take 2 mg by mouth daily as needed for diarrhea or loose stools.   . Melatonin 1 MG TABS Take 1 tablet by mouth at bedtime.   . memantine (NAMENDA) 5 MG tablet Take 5 mg by mouth 2 (two) times daily.  . metoprolol (LOPRESSOR) 50 MG tablet Take 1 tablet (50 mg total) by mouth twice daily. Patient needs to call our office to schedule an appointment with Dr Tresa Endo for refills. (Patient taking differently: Take 25 mg by mouth 2 (two) times daily. )  . NONFORMULARY OR COMPOUNDED ITEM Estradiol 0.02 % 65ml prefilled applicator Sig: apply twice a week (Patient not taking: Reported on 11/18/2017)  . pravastatin (PRAVACHOL) 20 MG tablet Take 20 mg by mouth daily.  . Probiotic Product (ALIGN) 4 MG CAPS Take 1 capsule by mouth daily.  . PSYLLIUM FIBER PO Take 2 capsules by mouth daily.  . traMADol (ULTRAM) 50 MG tablet Take 25 mg by mouth daily as needed.   . warfarin (COUMADIN) 2.5 MG tablet TAKE 1 TABLET BY MOUTH DAILY AS DIRECTED BY COUMADIN CLINIC (Patient taking differently: TAKE 1 TABLET BY MOUTH ON SUNDAY,MONDAY,WEDNESDAY,THURSDAY,FRIDAY,AND SATURDAY. TAKE 3MG  ON THURSDAY)   No facility-administered encounter medications on file as of 04/06/2018.     PHYSICAL EXAM:   General: NAD, frail appearing, thin, ambulatory in the halls Pulmonary:  unlabored Extremities: no edema, no joint deformities Skin: no rashes Neurological: Weakness, confusion  Malachy Moan, NP

## 2018-04-07 DIAGNOSIS — G309 Alzheimer's disease, unspecified: Secondary | ICD-10-CM | POA: Diagnosis not present

## 2018-04-08 DIAGNOSIS — G309 Alzheimer's disease, unspecified: Secondary | ICD-10-CM | POA: Diagnosis not present

## 2018-04-09 DIAGNOSIS — G309 Alzheimer's disease, unspecified: Secondary | ICD-10-CM | POA: Diagnosis not present

## 2018-04-10 DIAGNOSIS — F0391 Unspecified dementia with behavioral disturbance: Secondary | ICD-10-CM | POA: Diagnosis not present

## 2018-04-10 DIAGNOSIS — Z7901 Long term (current) use of anticoagulants: Secondary | ICD-10-CM | POA: Diagnosis not present

## 2018-04-10 DIAGNOSIS — Z79899 Other long term (current) drug therapy: Secondary | ICD-10-CM | POA: Diagnosis not present

## 2018-04-10 DIAGNOSIS — E7849 Other hyperlipidemia: Secondary | ICD-10-CM | POA: Diagnosis not present

## 2018-04-10 DIAGNOSIS — G309 Alzheimer's disease, unspecified: Secondary | ICD-10-CM | POA: Diagnosis not present

## 2018-04-10 DIAGNOSIS — I482 Chronic atrial fibrillation: Secondary | ICD-10-CM | POA: Diagnosis not present

## 2018-04-10 DIAGNOSIS — F0630 Mood disorder due to known physiological condition, unspecified: Secondary | ICD-10-CM | POA: Diagnosis not present

## 2018-04-10 DIAGNOSIS — I10 Essential (primary) hypertension: Secondary | ICD-10-CM | POA: Diagnosis not present

## 2018-04-11 DIAGNOSIS — M25561 Pain in right knee: Secondary | ICD-10-CM | POA: Diagnosis not present

## 2018-04-11 DIAGNOSIS — F0390 Unspecified dementia without behavioral disturbance: Secondary | ICD-10-CM | POA: Diagnosis not present

## 2018-04-11 DIAGNOSIS — Z79891 Long term (current) use of opiate analgesic: Secondary | ICD-10-CM | POA: Diagnosis not present

## 2018-04-11 DIAGNOSIS — Z7901 Long term (current) use of anticoagulants: Secondary | ICD-10-CM | POA: Diagnosis not present

## 2018-04-11 DIAGNOSIS — Z7952 Long term (current) use of systemic steroids: Secondary | ICD-10-CM | POA: Diagnosis not present

## 2018-04-11 DIAGNOSIS — Z9181 History of falling: Secondary | ICD-10-CM | POA: Diagnosis not present

## 2018-04-11 DIAGNOSIS — G309 Alzheimer's disease, unspecified: Secondary | ICD-10-CM | POA: Diagnosis not present

## 2018-04-12 DIAGNOSIS — G309 Alzheimer's disease, unspecified: Secondary | ICD-10-CM | POA: Diagnosis not present

## 2018-04-13 DIAGNOSIS — G309 Alzheimer's disease, unspecified: Secondary | ICD-10-CM | POA: Diagnosis not present

## 2018-04-13 DIAGNOSIS — Z9181 History of falling: Secondary | ICD-10-CM | POA: Diagnosis not present

## 2018-04-13 DIAGNOSIS — F0390 Unspecified dementia without behavioral disturbance: Secondary | ICD-10-CM | POA: Diagnosis not present

## 2018-04-13 DIAGNOSIS — Z7901 Long term (current) use of anticoagulants: Secondary | ICD-10-CM | POA: Diagnosis not present

## 2018-04-13 DIAGNOSIS — M25561 Pain in right knee: Secondary | ICD-10-CM | POA: Diagnosis not present

## 2018-04-13 DIAGNOSIS — Z7952 Long term (current) use of systemic steroids: Secondary | ICD-10-CM | POA: Diagnosis not present

## 2018-04-13 DIAGNOSIS — Z79891 Long term (current) use of opiate analgesic: Secondary | ICD-10-CM | POA: Diagnosis not present

## 2018-04-14 ENCOUNTER — Ambulatory Visit (INDEPENDENT_AMBULATORY_CARE_PROVIDER_SITE_OTHER): Payer: Medicare HMO | Admitting: Pulmonary Disease

## 2018-04-14 ENCOUNTER — Encounter: Payer: Self-pay | Admitting: Pulmonary Disease

## 2018-04-14 DIAGNOSIS — J9611 Chronic respiratory failure with hypoxia: Secondary | ICD-10-CM

## 2018-04-14 DIAGNOSIS — J449 Chronic obstructive pulmonary disease, unspecified: Secondary | ICD-10-CM | POA: Diagnosis not present

## 2018-04-14 DIAGNOSIS — Z9181 History of falling: Secondary | ICD-10-CM | POA: Diagnosis not present

## 2018-04-14 DIAGNOSIS — G309 Alzheimer's disease, unspecified: Secondary | ICD-10-CM | POA: Diagnosis not present

## 2018-04-14 NOTE — Progress Notes (Signed)
@Patient  ID: [name], female    DOB: 10-27-1930, 84 y.o.   MRN: [id number]  Chief Complaint  Patient presents with  . Follow-up    Copd-cough    Referring provider: Juliette Alcide, MD  HPI: 84 year old patient here for COPD follow-up Past medical history significant for CAD, chronic A. fib, Coumadin  ROV 82/3/17 Patient saw Tammy Parrett in May as a follow-up and was noted to be doing better. She was restarted on Symbicort. PFT shows moderate obstructive ventilatory defect. She stopped Symbicort since it was not making any difference. Currently, her dyspnea is better. She gets more winded with more than ADLs but does not happen often. Cough is significantly improved. Has not been admitted nor been on antibiotics since last seen. She uses oxygen, 2 L 24 7. Feels better using it.  ROV: 01/11/2017 Follow up : Chronic Asthma /O2 RF  Patient presents for a four-month follow-up. Patient says overall that she has been doing okay with her breathing. Remains on Symbicort twice daily. She is accompanied by her husband .  No flare of cough or wheezing .   ROV 04/14/2017 Patient returns to the office as follow-up on her COPD. Since last seen, she states, she has been doing fair. According to the husband, her Alzheimer has been getting worse. She had to be placed in an assisted living facility. Facility has been giving her Symbicort, 2 puffs twice a day. She has not been using her oxygen. Her O2 saturation was more than 90% with exertion. She also has issues using oxygen at bedtime. Half the time, when she wakes up, her nasal cannulae is off. Continue Symbicort rinse mouth after use continue albuterol  Tests:  Pulmonary function test in May 2017 FEV1 of 70%, ratio 68, FVC 77%, minimum bronchodilator  response at 8%. Mid flows with a positive bronchodilator response. DLCO 33%.  01/05/2018 CT head without contrast no acute intracranial abnormalities, chronic small vessel ischemic changes 07/20/2017  chest x-ray-no acute cardiopulmonary abnormality seen  03/02/16-CT chest without contrast- lungs are clear mucus plugging and lower lobe bronchi seen bilaterally, cardiomegaly  03/03/2016-echocardiogram-LV ejection fraction 55 to 60%,  Chart review reveals to recent admissions to the emergency room  December 2018 - Fall   February 2019- Fall / UTI  concern for unwitnessed fall. Patient is coming from [facility].  Treated for UTI    04/14/2017 Office Visit  She reports the office today with spouse.  Reporting that breathing has been going well.  Spouse reports a vague cough 2 months ago but that has since resolved.  2 months ago spouse said that oxygen saturations when checked were 85 to 86%, when home health assessed they were 92%.  Patient lives at [facility] and primary care is doctors making house calls.  Chart review reveals to ER admissions from December 2018 and February 2019 both from falls.  Patient spouse states that they plan to review medications with their primary care to ensure that the occasions are not contributing to her recent falls.  Spouse states that [facility] has been giving the Symbicort as prescribed.  He is unsure about Combivent use.  Spouse states that patient's weight dropped 2 months ago 110 pounds but is 132 pounds today.  Patient spouse also brings form to office today for medical release.  Discussed with patient spouse they need to take this to medical records which is in the [facility] building.  *HPI/ROS is limited based on patient's limited cognitive function  and spouse's input   Allergies  Allergen Reactions  . Iohexol Other (See Comments)     Desc: CHEST PAIN, LOC AFTER HEART CATH, PT REFUSED DYE   . Penicillins Other (See Comments)    Has patient had a PCN reaction causing immediate rash, facial/tongue/throat swelling, SOB or lightheadedness with hypotension: No Has patient had a PCN reaction causing severe rash involving mucus membranes or  skin necrosis: Unknown Has patient had a PCN reaction that required hospitalization: Unknown Has patient had a PCN reaction occurring within the last 10 years: Unknown If all of the above answers are "NO", then may proceed with Cephalosporin use.   . Sulfa Antibiotics Other (See Comments)    Told not to take med    Immunization History  Administered Date(s) Administered  . Influenza,inj,Quad PF,6+ Mos 11/06/2015, 09/28/2016    Past Medical History:  Diagnosis Date  . A-fib (HCC)   . Anxiety   . Arthritis   . Bronchitis 02/2016  . Coronary artery disease 12/2007  . Diabetes mellitus    x 10 yrs at least  . History of stress test 08/2011   negative for ischemia it was nongated and nondiagnostic and compatible with a possible scar or peri-infraction ischemia.   Marland Kitchen Hx of echocardiogram 09/2012   showed Ef 35-40% with no significant valve disease except for mild to moderate MR. normal RVSP and left atrium was severly dilated.  . Hypertension   . Pacemaker   . Polio   . Stroke (HCC)   . Thyroid disease    hypothyroidism    Tobacco History: Social History   Tobacco Use  Smoking Status Never Smoker  Smokeless Tobacco Never Used   Counseling given: Not Answered   84outpatient Encounter Medications as of 04/14/2018  Medication Sig  . ALPRAZolam (XANAX) 0.25 MG tablet Take 1 tablet (0.25 mg total) by mouth 2 (two) times daily. (Patient taking differently: Take 0.5 mg by mouth 2 (two) times daily. )  . Amino Acids-Protein Hydrolys (FEEDING SUPPLEMENT, PRO-STAT SUGAR FREE 64,) LIQD Take 30 mLs by mouth daily.  Marland Kitchen apixaban (ELIQUIS) 2.5 MG TABS tablet Take 2.5 mg by mouth 2 (two) times daily.  . benzonatate (TESSALON) 100 MG capsule Take 100 mg by mouth 3 (three) times daily as needed for cough.  . budesonide-formoterol (SYMBICORT) 160-4.5 MCG/ACT inhaler Inhale 2 puffs into the lungs 2 (two) times daily.  Marland Kitchen dextromethorphan-guaiFENesin (MUCINEX DM) 30-600 MG 12hr tablet Take 1 tablet  by mouth 2 (two) times daily.  . digoxin (DIGOX) 0.125 MG tablet Take 1 tablet (125 mcg total) by mouth daily. Patient needs to call our office to schedule an appointment with Dr Tresa Endo for future refills.  . diphenoxylate-atropine (LOMOTIL) 2.5-0.025 MG tablet Take 1 tablet by mouth daily as needed for diarrhea or loose stools.  . divalproex (DEPAKOTE SPRINKLE) 125 MG capsule Take 125 mg by mouth 2 (two) times daily.  Marland Kitchen donepezil (ARICEPT) 5 MG tablet Take 5 mg by mouth at bedtime.  Marland Kitchen FLUoxetine (PROZAC) 20 MG capsule Take 20 mg by mouth daily.  . Ipratropium-Albuterol (COMBIVENT RESPIMAT) 20-100 MCG/ACT AERS respimat Inhale 1 puff into the lungs every 6 (six) hours.  . isosorbide mononitrate (IMDUR) 30 MG 24 hr tablet Take 1 tablet (30 mg total) by mouth daily. Patient needs to call our office to schedule an appointment with Dr Tresa Endo for future refills. (Patient taking differently: Take 30 mg by mouth daily. )  . levothyroxine (SYNTHROID, LEVOTHROID) 100 MCG tablet TAKE 1 TABLET  BY MOUTH EVERY DAY  . loperamide (IMODIUM) 2 MG capsule Take 2 mg by mouth daily as needed for diarrhea or loose stools.   . Melatonin 1 MG TABS Take 1 tablet by mouth at bedtime.   . memantine (NAMENDA) 5 MG tablet Take 5 mg by mouth 2 (two) times daily.  . metoprolol (LOPRESSOR) 50 MG tablet Take 1 tablet (50 mg total) by mouth twice daily. Patient needs to call our office to schedule an appointment with Dr Tresa Endo for refills. (Patient taking differently: Take 25 mg by mouth 2 (two) times daily. )  . mirtazapine (REMERON) 7.5 MG tablet Take 7.5 mg by mouth at bedtime.  . NONFORMULARY OR COMPOUNDED ITEM Estradiol 0.02 % 1ml prefilled applicator Sig: apply twice a week  . pravastatin (PRAVACHOL) 20 MG tablet Take 20 mg by mouth daily.  . Probiotic Product (ALIGN) 4 MG CAPS Take 1 capsule by mouth daily.  . PSYLLIUM FIBER PO Take 2 capsules by mouth daily.  . traMADol (ULTRAM) 50 MG tablet Take 25 mg by mouth daily as  needed.   Marland Kitchen guaiFENesin-dextromethorphan (ROBITUSSIN DM) 100-10 MG/5ML syrup Take 10 mLs by mouth every 6 (six) hours. (Patient not taking: Reported on 04/14/2018)  . warfarin (COUMADIN) 2.5 MG tablet TAKE 1 TABLET BY MOUTH DAILY AS DIRECTED BY COUMADIN CLINIC (Patient not taking: Reported on 04/14/2018)   No facility-administered encounter medications on file as of 04/14/2018.      Review of Systems  Constitutional:  +weight loss - since resolved, No night sweats,  Fevers, chills, fatigue, or  lassitude.  HEENT:   No headaches,  Difficulty swallowing,  Tooth/dental problems, or  Sore throat, No sneezing, itching, ear ache, nasal congestion, post nasal drip   CV:  No chest pain,  Orthopnea, PND, swelling in lower extremities, anasarca, dizziness, palpitations, syncope.   GI  No heartburn, indigestion, abdominal pain, nausea, vomiting, diarrhea, change in bowel habits, loss of appetite, bloody stools.   Resp: +71mo ago cough- since resolved, not present today- resolved, No shortness of breath with exertion or at rest.  No excess mucus, no productive cough,  No non-productive cough,  No coughing up of blood.  No change in color of mucus.  No wheezing.  No chest wall deformity   Skin: no rash or lesions.  GU: no dysuria, change in color of urine, no urgency or frequency.  No flank pain, no hematuria   MS:  No joint pain or swelling.  No decreased range of motion.  No back pain.    Physical Exam  BP (!) 94/52 (BP Location: Left Arm, Cuff Size: Normal)   Pulse 60   Ht 5\' 9"  (1.753 m)   Wt 132 lb (59.9 kg)   SpO2 97%   BMI 19.49 kg/m   GEN: limited cognitive function; pleasant , NAD, chronically ill appearing   HEENT:  [place]/AT,  EACs-clear, moderate cerumen in Right ear canal, TMs-wnl, NOSE-clear, THROAT-clear, no lesions, no postnasal drip or exudate noted.   NECK:  Supple w/ fair ROM; no JVD; no thyromegaly or nodules palpated; no lymphadenopathy.    RESP  Clear  P & A; w/o,  wheezes/ rales/ or rhonchi. no accessory muscle use, no dullness to percussion  CARD:  RRR, no m/r/g, no peripheral edema, pulses intact, no cyanosis or clubbing.  GI:   Soft & nt; nml bowel sounds; no organomegaly or masses detected.   Musco: Warm bil, no deformities or joint swelling noted.   Neuro: alert, oriented to  time, place, person, occasionally confused or innapropriate comments during exam, does well with redirection   Skin: Warm, no lesions or rashes    Lab Results:  CBC    Component Value Date/Time   WBC 9.3 01/05/2018 1028   RBC 4.01 01/05/2018 1028   HGB 11.9 (L) 01/05/2018 1028   HCT 36.5 01/05/2018 1028   PLT 188 01/05/2018 1028   MCV 91.1 01/05/2018 1028   MCH 29.6 01/05/2018 1028   MCHC 32.5 01/05/2018 1028   RDW 15.3 (H) 01/05/2018 1028   LYMPHSABS 1.0 01/05/2018 1028   MONOABS 1.1 (H) 01/05/2018 1028   EOSABS 0.1 01/05/2018 1028   BASOSABS 0.1 01/05/2018 1028    BMET    Component Value Date/Time   NA 140 01/05/2018 1028   K 4.0 01/05/2018 1028   CL 101 01/05/2018 1028   CO2 31 01/05/2018 1028   GLUCOSE 111 (H) 01/05/2018 1028   BUN 19 01/05/2018 1028   CREATININE 0.60 01/05/2018 1028   CREATININE 0.58 02/14/2014 1502   CALCIUM 8.7 (L) 01/05/2018 1028   GFRNONAA >60 01/05/2018 1028   GFRAA >60 01/05/2018 1028    BNP    Component Value Date/Time   BNP 40.9 03/01/2016 2255    ProBNP No results found for: PROBNP  Imaging: No results found.   Assessment & Plan:   COPD (chronic obstructive pulmonary disease) (HCC) Continue Symbicort Continue Combivent Spouse to follow-up with [facility] to ensure medication compliance Call office with any respiratory or breathing changes Follow-up in 6 months   Chronic respiratory failure (HCC) Continue Symbicort Continue Combivent Follow-up if any respiratory changes occur 54-month follow-up  Risk for falls  Spouse to review all medications with doctors making house calls Spouse to  call doctors making house calls if a fall occurs Educated on the importance of limiting falls and close follow-up with providers if a fall happens     Coral Ceo, NP 04/14/2018

## 2018-04-14 NOTE — Patient Instructions (Addendum)
Continue follow-up with doctors making house calls Review list of medications with doctors making house calls to lower fall risk Continue Symbicort >>> Patient spouse to call office /or call pharmacy if out of Symbicort refills >>> Please notify office if ever out of Symbicort and unable to get it refilled  Follow up in 6 months or sooner if symptoms worsen  Please take patient request for access form to the: >>>[facility] building - Medical records  >>>[address] [facility]    [place], [place]      -  [place] [phone]    Please contact the office if your symptoms worsen or you have concerns that you are not improving.    Thank you for choosing [facility] for your healthcare, and for allowing [place] to partner with you on your healthcare journey. I am thankful to be able to provide care to you today.   Elisha Headland FNP-C

## 2018-04-14 NOTE — Assessment & Plan Note (Signed)
Continue Symbicort Continue Combivent Follow-up if any respiratory changes occur 13-month follow-up

## 2018-04-14 NOTE — Assessment & Plan Note (Signed)
Continue Symbicort Continue Combivent Spouse to follow-up with [facility] to ensure medication compliance Call office with any respiratory or breathing changes Follow-up in 6 months

## 2018-04-15 DIAGNOSIS — G309 Alzheimer's disease, unspecified: Secondary | ICD-10-CM | POA: Diagnosis not present

## 2018-04-16 DIAGNOSIS — G309 Alzheimer's disease, unspecified: Secondary | ICD-10-CM | POA: Diagnosis not present

## 2018-04-17 DIAGNOSIS — G309 Alzheimer's disease, unspecified: Secondary | ICD-10-CM | POA: Diagnosis not present

## 2018-04-18 DIAGNOSIS — Z7952 Long term (current) use of systemic steroids: Secondary | ICD-10-CM | POA: Diagnosis not present

## 2018-04-18 DIAGNOSIS — F0390 Unspecified dementia without behavioral disturbance: Secondary | ICD-10-CM | POA: Diagnosis not present

## 2018-04-18 DIAGNOSIS — Z9181 History of falling: Secondary | ICD-10-CM | POA: Diagnosis not present

## 2018-04-18 DIAGNOSIS — G309 Alzheimer's disease, unspecified: Secondary | ICD-10-CM | POA: Diagnosis not present

## 2018-04-18 DIAGNOSIS — Z7901 Long term (current) use of anticoagulants: Secondary | ICD-10-CM | POA: Diagnosis not present

## 2018-04-18 DIAGNOSIS — M25561 Pain in right knee: Secondary | ICD-10-CM | POA: Diagnosis not present

## 2018-04-18 DIAGNOSIS — Z79891 Long term (current) use of opiate analgesic: Secondary | ICD-10-CM | POA: Diagnosis not present

## 2018-04-19 DIAGNOSIS — I10 Essential (primary) hypertension: Secondary | ICD-10-CM | POA: Diagnosis not present

## 2018-04-19 DIAGNOSIS — F0391 Unspecified dementia with behavioral disturbance: Secondary | ICD-10-CM | POA: Diagnosis not present

## 2018-04-19 DIAGNOSIS — E7849 Other hyperlipidemia: Secondary | ICD-10-CM | POA: Diagnosis not present

## 2018-04-19 DIAGNOSIS — Z79891 Long term (current) use of opiate analgesic: Secondary | ICD-10-CM | POA: Diagnosis not present

## 2018-04-19 DIAGNOSIS — I482 Chronic atrial fibrillation: Secondary | ICD-10-CM | POA: Diagnosis not present

## 2018-04-19 DIAGNOSIS — F0390 Unspecified dementia without behavioral disturbance: Secondary | ICD-10-CM | POA: Diagnosis not present

## 2018-04-19 DIAGNOSIS — Z9181 History of falling: Secondary | ICD-10-CM | POA: Diagnosis not present

## 2018-04-19 DIAGNOSIS — Z79899 Other long term (current) drug therapy: Secondary | ICD-10-CM | POA: Diagnosis not present

## 2018-04-19 DIAGNOSIS — M25561 Pain in right knee: Secondary | ICD-10-CM | POA: Diagnosis not present

## 2018-04-19 DIAGNOSIS — Z7952 Long term (current) use of systemic steroids: Secondary | ICD-10-CM | POA: Diagnosis not present

## 2018-04-19 DIAGNOSIS — R609 Edema, unspecified: Secondary | ICD-10-CM | POA: Diagnosis not present

## 2018-04-19 DIAGNOSIS — G309 Alzheimer's disease, unspecified: Secondary | ICD-10-CM | POA: Diagnosis not present

## 2018-04-19 DIAGNOSIS — Z7901 Long term (current) use of anticoagulants: Secondary | ICD-10-CM | POA: Diagnosis not present

## 2018-04-19 DIAGNOSIS — F0630 Mood disorder due to known physiological condition, unspecified: Secondary | ICD-10-CM | POA: Diagnosis not present

## 2018-04-20 DIAGNOSIS — G309 Alzheimer's disease, unspecified: Secondary | ICD-10-CM | POA: Diagnosis not present

## 2018-04-21 DIAGNOSIS — G309 Alzheimer's disease, unspecified: Secondary | ICD-10-CM | POA: Diagnosis not present

## 2018-04-22 DIAGNOSIS — G309 Alzheimer's disease, unspecified: Secondary | ICD-10-CM | POA: Diagnosis not present

## 2018-04-23 DIAGNOSIS — G309 Alzheimer's disease, unspecified: Secondary | ICD-10-CM | POA: Diagnosis not present

## 2018-04-24 DIAGNOSIS — G309 Alzheimer's disease, unspecified: Secondary | ICD-10-CM | POA: Diagnosis not present

## 2018-04-25 DIAGNOSIS — G309 Alzheimer's disease, unspecified: Secondary | ICD-10-CM | POA: Diagnosis not present

## 2018-04-26 DIAGNOSIS — G309 Alzheimer's disease, unspecified: Secondary | ICD-10-CM | POA: Diagnosis not present

## 2018-04-27 DIAGNOSIS — G309 Alzheimer's disease, unspecified: Secondary | ICD-10-CM | POA: Diagnosis not present

## 2018-04-28 DIAGNOSIS — G309 Alzheimer's disease, unspecified: Secondary | ICD-10-CM | POA: Diagnosis not present

## 2018-04-29 DIAGNOSIS — G309 Alzheimer's disease, unspecified: Secondary | ICD-10-CM | POA: Diagnosis not present

## 2018-04-30 DIAGNOSIS — G309 Alzheimer's disease, unspecified: Secondary | ICD-10-CM | POA: Diagnosis not present

## 2018-05-01 DIAGNOSIS — G309 Alzheimer's disease, unspecified: Secondary | ICD-10-CM | POA: Diagnosis not present

## 2018-05-02 DIAGNOSIS — G309 Alzheimer's disease, unspecified: Secondary | ICD-10-CM | POA: Diagnosis not present

## 2018-05-03 DIAGNOSIS — G309 Alzheimer's disease, unspecified: Secondary | ICD-10-CM | POA: Diagnosis not present

## 2018-05-04 DIAGNOSIS — G309 Alzheimer's disease, unspecified: Secondary | ICD-10-CM | POA: Diagnosis not present

## 2018-05-04 DIAGNOSIS — F4322 Adjustment disorder with anxiety: Secondary | ICD-10-CM | POA: Diagnosis not present

## 2018-05-04 DIAGNOSIS — F418 Other specified anxiety disorders: Secondary | ICD-10-CM | POA: Diagnosis not present

## 2018-05-04 DIAGNOSIS — F321 Major depressive disorder, single episode, moderate: Secondary | ICD-10-CM | POA: Diagnosis not present

## 2018-05-04 DIAGNOSIS — F0281 Dementia in other diseases classified elsewhere with behavioral disturbance: Secondary | ICD-10-CM | POA: Diagnosis not present

## 2018-05-05 DIAGNOSIS — G309 Alzheimer's disease, unspecified: Secondary | ICD-10-CM | POA: Diagnosis not present

## 2018-05-06 DIAGNOSIS — G309 Alzheimer's disease, unspecified: Secondary | ICD-10-CM | POA: Diagnosis not present

## 2018-05-07 DIAGNOSIS — G309 Alzheimer's disease, unspecified: Secondary | ICD-10-CM | POA: Diagnosis not present

## 2018-05-08 DIAGNOSIS — G309 Alzheimer's disease, unspecified: Secondary | ICD-10-CM | POA: Diagnosis not present

## 2018-05-09 DIAGNOSIS — G309 Alzheimer's disease, unspecified: Secondary | ICD-10-CM | POA: Diagnosis not present

## 2018-05-09 DIAGNOSIS — Z87440 Personal history of urinary (tract) infections: Secondary | ICD-10-CM | POA: Diagnosis not present

## 2018-05-09 DIAGNOSIS — K580 Irritable bowel syndrome with diarrhea: Secondary | ICD-10-CM | POA: Diagnosis not present

## 2018-05-09 DIAGNOSIS — R350 Frequency of micturition: Secondary | ICD-10-CM | POA: Diagnosis not present

## 2018-05-09 DIAGNOSIS — K5900 Constipation, unspecified: Secondary | ICD-10-CM | POA: Diagnosis not present

## 2018-05-09 DIAGNOSIS — F0391 Unspecified dementia with behavioral disturbance: Secondary | ICD-10-CM | POA: Diagnosis not present

## 2018-05-10 DIAGNOSIS — G309 Alzheimer's disease, unspecified: Secondary | ICD-10-CM | POA: Diagnosis not present

## 2018-05-11 DIAGNOSIS — F419 Anxiety disorder, unspecified: Secondary | ICD-10-CM | POA: Diagnosis not present

## 2018-05-11 DIAGNOSIS — G309 Alzheimer's disease, unspecified: Secondary | ICD-10-CM | POA: Diagnosis not present

## 2018-05-11 DIAGNOSIS — I482 Chronic atrial fibrillation: Secondary | ICD-10-CM | POA: Diagnosis not present

## 2018-05-11 DIAGNOSIS — I10 Essential (primary) hypertension: Secondary | ICD-10-CM | POA: Diagnosis not present

## 2018-05-11 DIAGNOSIS — G3184 Mild cognitive impairment, so stated: Secondary | ICD-10-CM | POA: Diagnosis not present

## 2018-05-11 DIAGNOSIS — Z7901 Long term (current) use of anticoagulants: Secondary | ICD-10-CM | POA: Diagnosis not present

## 2018-05-12 DIAGNOSIS — G309 Alzheimer's disease, unspecified: Secondary | ICD-10-CM | POA: Diagnosis not present

## 2018-05-13 DIAGNOSIS — G309 Alzheimer's disease, unspecified: Secondary | ICD-10-CM | POA: Diagnosis not present

## 2018-05-14 DIAGNOSIS — G309 Alzheimer's disease, unspecified: Secondary | ICD-10-CM | POA: Diagnosis not present

## 2018-05-15 DIAGNOSIS — G309 Alzheimer's disease, unspecified: Secondary | ICD-10-CM | POA: Diagnosis not present

## 2018-05-16 DIAGNOSIS — G309 Alzheimer's disease, unspecified: Secondary | ICD-10-CM | POA: Diagnosis not present

## 2018-05-17 DIAGNOSIS — G309 Alzheimer's disease, unspecified: Secondary | ICD-10-CM | POA: Diagnosis not present

## 2018-05-18 DIAGNOSIS — R300 Dysuria: Secondary | ICD-10-CM | POA: Diagnosis not present

## 2018-05-18 DIAGNOSIS — G309 Alzheimer's disease, unspecified: Secondary | ICD-10-CM | POA: Diagnosis not present

## 2018-05-19 DIAGNOSIS — G309 Alzheimer's disease, unspecified: Secondary | ICD-10-CM | POA: Diagnosis not present

## 2018-05-20 DIAGNOSIS — G309 Alzheimer's disease, unspecified: Secondary | ICD-10-CM | POA: Diagnosis not present

## 2018-05-21 DIAGNOSIS — G309 Alzheimer's disease, unspecified: Secondary | ICD-10-CM | POA: Diagnosis not present

## 2018-05-22 DIAGNOSIS — G309 Alzheimer's disease, unspecified: Secondary | ICD-10-CM | POA: Diagnosis not present

## 2018-05-22 DIAGNOSIS — R300 Dysuria: Secondary | ICD-10-CM | POA: Diagnosis not present

## 2018-05-23 DIAGNOSIS — G309 Alzheimer's disease, unspecified: Secondary | ICD-10-CM | POA: Diagnosis not present

## 2018-05-24 DIAGNOSIS — G309 Alzheimer's disease, unspecified: Secondary | ICD-10-CM | POA: Diagnosis not present

## 2018-05-25 DIAGNOSIS — Z79899 Other long term (current) drug therapy: Secondary | ICD-10-CM | POA: Diagnosis not present

## 2018-05-25 DIAGNOSIS — F0390 Unspecified dementia without behavioral disturbance: Secondary | ICD-10-CM | POA: Diagnosis not present

## 2018-05-25 DIAGNOSIS — N390 Urinary tract infection, site not specified: Secondary | ICD-10-CM | POA: Diagnosis not present

## 2018-05-25 DIAGNOSIS — I4891 Unspecified atrial fibrillation: Secondary | ICD-10-CM | POA: Diagnosis not present

## 2018-05-25 DIAGNOSIS — G309 Alzheimer's disease, unspecified: Secondary | ICD-10-CM | POA: Diagnosis not present

## 2018-05-25 DIAGNOSIS — E039 Hypothyroidism, unspecified: Secondary | ICD-10-CM | POA: Diagnosis not present

## 2018-05-25 DIAGNOSIS — R350 Frequency of micturition: Secondary | ICD-10-CM | POA: Diagnosis not present

## 2018-05-26 DIAGNOSIS — G309 Alzheimer's disease, unspecified: Secondary | ICD-10-CM | POA: Diagnosis not present

## 2018-05-27 DIAGNOSIS — G309 Alzheimer's disease, unspecified: Secondary | ICD-10-CM | POA: Diagnosis not present

## 2018-05-28 DIAGNOSIS — G309 Alzheimer's disease, unspecified: Secondary | ICD-10-CM | POA: Diagnosis not present

## 2018-05-29 DIAGNOSIS — G309 Alzheimer's disease, unspecified: Secondary | ICD-10-CM | POA: Diagnosis not present

## 2018-05-30 DIAGNOSIS — G309 Alzheimer's disease, unspecified: Secondary | ICD-10-CM | POA: Diagnosis not present

## 2018-05-31 DIAGNOSIS — G309 Alzheimer's disease, unspecified: Secondary | ICD-10-CM | POA: Diagnosis not present

## 2018-06-01 DIAGNOSIS — G309 Alzheimer's disease, unspecified: Secondary | ICD-10-CM | POA: Diagnosis not present

## 2018-06-02 DIAGNOSIS — G309 Alzheimer's disease, unspecified: Secondary | ICD-10-CM | POA: Diagnosis not present

## 2018-06-03 DIAGNOSIS — G309 Alzheimer's disease, unspecified: Secondary | ICD-10-CM | POA: Diagnosis not present

## 2018-06-04 DIAGNOSIS — G309 Alzheimer's disease, unspecified: Secondary | ICD-10-CM | POA: Diagnosis not present

## 2018-06-05 DIAGNOSIS — G309 Alzheimer's disease, unspecified: Secondary | ICD-10-CM | POA: Diagnosis not present

## 2018-06-06 DIAGNOSIS — G309 Alzheimer's disease, unspecified: Secondary | ICD-10-CM | POA: Diagnosis not present

## 2018-06-07 DIAGNOSIS — G309 Alzheimer's disease, unspecified: Secondary | ICD-10-CM | POA: Diagnosis not present

## 2018-06-08 DIAGNOSIS — G309 Alzheimer's disease, unspecified: Secondary | ICD-10-CM | POA: Diagnosis not present

## 2018-06-09 DIAGNOSIS — G309 Alzheimer's disease, unspecified: Secondary | ICD-10-CM | POA: Diagnosis not present

## 2018-06-10 DIAGNOSIS — G309 Alzheimer's disease, unspecified: Secondary | ICD-10-CM | POA: Diagnosis not present

## 2018-06-11 DIAGNOSIS — G309 Alzheimer's disease, unspecified: Secondary | ICD-10-CM | POA: Diagnosis not present

## 2018-06-12 DIAGNOSIS — G309 Alzheimer's disease, unspecified: Secondary | ICD-10-CM | POA: Diagnosis not present

## 2018-06-13 DIAGNOSIS — I4891 Unspecified atrial fibrillation: Secondary | ICD-10-CM | POA: Diagnosis not present

## 2018-06-13 DIAGNOSIS — F419 Anxiety disorder, unspecified: Secondary | ICD-10-CM | POA: Diagnosis not present

## 2018-06-13 DIAGNOSIS — G309 Alzheimer's disease, unspecified: Secondary | ICD-10-CM | POA: Diagnosis not present

## 2018-06-13 DIAGNOSIS — Z87440 Personal history of urinary (tract) infections: Secondary | ICD-10-CM | POA: Diagnosis not present

## 2018-06-13 DIAGNOSIS — I10 Essential (primary) hypertension: Secondary | ICD-10-CM | POA: Diagnosis not present

## 2018-06-14 DIAGNOSIS — G309 Alzheimer's disease, unspecified: Secondary | ICD-10-CM | POA: Diagnosis not present

## 2018-06-15 DIAGNOSIS — I10 Essential (primary) hypertension: Secondary | ICD-10-CM | POA: Diagnosis not present

## 2018-06-15 DIAGNOSIS — I4891 Unspecified atrial fibrillation: Secondary | ICD-10-CM | POA: Diagnosis not present

## 2018-06-15 DIAGNOSIS — F419 Anxiety disorder, unspecified: Secondary | ICD-10-CM | POA: Diagnosis not present

## 2018-06-15 DIAGNOSIS — F0390 Unspecified dementia without behavioral disturbance: Secondary | ICD-10-CM | POA: Diagnosis not present

## 2018-06-15 DIAGNOSIS — G309 Alzheimer's disease, unspecified: Secondary | ICD-10-CM | POA: Diagnosis not present

## 2018-06-16 DIAGNOSIS — G309 Alzheimer's disease, unspecified: Secondary | ICD-10-CM | POA: Diagnosis not present

## 2018-06-17 DIAGNOSIS — G309 Alzheimer's disease, unspecified: Secondary | ICD-10-CM | POA: Diagnosis not present

## 2018-06-18 DIAGNOSIS — G309 Alzheimer's disease, unspecified: Secondary | ICD-10-CM | POA: Diagnosis not present

## 2018-06-19 ENCOUNTER — Encounter: Payer: Self-pay | Admitting: Cardiology

## 2018-06-19 DIAGNOSIS — G309 Alzheimer's disease, unspecified: Secondary | ICD-10-CM | POA: Diagnosis not present

## 2018-06-20 DIAGNOSIS — G309 Alzheimer's disease, unspecified: Secondary | ICD-10-CM | POA: Diagnosis not present

## 2018-06-21 DIAGNOSIS — G309 Alzheimer's disease, unspecified: Secondary | ICD-10-CM | POA: Diagnosis not present

## 2018-06-22 DIAGNOSIS — G309 Alzheimer's disease, unspecified: Secondary | ICD-10-CM | POA: Diagnosis not present

## 2018-06-23 DIAGNOSIS — G309 Alzheimer's disease, unspecified: Secondary | ICD-10-CM | POA: Diagnosis not present

## 2018-06-24 DIAGNOSIS — G309 Alzheimer's disease, unspecified: Secondary | ICD-10-CM | POA: Diagnosis not present

## 2018-06-25 DIAGNOSIS — G309 Alzheimer's disease, unspecified: Secondary | ICD-10-CM | POA: Diagnosis not present

## 2018-06-26 DIAGNOSIS — G309 Alzheimer's disease, unspecified: Secondary | ICD-10-CM | POA: Diagnosis not present

## 2018-06-27 ENCOUNTER — Non-Acute Institutional Stay: Payer: Medicare HMO | Admitting: Hospice and Palliative Medicine

## 2018-06-27 DIAGNOSIS — Z515 Encounter for palliative care: Secondary | ICD-10-CM

## 2018-06-27 DIAGNOSIS — R531 Weakness: Secondary | ICD-10-CM

## 2018-06-27 DIAGNOSIS — G309 Alzheimer's disease, unspecified: Secondary | ICD-10-CM | POA: Diagnosis not present

## 2018-06-28 DIAGNOSIS — Z515 Encounter for palliative care: Secondary | ICD-10-CM | POA: Diagnosis not present

## 2018-06-28 DIAGNOSIS — G309 Alzheimer's disease, unspecified: Secondary | ICD-10-CM | POA: Diagnosis not present

## 2018-06-28 DIAGNOSIS — R531 Weakness: Secondary | ICD-10-CM | POA: Diagnosis not present

## 2018-06-28 NOTE — Progress Notes (Signed)
PALLIATIVE CARE CONSULT VISIT   PATIENT NAME: [name] DOB: 11-Sep-1930 MRN: [id number]  PRIMARY CARE PROVIDER:   Patient, No Pcp Per  REFERRING PROVIDER:      No referring provider defined for this encounter.  RESPONSIBLE PARTY:   Spouse - Jacqualyn Sedgwick - [phone]; son Elta Guadeloupe [phone]  ASSESSMENT: I met with patient's husband today. He feels patient has been mostly stable, although he is concerned about some of the care at the facility. Patient is currently comfortable and has no symptom management complaints. I looked at her feet, which appear to have onychomcosis and I would recommend a podiatrist look at trimming her nails next time they are at the facility.   I did follow up today on previous conversations regarding code status. Husband does not want her resuscitated per her previous wishes.    RECOMMENDATIONS and PLAN:  1. DNR  I spent 30 minutes providing this consultation,  from 1230 to 1300. More than 50% of the time in this consultation was spent coordinating communication.   HISTORY OF PRESENT ILLNESS:  [name] is an 84 yo woman with multiple medical problems including CAD, CM with EF 35%, afib on anticoagulant, DM, HTN, h/o CVA, hypothyroidism, COPD, who has had recent progressive weakness and FTT. Patient has been transferred from ALF to memory care due to wandering and progression of dementia. Routine follow up visit made today.   CODE STATUS: DNR  PPS: 40% HOSPICE ELIGIBILITY/DIAGNOSIS: TBD  PAST MEDICAL HISTORY:  Past Medical History:  Diagnosis Date  . A-fib ([place])   . Anxiety   . Arthritis   . Bronchitis 02/2016  . Coronary artery disease 12/2007  . Diabetes mellitus    x 10 yrs at least  . History of stress test 08/2011   negative for ischemia it was nongated and nondiagnostic and compatible with a possible scar or peri-infraction ischemia.   Marland Kitchen Hx of echocardiogram 09/2012   showed Ef 35-40% with no significant valve disease except for  mild to moderate MR. normal RVSP and left atrium was severly dilated.  . Hypertension   . Pacemaker   . Polio   . Stroke ([place])   . Thyroid disease    hypothyroidism    SOCIAL HX:  Social History   Tobacco Use  . Smoking status: Never Smoker  . Smokeless tobacco: Never Used  Substance Use Topics  . Alcohol use: No    Alcohol/week: 0.0 oz    ALLERGIES:  Allergies  Allergen Reactions  . Iohexol Other (See Comments)     Desc: CHEST PAIN, LOC AFTER HEART CATH, PT REFUSED DYE   . Penicillins Other (See Comments)    Has patient had a PCN reaction causing immediate rash, facial/tongue/throat swelling, SOB or lightheadedness with hypotension: No Has patient had a PCN reaction causing severe rash involving mucus membranes or skin necrosis: Unknown Has patient had a PCN reaction that required hospitalization: Unknown Has patient had a PCN reaction occurring within the last 10 years: Unknown If all of the above answers are "NO", then may proceed with Cephalosporin use.   . Sulfa Antibiotics Other (See Comments)    Told not to take med     PERTINENT MEDICATIONS:  Outpatient Encounter Medications as of 06/27/2018  Medication Sig  . ALPRAZolam (XANAX) 0.25 MG tablet Take 1 tablet (0.25 mg total) by mouth 2 (two) times daily. (Patient taking differently: Take 0.5 mg by mouth 2 (two) times daily. )  . Amino Acids-Protein  Hydrolys (FEEDING SUPPLEMENT, PRO-STAT SUGAR FREE 64,) LIQD Take 30 mLs by mouth daily.  Marland Kitchen apixaban (ELIQUIS) 2.5 MG TABS tablet Take 2.5 mg by mouth 2 (two) times daily.  . benzonatate (TESSALON) 100 MG capsule Take 100 mg by mouth 3 (three) times daily as needed for cough.  . budesonide-formoterol (SYMBICORT) 160-4.5 MCG/ACT inhaler Inhale 2 puffs into the lungs 2 (two) times daily.  Marland Kitchen dextromethorphan-guaiFENesin (MUCINEX DM) 30-600 MG 12hr tablet Take 1 tablet by mouth 2 (two) times daily.  . digoxin (DIGOX) 0.125 MG tablet Take 1 tablet (125 mcg total) by mouth daily.  Patient needs to call our office to schedule an appointment with Dr Claiborne Billings for future refills.  . diphenoxylate-atropine (LOMOTIL) 2.5-0.025 MG tablet Take 1 tablet by mouth daily as needed for diarrhea or loose stools.  . divalproex (DEPAKOTE SPRINKLE) 125 MG capsule Take 125 mg by mouth 2 (two) times daily.  Marland Kitchen donepezil (ARICEPT) 5 MG tablet Take 5 mg by mouth at bedtime.  Marland Kitchen FLUoxetine (PROZAC) 20 MG capsule Take 20 mg by mouth daily.  Marland Kitchen guaiFENesin-dextromethorphan (ROBITUSSIN DM) 100-10 MG/5ML syrup Take 10 mLs by mouth every 6 (six) hours. (Patient not taking: Reported on 04/14/2018)  . Ipratropium-Albuterol (COMBIVENT RESPIMAT) 20-100 MCG/ACT AERS respimat Inhale 1 puff into the lungs every 6 (six) hours.  . isosorbide mononitrate (IMDUR) 30 MG 24 hr tablet Take 1 tablet (30 mg total) by mouth daily. Patient needs to call our office to schedule an appointment with Dr Claiborne Billings for future refills. (Patient taking differently: Take 30 mg by mouth daily. )  . levothyroxine (SYNTHROID, LEVOTHROID) 100 MCG tablet TAKE 1 TABLET BY MOUTH EVERY DAY  . loperamide (IMODIUM) 2 MG capsule Take 2 mg by mouth daily as needed for diarrhea or loose stools.   . Melatonin 1 MG TABS Take 1 tablet by mouth at bedtime.   . memantine (NAMENDA) 5 MG tablet Take 5 mg by mouth 2 (two) times daily.  . metoprolol (LOPRESSOR) 50 MG tablet Take 1 tablet (50 mg total) by mouth twice daily. Patient needs to call our office to schedule an appointment with Dr Claiborne Billings for refills. (Patient taking differently: Take 25 mg by mouth 2 (two) times daily. )  . mirtazapine (REMERON) 7.5 MG tablet Take 7.5 mg by mouth at bedtime.  . NONFORMULARY OR COMPOUNDED ITEM Estradiol 0.02 % 43m prefilled applicator Sig: apply twice a week  . pravastatin (PRAVACHOL) 20 MG tablet Take 20 mg by mouth daily.  . Probiotic Product (ALIGN) 4 MG CAPS Take 1 capsule by mouth daily.  . PSYLLIUM FIBER PO Take 2 capsules by mouth daily.  . traMADol (ULTRAM) 50  MG tablet Take 25 mg by mouth daily as needed.   . warfarin (COUMADIN) 2.5 MG tablet TAKE 1 TABLET BY MOUTH DAILY AS DIRECTED BY COUMADIN CLINIC (Patient not taking: Reported on 04/14/2018)   No facility-administered encounter medications on file as of 06/27/2018.     PHYSICAL EXAM:   General: NAD, frail appearing, thin, lying in bed Pulmonary: unlabored Extremities: no edema, no joint deformities Skin: no rashes, thickened and yellow toe nails Neurological: Weakness, confusion  JIrean Hong NP

## 2018-06-29 DIAGNOSIS — G309 Alzheimer's disease, unspecified: Secondary | ICD-10-CM | POA: Diagnosis not present

## 2018-06-30 DIAGNOSIS — G309 Alzheimer's disease, unspecified: Secondary | ICD-10-CM | POA: Diagnosis not present

## 2018-07-01 DIAGNOSIS — G309 Alzheimer's disease, unspecified: Secondary | ICD-10-CM | POA: Diagnosis not present

## 2018-07-02 DIAGNOSIS — G309 Alzheimer's disease, unspecified: Secondary | ICD-10-CM | POA: Diagnosis not present

## 2018-07-03 DIAGNOSIS — I10 Essential (primary) hypertension: Secondary | ICD-10-CM | POA: Diagnosis not present

## 2018-07-03 DIAGNOSIS — F419 Anxiety disorder, unspecified: Secondary | ICD-10-CM | POA: Diagnosis not present

## 2018-07-03 DIAGNOSIS — G309 Alzheimer's disease, unspecified: Secondary | ICD-10-CM | POA: Diagnosis not present

## 2018-07-03 DIAGNOSIS — I4891 Unspecified atrial fibrillation: Secondary | ICD-10-CM | POA: Diagnosis not present

## 2018-07-03 DIAGNOSIS — F0390 Unspecified dementia without behavioral disturbance: Secondary | ICD-10-CM | POA: Diagnosis not present

## 2018-07-04 DIAGNOSIS — G309 Alzheimer's disease, unspecified: Secondary | ICD-10-CM | POA: Diagnosis not present

## 2018-07-05 DIAGNOSIS — G309 Alzheimer's disease, unspecified: Secondary | ICD-10-CM | POA: Diagnosis not present

## 2018-07-06 DIAGNOSIS — G309 Alzheimer's disease, unspecified: Secondary | ICD-10-CM | POA: Diagnosis not present

## 2018-07-07 DIAGNOSIS — G309 Alzheimer's disease, unspecified: Secondary | ICD-10-CM | POA: Diagnosis not present

## 2018-07-08 DIAGNOSIS — G309 Alzheimer's disease, unspecified: Secondary | ICD-10-CM | POA: Diagnosis not present

## 2018-07-09 DIAGNOSIS — G309 Alzheimer's disease, unspecified: Secondary | ICD-10-CM | POA: Diagnosis not present

## 2018-07-10 DIAGNOSIS — G309 Alzheimer's disease, unspecified: Secondary | ICD-10-CM | POA: Diagnosis not present

## 2018-07-11 DIAGNOSIS — F0281 Dementia in other diseases classified elsewhere with behavioral disturbance: Secondary | ICD-10-CM | POA: Diagnosis not present

## 2018-07-11 DIAGNOSIS — F4322 Adjustment disorder with anxiety: Secondary | ICD-10-CM | POA: Diagnosis not present

## 2018-07-11 DIAGNOSIS — F321 Major depressive disorder, single episode, moderate: Secondary | ICD-10-CM | POA: Diagnosis not present

## 2018-07-11 DIAGNOSIS — G309 Alzheimer's disease, unspecified: Secondary | ICD-10-CM | POA: Diagnosis not present

## 2018-07-11 DIAGNOSIS — F418 Other specified anxiety disorders: Secondary | ICD-10-CM | POA: Diagnosis not present

## 2018-07-12 DIAGNOSIS — I4891 Unspecified atrial fibrillation: Secondary | ICD-10-CM | POA: Diagnosis not present

## 2018-07-12 DIAGNOSIS — G309 Alzheimer's disease, unspecified: Secondary | ICD-10-CM | POA: Diagnosis not present

## 2018-07-12 DIAGNOSIS — F419 Anxiety disorder, unspecified: Secondary | ICD-10-CM | POA: Diagnosis not present

## 2018-07-12 DIAGNOSIS — I10 Essential (primary) hypertension: Secondary | ICD-10-CM | POA: Diagnosis not present

## 2018-07-12 DIAGNOSIS — F0390 Unspecified dementia without behavioral disturbance: Secondary | ICD-10-CM | POA: Diagnosis not present

## 2018-07-13 DIAGNOSIS — G309 Alzheimer's disease, unspecified: Secondary | ICD-10-CM | POA: Diagnosis not present

## 2018-07-14 DIAGNOSIS — G309 Alzheimer's disease, unspecified: Secondary | ICD-10-CM | POA: Diagnosis not present

## 2018-07-15 DIAGNOSIS — G309 Alzheimer's disease, unspecified: Secondary | ICD-10-CM | POA: Diagnosis not present

## 2018-07-16 DIAGNOSIS — G309 Alzheimer's disease, unspecified: Secondary | ICD-10-CM | POA: Diagnosis not present

## 2018-07-17 DIAGNOSIS — G309 Alzheimer's disease, unspecified: Secondary | ICD-10-CM | POA: Diagnosis not present

## 2018-07-18 DIAGNOSIS — G309 Alzheimer's disease, unspecified: Secondary | ICD-10-CM | POA: Diagnosis not present

## 2018-07-19 DIAGNOSIS — G309 Alzheimer's disease, unspecified: Secondary | ICD-10-CM | POA: Diagnosis not present

## 2018-07-20 ENCOUNTER — Non-Acute Institutional Stay: Payer: Medicare HMO | Admitting: Hospice and Palliative Medicine

## 2018-07-20 DIAGNOSIS — Z515 Encounter for palliative care: Secondary | ICD-10-CM | POA: Diagnosis not present

## 2018-07-20 DIAGNOSIS — G309 Alzheimer's disease, unspecified: Secondary | ICD-10-CM | POA: Diagnosis not present

## 2018-07-20 NOTE — Progress Notes (Signed)
PALLIATIVE CARE CONSULT VISIT   PATIENT NAME: [name] DOB: 17-Mar-1930 MRN: [id number]  PRIMARY CARE PROVIDER:   Patient, No Pcp Per  REFERRING PROVIDER:      No referring provider defined for this encounter.  RESPONSIBLE PARTY:   Spouse - Lisamarie Coke - [phone]; son Loraine Leriche [phone]  ASSESSMENT: Appears clinically unchanged from when last seen. Patient is comfortable appearing and pleasantly confused. No family present. Staff had no concerns today.    RECOMMENDATIONS and PLAN:  1. Continue supportive care  I spent 15 minutes providing this consultation,  from 1500 to 1515. More than 50% of the time in this consultation was spent coordinating communication.   HISTORY OF PRESENT ILLNESS:  [name] is an 84 yo woman with multiple medical problems including CAD, CM with EF 35%, afib on anticoagulant, DM, HTN, h/o CVA, hypothyroidism, COPD, who has had recent progressive weakness and FTT. Patient has been transferred from ALF to memory care due to wandering and progression of dementia. Routine follow up visit made today.   CODE STATUS: DNR  PPS: 40% HOSPICE ELIGIBILITY/DIAGNOSIS: TBD  PAST MEDICAL HISTORY:  Past Medical History:  Diagnosis Date  . A-fib ([facility])   . Anxiety   . Arthritis   . Bronchitis 02/2016  . Coronary artery disease 12/2007  . Diabetes mellitus    x 10 yrs at least  . History of stress test 08/2011   negative for ischemia it was nongated and nondiagnostic and compatible with a possible scar or peri-infraction ischemia.   Marland Kitchen Hx of echocardiogram 09/2012   showed Ef 35-40% with no significant valve disease except for mild to moderate MR. normal RVSP and left atrium was severly dilated.  . Hypertension   . Pacemaker   . Polio   . Stroke ([facility])   . Thyroid disease    hypothyroidism    SOCIAL HX:  Social History   Tobacco Use  . Smoking status: Never Smoker  . Smokeless tobacco: Never Used  Substance Use Topics  . Alcohol use: No   Alcohol/week: 0.0 standard drinks    ALLERGIES:  Allergies  Allergen Reactions  . Iohexol Other (See Comments)     Desc: CHEST PAIN, LOC AFTER HEART CATH, PT REFUSED DYE   . Penicillins Other (See Comments)    Has patient had a PCN reaction causing immediate rash, facial/tongue/throat swelling, SOB or lightheadedness with hypotension: No Has patient had a PCN reaction causing severe rash involving mucus membranes or skin necrosis: Unknown Has patient had a PCN reaction that required hospitalization: Unknown Has patient had a PCN reaction occurring within the last 10 years: Unknown If all of the above answers are "NO", then may proceed with Cephalosporin use.   . Sulfa Antibiotics Other (See Comments)    Told not to take med     PERTINENT MEDICATIONS:  Outpatient Encounter Medications as of 07/20/2018  Medication Sig  . ALPRAZolam (XANAX) 0.25 MG tablet Take 1 tablet (0.25 mg total) by mouth 2 (two) times daily. (Patient taking differently: Take 0.5 mg by mouth 2 (two) times daily. )  . Amino Acids-Protein Hydrolys (FEEDING SUPPLEMENT, PRO-STAT SUGAR FREE 64,) LIQD Take 30 mLs by mouth daily.  Marland Kitchen apixaban (ELIQUIS) 2.5 MG TABS tablet Take 2.5 mg by mouth 2 (two) times daily.  . benzonatate (TESSALON) 100 MG capsule Take 100 mg by mouth 3 (three) times daily as needed for cough.  . budesonide-formoterol (SYMBICORT) 160-4.5 MCG/ACT inhaler Inhale 2 puffs into the  lungs 2 (two) times daily.  Marland Kitchen dextromethorphan-guaiFENesin (MUCINEX DM) 30-600 MG 12hr tablet Take 1 tablet by mouth 2 (two) times daily.  . digoxin (DIGOX) 0.125 MG tablet Take 1 tablet (125 mcg total) by mouth daily. Patient needs to call our office to schedule an appointment with Dr Tresa Endo for future refills.  . diphenoxylate-atropine (LOMOTIL) 2.5-0.025 MG tablet Take 1 tablet by mouth daily as needed for diarrhea or loose stools.  . divalproex (DEPAKOTE SPRINKLE) 125 MG capsule Take 125 mg by mouth 2 (two) times daily.  Marland Kitchen  donepezil (ARICEPT) 5 MG tablet Take 5 mg by mouth at bedtime.  Marland Kitchen FLUoxetine (PROZAC) 20 MG capsule Take 20 mg by mouth daily.  Marland Kitchen guaiFENesin-dextromethorphan (ROBITUSSIN DM) 100-10 MG/5ML syrup Take 10 mLs by mouth every 6 (six) hours. (Patient not taking: Reported on 04/14/2018)  . Ipratropium-Albuterol (COMBIVENT RESPIMAT) 20-100 MCG/ACT AERS respimat Inhale 1 puff into the lungs every 6 (six) hours.  . isosorbide mononitrate (IMDUR) 30 MG 24 hr tablet Take 1 tablet (30 mg total) by mouth daily. Patient needs to call our office to schedule an appointment with Dr Tresa Endo for future refills. (Patient taking differently: Take 30 mg by mouth daily. )  . levothyroxine (SYNTHROID, LEVOTHROID) 100 MCG tablet TAKE 1 TABLET BY MOUTH EVERY DAY  . loperamide (IMODIUM) 2 MG capsule Take 2 mg by mouth daily as needed for diarrhea or loose stools.   . Melatonin 1 MG TABS Take 1 tablet by mouth at bedtime.   . memantine (NAMENDA) 5 MG tablet Take 5 mg by mouth 2 (two) times daily.  . metoprolol (LOPRESSOR) 50 MG tablet Take 1 tablet (50 mg total) by mouth twice daily. Patient needs to call our office to schedule an appointment with Dr Tresa Endo for refills. (Patient taking differently: Take 25 mg by mouth 2 (two) times daily. )  . mirtazapine (REMERON) 7.5 MG tablet Take 7.5 mg by mouth at bedtime.  . NONFORMULARY OR COMPOUNDED ITEM Estradiol 0.02 % 28ml prefilled applicator Sig: apply twice a week  . pravastatin (PRAVACHOL) 20 MG tablet Take 20 mg by mouth daily.  . Probiotic Product (ALIGN) 4 MG CAPS Take 1 capsule by mouth daily.  . PSYLLIUM FIBER PO Take 2 capsules by mouth daily.  . traMADol (ULTRAM) 50 MG tablet Take 25 mg by mouth daily as needed.   . warfarin (COUMADIN) 2.5 MG tablet TAKE 1 TABLET BY MOUTH DAILY AS DIRECTED BY COUMADIN CLINIC (Patient not taking: Reported on 04/14/2018)   No facility-administered encounter medications on file as of 07/20/2018.     PHYSICAL EXAM:   General: NAD, frail  appearing, thin, lying in bed Pulmonary: unlabored, CTA ant fields Cardiology: RRR Extremities: no edema, no joint deformities Skin: no rashes, thickened and yellow toe nails Neurological: Weakness, confusion  Malachy Moan, NP

## 2018-07-21 DIAGNOSIS — G309 Alzheimer's disease, unspecified: Secondary | ICD-10-CM | POA: Diagnosis not present

## 2018-07-22 DIAGNOSIS — G309 Alzheimer's disease, unspecified: Secondary | ICD-10-CM | POA: Diagnosis not present

## 2018-07-23 DIAGNOSIS — G309 Alzheimer's disease, unspecified: Secondary | ICD-10-CM | POA: Diagnosis not present

## 2018-07-24 DIAGNOSIS — G309 Alzheimer's disease, unspecified: Secondary | ICD-10-CM | POA: Diagnosis not present

## 2018-07-25 DIAGNOSIS — G309 Alzheimer's disease, unspecified: Secondary | ICD-10-CM | POA: Diagnosis not present

## 2018-07-26 DIAGNOSIS — G309 Alzheimer's disease, unspecified: Secondary | ICD-10-CM | POA: Diagnosis not present

## 2018-07-27 DIAGNOSIS — G309 Alzheimer's disease, unspecified: Secondary | ICD-10-CM | POA: Diagnosis not present

## 2018-07-28 DIAGNOSIS — G309 Alzheimer's disease, unspecified: Secondary | ICD-10-CM | POA: Diagnosis not present

## 2018-07-29 DIAGNOSIS — G309 Alzheimer's disease, unspecified: Secondary | ICD-10-CM | POA: Diagnosis not present

## 2018-07-30 DIAGNOSIS — G309 Alzheimer's disease, unspecified: Secondary | ICD-10-CM | POA: Diagnosis not present

## 2018-07-31 DIAGNOSIS — G309 Alzheimer's disease, unspecified: Secondary | ICD-10-CM | POA: Diagnosis not present

## 2018-08-01 DIAGNOSIS — G309 Alzheimer's disease, unspecified: Secondary | ICD-10-CM | POA: Diagnosis not present

## 2018-08-02 DIAGNOSIS — G309 Alzheimer's disease, unspecified: Secondary | ICD-10-CM | POA: Diagnosis not present

## 2018-08-03 DIAGNOSIS — G309 Alzheimer's disease, unspecified: Secondary | ICD-10-CM | POA: Diagnosis not present

## 2018-08-04 DIAGNOSIS — R300 Dysuria: Secondary | ICD-10-CM | POA: Diagnosis not present

## 2018-08-04 DIAGNOSIS — G309 Alzheimer's disease, unspecified: Secondary | ICD-10-CM | POA: Diagnosis not present

## 2018-08-05 DIAGNOSIS — G309 Alzheimer's disease, unspecified: Secondary | ICD-10-CM | POA: Diagnosis not present

## 2018-08-06 DIAGNOSIS — G309 Alzheimer's disease, unspecified: Secondary | ICD-10-CM | POA: Diagnosis not present

## 2018-08-08 ENCOUNTER — Non-Acute Institutional Stay: Payer: Medicare HMO | Admitting: Primary Care

## 2018-08-08 DIAGNOSIS — Z515 Encounter for palliative care: Secondary | ICD-10-CM | POA: Diagnosis not present

## 2018-08-08 DIAGNOSIS — F4322 Adjustment disorder with anxiety: Secondary | ICD-10-CM | POA: Diagnosis not present

## 2018-08-08 DIAGNOSIS — F418 Other specified anxiety disorders: Secondary | ICD-10-CM | POA: Diagnosis not present

## 2018-08-08 DIAGNOSIS — G309 Alzheimer's disease, unspecified: Secondary | ICD-10-CM | POA: Diagnosis not present

## 2018-08-08 DIAGNOSIS — F321 Major depressive disorder, single episode, moderate: Secondary | ICD-10-CM | POA: Diagnosis not present

## 2018-08-08 DIAGNOSIS — F0281 Dementia in other diseases classified elsewhere with behavioral disturbance: Secondary | ICD-10-CM | POA: Diagnosis not present

## 2018-08-08 NOTE — Progress Notes (Signed)
PALLIATIVE CARE CONSULT VISIT   PATIENT NAME: [name] DOB: 09-23-1930 MRN: [id number]  PRIMARY CARE PROVIDER:   Patient, No Pcp Per  [facility], [place] NP  REFERRING PROVIDER:      No referring provider defined for this encounter.  RESPONSIBLE PARTY:   Spouse - [name] - [phone]; son [name] [phone]  ASSESSMENT:  Patient sitting in common area and comfortable appearing, interactive. Denies pain or concerns. Staff deny any changes,falls or other problems. Talked by phone to husband who shared concerns of needing podiatry for thickened nails. Asked RE previous advance care planning conversation. Husband states he would not want wife to be resuscitated or life prolonged artificially. DNR order written.     RECOMMENDATIONS and PLAN:  1. Continue supportive care 2. DNR completed 3. Will contact PCP for order for podiatry.  I spent 15 minutes providing this consultation,  from 1100 to 1115. More than 50% of the time in this consultation was spent coordinating communication.   HISTORY OF PRESENT ILLNESS:  Ms. [name] is an 84 yo woman with multiple medical problems including CAD, CM with EF 35%, afib on anticoagulant, DM, HTN, h/o CVA, hypothyroidism, COPD, who has had recent progressive weakness and FTT. Patient has been transferred from ALF to memory care due to wandering and progression of dementia. Routine follow up visit made today.   CODE STATUS: DNR  PPS: 40% HOSPICE ELIGIBILITY/DIAGNOSIS: TBD  PAST MEDICAL HISTORY:  Past Medical History:  Diagnosis Date  . A-fib ([facility])   . Anxiety   . Arthritis   . Bronchitis 02/2016  . Coronary artery disease 12/2007  . Diabetes mellitus    x 10 yrs at least  . History of stress test 08/2011   negative for ischemia it was nongated and nondiagnostic and compatible with a possible scar or peri-infraction ischemia.   Marland Kitchen Hx of echocardiogram 09/2012   showed Ef 35-40% with no significant valve disease  except for mild to moderate MR. normal RVSP and left atrium was severly dilated.  . Hypertension   . Pacemaker   . Polio   . Stroke ([facility])   . Thyroid disease    hypothyroidism    SOCIAL HX:  Social History   Tobacco Use  . Smoking status: Never Smoker  . Smokeless tobacco: Never Used  Substance Use Topics  . Alcohol use: No    Alcohol/week: 0.0 standard drinks    ALLERGIES:  Allergies  Allergen Reactions  . Iohexol Other (See Comments)     Desc: CHEST PAIN, LOC AFTER HEART CATH, PT REFUSED DYE   . Penicillins Other (See Comments)    Has patient had a PCN reaction causing immediate rash, facial/tongue/throat swelling, SOB or lightheadedness with hypotension: No Has patient had a PCN reaction causing severe rash involving mucus membranes or skin necrosis: Unknown Has patient had a PCN reaction that required hospitalization: Unknown Has patient had a PCN reaction occurring within the last 10 years: Unknown If all of the above answers are "NO", then may proceed with Cephalosporin use.   . Sulfa Antibiotics Other (See Comments)    Told not to take med     PERTINENT MEDICATIONS:  Outpatient Encounter Medications as of 08/08/2018  Medication Sig  . ALPRAZolam (XANAX) 0.25 MG tablet Take 1 tablet (0.25 mg total) by mouth 2 (two) times daily. (Patient taking differently: Take 0.5 mg by mouth 2 (two) times daily. )  . Amino Acids-Protein Hydrolys (FEEDING SUPPLEMENT, PRO-STAT SUGAR  FREE 64,) LIQD Take 30 mLs by mouth daily.  Marland Kitchen apixaban (ELIQUIS) 2.5 MG TABS tablet Take 2.5 mg by mouth 2 (two) times daily.  . benzonatate (TESSALON) 100 MG capsule Take 100 mg by mouth 3 (three) times daily as needed for cough.  . budesonide-formoterol (SYMBICORT) 160-4.5 MCG/ACT inhaler Inhale 2 puffs into the lungs 2 (two) times daily.  Marland Kitchen dextromethorphan-guaiFENesin (MUCINEX DM) 30-600 MG 12hr tablet Take 1 tablet by mouth 2 (two) times daily.  . digoxin (DIGOX) 0.125 MG tablet Take 1 tablet (125 mcg  total) by mouth daily. Patient needs to call our office to schedule an appointment with Dr Tresa Endo for future refills.  . diphenoxylate-atropine (LOMOTIL) 2.5-0.025 MG tablet Take 1 tablet by mouth daily as needed for diarrhea or loose stools.  . divalproex (DEPAKOTE SPRINKLE) 125 MG capsule Take 125 mg by mouth 2 (two) times daily.  Marland Kitchen donepezil (ARICEPT) 5 MG tablet Take 5 mg by mouth at bedtime.  Marland Kitchen FLUoxetine (PROZAC) 20 MG capsule Take 20 mg by mouth daily.  Marland Kitchen guaiFENesin-dextromethorphan (ROBITUSSIN DM) 100-10 MG/5ML syrup Take 10 mLs by mouth every 6 (six) hours. (Patient not taking: Reported on 04/14/2018)  . Ipratropium-Albuterol (COMBIVENT RESPIMAT) 20-100 MCG/ACT AERS respimat Inhale 1 puff into the lungs every 6 (six) hours.  . isosorbide mononitrate (IMDUR) 30 MG 24 hr tablet Take 1 tablet (30 mg total) by mouth daily. Patient needs to call our office to schedule an appointment with Dr Tresa Endo for future refills. (Patient taking differently: Take 30 mg by mouth daily. )  . levothyroxine (SYNTHROID, LEVOTHROID) 100 MCG tablet TAKE 1 TABLET BY MOUTH EVERY DAY  . loperamide (IMODIUM) 2 MG capsule Take 2 mg by mouth daily as needed for diarrhea or loose stools.   . Melatonin 1 MG TABS Take 1 tablet by mouth at bedtime.   . memantine (NAMENDA) 5 MG tablet Take 5 mg by mouth 2 (two) times daily.  . metoprolol (LOPRESSOR) 50 MG tablet Take 1 tablet (50 mg total) by mouth twice daily. Patient needs to call our office to schedule an appointment with Dr Tresa Endo for refills. (Patient taking differently: Take 25 mg by mouth 2 (two) times daily. )  . mirtazapine (REMERON) 7.5 MG tablet Take 7.5 mg by mouth at bedtime.  . NONFORMULARY OR COMPOUNDED ITEM Estradiol 0.02 % 1ml prefilled applicator Sig: apply twice a week  . pravastatin (PRAVACHOL) 20 MG tablet Take 20 mg by mouth daily.  . Probiotic Product (ALIGN) 4 MG CAPS Take 1 capsule by mouth daily.  . PSYLLIUM FIBER PO Take 2 capsules by mouth daily.    . traMADol (ULTRAM) 50 MG tablet Take 25 mg by mouth daily as needed.   . warfarin (COUMADIN) 2.5 MG tablet TAKE 1 TABLET BY MOUTH DAILY AS DIRECTED BY COUMADIN CLINIC (Patient not taking: Reported on 04/14/2018)   No facility-administered encounter medications on file as of 08/08/2018.     PHYSICAL EXAM:   General: NAD, frail appearing, thin, in common room talking with friend Pulmonary: unlabored, regular rate Cardiology: no cyanosis, pulse RRR Extremities: no edema, no joint deformities Skin: no rashes Neurological: Weakness, confusion  Paulina Fusi, NP

## 2018-08-09 DIAGNOSIS — N3090 Cystitis, unspecified without hematuria: Secondary | ICD-10-CM | POA: Diagnosis not present

## 2018-08-09 DIAGNOSIS — I10 Essential (primary) hypertension: Secondary | ICD-10-CM | POA: Diagnosis not present

## 2018-08-09 DIAGNOSIS — F0390 Unspecified dementia without behavioral disturbance: Secondary | ICD-10-CM | POA: Diagnosis not present

## 2018-08-09 DIAGNOSIS — F419 Anxiety disorder, unspecified: Secondary | ICD-10-CM | POA: Diagnosis not present

## 2018-08-14 DIAGNOSIS — G309 Alzheimer's disease, unspecified: Secondary | ICD-10-CM | POA: Diagnosis not present

## 2018-08-15 DIAGNOSIS — G309 Alzheimer's disease, unspecified: Secondary | ICD-10-CM | POA: Diagnosis not present

## 2018-08-16 DIAGNOSIS — G309 Alzheimer's disease, unspecified: Secondary | ICD-10-CM | POA: Diagnosis not present

## 2018-08-17 DIAGNOSIS — G309 Alzheimer's disease, unspecified: Secondary | ICD-10-CM | POA: Diagnosis not present

## 2018-08-18 DIAGNOSIS — G309 Alzheimer's disease, unspecified: Secondary | ICD-10-CM | POA: Diagnosis not present

## 2018-08-19 DIAGNOSIS — G309 Alzheimer's disease, unspecified: Secondary | ICD-10-CM | POA: Diagnosis not present

## 2018-08-20 DIAGNOSIS — G309 Alzheimer's disease, unspecified: Secondary | ICD-10-CM | POA: Diagnosis not present

## 2018-08-21 DIAGNOSIS — G309 Alzheimer's disease, unspecified: Secondary | ICD-10-CM | POA: Diagnosis not present

## 2018-08-22 DIAGNOSIS — G309 Alzheimer's disease, unspecified: Secondary | ICD-10-CM | POA: Diagnosis not present

## 2018-08-23 DIAGNOSIS — G309 Alzheimer's disease, unspecified: Secondary | ICD-10-CM | POA: Diagnosis not present

## 2018-08-24 DIAGNOSIS — G309 Alzheimer's disease, unspecified: Secondary | ICD-10-CM | POA: Diagnosis not present

## 2018-08-25 DIAGNOSIS — G309 Alzheimer's disease, unspecified: Secondary | ICD-10-CM | POA: Diagnosis not present

## 2018-08-26 DIAGNOSIS — G309 Alzheimer's disease, unspecified: Secondary | ICD-10-CM | POA: Diagnosis not present

## 2018-08-27 DIAGNOSIS — G309 Alzheimer's disease, unspecified: Secondary | ICD-10-CM | POA: Diagnosis not present

## 2018-08-28 DIAGNOSIS — F419 Anxiety disorder, unspecified: Secondary | ICD-10-CM | POA: Diagnosis not present

## 2018-08-28 DIAGNOSIS — F0390 Unspecified dementia without behavioral disturbance: Secondary | ICD-10-CM | POA: Diagnosis not present

## 2018-08-28 DIAGNOSIS — I4891 Unspecified atrial fibrillation: Secondary | ICD-10-CM | POA: Diagnosis not present

## 2018-08-28 DIAGNOSIS — G309 Alzheimer's disease, unspecified: Secondary | ICD-10-CM | POA: Diagnosis not present

## 2018-08-28 DIAGNOSIS — I10 Essential (primary) hypertension: Secondary | ICD-10-CM | POA: Diagnosis not present

## 2018-08-29 DIAGNOSIS — G309 Alzheimer's disease, unspecified: Secondary | ICD-10-CM | POA: Diagnosis not present

## 2018-08-30 DIAGNOSIS — G309 Alzheimer's disease, unspecified: Secondary | ICD-10-CM | POA: Diagnosis not present

## 2018-08-31 DIAGNOSIS — G309 Alzheimer's disease, unspecified: Secondary | ICD-10-CM | POA: Diagnosis not present

## 2018-09-01 DIAGNOSIS — G309 Alzheimer's disease, unspecified: Secondary | ICD-10-CM | POA: Diagnosis not present

## 2018-09-02 DIAGNOSIS — G309 Alzheimer's disease, unspecified: Secondary | ICD-10-CM | POA: Diagnosis not present

## 2018-09-03 DIAGNOSIS — G309 Alzheimer's disease, unspecified: Secondary | ICD-10-CM | POA: Diagnosis not present

## 2018-09-04 DIAGNOSIS — G309 Alzheimer's disease, unspecified: Secondary | ICD-10-CM | POA: Diagnosis not present

## 2018-09-05 DIAGNOSIS — G309 Alzheimer's disease, unspecified: Secondary | ICD-10-CM | POA: Diagnosis not present

## 2018-09-06 DIAGNOSIS — G309 Alzheimer's disease, unspecified: Secondary | ICD-10-CM | POA: Diagnosis not present

## 2018-09-07 DIAGNOSIS — G309 Alzheimer's disease, unspecified: Secondary | ICD-10-CM | POA: Diagnosis not present

## 2018-09-07 DIAGNOSIS — I10 Essential (primary) hypertension: Secondary | ICD-10-CM | POA: Diagnosis not present

## 2018-09-07 DIAGNOSIS — F0390 Unspecified dementia without behavioral disturbance: Secondary | ICD-10-CM | POA: Diagnosis not present

## 2018-09-07 DIAGNOSIS — F419 Anxiety disorder, unspecified: Secondary | ICD-10-CM | POA: Diagnosis not present

## 2018-09-07 DIAGNOSIS — K580 Irritable bowel syndrome with diarrhea: Secondary | ICD-10-CM | POA: Diagnosis not present

## 2018-09-08 DIAGNOSIS — G309 Alzheimer's disease, unspecified: Secondary | ICD-10-CM | POA: Diagnosis not present

## 2018-09-09 DIAGNOSIS — G309 Alzheimer's disease, unspecified: Secondary | ICD-10-CM | POA: Diagnosis not present

## 2018-09-10 DIAGNOSIS — G309 Alzheimer's disease, unspecified: Secondary | ICD-10-CM | POA: Diagnosis not present

## 2018-09-11 DIAGNOSIS — G309 Alzheimer's disease, unspecified: Secondary | ICD-10-CM | POA: Diagnosis not present

## 2018-09-12 DIAGNOSIS — F321 Major depressive disorder, single episode, moderate: Secondary | ICD-10-CM | POA: Diagnosis not present

## 2018-09-12 DIAGNOSIS — G309 Alzheimer's disease, unspecified: Secondary | ICD-10-CM | POA: Diagnosis not present

## 2018-09-12 DIAGNOSIS — F0281 Dementia in other diseases classified elsewhere with behavioral disturbance: Secondary | ICD-10-CM | POA: Diagnosis not present

## 2018-09-12 DIAGNOSIS — F418 Other specified anxiety disorders: Secondary | ICD-10-CM | POA: Diagnosis not present

## 2018-09-12 DIAGNOSIS — F4322 Adjustment disorder with anxiety: Secondary | ICD-10-CM | POA: Diagnosis not present

## 2018-09-13 DIAGNOSIS — G309 Alzheimer's disease, unspecified: Secondary | ICD-10-CM | POA: Diagnosis not present

## 2018-09-14 DIAGNOSIS — G309 Alzheimer's disease, unspecified: Secondary | ICD-10-CM | POA: Diagnosis not present

## 2018-09-15 DIAGNOSIS — Z515 Encounter for palliative care: Secondary | ICD-10-CM | POA: Diagnosis not present

## 2018-09-15 DIAGNOSIS — G309 Alzheimer's disease, unspecified: Secondary | ICD-10-CM | POA: Diagnosis not present

## 2018-09-15 DIAGNOSIS — R630 Anorexia: Secondary | ICD-10-CM | POA: Diagnosis not present

## 2018-09-16 DIAGNOSIS — G309 Alzheimer's disease, unspecified: Secondary | ICD-10-CM | POA: Diagnosis not present

## 2018-09-17 DIAGNOSIS — G309 Alzheimer's disease, unspecified: Secondary | ICD-10-CM | POA: Diagnosis not present

## 2018-09-18 DIAGNOSIS — G309 Alzheimer's disease, unspecified: Secondary | ICD-10-CM | POA: Diagnosis not present

## 2018-09-19 DIAGNOSIS — G309 Alzheimer's disease, unspecified: Secondary | ICD-10-CM | POA: Diagnosis not present

## 2018-09-20 DIAGNOSIS — G309 Alzheimer's disease, unspecified: Secondary | ICD-10-CM | POA: Diagnosis not present

## 2018-09-21 DIAGNOSIS — G309 Alzheimer's disease, unspecified: Secondary | ICD-10-CM | POA: Diagnosis not present

## 2018-09-22 DIAGNOSIS — G309 Alzheimer's disease, unspecified: Secondary | ICD-10-CM | POA: Diagnosis not present

## 2018-09-23 DIAGNOSIS — Z79899 Other long term (current) drug therapy: Secondary | ICD-10-CM | POA: Diagnosis not present

## 2018-09-23 DIAGNOSIS — G309 Alzheimer's disease, unspecified: Secondary | ICD-10-CM | POA: Diagnosis not present

## 2018-09-24 DIAGNOSIS — G309 Alzheimer's disease, unspecified: Secondary | ICD-10-CM | POA: Diagnosis not present

## 2018-09-25 DIAGNOSIS — G309 Alzheimer's disease, unspecified: Secondary | ICD-10-CM | POA: Diagnosis not present

## 2018-09-26 DIAGNOSIS — G309 Alzheimer's disease, unspecified: Secondary | ICD-10-CM | POA: Diagnosis not present

## 2018-09-27 DIAGNOSIS — I10 Essential (primary) hypertension: Secondary | ICD-10-CM | POA: Diagnosis not present

## 2018-09-27 DIAGNOSIS — E039 Hypothyroidism, unspecified: Secondary | ICD-10-CM | POA: Diagnosis not present

## 2018-09-27 DIAGNOSIS — F0390 Unspecified dementia without behavioral disturbance: Secondary | ICD-10-CM | POA: Diagnosis not present

## 2018-09-27 DIAGNOSIS — F419 Anxiety disorder, unspecified: Secondary | ICD-10-CM | POA: Diagnosis not present

## 2018-09-27 DIAGNOSIS — R350 Frequency of micturition: Secondary | ICD-10-CM | POA: Diagnosis not present

## 2018-09-27 DIAGNOSIS — G309 Alzheimer's disease, unspecified: Secondary | ICD-10-CM | POA: Diagnosis not present

## 2018-09-28 DIAGNOSIS — I10 Essential (primary) hypertension: Secondary | ICD-10-CM | POA: Diagnosis not present

## 2018-09-28 DIAGNOSIS — G309 Alzheimer's disease, unspecified: Secondary | ICD-10-CM | POA: Diagnosis not present

## 2018-09-28 DIAGNOSIS — I509 Heart failure, unspecified: Secondary | ICD-10-CM | POA: Diagnosis not present

## 2018-09-28 DIAGNOSIS — F0280 Dementia in other diseases classified elsewhere without behavioral disturbance: Secondary | ICD-10-CM | POA: Diagnosis not present

## 2018-09-28 DIAGNOSIS — F419 Anxiety disorder, unspecified: Secondary | ICD-10-CM | POA: Diagnosis not present

## 2018-09-29 DIAGNOSIS — G309 Alzheimer's disease, unspecified: Secondary | ICD-10-CM | POA: Diagnosis not present

## 2018-09-30 DIAGNOSIS — G309 Alzheimer's disease, unspecified: Secondary | ICD-10-CM | POA: Diagnosis not present

## 2018-10-01 DIAGNOSIS — G309 Alzheimer's disease, unspecified: Secondary | ICD-10-CM | POA: Diagnosis not present

## 2018-10-02 DIAGNOSIS — G309 Alzheimer's disease, unspecified: Secondary | ICD-10-CM | POA: Diagnosis not present

## 2018-10-03 DIAGNOSIS — G309 Alzheimer's disease, unspecified: Secondary | ICD-10-CM | POA: Diagnosis not present

## 2018-10-04 DIAGNOSIS — K580 Irritable bowel syndrome with diarrhea: Secondary | ICD-10-CM | POA: Diagnosis not present

## 2018-10-04 DIAGNOSIS — I10 Essential (primary) hypertension: Secondary | ICD-10-CM | POA: Diagnosis not present

## 2018-10-04 DIAGNOSIS — F0390 Unspecified dementia without behavioral disturbance: Secondary | ICD-10-CM | POA: Diagnosis not present

## 2018-10-04 DIAGNOSIS — G309 Alzheimer's disease, unspecified: Secondary | ICD-10-CM | POA: Diagnosis not present

## 2018-10-04 DIAGNOSIS — N390 Urinary tract infection, site not specified: Secondary | ICD-10-CM | POA: Diagnosis not present

## 2018-10-05 DIAGNOSIS — G309 Alzheimer's disease, unspecified: Secondary | ICD-10-CM | POA: Diagnosis not present

## 2018-10-06 DIAGNOSIS — G309 Alzheimer's disease, unspecified: Secondary | ICD-10-CM | POA: Diagnosis not present

## 2018-10-07 DIAGNOSIS — G309 Alzheimer's disease, unspecified: Secondary | ICD-10-CM | POA: Diagnosis not present

## 2018-10-08 DIAGNOSIS — G309 Alzheimer's disease, unspecified: Secondary | ICD-10-CM | POA: Diagnosis not present

## 2018-10-09 DIAGNOSIS — G309 Alzheimer's disease, unspecified: Secondary | ICD-10-CM | POA: Diagnosis not present

## 2018-10-10 DIAGNOSIS — F418 Other specified anxiety disorders: Secondary | ICD-10-CM | POA: Diagnosis not present

## 2018-10-10 DIAGNOSIS — F321 Major depressive disorder, single episode, moderate: Secondary | ICD-10-CM | POA: Diagnosis not present

## 2018-10-10 DIAGNOSIS — F0281 Dementia in other diseases classified elsewhere with behavioral disturbance: Secondary | ICD-10-CM | POA: Diagnosis not present

## 2018-10-10 DIAGNOSIS — F4322 Adjustment disorder with anxiety: Secondary | ICD-10-CM | POA: Diagnosis not present

## 2018-10-10 DIAGNOSIS — G309 Alzheimer's disease, unspecified: Secondary | ICD-10-CM | POA: Diagnosis not present

## 2018-10-11 DIAGNOSIS — G309 Alzheimer's disease, unspecified: Secondary | ICD-10-CM | POA: Diagnosis not present

## 2018-10-12 DIAGNOSIS — G309 Alzheimer's disease, unspecified: Secondary | ICD-10-CM | POA: Diagnosis not present

## 2018-10-13 DIAGNOSIS — G309 Alzheimer's disease, unspecified: Secondary | ICD-10-CM | POA: Diagnosis not present

## 2018-10-14 DIAGNOSIS — G309 Alzheimer's disease, unspecified: Secondary | ICD-10-CM | POA: Diagnosis not present

## 2018-10-15 DIAGNOSIS — G309 Alzheimer's disease, unspecified: Secondary | ICD-10-CM | POA: Diagnosis not present

## 2018-10-16 DIAGNOSIS — G309 Alzheimer's disease, unspecified: Secondary | ICD-10-CM | POA: Diagnosis not present

## 2018-10-17 DIAGNOSIS — F0630 Mood disorder due to known physiological condition, unspecified: Secondary | ICD-10-CM | POA: Diagnosis not present

## 2018-10-17 DIAGNOSIS — Z79899 Other long term (current) drug therapy: Secondary | ICD-10-CM | POA: Diagnosis not present

## 2018-10-17 DIAGNOSIS — F419 Anxiety disorder, unspecified: Secondary | ICD-10-CM | POA: Diagnosis not present

## 2018-10-17 DIAGNOSIS — G309 Alzheimer's disease, unspecified: Secondary | ICD-10-CM | POA: Diagnosis not present

## 2018-10-18 DIAGNOSIS — F0390 Unspecified dementia without behavioral disturbance: Secondary | ICD-10-CM | POA: Diagnosis not present

## 2018-10-18 DIAGNOSIS — F419 Anxiety disorder, unspecified: Secondary | ICD-10-CM | POA: Diagnosis not present

## 2018-10-18 DIAGNOSIS — E039 Hypothyroidism, unspecified: Secondary | ICD-10-CM | POA: Diagnosis not present

## 2018-10-18 DIAGNOSIS — I10 Essential (primary) hypertension: Secondary | ICD-10-CM | POA: Diagnosis not present

## 2018-10-18 DIAGNOSIS — G309 Alzheimer's disease, unspecified: Secondary | ICD-10-CM | POA: Diagnosis not present

## 2018-10-19 DIAGNOSIS — G309 Alzheimer's disease, unspecified: Secondary | ICD-10-CM | POA: Diagnosis not present

## 2018-10-20 DIAGNOSIS — G309 Alzheimer's disease, unspecified: Secondary | ICD-10-CM | POA: Diagnosis not present

## 2018-10-21 DIAGNOSIS — G309 Alzheimer's disease, unspecified: Secondary | ICD-10-CM | POA: Diagnosis not present

## 2018-10-22 DIAGNOSIS — G309 Alzheimer's disease, unspecified: Secondary | ICD-10-CM | POA: Diagnosis not present

## 2018-10-23 DIAGNOSIS — G309 Alzheimer's disease, unspecified: Secondary | ICD-10-CM | POA: Diagnosis not present

## 2018-10-24 DIAGNOSIS — G309 Alzheimer's disease, unspecified: Secondary | ICD-10-CM | POA: Diagnosis not present

## 2018-10-25 DIAGNOSIS — G309 Alzheimer's disease, unspecified: Secondary | ICD-10-CM | POA: Diagnosis not present

## 2018-10-25 DIAGNOSIS — J029 Acute pharyngitis, unspecified: Secondary | ICD-10-CM | POA: Diagnosis not present

## 2018-10-25 DIAGNOSIS — F0390 Unspecified dementia without behavioral disturbance: Secondary | ICD-10-CM | POA: Diagnosis not present

## 2018-10-25 DIAGNOSIS — F419 Anxiety disorder, unspecified: Secondary | ICD-10-CM | POA: Diagnosis not present

## 2018-10-25 DIAGNOSIS — I10 Essential (primary) hypertension: Secondary | ICD-10-CM | POA: Diagnosis not present

## 2018-10-26 DIAGNOSIS — G309 Alzheimer's disease, unspecified: Secondary | ICD-10-CM | POA: Diagnosis not present

## 2018-10-27 DIAGNOSIS — I10 Essential (primary) hypertension: Secondary | ICD-10-CM | POA: Diagnosis not present

## 2018-10-27 DIAGNOSIS — G309 Alzheimer's disease, unspecified: Secondary | ICD-10-CM | POA: Diagnosis not present

## 2018-10-27 DIAGNOSIS — F419 Anxiety disorder, unspecified: Secondary | ICD-10-CM | POA: Diagnosis not present

## 2018-10-27 DIAGNOSIS — F0280 Dementia in other diseases classified elsewhere without behavioral disturbance: Secondary | ICD-10-CM | POA: Diagnosis not present

## 2018-10-27 DIAGNOSIS — I509 Heart failure, unspecified: Secondary | ICD-10-CM | POA: Diagnosis not present

## 2018-10-28 DIAGNOSIS — G309 Alzheimer's disease, unspecified: Secondary | ICD-10-CM | POA: Diagnosis not present

## 2018-10-29 DIAGNOSIS — G309 Alzheimer's disease, unspecified: Secondary | ICD-10-CM | POA: Diagnosis not present

## 2018-11-06 DIAGNOSIS — Z23 Encounter for immunization: Secondary | ICD-10-CM | POA: Diagnosis not present

## 2018-11-06 DIAGNOSIS — G309 Alzheimer's disease, unspecified: Secondary | ICD-10-CM | POA: Diagnosis not present

## 2018-11-06 DIAGNOSIS — F419 Anxiety disorder, unspecified: Secondary | ICD-10-CM | POA: Diagnosis not present

## 2018-11-06 DIAGNOSIS — F0390 Unspecified dementia without behavioral disturbance: Secondary | ICD-10-CM | POA: Diagnosis not present

## 2018-11-06 DIAGNOSIS — I10 Essential (primary) hypertension: Secondary | ICD-10-CM | POA: Diagnosis not present

## 2018-11-07 DIAGNOSIS — G309 Alzheimer's disease, unspecified: Secondary | ICD-10-CM | POA: Diagnosis not present

## 2018-11-07 DIAGNOSIS — F418 Other specified anxiety disorders: Secondary | ICD-10-CM | POA: Diagnosis not present

## 2018-11-07 DIAGNOSIS — F321 Major depressive disorder, single episode, moderate: Secondary | ICD-10-CM | POA: Diagnosis not present

## 2018-11-07 DIAGNOSIS — F4322 Adjustment disorder with anxiety: Secondary | ICD-10-CM | POA: Diagnosis not present

## 2018-11-07 DIAGNOSIS — F0281 Dementia in other diseases classified elsewhere with behavioral disturbance: Secondary | ICD-10-CM | POA: Diagnosis not present

## 2018-11-08 DIAGNOSIS — G309 Alzheimer's disease, unspecified: Secondary | ICD-10-CM | POA: Diagnosis not present

## 2018-11-09 DIAGNOSIS — G309 Alzheimer's disease, unspecified: Secondary | ICD-10-CM | POA: Diagnosis not present

## 2018-11-10 ENCOUNTER — Non-Acute Institutional Stay: Payer: Medicare HMO | Admitting: Student

## 2018-11-10 VITALS — BP 118/70 | HR 72 | Resp 20 | Wt 141.8 lb

## 2018-11-10 DIAGNOSIS — Z515 Encounter for palliative care: Secondary | ICD-10-CM

## 2018-11-10 DIAGNOSIS — G309 Alzheimer's disease, unspecified: Secondary | ICD-10-CM | POA: Diagnosis not present

## 2018-11-10 NOTE — Progress Notes (Signed)
[facility] Telephone: [phone] Fax: [phone]  PATIENT NAME: [name] DOB: Apr 16, 1930 MRN: [id number]  PRIMARY CARE PROVIDER:   [facility]  REFERRING PROVIDER:  [facility] [address] [place], [place] [address] Voice: [phone] Fax: [phone]  RESPONSIBLE PARTY:  husband Shirl Finnicum at [phone] or [phone]   ASSESSMENT: [name] is alert and oriented to person, familiars. She is observed ambulating about nursing unit. No acute distress. Pleasant affect; no anxiety or agitation observed during visit.     RECOMMENDATIONS and PLAN:  1. CODE STATUS. DNR is already in place, on chart. Continued discussion regarding desire for comfortable death including details regarding no heroic or extraordinary measures will be used to keep the patient alive.  2. Medical goals of therapy: At this time goals of therapy are focused on comfort and symptom management. Will continue to follow and refer for Hospice admission assessment when patient meets criteria per guidelines.  3. Symptom management: currently no unmanaged symptoms.  4. Discharge Planning: Patient will continue to reside at [facility]. 5. Emotional/spiritual support: Discussed with med tech Tiana; she is encouraged to call with questions.  Palliative Care to continue to follow for emotional/spiritual support, ongoing discussions trajectory of chronic disease progression, medical goals of therapy, monitor for symptoms with management, and reduce ED and hospitalizations with recommendations.  Palliative Medicine to follow up in 8 weeks or sooner, if needed.    I spent 15 minutes providing this consultation,  from 10:40AM to 10:55AM. More than 50% of the time in this consultation was spent coordinating communication.   HISTORY OF PRESENT ILLNESS:  [name] is a 84 y.o. year old female with multiple medical problems including Alzheimer's disease,  chronic obstructive pulmonary disease, coronary artery disease, cardiomyopathy, EF of 35%, hypertension, chronic atrial fibrillation on anticoagulant, diabetes, anxiety, hypothyroidism, irritable bowel syndrome, ataxia, post-polio syndrome.  Palliative Care was asked to help address ongoing discussion of medical goals of therapy, chronic disease processes, symptom management, ongoing monitoring of stability or decline, emotional and spiritual support [name] resides at [facility] living on locked memory unit. Staff report patient doing well overall, but states she is agitated at times. No recent falls, emergency room visits or hospitalizations. Mr. Grosso present and states she has been doing better. He reports a history of urinary tract infections, but states this has improved. No recent infections. No new medications reported.   CODE STATUS: DNR  PPS: 40% HOSPICE ELIGIBILITY/DIAGNOSIS: TBD  PAST MEDICAL HISTORY:  Past Medical History:  Diagnosis Date  . A-fib (HCC)   . Anxiety   . Arthritis   . Bronchitis 02/2016  . Coronary artery disease 12/2007  . Diabetes mellitus    x 10 yrs at least  . History of stress test 08/2011   negative for ischemia it was nongated and nondiagnostic and compatible with a possible scar or peri-infraction ischemia.   Marland Kitchen Hx of echocardiogram 09/2012   showed Ef 35-40% with no significant valve disease except for mild to moderate MR. normal RVSP and left atrium was severly dilated.  . Hypertension   . Pacemaker   . Polio   . Stroke (HCC)   . Thyroid disease    hypothyroidism    SOCIAL HX:  Social History   Tobacco Use  . Smoking status: Never Smoker  . Smokeless tobacco: Never Used  Substance Use Topics  . Alcohol use: No    Alcohol/week: 0.0 standard drinks  ALLERGIES:  Allergies  Allergen Reactions  . Iohexol Other (See Comments)     Desc: CHEST PAIN, LOC AFTER HEART CATH, PT REFUSED DYE   . Penicillins Other (See Comments)     Has patient had a PCN reaction causing immediate rash, facial/tongue/throat swelling, SOB or lightheadedness with hypotension: No Has patient had a PCN reaction causing severe rash involving mucus membranes or skin necrosis: Unknown Has patient had a PCN reaction that required hospitalization: Unknown Has patient had a PCN reaction occurring within the last 10 years: Unknown If all of the above answers are "NO", then may proceed with Cephalosporin use.   . Sulfa Antibiotics Other (See Comments)    Told not to take med     PERTINENT MEDICATIONS:  Outpatient Encounter Medications as of 11/10/2018  Medication Sig  . ALPRAZolam (XANAX) 0.25 MG tablet Take 1 tablet (0.25 mg total) by mouth 2 (two) times daily. (Patient taking differently: Take 0.5 mg by mouth 2 (two) times daily. )  . apixaban (ELIQUIS) 2.5 MG TABS tablet Take 2.5 mg by mouth 2 (two) times daily.  . benzonatate (TESSALON) 100 MG capsule Take 100 mg by mouth 3 (three) times daily as needed for cough.  . budesonide-formoterol (SYMBICORT) 160-4.5 MCG/ACT inhaler Inhale 2 puffs into the lungs 2 (two) times daily.  Marland Kitchen dextromethorphan-guaiFENesin (MUCINEX DM) 30-600 MG 12hr tablet Take 1 tablet by mouth 2 (two) times daily.  . digoxin (DIGOX) 0.125 MG tablet Take 1 tablet (125 mcg total) by mouth daily. Patient needs to call our office to schedule an appointment with Dr Tresa Endo for future refills.  . diphenoxylate-atropine (LOMOTIL) 2.5-0.025 MG tablet Take 1 tablet by mouth daily as needed for diarrhea or loose stools.  . divalproex (DEPAKOTE SPRINKLE) 125 MG capsule Take 125 mg by mouth 2 (two) times daily.  Marland Kitchen donepezil (ARICEPT) 5 MG tablet Take 5 mg by mouth at bedtime.  Marland Kitchen FLUoxetine (PROZAC) 20 MG capsule Take 20 mg by mouth daily.  . isosorbide mononitrate (IMDUR) 30 MG 24 hr tablet Take 1 tablet (30 mg total) by mouth daily. Patient needs to call our office to schedule an appointment with Dr Tresa Endo for future refills. (Patient  taking differently: Take 30 mg by mouth daily. )  . levothyroxine (SYNTHROID, LEVOTHROID) 100 MCG tablet TAKE 1 TABLET BY MOUTH EVERY DAY  . loperamide (IMODIUM) 2 MG capsule Take 2 mg by mouth daily as needed for diarrhea or loose stools.   . Melatonin 1 MG TABS Take 1 tablet by mouth at bedtime.   . memantine (NAMENDA) 5 MG tablet Take 5 mg by mouth 2 (two) times daily.  . metoprolol (LOPRESSOR) 50 MG tablet Take 1 tablet (50 mg total) by mouth twice daily. Patient needs to call our office to schedule an appointment with Dr Tresa Endo for refills. (Patient taking differently: Take 25 mg by mouth 2 (two) times daily. )  . mirtazapine (REMERON) 7.5 MG tablet Take 7.5 mg by mouth at bedtime.  . NONFORMULARY OR COMPOUNDED ITEM Estradiol 0.02 % 1ml prefilled applicator Sig: apply twice a week  . pravastatin (PRAVACHOL) 20 MG tablet Take 20 mg by mouth daily.  . Probiotic Product (ALIGN) 4 MG CAPS Take 1 capsule by mouth daily.  . PSYLLIUM FIBER PO Take 2 capsules by mouth daily.  . traMADol (ULTRAM) 50 MG tablet Take 25 mg by mouth daily as needed.   . Amino Acids-Protein Hydrolys (FEEDING SUPPLEMENT, PRO-STAT SUGAR FREE 64,) LIQD Take 30 mLs by mouth daily.  Marland Kitchen  guaiFENesin-dextromethorphan (ROBITUSSIN DM) 100-10 MG/5ML syrup Take 10 mLs by mouth every 6 (six) hours. (Patient not taking: Reported on 04/14/2018)  . Ipratropium-Albuterol (COMBIVENT RESPIMAT) 20-100 MCG/ACT AERS respimat Inhale 1 puff into the lungs every 6 (six) hours.  Marland Kitchen warfarin (COUMADIN) 2.5 MG tablet TAKE 1 TABLET BY MOUTH DAILY AS DIRECTED BY COUMADIN CLINIC (Patient not taking: Reported on 04/14/2018)   No facility-administered encounter medications on file as of 11/10/2018.     PHYSICAL EXAM:   General: NAD, frail, thin Cardiovascular: regular rate and rhythm Pulmonary: clear ant fields Abdomen: soft, nontender, + bowel sounds Extremities: no edema, no joint deformities Skin: no rashes Neurological: Weakness but otherwise  nonfocal  Luella Cook, NP

## 2018-11-11 DIAGNOSIS — G309 Alzheimer's disease, unspecified: Secondary | ICD-10-CM | POA: Diagnosis not present

## 2018-11-12 DIAGNOSIS — G309 Alzheimer's disease, unspecified: Secondary | ICD-10-CM | POA: Diagnosis not present

## 2018-11-13 DIAGNOSIS — G309 Alzheimer's disease, unspecified: Secondary | ICD-10-CM | POA: Diagnosis not present

## 2018-11-14 DIAGNOSIS — G309 Alzheimer's disease, unspecified: Secondary | ICD-10-CM | POA: Diagnosis not present

## 2018-11-15 DIAGNOSIS — G309 Alzheimer's disease, unspecified: Secondary | ICD-10-CM | POA: Diagnosis not present

## 2018-11-16 ENCOUNTER — Ambulatory Visit (INDEPENDENT_AMBULATORY_CARE_PROVIDER_SITE_OTHER): Payer: Medicare HMO | Admitting: Podiatry

## 2018-11-16 ENCOUNTER — Encounter: Payer: Self-pay | Admitting: Podiatry

## 2018-11-16 VITALS — BP 132/71 | HR 77

## 2018-11-16 DIAGNOSIS — M79674 Pain in right toe(s): Secondary | ICD-10-CM | POA: Diagnosis not present

## 2018-11-16 DIAGNOSIS — E119 Type 2 diabetes mellitus without complications: Secondary | ICD-10-CM

## 2018-11-16 DIAGNOSIS — B351 Tinea unguium: Secondary | ICD-10-CM | POA: Diagnosis not present

## 2018-11-16 DIAGNOSIS — D689 Coagulation defect, unspecified: Secondary | ICD-10-CM

## 2018-11-16 DIAGNOSIS — M79675 Pain in left toe(s): Secondary | ICD-10-CM

## 2018-11-16 DIAGNOSIS — G309 Alzheimer's disease, unspecified: Secondary | ICD-10-CM | POA: Diagnosis not present

## 2018-11-16 NOTE — Progress Notes (Signed)
This patient presents the office with chief complaint of long thick painful nails both feet.  She is brought into the office by her husband who says she is staying in a home.  He says he is experiencing heart disease.  She says that the nails are long thick and painful which causes pain when she walks and wears her shoes.  She is taking Eliquis.  She presents the office today for an evaluation and treatment of the long thick nails.  General Appearance  Alert, conversant and in no acute stress.  Vascular  Dorsalis pedis and posterior tibial  pulses are palpable  bilaterally.  Capillary return is within normal limits  bilaterally. Temperature is within normal limits  bilaterally.  Neurologic  Senn-Weinstein monofilament wire test within normal limits  bilaterally. Muscle power within normal limits bilaterally.  Nails Thick disfigured discolored nails with subungual debris  from hallux to fifth toes bilaterally. No evidence of bacterial infection or drainage bilaterally.  Orthopedic  No limitations of motion  feet .  No crepitus or effusions noted.  No bony pathology or digital deformities noted.  Skin  normotropic skin with no porokeratosis noted bilaterally.  No signs of infections or ulcers noted.    Onychomycosis  B/L  IE  Debride nails  X 10.  RTC 3 months.  Husband asked that we set up a 35-month appointment which would cause the facility to bring her to the office since he is concerned he may not be able to bring her himself  Helane Gunther DPM

## 2018-11-17 DIAGNOSIS — G309 Alzheimer's disease, unspecified: Secondary | ICD-10-CM | POA: Diagnosis not present

## 2018-11-18 DIAGNOSIS — G309 Alzheimer's disease, unspecified: Secondary | ICD-10-CM | POA: Diagnosis not present

## 2018-11-19 DIAGNOSIS — G309 Alzheimer's disease, unspecified: Secondary | ICD-10-CM | POA: Diagnosis not present

## 2018-11-20 DIAGNOSIS — G309 Alzheimer's disease, unspecified: Secondary | ICD-10-CM | POA: Diagnosis not present

## 2018-11-21 DIAGNOSIS — G309 Alzheimer's disease, unspecified: Secondary | ICD-10-CM | POA: Diagnosis not present

## 2018-11-22 DIAGNOSIS — G309 Alzheimer's disease, unspecified: Secondary | ICD-10-CM | POA: Diagnosis not present

## 2018-11-23 DIAGNOSIS — F0390 Unspecified dementia without behavioral disturbance: Secondary | ICD-10-CM | POA: Diagnosis not present

## 2018-11-23 DIAGNOSIS — E039 Hypothyroidism, unspecified: Secondary | ICD-10-CM | POA: Diagnosis not present

## 2018-11-23 DIAGNOSIS — G309 Alzheimer's disease, unspecified: Secondary | ICD-10-CM | POA: Diagnosis not present

## 2018-11-23 DIAGNOSIS — I10 Essential (primary) hypertension: Secondary | ICD-10-CM | POA: Diagnosis not present

## 2018-11-23 DIAGNOSIS — F419 Anxiety disorder, unspecified: Secondary | ICD-10-CM | POA: Diagnosis not present

## 2018-11-24 DIAGNOSIS — G309 Alzheimer's disease, unspecified: Secondary | ICD-10-CM | POA: Diagnosis not present

## 2018-11-25 DIAGNOSIS — G309 Alzheimer's disease, unspecified: Secondary | ICD-10-CM | POA: Diagnosis not present

## 2018-11-26 DIAGNOSIS — G309 Alzheimer's disease, unspecified: Secondary | ICD-10-CM | POA: Diagnosis not present

## 2018-11-27 DIAGNOSIS — R410 Disorientation, unspecified: Secondary | ICD-10-CM | POA: Diagnosis not present

## 2018-11-27 DIAGNOSIS — F0391 Unspecified dementia with behavioral disturbance: Secondary | ICD-10-CM | POA: Diagnosis not present

## 2018-11-27 DIAGNOSIS — F419 Anxiety disorder, unspecified: Secondary | ICD-10-CM | POA: Diagnosis not present

## 2018-11-27 DIAGNOSIS — G309 Alzheimer's disease, unspecified: Secondary | ICD-10-CM | POA: Diagnosis not present

## 2018-11-27 DIAGNOSIS — I10 Essential (primary) hypertension: Secondary | ICD-10-CM | POA: Diagnosis not present

## 2018-11-27 DIAGNOSIS — E039 Hypothyroidism, unspecified: Secondary | ICD-10-CM | POA: Diagnosis not present

## 2018-11-28 DIAGNOSIS — I509 Heart failure, unspecified: Secondary | ICD-10-CM | POA: Diagnosis not present

## 2018-11-28 DIAGNOSIS — I10 Essential (primary) hypertension: Secondary | ICD-10-CM | POA: Diagnosis not present

## 2018-11-28 DIAGNOSIS — E039 Hypothyroidism, unspecified: Secondary | ICD-10-CM | POA: Diagnosis not present

## 2018-11-28 DIAGNOSIS — F0390 Unspecified dementia without behavioral disturbance: Secondary | ICD-10-CM | POA: Diagnosis not present

## 2018-11-28 DIAGNOSIS — F419 Anxiety disorder, unspecified: Secondary | ICD-10-CM | POA: Diagnosis not present

## 2018-11-28 DIAGNOSIS — G309 Alzheimer's disease, unspecified: Secondary | ICD-10-CM | POA: Diagnosis not present

## 2018-11-29 DIAGNOSIS — G309 Alzheimer's disease, unspecified: Secondary | ICD-10-CM | POA: Diagnosis not present

## 2018-11-30 DIAGNOSIS — G309 Alzheimer's disease, unspecified: Secondary | ICD-10-CM | POA: Diagnosis not present

## 2018-12-01 DIAGNOSIS — G309 Alzheimer's disease, unspecified: Secondary | ICD-10-CM | POA: Diagnosis not present

## 2018-12-02 DIAGNOSIS — G309 Alzheimer's disease, unspecified: Secondary | ICD-10-CM | POA: Diagnosis not present

## 2018-12-03 DIAGNOSIS — G309 Alzheimer's disease, unspecified: Secondary | ICD-10-CM | POA: Diagnosis not present

## 2018-12-04 DIAGNOSIS — G309 Alzheimer's disease, unspecified: Secondary | ICD-10-CM | POA: Diagnosis not present

## 2018-12-04 DIAGNOSIS — R300 Dysuria: Secondary | ICD-10-CM | POA: Diagnosis not present

## 2018-12-04 DIAGNOSIS — N390 Urinary tract infection, site not specified: Secondary | ICD-10-CM | POA: Diagnosis not present

## 2018-12-05 DIAGNOSIS — G309 Alzheimer's disease, unspecified: Secondary | ICD-10-CM | POA: Diagnosis not present

## 2018-12-06 DIAGNOSIS — G309 Alzheimer's disease, unspecified: Secondary | ICD-10-CM | POA: Diagnosis not present

## 2018-12-07 DIAGNOSIS — G309 Alzheimer's disease, unspecified: Secondary | ICD-10-CM | POA: Diagnosis not present

## 2018-12-08 DIAGNOSIS — G309 Alzheimer's disease, unspecified: Secondary | ICD-10-CM | POA: Diagnosis not present

## 2018-12-09 DIAGNOSIS — G309 Alzheimer's disease, unspecified: Secondary | ICD-10-CM | POA: Diagnosis not present

## 2018-12-10 DIAGNOSIS — G309 Alzheimer's disease, unspecified: Secondary | ICD-10-CM | POA: Diagnosis not present

## 2018-12-11 DIAGNOSIS — F419 Anxiety disorder, unspecified: Secondary | ICD-10-CM | POA: Diagnosis not present

## 2018-12-11 DIAGNOSIS — I10 Essential (primary) hypertension: Secondary | ICD-10-CM | POA: Diagnosis not present

## 2018-12-11 DIAGNOSIS — J449 Chronic obstructive pulmonary disease, unspecified: Secondary | ICD-10-CM | POA: Diagnosis not present

## 2018-12-11 DIAGNOSIS — F0151 Vascular dementia with behavioral disturbance: Secondary | ICD-10-CM | POA: Diagnosis not present

## 2018-12-11 DIAGNOSIS — R05 Cough: Secondary | ICD-10-CM | POA: Diagnosis not present

## 2018-12-11 DIAGNOSIS — G309 Alzheimer's disease, unspecified: Secondary | ICD-10-CM | POA: Diagnosis not present

## 2018-12-11 DIAGNOSIS — I482 Chronic atrial fibrillation, unspecified: Secondary | ICD-10-CM | POA: Diagnosis not present

## 2018-12-11 DIAGNOSIS — J069 Acute upper respiratory infection, unspecified: Secondary | ICD-10-CM | POA: Diagnosis not present

## 2018-12-12 DIAGNOSIS — F321 Major depressive disorder, single episode, moderate: Secondary | ICD-10-CM | POA: Diagnosis not present

## 2018-12-12 DIAGNOSIS — F418 Other specified anxiety disorders: Secondary | ICD-10-CM | POA: Diagnosis not present

## 2018-12-12 DIAGNOSIS — G309 Alzheimer's disease, unspecified: Secondary | ICD-10-CM | POA: Diagnosis not present

## 2018-12-12 DIAGNOSIS — F4322 Adjustment disorder with anxiety: Secondary | ICD-10-CM | POA: Diagnosis not present

## 2018-12-13 DIAGNOSIS — G309 Alzheimer's disease, unspecified: Secondary | ICD-10-CM | POA: Diagnosis not present

## 2018-12-14 DIAGNOSIS — G309 Alzheimer's disease, unspecified: Secondary | ICD-10-CM | POA: Diagnosis not present

## 2018-12-15 DIAGNOSIS — G309 Alzheimer's disease, unspecified: Secondary | ICD-10-CM | POA: Diagnosis not present

## 2018-12-16 DIAGNOSIS — G309 Alzheimer's disease, unspecified: Secondary | ICD-10-CM | POA: Diagnosis not present

## 2018-12-17 DIAGNOSIS — G309 Alzheimer's disease, unspecified: Secondary | ICD-10-CM | POA: Diagnosis not present

## 2018-12-18 ENCOUNTER — Non-Acute Institutional Stay: Payer: Medicare HMO | Admitting: Student

## 2018-12-18 VITALS — BP 132/90 | HR 68 | Resp 20 | Wt 142.0 lb

## 2018-12-18 DIAGNOSIS — F0390 Unspecified dementia without behavioral disturbance: Secondary | ICD-10-CM | POA: Diagnosis not present

## 2018-12-18 DIAGNOSIS — F411 Generalized anxiety disorder: Secondary | ICD-10-CM | POA: Diagnosis not present

## 2018-12-18 DIAGNOSIS — I10 Essential (primary) hypertension: Secondary | ICD-10-CM | POA: Diagnosis not present

## 2018-12-18 DIAGNOSIS — Z515 Encounter for palliative care: Secondary | ICD-10-CM | POA: Diagnosis not present

## 2018-12-18 DIAGNOSIS — I4811 Longstanding persistent atrial fibrillation: Secondary | ICD-10-CM | POA: Diagnosis not present

## 2018-12-18 NOTE — Progress Notes (Signed)
[facility] Telephone: [phone] Fax: [phone]  PATIENT NAME: [name] DOB: 10/30/1930 MRN: [id number]  PRIMARY CARE PROVIDER:   [facility]  REFERRING PROVIDER:  [facility] RESPONSIBLE PARTY: Husband, Clarnce Flock   ASSESSMENT: [name] is observed sitting at a table working on a puzzle with a staff member. She has a pleasant affect. No acute distress noted. She answers direct questions. She talks about having to work hard and is getting tired. No anxiety or agitation observed during visit. Discussed goals of care and symptom management with med tech [employer].       RECOMMENDATIONS and PLAN:  1. CODE STATUS. DNR is already in place, on chart.   2. Medical goals of therapy: At this time goals of therapy are focused on comfort and symptom management. Will continue to follow and refer for Hospice admission assessment when patient meets criteria per guidelines.  3. Symptom management: urinary tract infection-continue cefixime 400mg  x 14 days. Cough-mucinex DM 30-600mg  one tablet every 12 hours prn cough/congestion. Anxiety/agitation-alprazolam 0.25mg  BID and 0.5mg  daily prn.  4. Discharge Planning: Patient will continue to reside at [facility]. 5. Emotional/spiritual support: Discussed with med tech Victorino Dike; she is encouraged to call with questions.   Will provide Mr. Brandi an update via phone.   Palliative Care to continue to follow for emotional/spiritual support, ongoing discussions trajectory of chronic disease progression, medical goals of therapy, monitor for symptoms with management, and reduce ED and hospitalizations with recommendations.   I spent 15 minutes providing this consultation,  from 11:30am to 11:45am. More than 50% of the time in this consultation was spent coordinating communication.   HISTORY OF PRESENT ILLNESS:  [name] is a 85 y.o.  female with multiple medical problems including Alzheimer's disease, chronic  obstructive pulmonary disease, coronary artery disease, cardiomyopathy,EF of 35%, hypertension, chronic atrial fibrillation on anticoagulant,diabetes, anxiety, hypothyroidism, irritable bowel syndrome, ataxia, post-polio syndrome. Palliative Care was asked to help address ongoing discussion of medical goals of therapy, chronic disease processes, symptom management, ongoing monitoring of stability or decline, provide emotional and spiritual support. [name] resides at [facility] living on locked memory unit. Facility med tech Victorino Dike reports [name] currently receiving antibiotics for a urinary tract infection. She is receiving cefixime 400mg  daily x 14 days. She has occasioanal agitation; she receives scheduled and prn alprazolam. Her appetite is fair; she requires direction to eat in dining room. She denies pain or shortness of breath. She reports being tired today. No recent falls, emergency room visits or hospitalizations. She has an occasional cough; cough and congestion reported on 12/11/18. No other new medications reported.  CODE STATUS: DNR  PPS: 40% HOSPICE ELIGIBILITY/DIAGNOSIS: TBD  PAST MEDICAL HISTORY:  Past Medical History:  Diagnosis Date  . A-fib (HCC)   . Anxiety   . Arthritis   . Bronchitis 02/2016  . Coronary artery disease 12/2007  . Diabetes mellitus    x 10 yrs at least  . History of stress test 08/2011   negative for ischemia it was nongated and nondiagnostic and compatible with a possible scar or peri-infraction ischemia.   Marland Kitchen Hx of echocardiogram 09/2012   showed Ef 35-40% with no significant valve disease except for mild to moderate MR. normal RVSP and left atrium was severly dilated.  . Hypertension   . Pacemaker   . Polio   . Stroke (HCC)   . Thyroid disease    hypothyroidism    SOCIAL HX:  Social History  Tobacco Use  . Smoking status: Never Smoker  . Smokeless tobacco: Never Used  Substance Use Topics  . Alcohol use: No     Alcohol/week: 0.0 standard drinks    ALLERGIES:  Allergies  Allergen Reactions  . Iohexol Other (See Comments)     Desc: CHEST PAIN, LOC AFTER HEART CATH, PT REFUSED DYE  Contrast Dye / Desc: CHEST PAIN, LOC AFTER HEART CATH, PT REFUSED DYE  . Penicillins Other (See Comments)    Has patient had a PCN reaction causing immediate rash, facial/tongue/throat swelling, SOB or lightheadedness with hypotension: No Has patient had a PCN reaction causing severe rash involving mucus membranes or skin necrosis: Unknown Has patient had a PCN reaction that required hospitalization: Unknown Has patient had a PCN reaction occurring within the last 10 years: Unknown If all of the above answers are "NO", then may proceed with Cephalosporin use.  Told not to take medication   . Sulfa Antibiotics Other (See Comments)    Told not to take med  . Sulfasalazine Other (See Comments)    Told not to take med  . Iodine Other (See Comments)     PERTINENT MEDICATIONS:  Outpatient Encounter Medications as of 12/18/2018  Medication Sig  . acetaminophen (TYLENOL) 500 MG tablet Take 500 mg by mouth every 4 (four) hours as needed.  . ALPRAZolam (XANAX) 0.25 MG tablet Take 1 tablet (0.25 mg total) by mouth 2 (two) times daily.  Marland Kitchen ALPRAZolam (XANAX) 0.5 MG tablet Take 0.5 mg by mouth daily as needed for anxiety.  Marland Kitchen alum & mag hydroxide-simeth (MAALOX PLUS) 400-400-40 MG/5ML suspension Take 15 mLs by mouth as needed for indigestion.  Marland Kitchen apixaban (ELIQUIS) 2.5 MG TABS tablet Take 2.5 mg by mouth 2 (two) times daily.  . budesonide-formoterol (SYMBICORT) 160-4.5 MCG/ACT inhaler Inhale 2 puffs into the lungs 2 (two) times daily.  Marland Kitchen Dextromethorphan-guaiFENesin (MUCINEX DM) 30-600 MG TB12   . digoxin (LANOXIN) 0.125 MG tablet 1 tablet  . divalproex (DEPAKOTE SPRINKLE) 125 MG capsule i capsule  . donepezil (ARICEPT) 5 MG tablet 1 tablets  . guaiFENesin (ROBITUSSIN) 100 MG/5ML liquid Take 200 mg by mouth every 6 (six) hours  as needed for cough.  . Ipratropium-Albuterol (COMBIVENT RESPIMAT) 20-100 MCG/ACT AERS respimat inhale 1 puff  . isosorbide mononitrate (IMDUR) 30 MG 24 hr tablet 1 tablet  . levothyroxine (SYNTHROID, LEVOTHROID) 100 MCG tablet 1 tablet  . loperamide (IMODIUM) 2 MG capsule 1 capsule  . magnesium hydroxide (MILK OF MAGNESIA) 400 MG/5ML suspension Take 30 mLs by mouth at bedtime as needed for mild constipation.  . metoprolol tartrate (LOPRESSOR) 25 MG tablet 1 tablet  . mirtazapine (REMERON) 15 MG tablet take 1 tablet  . neomycin-bacitracin-polymyxin (NEOSPORIN) [phone] ointment Apply 1 application topically as needed. For minor skin tears.  . pravastatin (PRAVACHOL) 20 MG tablet 1 tablet  . baclofen (LIORESAL) 10 MG tablet Take by mouth.  . diphenoxylate-atropine (LOMOTIL) 2.5-0.025 MG tablet 1 tablets  . NONFORMULARY OR COMPOUNDED ITEM Estradiol 0.02 % 1ml prefilled applicator Sig: apply twice a week  . Psyllium (METAMUCIL) WAFR as directed  . UNABLE TO FIND Take by mouth.  . Vitamins A & D (VITAMIN A & D) ointment Apply topically.  . [DISCONTINUED] Amino Acids-Protein Hydrolys (FEEDING SUPPLEMENT, PRO-STAT SUGAR FREE 64,) LIQD Take 30 mLs by mouth daily.  . [DISCONTINUED] benzonatate (TESSALON) 100 MG capsule Take 100 mg by mouth 3 (three) times daily as needed for cough.  . [DISCONTINUED] bifidobacterium infantis (ALIGN) capsule 1 tablet  . [  DISCONTINUED] FLUoxetine (PROZAC) 20 MG capsule Take 20 mg by mouth daily.  . [DISCONTINUED] Melatonin 1 MG TABS 1 tablet  . [DISCONTINUED] memantine (NAMENDA) 10 MG tablet 1 tablet  . [DISCONTINUED] nitrofurantoin, macrocrystal-monohydrate, (MACROBID) 100 MG capsule 1 capsule at bedtime  . [DISCONTINUED] traMADol (ULTRAM) 50 MG tablet Take 25 mg by mouth daily as needed.    No facility-administered encounter medications on file as of 12/18/2018.     PHYSICAL EXAM:   General: NAD, frail appearing, thin Cardiovascular: regular rate and  rhythm Pulmonary: clear ant fields Abdomen: soft, nontender, + bowel sounds GU: no suprapubic tenderness Extremities: trace edema, no joint deformities Skin: no rashes Neurological: Weakness but otherwise nonfocal  Luella CookLaToya S Yoon Barca, NP

## 2018-12-21 DIAGNOSIS — S90812A Abrasion, left foot, initial encounter: Secondary | ICD-10-CM | POA: Diagnosis not present

## 2018-12-21 DIAGNOSIS — I10 Essential (primary) hypertension: Secondary | ICD-10-CM | POA: Diagnosis not present

## 2018-12-21 DIAGNOSIS — I482 Chronic atrial fibrillation, unspecified: Secondary | ICD-10-CM | POA: Diagnosis not present

## 2018-12-21 DIAGNOSIS — F0391 Unspecified dementia with behavioral disturbance: Secondary | ICD-10-CM | POA: Diagnosis not present

## 2018-12-24 DIAGNOSIS — I10 Essential (primary) hypertension: Secondary | ICD-10-CM | POA: Diagnosis not present

## 2018-12-24 DIAGNOSIS — F411 Generalized anxiety disorder: Secondary | ICD-10-CM | POA: Diagnosis not present

## 2018-12-24 DIAGNOSIS — I4811 Longstanding persistent atrial fibrillation: Secondary | ICD-10-CM | POA: Diagnosis not present

## 2018-12-24 DIAGNOSIS — E039 Hypothyroidism, unspecified: Secondary | ICD-10-CM | POA: Diagnosis not present

## 2018-12-25 DIAGNOSIS — G309 Alzheimer's disease, unspecified: Secondary | ICD-10-CM | POA: Diagnosis not present

## 2018-12-26 DIAGNOSIS — G309 Alzheimer's disease, unspecified: Secondary | ICD-10-CM | POA: Diagnosis not present

## 2018-12-27 DIAGNOSIS — G309 Alzheimer's disease, unspecified: Secondary | ICD-10-CM | POA: Diagnosis not present

## 2018-12-28 DIAGNOSIS — I4811 Longstanding persistent atrial fibrillation: Secondary | ICD-10-CM | POA: Diagnosis not present

## 2018-12-28 DIAGNOSIS — I10 Essential (primary) hypertension: Secondary | ICD-10-CM | POA: Diagnosis not present

## 2018-12-28 DIAGNOSIS — G309 Alzheimer's disease, unspecified: Secondary | ICD-10-CM | POA: Diagnosis not present

## 2018-12-28 DIAGNOSIS — E039 Hypothyroidism, unspecified: Secondary | ICD-10-CM | POA: Diagnosis not present

## 2018-12-29 DIAGNOSIS — I509 Heart failure, unspecified: Secondary | ICD-10-CM | POA: Diagnosis not present

## 2018-12-29 DIAGNOSIS — E039 Hypothyroidism, unspecified: Secondary | ICD-10-CM | POA: Diagnosis not present

## 2018-12-29 DIAGNOSIS — F0391 Unspecified dementia with behavioral disturbance: Secondary | ICD-10-CM | POA: Diagnosis not present

## 2018-12-29 DIAGNOSIS — G309 Alzheimer's disease, unspecified: Secondary | ICD-10-CM | POA: Diagnosis not present

## 2018-12-29 DIAGNOSIS — I10 Essential (primary) hypertension: Secondary | ICD-10-CM | POA: Diagnosis not present

## 2018-12-30 DIAGNOSIS — G309 Alzheimer's disease, unspecified: Secondary | ICD-10-CM | POA: Diagnosis not present

## 2018-12-31 DIAGNOSIS — G309 Alzheimer's disease, unspecified: Secondary | ICD-10-CM | POA: Diagnosis not present

## 2019-01-01 DIAGNOSIS — G309 Alzheimer's disease, unspecified: Secondary | ICD-10-CM | POA: Diagnosis not present

## 2019-01-02 DIAGNOSIS — G309 Alzheimer's disease, unspecified: Secondary | ICD-10-CM | POA: Diagnosis not present

## 2019-01-03 DIAGNOSIS — G309 Alzheimer's disease, unspecified: Secondary | ICD-10-CM | POA: Diagnosis not present

## 2019-01-04 DIAGNOSIS — G309 Alzheimer's disease, unspecified: Secondary | ICD-10-CM | POA: Diagnosis not present

## 2019-01-05 DIAGNOSIS — G309 Alzheimer's disease, unspecified: Secondary | ICD-10-CM | POA: Diagnosis not present

## 2019-01-06 DIAGNOSIS — G309 Alzheimer's disease, unspecified: Secondary | ICD-10-CM | POA: Diagnosis not present

## 2019-01-07 DIAGNOSIS — G309 Alzheimer's disease, unspecified: Secondary | ICD-10-CM | POA: Diagnosis not present

## 2019-01-08 DIAGNOSIS — G309 Alzheimer's disease, unspecified: Secondary | ICD-10-CM | POA: Diagnosis not present

## 2019-01-09 DIAGNOSIS — G309 Alzheimer's disease, unspecified: Secondary | ICD-10-CM | POA: Diagnosis not present

## 2019-01-10 DIAGNOSIS — G309 Alzheimer's disease, unspecified: Secondary | ICD-10-CM | POA: Diagnosis not present

## 2019-01-11 DIAGNOSIS — I10 Essential (primary) hypertension: Secondary | ICD-10-CM | POA: Diagnosis not present

## 2019-01-11 DIAGNOSIS — E039 Hypothyroidism, unspecified: Secondary | ICD-10-CM | POA: Diagnosis not present

## 2019-01-11 DIAGNOSIS — I4811 Longstanding persistent atrial fibrillation: Secondary | ICD-10-CM | POA: Diagnosis not present

## 2019-01-11 DIAGNOSIS — G309 Alzheimer's disease, unspecified: Secondary | ICD-10-CM | POA: Diagnosis not present

## 2019-01-12 DIAGNOSIS — F418 Other specified anxiety disorders: Secondary | ICD-10-CM | POA: Diagnosis not present

## 2019-01-12 DIAGNOSIS — G309 Alzheimer's disease, unspecified: Secondary | ICD-10-CM | POA: Diagnosis not present

## 2019-01-12 DIAGNOSIS — F321 Major depressive disorder, single episode, moderate: Secondary | ICD-10-CM | POA: Diagnosis not present

## 2019-01-12 DIAGNOSIS — F4322 Adjustment disorder with anxiety: Secondary | ICD-10-CM | POA: Diagnosis not present

## 2019-01-13 DIAGNOSIS — G309 Alzheimer's disease, unspecified: Secondary | ICD-10-CM | POA: Diagnosis not present

## 2019-01-14 DIAGNOSIS — G309 Alzheimer's disease, unspecified: Secondary | ICD-10-CM | POA: Diagnosis not present

## 2019-01-15 DIAGNOSIS — G309 Alzheimer's disease, unspecified: Secondary | ICD-10-CM | POA: Diagnosis not present

## 2019-01-16 DIAGNOSIS — G309 Alzheimer's disease, unspecified: Secondary | ICD-10-CM | POA: Diagnosis not present

## 2019-01-17 ENCOUNTER — Non-Acute Institutional Stay: Payer: Medicare HMO | Admitting: Student

## 2019-01-17 VITALS — BP 140/80 | HR 80 | Resp 20

## 2019-01-17 DIAGNOSIS — Z515 Encounter for palliative care: Secondary | ICD-10-CM

## 2019-01-17 DIAGNOSIS — G309 Alzheimer's disease, unspecified: Secondary | ICD-10-CM | POA: Diagnosis not present

## 2019-01-17 NOTE — Progress Notes (Signed)
Therapist, nutritional Palliative Care Consult Note Telephone: [phone]  Fax: [phone]  PATIENT NAME: [name] DOB: 10-31-1930 MRN: [id number]  PRIMARY CARE PROVIDER:   [facility]  REFERRING PROVIDER:  [facility]  RESPONSIBLE PARTY: Husband, Clarnce Flock     ASSESSMENT: [name] is sitting in common area upon arrival. Pleasant affect; welcomes visit. No acute distress noted. She answers direct questions. No anxiety or agitation observed. Discussed ongoing goals of care with med tech's Victorino Dike and [place].      RECOMMENDATIONS and PLAN:  1. CODE STATUS. DNR is already in place, on chart.   2. Medical goals of therapy: At this time goals of therapy are focused on comfort and symptom management. Will continue to follow and refer for Hospice admission assessment when patient meets criteria per guidelines.  3. Symptom management: Anxiety/agitation-alprazolam 0.5mg  BID and 0.25mg  daily prn.  4. Discharge Planning: Patient will continue to reside at [facility]. 5. Emotional/spiritual support: Discussed with med techJennifer;she is encouraged to call with questions.   Attempted to reach [name] by phone to provide an update; one number no voicemail and the other number on file stated someone else's name therefore no message was left.   Palliative Care to continue to follow for emotional/spiritual support, ongoing discussions trajectory of chronic disease progression, medical goals of therapy, monitor for symptoms with management, and reduce ED and hospitalizations with recommendations.  Palliative Care will follow up in 6 weeks or sooner, if needed.   I spent 25 minutes providing this consultation,  from 10:30am  to 10:55am. More than 50% of the time in this consultation was spent coordinating communication.   HISTORY OF PRESENT ILLNESS:  [name] is a 85 y.o. female with multiple medical problems including Alzheimer's disease,  chronic obstructive pulmonary disease, coronary artery disease, cardiomyopathy,EF of 35%, hypertension, chronic atrial fibrillation on anticoagulant,diabetes, anxiety, hypothyroidism, irritable bowel syndrome, ataxia, post-polio syndrome.Palliative Care was asked to help address ongoing discussion of medical goals of therapy, chronic disease processes, symptom management, ongoing monitoring of stability or decline, provide emotional and spiritual support.[name] resides at [facility] on locked memory unit. [name] reports her nose bleeding this morning. Med tech Victorino Dike states this happened just after her shower and this has not occurred before. She has occasioanal agitation; she receives scheduled and prn alprazolam. Her appetite has been good per Victorino Dike; no weight loss reported. She denies pain or shortness of breath. No recent falls, emergency room visits or hospitalizations. New orders on 12/21/2018: alprazolam increased to 0.5mg  BID, prn alprazolam changed to 0.25mg  daily prn, depakote 125mg  2 caps BID, namenda 10mg  BID, zyprexa 2.5mg  daily, start melatonin 3mg  QHS. Palliative Care was asked to help address goals of care.   CODE STATUS: DNR  PPS: 40% HOSPICE ELIGIBILITY/DIAGNOSIS: TBD  PAST MEDICAL HISTORY:  Past Medical History:  Diagnosis Date  . A-fib (HCC)   . Anxiety   . Arthritis   . Bronchitis 02/2016  . Coronary artery disease 12/2007  . Diabetes mellitus    x 10 yrs at least  . History of stress test 08/2011   negative for ischemia it was nongated and nondiagnostic and compatible with a possible scar or peri-infraction ischemia.   Marland Kitchen Hx of echocardiogram 09/2012   showed Ef 35-40% with no significant valve disease except for mild to moderate MR. normal RVSP and left atrium was severly dilated.  . Hypertension   . Pacemaker   . Polio   .  Stroke (HCC)   . Thyroid disease    hypothyroidism    SOCIAL HX:  Social History   Tobacco Use  .  Smoking status: Never Smoker  . Smokeless tobacco: Never Used  Substance Use Topics  . Alcohol use: No    Alcohol/week: 0.0 standard drinks    ALLERGIES:  Allergies  Allergen Reactions  . Iohexol Other (See Comments)     Desc: CHEST PAIN, LOC AFTER HEART CATH, PT REFUSED DYE  Contrast Dye / Desc: CHEST PAIN, LOC AFTER HEART CATH, PT REFUSED DYE  . Penicillins Other (See Comments)    Has patient had a PCN reaction causing immediate rash, facial/tongue/throat swelling, SOB or lightheadedness with hypotension: No Has patient had a PCN reaction causing severe rash involving mucus membranes or skin necrosis: Unknown Has patient had a PCN reaction that required hospitalization: Unknown Has patient had a PCN reaction occurring within the last 10 years: Unknown If all of the above answers are "NO", then may proceed with Cephalosporin use.  Told not to take medication   . Sulfa Antibiotics Other (See Comments)    Told not to take med  . Sulfasalazine Other (See Comments)    Told not to take med  . Iodine Other (See Comments)     PERTINENT MEDICATIONS:  Outpatient Encounter Medications as of 01/17/2019  Medication Sig  . acetaminophen (TYLENOL) 500 MG tablet Take 500 mg by mouth every 4 (four) hours as needed.  . ALPRAZolam (XANAX) 0.25 MG tablet Take 1 tablet (0.25 mg total) by mouth 2 (two) times daily. (Patient taking differently: Take 0.25 mg by mouth daily as needed. )  . ALPRAZolam (XANAX) 0.5 MG tablet Take 0.5 mg by mouth 2 (two) times daily.   Marland Kitchen alum & mag hydroxide-simeth (MAALOX PLUS) 400-400-40 MG/5ML suspension Take 15 mLs by mouth as needed for indigestion.  Marland Kitchen apixaban (ELIQUIS) 2.5 MG TABS tablet Take 2.5 mg by mouth 2 (two) times daily.  . baclofen (LIORESAL) 10 MG tablet Take by mouth.  . budesonide-formoterol (SYMBICORT) 160-4.5 MCG/ACT inhaler Inhale 2 puffs into the lungs 2 (two) times daily.  . digoxin (LANOXIN) 0.125 MG tablet 1 tablet  . divalproex (DEPAKOTE  SPRINKLE) 125 MG capsule 250 mg 2 (two) times daily.   Marland Kitchen donepezil (ARICEPT) 5 MG tablet 1 tablets  . Ipratropium-Albuterol (COMBIVENT RESPIMAT) 20-100 MCG/ACT AERS respimat inhale 1 puff  . isosorbide mononitrate (IMDUR) 30 MG 24 hr tablet 1 tablet  . levothyroxine (SYNTHROID, LEVOTHROID) 100 MCG tablet 1 tablet  . loperamide (IMODIUM) 2 MG capsule 1 capsule  . magnesium hydroxide (MILK OF MAGNESIA) 400 MG/5ML suspension Take 30 mLs by mouth at bedtime as needed for mild constipation.  . memantine (NAMENDA) 10 MG tablet Take 10 mg by mouth 2 (two) times daily.  . metoprolol tartrate (LOPRESSOR) 25 MG tablet 1 tablet  . mirtazapine (REMERON) 15 MG tablet take 1 tablet  . neomycin-bacitracin-polymyxin (NEOSPORIN) [phone] ointment Apply 1 application topically as needed. For minor skin tears.  . NONFORMULARY OR COMPOUNDED ITEM Estradiol 0.02 % 80ml prefilled applicator Sig: apply twice a week  . OLANZapine (ZYPREXA) 2.5 MG tablet Take 2.5 mg by mouth at bedtime.  . pravastatin (PRAVACHOL) 20 MG tablet 1 tablet  . Dextromethorphan-guaiFENesin (MUCINEX DM) 30-600 MG TB12   . diphenoxylate-atropine (LOMOTIL) 2.5-0.025 MG tablet 1 tablets  . guaiFENesin (ROBITUSSIN) 100 MG/5ML liquid Take 200 mg by mouth every 6 (six) hours as needed for cough.  . Psyllium (METAMUCIL) WAFR as directed  .  UNABLE TO FIND Take by mouth.  . Vitamins A & D (VITAMIN A & D) ointment Apply topically.   No facility-administered encounter medications on file as of 01/17/2019.     PHYSICAL EXAM:   General: NAD, frail appearing, thin Cardiovascular: regular rate and rhythm Pulmonary: clear ant fields Abdomen: soft, nontender, + bowel sounds GU: no suprapubic tenderness Extremities: no edema, no joint deformities Skin: no rashes Neurological: Weakness but otherwise nonfocal  Luella Cook, NP

## 2019-01-18 DIAGNOSIS — G309 Alzheimer's disease, unspecified: Secondary | ICD-10-CM | POA: Diagnosis not present

## 2019-01-19 DIAGNOSIS — G309 Alzheimer's disease, unspecified: Secondary | ICD-10-CM | POA: Diagnosis not present

## 2019-01-20 DIAGNOSIS — G309 Alzheimer's disease, unspecified: Secondary | ICD-10-CM | POA: Diagnosis not present

## 2019-01-21 DIAGNOSIS — G309 Alzheimer's disease, unspecified: Secondary | ICD-10-CM | POA: Diagnosis not present

## 2019-01-22 DIAGNOSIS — G309 Alzheimer's disease, unspecified: Secondary | ICD-10-CM | POA: Diagnosis not present

## 2019-01-23 DIAGNOSIS — F0280 Dementia in other diseases classified elsewhere without behavioral disturbance: Secondary | ICD-10-CM | POA: Diagnosis not present

## 2019-01-23 DIAGNOSIS — I509 Heart failure, unspecified: Secondary | ICD-10-CM | POA: Diagnosis not present

## 2019-01-23 DIAGNOSIS — G309 Alzheimer's disease, unspecified: Secondary | ICD-10-CM | POA: Diagnosis not present

## 2019-01-23 DIAGNOSIS — F419 Anxiety disorder, unspecified: Secondary | ICD-10-CM | POA: Diagnosis not present

## 2019-01-24 DIAGNOSIS — G309 Alzheimer's disease, unspecified: Secondary | ICD-10-CM | POA: Diagnosis not present

## 2019-01-25 DIAGNOSIS — Z79899 Other long term (current) drug therapy: Secondary | ICD-10-CM | POA: Diagnosis not present

## 2019-01-25 DIAGNOSIS — G309 Alzheimer's disease, unspecified: Secondary | ICD-10-CM | POA: Diagnosis not present

## 2019-01-26 DIAGNOSIS — G309 Alzheimer's disease, unspecified: Secondary | ICD-10-CM | POA: Diagnosis not present

## 2019-01-27 DIAGNOSIS — G309 Alzheimer's disease, unspecified: Secondary | ICD-10-CM | POA: Diagnosis not present

## 2019-01-28 DIAGNOSIS — G309 Alzheimer's disease, unspecified: Secondary | ICD-10-CM | POA: Diagnosis not present

## 2019-01-29 DIAGNOSIS — G309 Alzheimer's disease, unspecified: Secondary | ICD-10-CM | POA: Diagnosis not present

## 2019-01-30 DIAGNOSIS — G309 Alzheimer's disease, unspecified: Secondary | ICD-10-CM | POA: Diagnosis not present

## 2019-01-31 DIAGNOSIS — G309 Alzheimer's disease, unspecified: Secondary | ICD-10-CM | POA: Diagnosis not present

## 2019-02-01 DIAGNOSIS — G309 Alzheimer's disease, unspecified: Secondary | ICD-10-CM | POA: Diagnosis not present

## 2019-02-02 DIAGNOSIS — G309 Alzheimer's disease, unspecified: Secondary | ICD-10-CM | POA: Diagnosis not present

## 2019-02-03 DIAGNOSIS — G309 Alzheimer's disease, unspecified: Secondary | ICD-10-CM | POA: Diagnosis not present

## 2019-02-04 DIAGNOSIS — G309 Alzheimer's disease, unspecified: Secondary | ICD-10-CM | POA: Diagnosis not present

## 2019-02-05 DIAGNOSIS — G309 Alzheimer's disease, unspecified: Secondary | ICD-10-CM | POA: Diagnosis not present

## 2019-02-06 DIAGNOSIS — G309 Alzheimer's disease, unspecified: Secondary | ICD-10-CM | POA: Diagnosis not present

## 2019-02-07 DIAGNOSIS — G309 Alzheimer's disease, unspecified: Secondary | ICD-10-CM | POA: Diagnosis not present

## 2019-02-07 DIAGNOSIS — F411 Generalized anxiety disorder: Secondary | ICD-10-CM | POA: Diagnosis not present

## 2019-02-07 DIAGNOSIS — I10 Essential (primary) hypertension: Secondary | ICD-10-CM | POA: Diagnosis not present

## 2019-02-07 DIAGNOSIS — H1013 Acute atopic conjunctivitis, bilateral: Secondary | ICD-10-CM | POA: Diagnosis not present

## 2019-02-07 DIAGNOSIS — F0390 Unspecified dementia without behavioral disturbance: Secondary | ICD-10-CM | POA: Diagnosis not present

## 2019-02-08 DIAGNOSIS — G309 Alzheimer's disease, unspecified: Secondary | ICD-10-CM | POA: Diagnosis not present

## 2019-02-09 DIAGNOSIS — F418 Other specified anxiety disorders: Secondary | ICD-10-CM | POA: Diagnosis not present

## 2019-02-09 DIAGNOSIS — F4322 Adjustment disorder with anxiety: Secondary | ICD-10-CM | POA: Diagnosis not present

## 2019-02-09 DIAGNOSIS — G309 Alzheimer's disease, unspecified: Secondary | ICD-10-CM | POA: Diagnosis not present

## 2019-02-09 DIAGNOSIS — G3184 Mild cognitive impairment, so stated: Secondary | ICD-10-CM | POA: Diagnosis not present

## 2019-02-09 DIAGNOSIS — F321 Major depressive disorder, single episode, moderate: Secondary | ICD-10-CM | POA: Diagnosis not present

## 2019-02-10 DIAGNOSIS — G309 Alzheimer's disease, unspecified: Secondary | ICD-10-CM | POA: Diagnosis not present

## 2019-02-11 DIAGNOSIS — G309 Alzheimer's disease, unspecified: Secondary | ICD-10-CM | POA: Diagnosis not present

## 2019-02-12 DIAGNOSIS — G309 Alzheimer's disease, unspecified: Secondary | ICD-10-CM | POA: Diagnosis not present

## 2019-02-13 DIAGNOSIS — G309 Alzheimer's disease, unspecified: Secondary | ICD-10-CM | POA: Diagnosis not present

## 2019-02-14 DIAGNOSIS — E039 Hypothyroidism, unspecified: Secondary | ICD-10-CM | POA: Diagnosis not present

## 2019-02-14 DIAGNOSIS — G309 Alzheimer's disease, unspecified: Secondary | ICD-10-CM | POA: Diagnosis not present

## 2019-02-14 DIAGNOSIS — H1013 Acute atopic conjunctivitis, bilateral: Secondary | ICD-10-CM | POA: Diagnosis not present

## 2019-02-14 DIAGNOSIS — I10 Essential (primary) hypertension: Secondary | ICD-10-CM | POA: Diagnosis not present

## 2019-02-15 ENCOUNTER — Ambulatory Visit: Payer: Medicare HMO | Admitting: Podiatry

## 2019-02-15 DIAGNOSIS — G309 Alzheimer's disease, unspecified: Secondary | ICD-10-CM | POA: Diagnosis not present

## 2019-02-16 DIAGNOSIS — G309 Alzheimer's disease, unspecified: Secondary | ICD-10-CM | POA: Diagnosis not present

## 2019-02-17 DIAGNOSIS — G309 Alzheimer's disease, unspecified: Secondary | ICD-10-CM | POA: Diagnosis not present

## 2019-02-18 DIAGNOSIS — G309 Alzheimer's disease, unspecified: Secondary | ICD-10-CM | POA: Diagnosis not present

## 2019-02-21 DIAGNOSIS — I4811 Longstanding persistent atrial fibrillation: Secondary | ICD-10-CM | POA: Diagnosis not present

## 2019-02-21 DIAGNOSIS — G309 Alzheimer's disease, unspecified: Secondary | ICD-10-CM | POA: Diagnosis not present

## 2019-02-21 DIAGNOSIS — I10 Essential (primary) hypertension: Secondary | ICD-10-CM | POA: Diagnosis not present

## 2019-02-21 DIAGNOSIS — E039 Hypothyroidism, unspecified: Secondary | ICD-10-CM | POA: Diagnosis not present

## 2019-02-28 ENCOUNTER — Emergency Department: Payer: Medicare HMO

## 2019-02-28 ENCOUNTER — Other Ambulatory Visit: Payer: Self-pay

## 2019-02-28 ENCOUNTER — Inpatient Hospital Stay
Admission: EM | Admit: 2019-02-28 | Discharge: 2019-03-07 | DRG: 690 | Disposition: A | Discharge status: Skilled Nursing Facility | Payer: Medicare HMO | Source: Skilled Nursing Facility | Attending: Internal Medicine | Admitting: Internal Medicine

## 2019-02-28 ENCOUNTER — Encounter: Payer: Self-pay | Admitting: Emergency Medicine

## 2019-02-28 DIAGNOSIS — F411 Generalized anxiety disorder: Secondary | ICD-10-CM | POA: Diagnosis present

## 2019-02-28 DIAGNOSIS — R41841 Cognitive communication deficit: Secondary | ICD-10-CM | POA: Diagnosis not present

## 2019-02-28 DIAGNOSIS — I495 Sick sinus syndrome: Secondary | ICD-10-CM | POA: Diagnosis present

## 2019-02-28 DIAGNOSIS — F0390 Unspecified dementia without behavioral disturbance: Secondary | ICD-10-CM | POA: Diagnosis not present

## 2019-02-28 DIAGNOSIS — I5022 Chronic systolic (congestive) heart failure: Secondary | ICD-10-CM | POA: Diagnosis not present

## 2019-02-28 DIAGNOSIS — S0083XA Contusion of other part of head, initial encounter: Secondary | ICD-10-CM | POA: Diagnosis not present

## 2019-02-28 DIAGNOSIS — M2550 Pain in unspecified joint: Secondary | ICD-10-CM | POA: Diagnosis not present

## 2019-02-28 DIAGNOSIS — E785 Hyperlipidemia, unspecified: Secondary | ICD-10-CM | POA: Diagnosis present

## 2019-02-28 DIAGNOSIS — M6281 Muscle weakness (generalized): Secondary | ICD-10-CM | POA: Diagnosis not present

## 2019-02-28 DIAGNOSIS — Z955 Presence of coronary angioplasty implant and graft: Secondary | ICD-10-CM

## 2019-02-28 DIAGNOSIS — W19XXXA Unspecified fall, initial encounter: Secondary | ICD-10-CM

## 2019-02-28 DIAGNOSIS — R1310 Dysphagia, unspecified: Secondary | ICD-10-CM | POA: Diagnosis not present

## 2019-02-28 DIAGNOSIS — E039 Hypothyroidism, unspecified: Secondary | ICD-10-CM | POA: Diagnosis present

## 2019-02-28 DIAGNOSIS — J9610 Chronic respiratory failure, unspecified whether with hypoxia or hypercapnia: Secondary | ICD-10-CM | POA: Diagnosis present

## 2019-02-28 DIAGNOSIS — R918 Other nonspecific abnormal finding of lung field: Secondary | ICD-10-CM | POA: Diagnosis not present

## 2019-02-28 DIAGNOSIS — I4891 Unspecified atrial fibrillation: Secondary | ICD-10-CM | POA: Diagnosis not present

## 2019-02-28 DIAGNOSIS — Z7401 Bed confinement status: Secondary | ICD-10-CM | POA: Diagnosis not present

## 2019-02-28 DIAGNOSIS — S0990XD Unspecified injury of head, subsequent encounter: Secondary | ICD-10-CM | POA: Diagnosis not present

## 2019-02-28 DIAGNOSIS — Z882 Allergy status to sulfonamides status: Secondary | ICD-10-CM

## 2019-02-28 DIAGNOSIS — M25461 Effusion, right knee: Secondary | ICD-10-CM | POA: Diagnosis present

## 2019-02-28 DIAGNOSIS — Z950 Presence of cardiac pacemaker: Secondary | ICD-10-CM | POA: Diagnosis not present

## 2019-02-28 DIAGNOSIS — R609 Edema, unspecified: Secondary | ICD-10-CM

## 2019-02-28 DIAGNOSIS — Z7951 Long term (current) use of inhaled steroids: Secondary | ICD-10-CM

## 2019-02-28 DIAGNOSIS — Z7901 Long term (current) use of anticoagulants: Secondary | ICD-10-CM

## 2019-02-28 DIAGNOSIS — M25561 Pain in right knee: Secondary | ICD-10-CM | POA: Diagnosis not present

## 2019-02-28 DIAGNOSIS — E119 Type 2 diabetes mellitus without complications: Secondary | ICD-10-CM | POA: Diagnosis not present

## 2019-02-28 DIAGNOSIS — Z883 Allergy status to other anti-infective agents status: Secondary | ICD-10-CM

## 2019-02-28 DIAGNOSIS — Y92238 Other place in hospital as the place of occurrence of the external cause: Secondary | ICD-10-CM

## 2019-02-28 DIAGNOSIS — K219 Gastro-esophageal reflux disease without esophagitis: Secondary | ICD-10-CM | POA: Diagnosis present

## 2019-02-28 DIAGNOSIS — S0990XA Unspecified injury of head, initial encounter: Secondary | ICD-10-CM

## 2019-02-28 DIAGNOSIS — Z66 Do not resuscitate: Secondary | ICD-10-CM | POA: Diagnosis not present

## 2019-02-28 DIAGNOSIS — Z8249 Family history of ischemic heart disease and other diseases of the circulatory system: Secondary | ICD-10-CM

## 2019-02-28 DIAGNOSIS — I340 Nonrheumatic mitral (valve) insufficiency: Secondary | ICD-10-CM | POA: Diagnosis present

## 2019-02-28 DIAGNOSIS — R0602 Shortness of breath: Secondary | ICD-10-CM

## 2019-02-28 DIAGNOSIS — R1312 Dysphagia, oropharyngeal phase: Secondary | ICD-10-CM | POA: Diagnosis not present

## 2019-02-28 DIAGNOSIS — I110 Hypertensive heart disease with heart failure: Secondary | ICD-10-CM | POA: Diagnosis present

## 2019-02-28 DIAGNOSIS — I482 Chronic atrial fibrillation, unspecified: Secondary | ICD-10-CM | POA: Diagnosis present

## 2019-02-28 DIAGNOSIS — B9620 Unspecified Escherichia coli [E. coli] as the cause of diseases classified elsewhere: Secondary | ICD-10-CM | POA: Diagnosis present

## 2019-02-28 DIAGNOSIS — S0003XA Contusion of scalp, initial encounter: Secondary | ICD-10-CM | POA: Diagnosis present

## 2019-02-28 DIAGNOSIS — Z8612 Personal history of poliomyelitis: Secondary | ICD-10-CM

## 2019-02-28 DIAGNOSIS — J449 Chronic obstructive pulmonary disease, unspecified: Secondary | ICD-10-CM | POA: Diagnosis present

## 2019-02-28 DIAGNOSIS — Z880 Allergy status to penicillin: Secondary | ICD-10-CM

## 2019-02-28 DIAGNOSIS — Z91041 Radiographic dye allergy status: Secondary | ICD-10-CM

## 2019-02-28 DIAGNOSIS — Z8673 Personal history of transient ischemic attack (TIA), and cerebral infarction without residual deficits: Secondary | ICD-10-CM | POA: Diagnosis not present

## 2019-02-28 DIAGNOSIS — F0151 Vascular dementia with behavioral disturbance: Secondary | ICD-10-CM | POA: Diagnosis not present

## 2019-02-28 DIAGNOSIS — R52 Pain, unspecified: Secondary | ICD-10-CM | POA: Diagnosis not present

## 2019-02-28 DIAGNOSIS — T148XXA Other injury of unspecified body region, initial encounter: Secondary | ICD-10-CM

## 2019-02-28 DIAGNOSIS — Z79899 Other long term (current) drug therapy: Secondary | ICD-10-CM

## 2019-02-28 DIAGNOSIS — I428 Other cardiomyopathies: Secondary | ICD-10-CM | POA: Diagnosis not present

## 2019-02-28 DIAGNOSIS — N905 Atrophy of vulva: Secondary | ICD-10-CM | POA: Diagnosis not present

## 2019-02-28 DIAGNOSIS — I10 Essential (primary) hypertension: Secondary | ICD-10-CM | POA: Diagnosis not present

## 2019-02-28 DIAGNOSIS — A419 Sepsis, unspecified organism: Secondary | ICD-10-CM | POA: Diagnosis not present

## 2019-02-28 DIAGNOSIS — I2510 Atherosclerotic heart disease of native coronary artery without angina pectoris: Secondary | ICD-10-CM | POA: Diagnosis not present

## 2019-02-28 DIAGNOSIS — S0512XA Contusion of eyeball and orbital tissues, left eye, initial encounter: Secondary | ICD-10-CM | POA: Diagnosis not present

## 2019-02-28 DIAGNOSIS — N390 Urinary tract infection, site not specified: Principal | ICD-10-CM

## 2019-02-28 DIAGNOSIS — S0083XD Contusion of other part of head, subsequent encounter: Secondary | ICD-10-CM | POA: Diagnosis not present

## 2019-02-28 DIAGNOSIS — Z79890 Hormone replacement therapy (postmenopausal): Secondary | ICD-10-CM

## 2019-02-28 DIAGNOSIS — R404 Transient alteration of awareness: Secondary | ICD-10-CM | POA: Diagnosis not present

## 2019-02-28 DIAGNOSIS — S4992XA Unspecified injury of left shoulder and upper arm, initial encounter: Secondary | ICD-10-CM | POA: Diagnosis not present

## 2019-02-28 NOTE — ED Triage Notes (Signed)
Pt presents from [facility] with c/o unwitnessed fall. Pt foes not remember fall. Hematoma noted to left eye at this time. Pt alert at this time. Pt takes eliquis. Pt has hx of dementia. DNR on pt chart.

## 2019-02-28 NOTE — ED Provider Notes (Signed)
[facility] Emergency Department Provider Note   ____________________________________________   First MD Initiated Contact with Patient 02/28/19 2321     (approximate)  I have reviewed the triage vital signs and the nursing notes.   HISTORY  Chief Complaint Fall  Level V caveat: Limited by dementia  HPI [name] is a 85 y.o. female brought to the ED from [place] [facility] status post unwitnessed fall with facial hematoma.  Patient has a history of dementia, takes Eliquis for atrial fibrillation.  Does not remember fall.  As far as she can remember, she denies recent fever, chills, cough, congestion, chest pain, shortness of breath, abdominal pain, nausea or vomiting.  Denies recent travel or exposure with persons with known coronavirus.     Past Medical History:  Diagnosis Date   A-fib [facility])    Anxiety    Arthritis    Bronchitis 02/2016   Coronary artery disease 12/2007   Diabetes mellitus    x 10 yrs at least   History of stress test 08/2011   negative for ischemia it was nongated and nondiagnostic and compatible with a possible scar or peri-infraction ischemia.    Hx of echocardiogram 09/2012   showed Ef 35-40% with no significant valve disease except for mild to moderate MR. normal RVSP and left atrium was severly dilated.   Hypertension    Pacemaker    Polio    Stroke [facility])    Thyroid disease    hypothyroidism    Patient Active Problem List   Diagnosis Date Noted   Head trauma 03/01/2019   At high risk for falls 04/14/2018   Asthma 04/19/2016   Chronic respiratory failure (HCC) 04/19/2016   COPD (chronic obstructive pulmonary disease) (HCC) 03/08/2016   Hypoxemia 03/08/2016   Acute bronchitis 03/02/2016   Bronchitis 03/01/2016   Long term (current) use of anticoagulants [Z79.01] 01/27/2016   Prolapse of vaginal vault after hysterectomy 01/09/2016   Atypical chest pain 06/03/2015   Anxiety state 03/28/2015    Chest pain 02/04/2015   Hypertensive heart disease 05/12/2014   Unspecified constipation 03/05/2014   Abdominal pain, left lower quadrant 02/14/2014   Abdominal pain, chronic, right lower quadrant 02/14/2014   Change in stool 02/14/2014   Encounter for therapeutic drug monitoring 01/07/2014   Ataxia 05/11/2013   Unstable angina (HCC) 05/08/2013   SSS (sick sinus syndrome)- MDT PTVDP 9/11 05/08/2013   Cardiomyopathy, suspected to be secondary to AF, not CAD 50-55% at cath June 2014 05/08/2013   Situational stress 05/08/2013   Long term current use of anticoagulant therapy 02/19/2013   History of colonic diverticulitis 01/26/2013   CAD, non DES to CFX '09, LAD BMS 7/11. Cath OK June 2014 01/23/2013   Chronic atrial fibrillation 01/23/2013   GERD (gastroesophageal reflux disease) 01/23/2013   Hypothyroidism 01/23/2013    Past Surgical History:  Procedure Laterality Date   3 cardiac stents     she has had non-DES stent to the mid circumflex in 2009 and LAD stent to the mid-distal LAD  BMS June 18, 2010   ABDOMINAL HYSTERECTOMY  1978   TAH/BSO   APPENDECTOMY     CARDIAC SURGERY     CORONARY ANGIOPLASTY     LEFT HEART CATHETERIZATION WITH CORONARY ANGIOGRAM N/A 05/10/2013   Procedure: LEFT HEART CATHETERIZATION WITH CORONARY ANGIOGRAM;  Surgeon: Lennette Bihari, MD;  Location: [facility] CATH LAB;  Service: Cardiovascular;  Laterality: N/A;   PACEMAKER PLACEMENT  08/11/2010   [employer] Adapta  STRESS PERFUSION TEST  10/22/2009   MILD ISCHEMIA IN THE BASAL ANTEROLATERAL AND MID ANTEROLATERAL REGIONS.EF 52%.   TONSILLECTOMY     TRANSTHORACIC ECHOCARDIOGRAM  01/17/2009   EF=>55%.IMPAIRED LV RELAXATION.LA IS MODERATELY DILATED.RA IS MILDLY DILATED. RV SYSTOLIC PRESSURE IS ELEVATED AT 30-40MMHG.MILD TO MODERATE TR.MILD MR.SINCE PRIOR TTE,BOTH ATRIA APPEAR TO BE MORE DILATED.    Prior to Admission medications   Medication Sig Start Date End Date Taking? Authorizing  Provider  acetaminophen (TYLENOL) 500 MG tablet Take 500 mg by mouth every 4 (four) hours as needed.   Yes [provider]  ALPRAZolam (XANAX) 0.25 MG tablet Take 1 tablet (0.25 mg total) by mouth 2 (two) times daily. Patient taking differently: Take 0.25 mg by mouth daily as needed.  03/04/16  Yes Richarda Overlie, MD  ALPRAZolam Prudy Feeler) 0.5 MG tablet Take 0.5 mg by mouth 2 (two) times daily.    Yes [provider]  alum & mag hydroxide-simeth (MAALOX PLUS) 400-400-40 MG/5ML suspension Take 15 mLs by mouth as needed for indigestion.   Yes [provider]  apixaban (ELIQUIS) 2.5 MG TABS tablet Take 2.5 mg by mouth 2 (two) times daily.   Yes [provider]  budesonide-formoterol (SYMBICORT) 160-4.5 MCG/ACT inhaler Inhale 2 puffs into the lungs 2 (two) times daily. Patient taking differently: Inhale 2 puffs into the lungs 2 (two) times daily. *RINSE MOUTH WITH WATER AFTER USE. DO NOT SWALLOW* 09/28/16  Yes Parrett, Tammy S, NP  cetirizine (ZYRTEC) 5 MG tablet Take 5 mg by mouth at bedtime.   Yes [provider]  Dextromethorphan-guaiFENesin (MUCINEX DM) 30-600 MG TB12 Take 1 tablet by mouth every 12 (twelve) hours as needed.  02/16/18  Yes [provider]  digoxin (LANOXIN) 0.125 MG tablet Take 0.125 mg by mouth daily.  04/11/17  Yes [provider]  divalproex (DEPAKOTE SPRINKLE) 125 MG capsule 250 mg 2 (two) times daily.  04/12/18  Yes [provider]  donepezil (ARICEPT) 5 MG tablet Take 5 mg by mouth every evening.  04/11/17  Yes [provider]  guaiFENesin (ROBITUSSIN) 100 MG/5ML liquid Take 200 mg by mouth every 6 (six) hours as needed for cough.   Yes [provider]  Ipratropium-Albuterol (COMBIVENT RESPIMAT) 20-100 MCG/ACT AERS respimat Inhale 1 puff into the lungs 3 (three) times daily. *Rinse mouth after each use* 02/16/18  Yes [provider]  isosorbide mononitrate (IMDUR) 30 MG 24 hr tablet Take 30  mg by mouth daily.  01/19/17  Yes [provider]  levothyroxine (SYNTHROID, LEVOTHROID) 100 MCG tablet Take 100 mcg by mouth daily.  04/08/15  Yes [provider]  loperamide (IMODIUM) 2 MG capsule Take 2 mg by mouth as needed. *Standing order* Take 1 capsule after a loose stool as needed and repeat with 1 capsule after each loose stool. MAX 8 CAPS PER DAY 05/11/17  Yes [provider]  magnesium hydroxide (MILK OF MAGNESIA) 400 MG/5ML suspension Take 30 mLs by mouth at bedtime as needed for mild constipation.   Yes [provider]  Melatonin 3 MG TABS Take 1 tablet by mouth at bedtime.   Yes [provider]  memantine (NAMENDA) 10 MG tablet Take 10 mg by mouth 2 (two) times daily.   Yes [provider]  metoprolol tartrate (LOPRESSOR) 25 MG tablet Take 25 mg by mouth 2 (two) times daily.    Yes [provider]  mirtazapine (REMERON) 15 MG tablet Take 15 mg by mouth at bedtime.  Yes [provider]  neomycin-bacitracin-polymyxin (NEOSPORIN) [id number] ointment Apply 1 application topically as needed. For minor skin tears.   Yes [provider]  OLANZapine (ZYPREXA) 2.5 MG tablet Take 2.5 mg by mouth at bedtime.   Yes [provider]  pravastatin (PRAVACHOL) 20 MG tablet Take 20 mg by mouth at bedtime.  04/11/17  Yes [provider]  baclofen (LIORESAL) 10 MG tablet Take by mouth.    [provider]  diphenoxylate-atropine (LOMOTIL) 2.5-0.025 MG tablet 1 tablets 05/16/17   [provider]  nitrofurantoin, macrocrystal-monohydrate, (MACROBID) 100 MG capsule Take 1 capsule (100 mg total) by mouth 2 (two) times daily for 7 days. 03/01/19 03/08/19  Irean Hong, MD  NONFORMULARY OR COMPOUNDED ITEM Estradiol 0.02 % 1ml prefilled applicator Sig: apply twice a week Patient not taking: Reported on 03/01/2019 03/15/16   Ok Edwards, MD  Psyllium [facility]) [facility] as directed    [provider]  UNABLE TO FIND Take by mouth.    [provider]  Vitamins A & D (VITAMIN A & D) ointment Apply topically.    [provider]    Allergies Iohexol; Penicillins; Sulfa antibiotics; Sulfasalazine; and Iodine  Family History  Problem Relation Age of Onset   Heart disease Father    Cancer Mother     Social History Social History   Tobacco Use   Smoking status: Never Smoker   Smokeless tobacco: Never Used  Substance Use Topics   Alcohol use: No    Alcohol/week: 0.0 standard drinks   Drug use: No    Review of Systems  Constitutional: No fever/chills Eyes: No visual changes. ENT: Positive for facial contusion.  No sore throat. Cardiovascular: Denies chest pain. Respiratory: Denies shortness of breath. Gastrointestinal: No abdominal pain.  No nausea, no vomiting.  No diarrhea.  No constipation. Genitourinary: Negative for dysuria. Musculoskeletal: Holding her left shoulder.  Negative for back pain. Skin: Negative for rash. Neurological: Negative for headaches, focal weakness or numbness.   ____________________________________________   PHYSICAL EXAM:  VITAL SIGNS: ED Triage Vitals  Enc Vitals Group     BP 02/28/19 2303 (!) 149/63     Pulse Rate 02/28/19 2303 76     Resp 02/28/19 2303 16     Temp 02/28/19 2304 97.9 F (36.6 C)     Temp Source 02/28/19 2304 Oral     SpO2 02/28/19 2303 97 %     Weight 02/28/19 2304 140 lb (63.5 kg)     Height 02/28/19 2304  (1.753 m)     Head Circumference --      Peak Flow --      Pain Score 02/28/19 2304 0     Pain Loc --      Pain Edu? --      Excl. in GC? --     Constitutional: Alert and oriented.  Elderly appearing and in mild acute distress. Eyes: Conjunctivae are normal. PERRL. EOMI. Head: Ecchymosis and hematoma to lateral left forehead and eye. Nose: Atraumatic. Mouth/Throat: Mucous membranes are moist.  No dental malocclusion. Neck: No stridor.  No cervical spine  tenderness to palpation.  No step-offs or deformities. Cardiovascular: Normal rate, regular rhythm. Grossly normal heart sounds.  Good peripheral circulation. Respiratory: Normal respiratory effort.  No retractions. Lungs CTAB. Gastrointestinal: Soft and nontender. No distention. No abdominal bruits. No CVA tenderness. Musculoskeletal: Holding left shoulder.  Left shoulder without deformity, full range of motion without pain.  2+ radial pulse.  Brisk,  less than 5-second capillary refill.  This is stable.  No spinal tenderness to palpation.  No lower extremity tenderness nor edema.  No joint effusions. Neurologic:  Normal speech and language. No gross focal neurologic deficits are appreciated. MAEx4. Skin:  Skin is warm, dry and intact. No rash noted. Psychiatric: Mood and affect are normal. Speech and behavior are normal.  ____________________________________________   LABS (all labs ordered are listed, but only abnormal results are displayed)  Labs Reviewed  URINALYSIS, COMPLETE (UACMP) WITH MICROSCOPIC - Abnormal; Notable for the following components:      Result Value   Color, Urine YELLOW (*)    APPearance CLOUDY (*)    Hgb urine dipstick SMALL (*)    Nitrite POSITIVE (*)    Leukocytes,Ua LARGE (*)    WBC, UA >50 (*)    Bacteria, UA MANY (*)    All other components within normal limits  CBC WITH DIFFERENTIAL/PLATELET - Abnormal; Notable for the following components:   WBC 12.5 (*)    Neutro Abs 9.0 (*)    Monocytes Absolute 1.6 (*)    Abs Immature Granulocytes 0.10 (*)    All other components within normal limits  BASIC METABOLIC PANEL - Abnormal; Notable for the following components:   Glucose, Bld 116 (*)    All other components within normal limits  LACTIC ACID, PLASMA - Abnormal; Notable for the following components:   Lactic Acid, Venous 3.2 (*)    All other components within normal limits  TSH - Abnormal; Notable for the following components:   TSH 17.049 (*)    All  other components within normal limits  CULTURE, BLOOD (ROUTINE X 2)  CULTURE, BLOOD (ROUTINE X 2)  URINE CULTURE  MRSA PCR SCREENING  TROPONIN I  LACTIC ACID, PLASMA  LACTIC ACID, PLASMA   ____________________________________________  EKG  ED ECG REPORT I, Sheccid Lahmann J, the attending physician, personally viewed and interpreted this ECG.   Date: 03/01/2019  EKG Time: 2308  Rate: 76  Rhythm: atrial fibrillation, rate 76  Axis: LAD  Intervals:left posterior fascicular block  ST&T Change: Nonspecific  ____________________________________________  RADIOLOGY  ED MD interpretation: No ICH, hematoma without facial fracture, negative shoulder: Chest x-ray demonstrates mild CHF  Official radiology report(s): Ct Head Wo Contrast  Result Date: 03/01/2019 CLINICAL DATA:  Fall in room, increased frontal swelling EXAM: CT HEAD WITHOUT CONTRAST TECHNIQUE: Contiguous axial images were obtained from the base of the skull through the vertex without intravenous contrast. COMPARISON:  02/28/2019 FINDINGS: Brain: No evidence of acute infarction, hemorrhage, hydrocephalus, extra-axial collection or mass lesion/mass effect. Cortical and central atrophy. Subcortical white matter and periventricular small vessel ischemic changes. Vascular: Intracranial atherosclerosis. Skull: Normal. Negative for fracture or focal lesion. Sinuses/Orbits: The visualized paranasal sinuses are essentially clear. The mastoid air cells are unopacified. Other: Large extracranial hematoma overlying the left paramidline frontal bone (series 3/image 10), new. Additional small to moderate extracranial hematoma overlying the left frontal bone (series 3/image 17), unchanged. Associated soft tissue swelling, including overlying the left orbit and lateral zygoma, unchanged. IMPRESSION: New large extracranial hematoma overlying the left frontal bone. No evidence of calvarial fracture. Otherwise unchanged, as above. No evidence of acute  intracranial abnormality. Electronically Signed   By: Charline Bills M.D.   On: 03/01/2019 02:17   Ct Head Wo Contrast  Result Date: 03/01/2019 CLINICAL DATA:  Unwitnessed fall, left eye hematoma, on Eliquis, dementia EXAM: CT HEAD WITHOUT CONTRAST CT MAXILLOFACIAL WITHOUT CONTRAST TECHNIQUE: Multidetector CT imaging of the head  and maxillofacial structures were performed using the standard protocol without intravenous contrast. Multiplanar CT image reconstructions of the maxillofacial structures were also generated. COMPARISON:  CT head dated 01/05/2018 FINDINGS: Motion degraded images. CT HEAD FINDINGS Brain: No evidence of acute infarction, hemorrhage, hydrocephalus, extra-axial collection or mass lesion/mass effect. Global cortical atrophy. Subcortical white matter and periventricular small vessel ischemic changes. Vascular: Intracranial atherosclerosis. Skull: Normal. Negative for fracture or focal lesion. Other: 1.1 x 2.5 cm extracranial hematoma overlying the left frontal bone (series 2/image 16). Additional soft tissue swelling. CT MAXILLOFACIAL FINDINGS Osseous: No evidence of maxillofacial fracture. Mandible is intact. Bilateral mandibular condyles are well-seated in the TMJs. Orbits: The bilateral orbits, including the globes and retroconal soft tissues, are within normal limits. Sinuses: The visualized paranasal sinuses are essentially clear. The mastoid air cells are unopacified. Soft tissues: Soft tissue swelling overlying the left lateral orbit, zygoma, and lateral maxilla. IMPRESSION: Soft tissue swelling/extracranial hematoma overlying the left frontal bone. No evidence of calvarial fracture. No evidence of acute intracranial abnormality. Atrophy with small vessel ischemic changes. Soft tissue swelling overlying the left lateral orbit, zygoma, and lateral maxilla. Underlying globe and retroconal soft tissues are within normal limits. No evidence of maxillofacial fracture. Electronically Signed    By: Charline Bills M.D.   On: 03/01/2019 00:00   Dg Chest Port 1 View  Result Date: 03/01/2019 CLINICAL DATA:  Elevated lactate EXAM: PORTABLE CHEST 1 VIEW COMPARISON:  07/20/2017 FINDINGS: Cardiomegaly. Single chamber pacer to the right ventricle. Coronary stenting. Symmetric interstitial opacity with Kerley lines. Possible trace pleural fluid. No pneumothorax. IMPRESSION: Interstitial opacity with Kerley lines and cardiomegaly-likely CHF. Electronically Signed   By: Marnee Spring M.D.   On: 03/01/2019 05:49   Dg Shoulder Left  Result Date: 03/01/2019 CLINICAL DATA:  Unwitnessed fall. EXAM: LEFT SHOULDER - 2+ VIEW COMPARISON:  None. FINDINGS: The humeral head is well-formed and located. The subacromial, glenohumeral and acromioclavicular joint spaces are intact. Severe acromioclavicular joint space narrowing and undersurface spurring consistent with degenerative change. No destructive bony lesions. Soft tissue planes are non-suspicious. Cardiac pacemaker battery prep at projects LEFT chest. IMPRESSION: 1. No fracture deformity or dislocation. Electronically Signed   By: Awilda Metro M.D.   On: 03/01/2019 00:06   Ct Maxillofacial Wo Cm  Result Date: 03/01/2019 CLINICAL DATA:  Unwitnessed fall, left eye hematoma, on Eliquis, dementia EXAM: CT HEAD WITHOUT CONTRAST CT MAXILLOFACIAL WITHOUT CONTRAST TECHNIQUE: Multidetector CT imaging of the head and maxillofacial structures were performed using the standard protocol without intravenous contrast. Multiplanar CT image reconstructions of the maxillofacial structures were also generated. COMPARISON:  CT head dated 01/05/2018 FINDINGS: Motion degraded images. CT HEAD FINDINGS Brain: No evidence of acute infarction, hemorrhage, hydrocephalus, extra-axial collection or mass lesion/mass effect. Global cortical atrophy. Subcortical white matter and periventricular small vessel ischemic changes. Vascular: Intracranial atherosclerosis. Skull: Normal.  Negative for fracture or focal lesion. Other: 1.1 x 2.5 cm extracranial hematoma overlying the left frontal bone (series 2/image 16). Additional soft tissue swelling. CT MAXILLOFACIAL FINDINGS Osseous: No evidence of maxillofacial fracture. Mandible is intact. Bilateral mandibular condyles are well-seated in the TMJs. Orbits: The bilateral orbits, including the globes and retroconal soft tissues, are within normal limits. Sinuses: The visualized paranasal sinuses are essentially clear. The mastoid air cells are unopacified. Soft tissues: Soft tissue swelling overlying the left lateral orbit, zygoma, and lateral maxilla. IMPRESSION: Soft tissue swelling/extracranial hematoma overlying the left frontal bone. No evidence of calvarial fracture. No evidence of acute intracranial abnormality. Atrophy with  small vessel ischemic changes. Soft tissue swelling overlying the left lateral orbit, zygoma, and lateral maxilla. Underlying globe and retroconal soft tissues are within normal limits. No evidence of maxillofacial fracture. Electronically Signed   By: Charline Bills M.D.   On: 03/01/2019 00:00    ____________________________________________   PROCEDURES  Procedure(s) performed (including Critical Care):  Procedures  CRITICAL CARE Performed by: Irean Hong   Total critical care time: 60 minutes  Critical care time was exclusive of separately billable procedures and treating other patients.  Critical care was necessary to treat or prevent imminent or life-threatening deterioration.  Critical care was time spent personally by me on the following activities: development of treatment plan with patient and/or surrogate as well as nursing, discussions with consultants, evaluation of patient's response to treatment, examination of patient, obtaining history from patient or surrogate, ordering and performing treatments and interventions, ordering and review of laboratory studies, ordering and review of  radiographic studies, pulse oximetry and re-evaluation of patient's condition. ____________________________________________   INITIAL IMPRESSION / ASSESSMENT AND PLAN / ED COURSE  As part of my medical decision making, I reviewed the following data within the electronic MEDICAL RECORD NUMBER Nursing notes reviewed and incorporated, Old chart reviewed, Radiograph reviewed and Notes from prior ED visits        85 year old female on Eliquis who presents status post unwitnessed fall with facial hematoma and holding her left shoulder.  Differential diagnosis includes but is not limited to ICH, SDH, SAH, facial fracture, shoulder fracture/dislocation, musculoskeletal contusion.  Will obtain CT head and maxillofacial, x-ray left shoulder.  Will obtain urinalysis to evaluate for UTI.   Clinical Course as of Feb 28 713  Thu Mar 01, 2019  0017 Updated patient on imaging results.  Ice pack applied to hematoma.  Will obtain urine specimen.   [JS]  0133 Updated patient on urine results.  Will antibiotic and she will follow-up closely with her PCP.  Strict return precautions given.  Patient verbalizes understanding agrees with plan of care.   [JS]  [id number] Patient found on the floor of the room.  Appears she had her cell to the end of the stretcher, fall onto her knees and her forehead.  Now there is a new abrasion to the right forehead.  Left nostril with slight oozing.  Will repeat CT head.  Sitter ordered.   [JS]  0223 Repeat CT head negative for intracranial hemorrhage.  Patient did not keep ice pack on her head.  Will apply ice pack with Coban dressing and monitor before discharge back to the nursing facility.   [JS]  [id number] Patient finally settled down and is sleeping soundly.  Ecchymosis is darkening over hematoma.  Given her age and comorbidities, will obtain basic lab work including lactic acid.   [JS]  0404 Inquired regarding if [facility] has sitter available to be with patient.  Does not look  like this is available.  Discussed with hospitalist Dr. Sheryle Hail to evaluate patient in the emergency department for neuro observation and treatment of UTI.   [JS]  0447 Lactic acid is 3.2.  Code sepsis initiated.  IV fluids and antibiotics ordered.   [JS]    Clinical Course User Index [JS] Irean Hong, MD     ____________________________________________   FINAL CLINICAL IMPRESSION(S) / ED DIAGNOSES  Final diagnoses:  Fall, initial encounter  Injury of head, initial encounter  Contusion of face, initial encounter  Lower urinary tract infectious disease  Hematoma  Sepsis, due to unspecified  organism, unspecified whether acute organ dysfunction present [facility])     ED Discharge Orders         Ordered    nitrofurantoin, macrocrystal-monohydrate, (MACROBID) 100 MG capsule  2 times daily     03/01/19 0135           Note:  This document was prepared using Dragon voice recognition software and may include unintentional dictation errors.   Irean Hong, MD 03/01/19 [phone]

## 2019-03-01 ENCOUNTER — Emergency Department: Payer: Medicare HMO

## 2019-03-01 ENCOUNTER — Observation Stay: Payer: Medicare HMO

## 2019-03-01 DIAGNOSIS — I4891 Unspecified atrial fibrillation: Secondary | ICD-10-CM | POA: Diagnosis not present

## 2019-03-01 DIAGNOSIS — S0990XA Unspecified injury of head, initial encounter: Secondary | ICD-10-CM | POA: Diagnosis not present

## 2019-03-01 DIAGNOSIS — R918 Other nonspecific abnormal finding of lung field: Secondary | ICD-10-CM | POA: Diagnosis not present

## 2019-03-01 DIAGNOSIS — E119 Type 2 diabetes mellitus without complications: Secondary | ICD-10-CM | POA: Diagnosis not present

## 2019-03-01 DIAGNOSIS — I2510 Atherosclerotic heart disease of native coronary artery without angina pectoris: Secondary | ICD-10-CM | POA: Diagnosis not present

## 2019-03-01 LAB — CBC WITH DIFFERENTIAL/PLATELET
Abs Immature Granulocytes: 0.1 10*3/uL — ABNORMAL HIGH (ref 0.00–0.07)
Basophils Absolute: 0.1 10*3/uL (ref 0.0–0.1)
Basophils Relative: 1 %
Eosinophils Absolute: 0.2 10*3/uL (ref 0.0–0.5)
Eosinophils Relative: 1 %
HCT: 40 % (ref 36.0–46.0)
Hemoglobin: 12.1 g/dL (ref 12.0–15.0)
Immature Granulocytes: 1 %
Lymphocytes Relative: 13 %
Lymphs Abs: 1.6 10*3/uL (ref 0.7–4.0)
MCH: 28.4 pg (ref 26.0–34.0)
MCHC: 30.3 g/dL (ref 30.0–36.0)
MCV: 93.9 fL (ref 80.0–100.0)
Monocytes Absolute: 1.6 10*3/uL — ABNORMAL HIGH (ref 0.1–1.0)
Monocytes Relative: 13 %
Neutro Abs: 9 10*3/uL — ABNORMAL HIGH (ref 1.7–7.7)
Neutrophils Relative %: 71 %
Platelets: 270 10*3/uL (ref 150–400)
RBC: 4.26 MIL/uL (ref 3.87–5.11)
RDW: 15 % (ref 11.5–15.5)
WBC: 12.5 10*3/uL — ABNORMAL HIGH (ref 4.0–10.5)
nRBC: 0 % (ref 0.0–0.2)

## 2019-03-01 LAB — URINALYSIS, COMPLETE (UACMP) WITH MICROSCOPIC
Bilirubin Urine: NEGATIVE
Glucose, UA: NEGATIVE mg/dL
Ketones, ur: NEGATIVE mg/dL
Nitrite: POSITIVE — AB
Protein, ur: NEGATIVE mg/dL
Specific Gravity, Urine: 1.008 (ref 1.005–1.030)
WBC, UA: >50 WBC/hpf — ABNORMAL HIGH (ref 0–5)
pH: 8 (ref 5.0–8.0)

## 2019-03-01 LAB — BASIC METABOLIC PANEL
Anion gap: 9 (ref 5–15)
BUN: 15 mg/dL (ref 8–23)
CO2: 31 mmol/L (ref 22–32)
Calcium: 8.9 mg/dL (ref 8.9–10.3)
Chloride: 102 mmol/L (ref 98–111)
Creatinine, Ser: 0.55 mg/dL (ref 0.44–1.00)
GFR calc Af Amer: >60 mL/min (ref >60)
GFR calc non Af Amer: >60 mL/min (ref >60)
Glucose, Bld: 116 mg/dL — ABNORMAL HIGH (ref 70–99)
Potassium: 4 mmol/L (ref 3.5–5.1)
Sodium: 142 mmol/L (ref 135–145)

## 2019-03-01 LAB — T4, FREE: Free T4: 0.77 ng/dL — ABNORMAL LOW (ref 0.82–1.77)

## 2019-03-01 LAB — TROPONIN I: Troponin I: <0.03 ng/mL (ref <0.03)

## 2019-03-01 LAB — LACTIC ACID, PLASMA
Lactic Acid, Venous: 3.2 mmol/L (ref 0.5–1.9)
Lactic Acid, Venous: 2.3 mmol/L (ref 0.5–1.9)
Lactic Acid, Venous: 2.9 mmol/L (ref 0.5–1.9)
Lactic Acid, Venous: 1.3 mmol/L (ref 0.5–1.9)

## 2019-03-01 LAB — TSH: TSH: 17.049 u[IU]/mL — ABNORMAL HIGH (ref 0.350–4.500)

## 2019-03-01 LAB — MRSA PCR SCREENING: MRSA by PCR: NEGATIVE

## 2019-03-01 MED ORDER — SODIUM CHLORIDE 0.9 % IV SOLN
1.0000 g | INTRAVENOUS | Status: DC
  Administered 2019-03-02 – 2019-03-05 (×4): 1 g via INTRAVENOUS
  Filled 2019-03-01 (×4): qty 1

## 2019-03-01 MED ORDER — METOPROLOL TARTRATE 25 MG PO TABS
25.0000 mg | ORAL_TABLET | Freq: Two times a day (BID) | ORAL | Status: DC
  Administered 2019-03-01 – 2019-03-07 (×12): 25 mg via ORAL
  Filled 2019-03-01 (×12): qty 1

## 2019-03-01 MED ORDER — SODIUM CHLORIDE 0.9 % IV SOLN
INTRAVENOUS | Status: DC
  Administered 2019-03-01 – 2019-03-02 (×2): via INTRAVENOUS

## 2019-03-01 MED ORDER — SODIUM CHLORIDE 0.9 % IV BOLUS (SEPSIS)
1000.0000 mL | Freq: Once | INTRAVENOUS | Status: AC
  Administered 2019-03-01: 1000 mL via INTRAVENOUS

## 2019-03-01 MED ORDER — OLANZAPINE 2.5 MG PO TABS
2.5000 mg | ORAL_TABLET | Freq: Every day | ORAL | Status: DC
  Administered 2019-03-01 – 2019-03-06 (×6): 2.5 mg via ORAL
  Filled 2019-03-01 (×7): qty 1

## 2019-03-01 MED ORDER — DIGOXIN 125 MCG PO TABS
0.1250 mg | ORAL_TABLET | Freq: Every day | ORAL | Status: DC
  Administered 2019-03-02 – 2019-03-07 (×6): 0.125 mg via ORAL
  Filled 2019-03-01 (×7): qty 1

## 2019-03-01 MED ORDER — OXYCODONE-ACETAMINOPHEN 5-325 MG PO TABS: 1.0000 | ORAL_TABLET | Freq: Four times a day (QID) | ORAL | Status: DC | PRN

## 2019-03-01 MED ORDER — NITROFURANTOIN MONOHYD MACRO 100 MG PO CAPS
100.0000 mg | ORAL_CAPSULE | Freq: Once | ORAL | Status: AC
  Administered 2019-03-01: 03:00:00 100 mg via ORAL
  Filled 2019-03-01: qty 1

## 2019-03-01 MED ORDER — IPRATROPIUM-ALBUTEROL 0.5-2.5 (3) MG/3ML IN SOLN: 3.0000 mL | Freq: Four times a day (QID) | RESPIRATORY_TRACT | Status: DC | PRN

## 2019-03-01 MED ORDER — NITROFURANTOIN MONOHYD MACRO 100 MG PO CAPS: 100.0000 mg | ORAL_CAPSULE | Freq: Two times a day (BID) | ORAL | 0 refills | Status: DC

## 2019-03-01 MED ORDER — ACETAMINOPHEN 650 MG RE SUPP: 650.0000 mg | Freq: Four times a day (QID) | RECTAL | Status: DC | PRN

## 2019-03-01 MED ORDER — HALOPERIDOL LACTATE 5 MG/ML IJ SOLN: 2.0000 mg | Freq: Four times a day (QID) | INTRAMUSCULAR | Status: DC | PRN

## 2019-03-01 MED ORDER — DIVALPROEX SODIUM 125 MG PO CSDR
250.0000 mg | DELAYED_RELEASE_CAPSULE | Freq: Two times a day (BID) | ORAL | Status: DC
  Administered 2019-03-01 – 2019-03-07 (×12): 250 mg via ORAL
  Filled 2019-03-01 (×14): qty 2

## 2019-03-01 MED ORDER — DOCUSATE SODIUM 100 MG PO CAPS
100.0000 mg | ORAL_CAPSULE | Freq: Two times a day (BID) | ORAL | Status: DC
  Administered 2019-03-01 – 2019-03-07 (×12): 100 mg via ORAL
  Filled 2019-03-01 (×13): qty 1

## 2019-03-01 MED ORDER — PRAVASTATIN SODIUM 20 MG PO TABS
20.0000 mg | ORAL_TABLET | Freq: Every day | ORAL | Status: DC
  Administered 2019-03-01 – 2019-03-06 (×6): 20 mg via ORAL
  Filled 2019-03-01 (×6): qty 1

## 2019-03-01 MED ORDER — IPRATROPIUM-ALBUTEROL 0.5-2.5 (3) MG/3ML IN SOLN
3.0000 mL | Freq: Three times a day (TID) | RESPIRATORY_TRACT | Status: DC
  Administered 2019-03-01 – 2019-03-07 (×18): 3 mL via RESPIRATORY_TRACT
  Filled 2019-03-01 (×19): qty 3

## 2019-03-01 MED ORDER — LEVOFLOXACIN IN D5W 750 MG/150ML IV SOLN
750.0000 mg | Freq: Once | INTRAVENOUS | Status: AC
  Administered 2019-03-01: 750 mg via INTRAVENOUS
  Filled 2019-03-01: qty 150

## 2019-03-01 MED ORDER — ACETAMINOPHEN 325 MG PO TABS
650.0000 mg | ORAL_TABLET | Freq: Four times a day (QID) | ORAL | Status: DC | PRN
  Administered 2019-03-02 – 2019-03-06 (×4): 650 mg via ORAL
  Filled 2019-03-01 (×4): qty 2

## 2019-03-01 MED ORDER — ONDANSETRON HCL 4 MG/2ML IJ SOLN: 4.0000 mg | Freq: Four times a day (QID) | INTRAMUSCULAR | Status: DC | PRN

## 2019-03-01 MED ORDER — LORATADINE 10 MG PO TABS
10.0000 mg | ORAL_TABLET | Freq: Every day | ORAL | Status: DC
  Administered 2019-03-02 – 2019-03-07 (×6): 10 mg via ORAL
  Filled 2019-03-01 (×7): qty 1

## 2019-03-01 MED ORDER — IPRATROPIUM-ALBUTEROL 20-100 MCG/ACT IN AERS: 1.0000 | INHALATION_SPRAY | Freq: Three times a day (TID) | RESPIRATORY_TRACT | Status: DC

## 2019-03-01 MED ORDER — MOMETASONE FURO-FORMOTEROL FUM 200-5 MCG/ACT IN AERO
2.0000 | INHALATION_SPRAY | Freq: Two times a day (BID) | RESPIRATORY_TRACT | Status: DC
  Administered 2019-03-01 – 2019-03-07 (×11): 2 via RESPIRATORY_TRACT
  Filled 2019-03-01: qty 8.8

## 2019-03-01 MED ORDER — ORAL CARE MOUTH RINSE
15.0000 mL | Freq: Two times a day (BID) | OROMUCOSAL | Status: DC
  Administered 2019-03-01 – 2019-03-07 (×9): 15 mL via OROMUCOSAL

## 2019-03-01 MED ORDER — ONDANSETRON HCL 4 MG PO TABS: 4.0000 mg | ORAL_TABLET | Freq: Four times a day (QID) | ORAL | Status: DC | PRN

## 2019-03-01 MED ORDER — LEVOTHYROXINE SODIUM 112 MCG PO TABS
112.0000 ug | ORAL_TABLET | Freq: Every day | ORAL | Status: DC
  Administered 2019-03-02 – 2019-03-07 (×6): 112 ug via ORAL
  Filled 2019-03-01 (×8): qty 1

## 2019-03-01 NOTE — H&P (Addendum)
[name] is an 85 y.o. female.   Chief Complaint: Fall HPI: The patient with past medical history of atrial fibrillation, coronary artery disease, diabetes, hypothyroidism and history of stroke presents to the emergency department following a fall.  CT of her head was normal.  However, the patient fell once again once she was here in our emergency department and underwent another CT of her head demonstrating large cranial hematoma without evidence of fracture or intracranial bleed.  The patient appeared confused.  She was found to have a urinary tract infection.  Antibiotics were started and the emergency department staff called the hospitalist service for admission.  Past Medical History:  Diagnosis Date  . A-fib (HCC)   . Anxiety   . Arthritis   . Bronchitis 02/2016  . Coronary artery disease 12/2007  . Diabetes mellitus    x 10 yrs at least  . History of stress test 08/2011   negative for ischemia it was nongated and nondiagnostic and compatible with a possible scar or peri-infraction ischemia.   Marland Kitchen Hx of echocardiogram 09/2012   showed Ef 35-40% with no significant valve disease except for mild to moderate MR. normal RVSP and left atrium was severly dilated.  . Hypertension   . Pacemaker   . Polio   . Stroke (HCC)   . Thyroid disease    hypothyroidism    Past Surgical History:  Procedure Laterality Date  . 3 cardiac stents     she has had non-DES stent to the mid circumflex in 2009 and LAD stent to the mid-distal LAD  BMS June 18, 2010  . ABDOMINAL HYSTERECTOMY  1978   TAH/BSO  . APPENDECTOMY    . CARDIAC SURGERY    . CORONARY ANGIOPLASTY    . LEFT HEART CATHETERIZATION WITH CORONARY ANGIOGRAM N/A 05/10/2013   Procedure: LEFT HEART CATHETERIZATION WITH CORONARY ANGIOGRAM;  Surgeon: Lennette Bihari, MD;  Location: [facility] CATH LAB;  Service: Cardiovascular;  Laterality: N/A;  . PACEMAKER PLACEMENT  08/11/2010   Medtronic Adapta   . STRESS PERFUSION TEST  10/22/2009   MILD  ISCHEMIA IN THE BASAL ANTEROLATERAL AND MID ANTEROLATERAL REGIONS.EF 52%.  . TONSILLECTOMY    . TRANSTHORACIC ECHOCARDIOGRAM  01/17/2009   EF=>55%.IMPAIRED LV RELAXATION.LA IS MODERATELY DILATED.RA IS MILDLY DILATED. RV SYSTOLIC PRESSURE IS ELEVATED AT 30-40MMHG.MILD TO MODERATE TR.MILD MR.SINCE PRIOR TTE,BOTH ATRIA APPEAR TO BE MORE DILATED.    Family History  Problem Relation Age of Onset  . Heart disease Father   . Cancer Mother    Social History:  reports that she has never smoked. She has never used smokeless tobacco. She reports that she does not drink alcohol or use drugs.  Allergies:  Allergies  Allergen Reactions  . Iohexol Other (See Comments)     Desc: CHEST PAIN, LOC AFTER HEART CATH, PT REFUSED DYE  Contrast Dye / Desc: CHEST PAIN, LOC AFTER HEART CATH, PT REFUSED DYE  . Penicillins Other (See Comments)    Has patient had a PCN reaction causing immediate rash, facial/tongue/throat swelling, SOB or lightheadedness with hypotension: No Has patient had a PCN reaction causing severe rash involving mucus membranes or skin necrosis: Unknown Has patient had a PCN reaction that required hospitalization: Unknown Has patient had a PCN reaction occurring within the last 10 years: Unknown If all of the above answers are "NO", then may proceed with Cephalosporin use.  Told not to take medication   . Sulfa Antibiotics Other (See Comments)    Told not  to take med  . Sulfasalazine Other (See Comments)    Told not to take med  . Iodine Other (See Comments)    Medications Prior to Admission  Medication Sig Dispense Refill  . acetaminophen (TYLENOL) 500 MG tablet Take 500 mg by mouth every 4 (four) hours as needed.    . ALPRAZolam (XANAX) 0.25 MG tablet Take 1 tablet (0.25 mg total) by mouth 2 (two) times daily. (Patient taking differently: Take 0.25 mg by mouth daily as needed. ) 60 tablet 0  . ALPRAZolam (XANAX) 0.5 MG tablet Take 0.5 mg by mouth 2 (two) times daily.     Marland Kitchen alum &  mag hydroxide-simeth (MAALOX PLUS) 400-400-40 MG/5ML suspension Take 15 mLs by mouth as needed for indigestion.    Marland Kitchen apixaban (ELIQUIS) 2.5 MG TABS tablet Take 2.5 mg by mouth 2 (two) times daily.    . budesonide-formoterol (SYMBICORT) 160-4.5 MCG/ACT inhaler Inhale 2 puffs into the lungs 2 (two) times daily. (Patient taking differently: Inhale 2 puffs into the lungs 2 (two) times daily. *RINSE MOUTH WITH WATER AFTER USE. DO NOT SWALLOW*) 1 Inhaler 5  . cetirizine (ZYRTEC) 5 MG tablet Take 5 mg by mouth at bedtime.    Marland Kitchen Dextromethorphan-guaiFENesin (MUCINEX DM) 30-600 MG TB12 Take 1 tablet by mouth every 12 (twelve) hours as needed.     . digoxin (LANOXIN) 0.125 MG tablet Take 0.125 mg by mouth daily.     . divalproex (DEPAKOTE SPRINKLE) 125 MG capsule 250 mg 2 (two) times daily.     Marland Kitchen donepezil (ARICEPT) 5 MG tablet Take 5 mg by mouth every evening.     Marland Kitchen guaiFENesin (ROBITUSSIN) 100 MG/5ML liquid Take 200 mg by mouth every 6 (six) hours as needed for cough.    . Ipratropium-Albuterol (COMBIVENT RESPIMAT) 20-100 MCG/ACT AERS respimat Inhale 1 puff into the lungs 3 (three) times daily. *Rinse mouth after each use*    . isosorbide mononitrate (IMDUR) 30 MG 24 hr tablet Take 30 mg by mouth daily.     Marland Kitchen levothyroxine (SYNTHROID, LEVOTHROID) 100 MCG tablet Take 100 mcg by mouth daily.     Marland Kitchen loperamide (IMODIUM) 2 MG capsule Take 2 mg by mouth as needed. *Standing order* Take 1 capsule after a loose stool as needed and repeat with 1 capsule after each loose stool. MAX 8 CAPS PER DAY    . magnesium hydroxide (MILK OF MAGNESIA) 400 MG/5ML suspension Take 30 mLs by mouth at bedtime as needed for mild constipation.    . Melatonin 3 MG TABS Take 1 tablet by mouth at bedtime.    . memantine (NAMENDA) 10 MG tablet Take 10 mg by mouth 2 (two) times daily.    . metoprolol tartrate (LOPRESSOR) 25 MG tablet Take 25 mg by mouth 2 (two) times daily.     . mirtazapine (REMERON) 15 MG tablet Take 15 mg by mouth at  bedtime.     Marland Kitchen neomycin-bacitracin-polymyxin (NEOSPORIN) [phone] ointment Apply 1 application topically as needed. For minor skin tears.    Marland Kitchen OLANZapine (ZYPREXA) 2.5 MG tablet Take 2.5 mg by mouth at bedtime.    . pravastatin (PRAVACHOL) 20 MG tablet Take 20 mg by mouth at bedtime.     . baclofen (LIORESAL) 10 MG tablet Take by mouth.    . diphenoxylate-atropine (LOMOTIL) 2.5-0.025 MG tablet 1 tablets    . NONFORMULARY OR COMPOUNDED ITEM Estradiol 0.02 % 69ml prefilled applicator Sig: apply twice a week (Patient not taking: Reported on 03/01/2019) 24 each 4  .  Psyllium (METAMUCIL) WAFR as directed    . UNABLE TO FIND Take by mouth.    . Vitamins A & D (VITAMIN A & D) ointment Apply topically.      Results for orders placed or performed during the hospital encounter of 02/28/19 (from the past 48 hour(s))  Urinalysis, Complete w Microscopic     Status: Abnormal   Collection Time: 02/28/19  1:00 AM  Result Value Ref Range   Color, Urine YELLOW (A) YELLOW   APPearance CLOUDY (A) CLEAR   Specific Gravity, Urine 1.008 1.005 - 1.030   pH 8.0 5.0 - 8.0   Glucose, UA NEGATIVE NEGATIVE mg/dL   Hgb urine dipstick SMALL (A) NEGATIVE   Bilirubin Urine NEGATIVE NEGATIVE   Ketones, ur NEGATIVE NEGATIVE mg/dL   Protein, ur NEGATIVE NEGATIVE mg/dL   Nitrite POSITIVE (A) NEGATIVE   Leukocytes,Ua LARGE (A) NEGATIVE   RBC / HPF 0-5 0 - 5 RBC/hpf   WBC, UA >50 (H) 0 - 5 WBC/hpf   Bacteria, UA MANY (A) NONE SEEN   Squamous Epithelial / LPF 0-5 0 - 5   Mucus PRESENT     Comment: Performed at [facility], [address] [place], [place] [address]  Lactic acid, plasma     Status: Abnormal   Collection Time: 03/01/19  4:00 AM  Result Value Ref Range   Lactic Acid, Venous 3.2 (HH) 0.5 - 1.9 mmol/L    Comment: CRITICAL RESULT CALLED TO, READ BACK BY AND VERIFIED WITH ANNIE SMITH AT [phone] 03/01/2019 SMA Performed at [facility], [address] [place], [place] [address]   CBC  with Differential     Status: Abnormal   Collection Time: 03/01/19  4:03 AM  Result Value Ref Range   WBC 12.5 (H) 4.0 - 10.5 K/uL   RBC 4.26 3.87 - 5.11 MIL/uL   Hemoglobin 12.1 12.0 - 15.0 g/dL   HCT 62.1 30.8 - 65.7 %   MCV 93.9 80.0 - 100.0 fL   MCH 28.4 26.0 - 34.0 pg   MCHC 30.3 30.0 - 36.0 g/dL   RDW 84.6 96.2 - 95.2 %   Platelets 270 150 - 400 K/uL   nRBC 0.0 0.0 - 0.2 %   Neutrophils Relative % 71 %   Neutro Abs 9.0 (H) 1.7 - 7.7 K/uL   Lymphocytes Relative 13 %   Lymphs Abs 1.6 0.7 - 4.0 K/uL   Monocytes Relative 13 %   Monocytes Absolute 1.6 (H) 0.1 - 1.0 K/uL   Eosinophils Relative 1 %   Eosinophils Absolute 0.2 0.0 - 0.5 K/uL   Basophils Relative 1 %   Basophils Absolute 0.1 0.0 - 0.1 K/uL   Immature Granulocytes 1 %   Abs Immature Granulocytes 0.10 (H) 0.00 - 0.07 K/uL    Comment: Performed at [facility], [address] [place], [place] [address]  Basic metabolic panel     Status: Abnormal   Collection Time: 03/01/19  4:03 AM  Result Value Ref Range   Sodium 142 135 - 145 mmol/L   Potassium 4.0 3.5 - 5.1 mmol/L   Chloride 102 98 - 111 mmol/L   CO2 31 22 - 32 mmol/L   Glucose, Bld 116 (H) 70 - 99 mg/dL   BUN 15 8 - 23 mg/dL   Creatinine, Ser 4.40 0.44 - 1.00 mg/dL   Calcium 8.9 8.9 - 10.2 mg/dL   GFR calc non Af Amer >60 >60 mL/min   GFR calc Af Amer >  60 >60 mL/min   Anion gap 9 5 - 15    Comment: Performed at [facility], [address] [place], [place] [address]  Troponin I - Once     Status: None   Collection Time: 03/01/19  4:03 AM  Result Value Ref Range   Troponin I <0.03 <0.03 ng/mL    Comment: Performed at [facility], [address] [place], [place] [address]   Ct Head Wo Contrast  Result Date: 03/01/2019 CLINICAL DATA:  Fall in room, increased frontal swelling EXAM: CT HEAD WITHOUT CONTRAST TECHNIQUE: Contiguous axial images were obtained from the base of the skull through the vertex without intravenous  contrast. COMPARISON:  02/28/2019 FINDINGS: Brain: No evidence of acute infarction, hemorrhage, hydrocephalus, extra-axial collection or mass lesion/mass effect. Cortical and central atrophy. Subcortical white matter and periventricular small vessel ischemic changes. Vascular: Intracranial atherosclerosis. Skull: Normal. Negative for fracture or focal lesion. Sinuses/Orbits: The visualized paranasal sinuses are essentially clear. The mastoid air cells are unopacified. Other: Large extracranial hematoma overlying the left paramidline frontal bone (series 3/image 10), new. Additional small to moderate extracranial hematoma overlying the left frontal bone (series 3/image 17), unchanged. Associated soft tissue swelling, including overlying the left orbit and lateral zygoma, unchanged. IMPRESSION: New large extracranial hematoma overlying the left frontal bone. No evidence of calvarial fracture. Otherwise unchanged, as above. No evidence of acute intracranial abnormality. Electronically Signed   By: Charline Bills M.D.   On: 03/01/2019 02:17   Ct Head Wo Contrast  Result Date: 03/01/2019 CLINICAL DATA:  Unwitnessed fall, left eye hematoma, on Eliquis, dementia EXAM: CT HEAD WITHOUT CONTRAST CT MAXILLOFACIAL WITHOUT CONTRAST TECHNIQUE: Multidetector CT imaging of the head and maxillofacial structures were performed using the standard protocol without intravenous contrast. Multiplanar CT image reconstructions of the maxillofacial structures were also generated. COMPARISON:  CT head dated 01/05/2018 FINDINGS: Motion degraded images. CT HEAD FINDINGS Brain: No evidence of acute infarction, hemorrhage, hydrocephalus, extra-axial collection or mass lesion/mass effect. Global cortical atrophy. Subcortical white matter and periventricular small vessel ischemic changes. Vascular: Intracranial atherosclerosis. Skull: Normal. Negative for fracture or focal lesion. Other: 1.1 x 2.5 cm extracranial hematoma overlying the left  frontal bone (series 2/image 16). Additional soft tissue swelling. CT MAXILLOFACIAL FINDINGS Osseous: No evidence of maxillofacial fracture. Mandible is intact. Bilateral mandibular condyles are well-seated in the TMJs. Orbits: The bilateral orbits, including the globes and retroconal soft tissues, are within normal limits. Sinuses: The visualized paranasal sinuses are essentially clear. The mastoid air cells are unopacified. Soft tissues: Soft tissue swelling overlying the left lateral orbit, zygoma, and lateral maxilla. IMPRESSION: Soft tissue swelling/extracranial hematoma overlying the left frontal bone. No evidence of calvarial fracture. No evidence of acute intracranial abnormality. Atrophy with small vessel ischemic changes. Soft tissue swelling overlying the left lateral orbit, zygoma, and lateral maxilla. Underlying globe and retroconal soft tissues are within normal limits. No evidence of maxillofacial fracture. Electronically Signed   By: Charline Bills M.D.   On: 03/01/2019 00:00   Dg Shoulder Left  Result Date: 03/01/2019 CLINICAL DATA:  Unwitnessed fall. EXAM: LEFT SHOULDER - 2+ VIEW COMPARISON:  None. FINDINGS: The humeral head is well-formed and located. The subacromial, glenohumeral and acromioclavicular joint spaces are intact. Severe acromioclavicular joint space narrowing and undersurface spurring consistent with degenerative change. No destructive bony lesions. Soft tissue planes are non-suspicious. Cardiac pacemaker battery prep at projects LEFT chest. IMPRESSION: 1. No fracture deformity or dislocation. Electronically Signed   By: Michel Santee.D.  On: 03/01/2019 00:06   Ct Maxillofacial Wo Cm  Result Date: 03/01/2019 CLINICAL DATA:  Unwitnessed fall, left eye hematoma, on Eliquis, dementia EXAM: CT HEAD WITHOUT CONTRAST CT MAXILLOFACIAL WITHOUT CONTRAST TECHNIQUE: Multidetector CT imaging of the head and maxillofacial structures were performed using the standard protocol  without intravenous contrast. Multiplanar CT image reconstructions of the maxillofacial structures were also generated. COMPARISON:  CT head dated 01/05/2018 FINDINGS: Motion degraded images. CT HEAD FINDINGS Brain: No evidence of acute infarction, hemorrhage, hydrocephalus, extra-axial collection or mass lesion/mass effect. Global cortical atrophy. Subcortical white matter and periventricular small vessel ischemic changes. Vascular: Intracranial atherosclerosis. Skull: Normal. Negative for fracture or focal lesion. Other: 1.1 x 2.5 cm extracranial hematoma overlying the left frontal bone (series 2/image 16). Additional soft tissue swelling. CT MAXILLOFACIAL FINDINGS Osseous: No evidence of maxillofacial fracture. Mandible is intact. Bilateral mandibular condyles are well-seated in the TMJs. Orbits: The bilateral orbits, including the globes and retroconal soft tissues, are within normal limits. Sinuses: The visualized paranasal sinuses are essentially clear. The mastoid air cells are unopacified. Soft tissues: Soft tissue swelling overlying the left lateral orbit, zygoma, and lateral maxilla. IMPRESSION: Soft tissue swelling/extracranial hematoma overlying the left frontal bone. No evidence of calvarial fracture. No evidence of acute intracranial abnormality. Atrophy with small vessel ischemic changes. Soft tissue swelling overlying the left lateral orbit, zygoma, and lateral maxilla. Underlying globe and retroconal soft tissues are within normal limits. No evidence of maxillofacial fracture. Electronically Signed   By: Charline Bills M.D.   On: 03/01/2019 00:00    Review of Systems  Constitutional: Negative for chills and fever.  HENT: Negative for sore throat and tinnitus.   Eyes: Negative for blurred vision and redness.  Respiratory: Negative for cough and shortness of breath.   Cardiovascular: Negative for chest pain, palpitations, orthopnea and PND.  Gastrointestinal: Negative for abdominal pain,  diarrhea, nausea and vomiting.  Genitourinary: Negative for dysuria, frequency and urgency.  Musculoskeletal: Positive for falls. Negative for joint pain and myalgias.  Skin: Negative for rash.       No lesions  Neurological: Negative for speech change, focal weakness and weakness.  Endo/Heme/Allergies: Does not bruise/bleed easily.       No temperature intolerance  Psychiatric/Behavioral: Negative for depression and suicidal ideas.    Blood pressure (!) 144/87, pulse 95, temperature 97.9 F (36.6 C), temperature source Oral, resp. rate 15, height 5\' 9"  (1.753 m), weight 63.5 kg, SpO2 98 %. Physical Exam  Vitals reviewed. Constitutional: She is oriented to person, place, and time. She appears well-developed and well-nourished. No distress.  HENT:  Head: Normocephalic.  Mouth/Throat: Oropharynx is clear and moist.  Large hematoma of head  Eyes: Pupils are equal, round, and reactive to light. Conjunctivae and EOM are normal. No scleral icterus.  Neck: Normal range of motion. Neck supple. No JVD present. No tracheal deviation present. No thyromegaly present.  Cardiovascular: Normal rate, regular rhythm and normal heart sounds. Exam reveals no gallop and no friction rub.  No murmur heard. Respiratory: Effort normal and breath sounds normal.  GI: Soft. Bowel sounds are normal. She exhibits no distension. There is no abdominal tenderness.  Genitourinary:    Genitourinary Comments: Deferred   Musculoskeletal: Normal range of motion.  Lymphadenopathy:    She has no cervical adenopathy.  Neurological: She is alert and oriented to person, place, and time. No cranial nerve deficit. She exhibits normal muscle tone.  Skin: Skin is warm and dry. No rash noted. No erythema.  Psychiatric:  She has a normal mood and affect. Her behavior is normal.  Thought and judgment difficult to assess     Assessment/Plan This is an 85 year old female admitted for head trauma. 1.  Head trauma: Large hematoma  without intracranial bleed.  The patient is on Eliquis.  Will observe for safety and assess for any symptoms of late bleeding. 2.  Atrial fibrillation: Rate controlled; I have held Eliquis for now.  Assess recurrent fall risk prior to discharge.  Concerned due to dementia 3.  CAD: Stable; resume Imdur. 4.  Diabetes mellitus type 2: Hold oral hypoglycemic agents.  Sliding scale insulin while hospitalized 5.  Hyperlipidemia: Continue statin therapy 6.  UTI: Given Macrobid in the emergency department.  Though allergy profile lists penicillins the patient received ceftriaxone 3 years ago without reaction.  Continue ceftriaxone.  Lactic acid is elevated but the patient does not consistently meet criteria for sepsis 7.  Dementia: Resume Aricept, Namenda, Zyprexa and Remeron once home medication list verified 8.  CHF: Chronic; systolic.  Last EF 35 to 40% April 2017.  Continue digoxin per home regimen 9.  Hypothyroidism: Continue Synthroid.  Check TSH. 10.  DVT prophylaxis: SCDs 11.  GI prophylaxis: None The patient is a DNR.  Time spent on admission orders and patient care approximately 45 minutes  Arnaldo Natal, MD 03/01/2019, 5:35 AM

## 2019-03-01 NOTE — Discharge Instructions (Addendum)
1.  Take antibiotic as prescribed (Macrobid 100 mg twice daily x7 days). 2.  Apply ice to affected area several times daily. 3.  Return to the ER for worsening symptoms, persistent vomiting, lethargy or other concerns.

## 2019-03-01 NOTE — Progress Notes (Signed)
Critical Lactic Acid of 2.9 called from lab. MD made aware. Order for NS@75 .

## 2019-03-01 NOTE — Care Management Obs Status (Signed)
MEDICARE OBSERVATION STATUS NOTIFICATION   Patient Details  Name: [name] MRN: [id number] Date of Birth: February 27, 1930   Medicare Observation Status Notification Given:  Yes: Telephone permission to sign given per Clarnce Flock (husband)    Gwenette Greet, RN 03/01/2019, 2:06 PM

## 2019-03-01 NOTE — ED Notes (Signed)
Pt found on the floor with nose bleeding at this RN at 0122. This RN and Beth, NT had just assisted pt with bedpan at 0103 and repositioned pt in bed. This RN was walking by room and noticed pt on floor. This RN alerted Dr. Dolores Frame who came to bedside immediately. Nose bleed was controlled and is still currently not bleeding at this time. Pt is alert and at her baseline mental status. New laceration above right eyebrow noted. Skin tear noted to left forearm that is new as well. Hematoma to left eye was present upon arrival to ED and does not appear to have increased in severity. This RN, Beth, NT and Matt,RN cleaned pt and assisted pt back to bed. Safety sitter ordered and at bedside. This RN asked pt why she did not ask for help to get up and pt stated "I yelled quietly and no one came, I dont know why I was trying to get up". New Yellow socks placed on pt and new fall arm band placed on pt due to initial ones being soiled. Beth, NT currently at bedside with pt. This RN will continue to monitor.

## 2019-03-01 NOTE — Progress Notes (Addendum)
Admitted early this morning.  Seen and examined by me later in the morning.  Patient is a little bit confused and out of it this morning.  She is resting comfortably, but will occasionally wake up and answer questions.  One-to-one sitter at bedside.   -Continue holding Eliquis -Will continue to monitor mental status closely -Continue ceftriaxone for presumed UTI.  Follow-up blood and urine culture. -Trend lactic acid -Chest x-ray with Kerley B lines- may need a dose of lasix -TSH elevated to 17- will check T3 and T4 and increase synthroid to daily -Will hold all sedating home meds in the setting of confusion  Leslie Carol, MD

## 2019-03-01 NOTE — Progress Notes (Addendum)
CODE SEPSIS - PHARMACY COMMUNICATION  **Broad Spectrum Antibiotics should be administered within 1 hour of Sepsis diagnosis**  Time Code Sepsis Called/Page Received: @ 0448  Antibiotics Ordered: Levofloxacin   Time of 1st antibiotic administration: @ 0459  Additional action taken by pharmacy:  Investigated beta-lactam allergy. Looks like patient was prescribed Keflex 02/2016 with no reported ADR. Contacted admitting MD about Abx adjustment.    Gardner Candle, PharmD, BCPS Clinical Pharmacist 03/01/2019 5:13 AM

## 2019-03-01 NOTE — ED Notes (Signed)
.. ED TO INPATIENT HANDOFF REPORT  ED Nurse Name and Phone #: Pattricia Boss [phone]  J Name/Age/Gender [name] 85 y.o. female Room/Bed: ED25A/ED25A  Code Status   Code Status: Prior  Home/SNF/Other Nursing Home Patient oriented to: self Is this baseline? Yes   Triage Complete: Triage complete  Chief Complaint [facility] - Fall  Triage Note Pt presents from [facility] with c/o unwitnessed fall. Pt foes not remember fall. Hematoma noted to left eye at this time. Pt alert at this time. Pt takes eliquis. Pt has hx of dementia. DNR on pt chart.    Allergies Allergies  Allergen Reactions  . Iohexol Other (See Comments)     Desc: CHEST PAIN, LOC AFTER HEART CATH, PT REFUSED DYE  Contrast Dye / Desc: CHEST PAIN, LOC AFTER HEART CATH, PT REFUSED DYE  . Penicillins Other (See Comments)    Has patient had a PCN reaction causing immediate rash, facial/tongue/throat swelling, SOB or lightheadedness with hypotension: No Has patient had a PCN reaction causing severe rash involving mucus membranes or skin necrosis: Unknown Has patient had a PCN reaction that required hospitalization: Unknown Has patient had a PCN reaction occurring within the last 10 years: Unknown If all of the above answers are "NO", then may proceed with Cephalosporin use.  Told not to take medication   . Sulfa Antibiotics Other (See Comments)    Told not to take med  . Sulfasalazine Other (See Comments)    Told not to take med  . Iodine Other (See Comments)    Level of Care/Admitting Diagnosis ED Disposition    ED Disposition Condition Comment   Admit  The patient appears reasonably stabilized for admission considering the current resources, flow, and capabilities available in the ED at this time, and I doubt any other [facility] requiring further screening and/or treatment in the ED prior to admission is  present.       B Medical/Surgery History Past Medical History:  Diagnosis Date  . A-fib (HCC)   .  Anxiety   . Arthritis   . Bronchitis 02/2016  . Coronary artery disease 12/2007  . Diabetes mellitus    x 10 yrs at least  . History of stress test 08/2011   negative for ischemia it was nongated and nondiagnostic and compatible with a possible scar or peri-infraction ischemia.   Marland Kitchen Hx of echocardiogram 09/2012   showed Ef 35-40% with no significant valve disease except for mild to moderate MR. normal RVSP and left atrium was severly dilated.  . Hypertension   . Pacemaker   . Polio   . Stroke (HCC)   . Thyroid disease    hypothyroidism   Past Surgical History:  Procedure Laterality Date  . 3 cardiac stents     she has had non-DES stent to the mid circumflex in 2009 and LAD stent to the mid-distal LAD  BMS June 18, 2010  . ABDOMINAL HYSTERECTOMY  1978   TAH/BSO  . APPENDECTOMY    . CARDIAC SURGERY    . CORONARY ANGIOPLASTY    . LEFT HEART CATHETERIZATION WITH CORONARY ANGIOGRAM N/A 05/10/2013   Procedure: LEFT HEART CATHETERIZATION WITH CORONARY ANGIOGRAM;  Surgeon: Lennette Bihari, MD;  Location: [facility] CATH LAB;  Service: Cardiovascular;  Laterality: N/A;  . PACEMAKER PLACEMENT  08/11/2010   Medtronic Adapta   . STRESS PERFUSION TEST  10/22/2009   MILD ISCHEMIA IN THE BASAL ANTEROLATERAL AND MID ANTEROLATERAL REGIONS.EF 52%.  . TONSILLECTOMY    . TRANSTHORACIC  ECHOCARDIOGRAM  01/17/2009   EF=>55%.IMPAIRED LV RELAXATION.LA IS MODERATELY DILATED.RA IS MILDLY DILATED. RV SYSTOLIC PRESSURE IS ELEVATED AT 30-40MMHG.MILD TO MODERATE TR.MILD MR.SINCE PRIOR TTE,BOTH ATRIA APPEAR TO BE MORE DILATED.     A IV Location/Drains/Wounds Patient Lines/Drains/Airways Status   Active Line/Drains/Airways    Name:   Placement date:   Placement time:   Site:   Days:   Peripheral IV 01/05/18 Left Antecubital   01/05/18    -    Antecubital   420   Peripheral IV 03/01/19 Left Antecubital   03/01/19    0407    Antecubital   less than 1   Peripheral IV 03/01/19 Right Wrist   03/01/19    0408    Wrist   less  than 1          Intake/Output Last 24 hours No intake or output data in the 24 hours ending 03/01/19 0408  Labs/Imaging Results for orders placed or performed during the hospital encounter of 02/28/19 (from the past 48 hour(s))  Urinalysis, Complete w Microscopic     Status: Abnormal   Collection Time: 02/28/19  1:00 AM  Result Value Ref Range   Color, Urine YELLOW (A) YELLOW   APPearance CLOUDY (A) CLEAR   Specific Gravity, Urine 1.008 1.005 - 1.030   pH 8.0 5.0 - 8.0   Glucose, UA NEGATIVE NEGATIVE mg/dL   Hgb urine dipstick SMALL (A) NEGATIVE   Bilirubin Urine NEGATIVE NEGATIVE   Ketones, ur NEGATIVE NEGATIVE mg/dL   Protein, ur NEGATIVE NEGATIVE mg/dL   Nitrite POSITIVE (A) NEGATIVE   Leukocytes,Ua LARGE (A) NEGATIVE   RBC / HPF 0-5 0 - 5 RBC/hpf   WBC, UA >50 (H) 0 - 5 WBC/hpf   Bacteria, UA MANY (A) NONE SEEN   Squamous Epithelial / LPF 0-5 0 - 5   Mucus PRESENT     Comment: Performed at [facility], [address] [place], [place] [address]   Ct Head Wo Contrast  Result Date: 03/01/2019 CLINICAL DATA:  Fall in room, increased frontal swelling EXAM: CT HEAD WITHOUT CONTRAST TECHNIQUE: Contiguous axial images were obtained from the base of the skull through the vertex without intravenous contrast. COMPARISON:  02/28/2019 FINDINGS: Brain: No evidence of acute infarction, hemorrhage, hydrocephalus, extra-axial collection or mass lesion/mass effect. Cortical and central atrophy. Subcortical white matter and periventricular small vessel ischemic changes. Vascular: Intracranial atherosclerosis. Skull: Normal. Negative for fracture or focal lesion. Sinuses/Orbits: The visualized paranasal sinuses are essentially clear. The mastoid air cells are unopacified. Other: Large extracranial hematoma overlying the left paramidline frontal bone (series 3/image 10), new. Additional small to moderate extracranial hematoma overlying the left frontal bone (series 3/image 17), unchanged.  Associated soft tissue swelling, including overlying the left orbit and lateral zygoma, unchanged. IMPRESSION: New large extracranial hematoma overlying the left frontal bone. No evidence of calvarial fracture. Otherwise unchanged, as above. No evidence of acute intracranial abnormality. Electronically Signed   By: Charline Bills M.D.   On: 03/01/2019 02:17   Ct Head Wo Contrast  Result Date: 03/01/2019 CLINICAL DATA:  Unwitnessed fall, left eye hematoma, on Eliquis, dementia EXAM: CT HEAD WITHOUT CONTRAST CT MAXILLOFACIAL WITHOUT CONTRAST TECHNIQUE: Multidetector CT imaging of the head and maxillofacial structures were performed using the standard protocol without intravenous contrast. Multiplanar CT image reconstructions of the maxillofacial structures were also generated. COMPARISON:  CT head dated 01/05/2018 FINDINGS: Motion degraded images. CT HEAD FINDINGS Brain: No evidence of acute infarction, hemorrhage, hydrocephalus, extra-axial collection or  mass lesion/mass effect. Global cortical atrophy. Subcortical white matter and periventricular small vessel ischemic changes. Vascular: Intracranial atherosclerosis. Skull: Normal. Negative for fracture or focal lesion. Other: 1.1 x 2.5 cm extracranial hematoma overlying the left frontal bone (series 2/image 16). Additional soft tissue swelling. CT MAXILLOFACIAL FINDINGS Osseous: No evidence of maxillofacial fracture. Mandible is intact. Bilateral mandibular condyles are well-seated in the TMJs. Orbits: The bilateral orbits, including the globes and retroconal soft tissues, are within normal limits. Sinuses: The visualized paranasal sinuses are essentially clear. The mastoid air cells are unopacified. Soft tissues: Soft tissue swelling overlying the left lateral orbit, zygoma, and lateral maxilla. IMPRESSION: Soft tissue swelling/extracranial hematoma overlying the left frontal bone. No evidence of calvarial fracture. No evidence of acute intracranial  abnormality. Atrophy with small vessel ischemic changes. Soft tissue swelling overlying the left lateral orbit, zygoma, and lateral maxilla. Underlying globe and retroconal soft tissues are within normal limits. No evidence of maxillofacial fracture. Electronically Signed   By: Charline Bills M.D.   On: 03/01/2019 00:00   Dg Shoulder Left  Result Date: 03/01/2019 CLINICAL DATA:  Unwitnessed fall. EXAM: LEFT SHOULDER - 2+ VIEW COMPARISON:  None. FINDINGS: The humeral head is well-formed and located. The subacromial, glenohumeral and acromioclavicular joint spaces are intact. Severe acromioclavicular joint space narrowing and undersurface spurring consistent with degenerative change. No destructive bony lesions. Soft tissue planes are non-suspicious. Cardiac pacemaker battery prep at projects LEFT chest. IMPRESSION: 1. No fracture deformity or dislocation. Electronically Signed   By: Awilda Metro M.D.   On: 03/01/2019 00:06   Ct Maxillofacial Wo Cm  Result Date: 03/01/2019 CLINICAL DATA:  Unwitnessed fall, left eye hematoma, on Eliquis, dementia EXAM: CT HEAD WITHOUT CONTRAST CT MAXILLOFACIAL WITHOUT CONTRAST TECHNIQUE: Multidetector CT imaging of the head and maxillofacial structures were performed using the standard protocol without intravenous contrast. Multiplanar CT image reconstructions of the maxillofacial structures were also generated. COMPARISON:  CT head dated 01/05/2018 FINDINGS: Motion degraded images. CT HEAD FINDINGS Brain: No evidence of acute infarction, hemorrhage, hydrocephalus, extra-axial collection or mass lesion/mass effect. Global cortical atrophy. Subcortical white matter and periventricular small vessel ischemic changes. Vascular: Intracranial atherosclerosis. Skull: Normal. Negative for fracture or focal lesion. Other: 1.1 x 2.5 cm extracranial hematoma overlying the left frontal bone (series 2/image 16). Additional soft tissue swelling. CT MAXILLOFACIAL FINDINGS Osseous: No  evidence of maxillofacial fracture. Mandible is intact. Bilateral mandibular condyles are well-seated in the TMJs. Orbits: The bilateral orbits, including the globes and retroconal soft tissues, are within normal limits. Sinuses: The visualized paranasal sinuses are essentially clear. The mastoid air cells are unopacified. Soft tissues: Soft tissue swelling overlying the left lateral orbit, zygoma, and lateral maxilla. IMPRESSION: Soft tissue swelling/extracranial hematoma overlying the left frontal bone. No evidence of calvarial fracture. No evidence of acute intracranial abnormality. Atrophy with small vessel ischemic changes. Soft tissue swelling overlying the left lateral orbit, zygoma, and lateral maxilla. Underlying globe and retroconal soft tissues are within normal limits. No evidence of maxillofacial fracture. Electronically Signed   By: Charline Bills M.D.   On: 03/01/2019 00:00    Pending Labs Unresulted Labs (From admission, onward)    Start     Ordered   03/01/19 0348  Culture, blood (routine x 2)  BLOOD CULTURE X 2,   STAT     03/01/19 0347   03/01/19 0348  CBC with Differential  Once,   STAT     03/01/19 0347   03/01/19 0348  Basic metabolic panel  Once,  STAT     03/01/19 0347   03/01/19 0348  Troponin I - Once  Once,   STAT     03/01/19 0347   03/01/19 0348  Lactic acid, plasma  Now then every 2 hours,   STAT     03/01/19 0347   03/01/19 0135  Urine culture  Once,   STAT     03/01/19 0134          Vitals/Pain Today's Vitals   02/28/19 2304 03/01/19 0210 03/01/19 0212 03/01/19 0230  BP:   (!) 146/104 128/71  Pulse:   63 82  Resp:   16 16  Temp: 97.9 F (36.6 C)  97.9 F (36.6 C)   TempSrc: Oral  Oral   SpO2:   97% 99%  Weight: 63.5 kg     Height: 5\' 9"  (1.753 m)     PainSc: 0-No pain 3       Isolation Precautions No active isolations  Medications Medications  nitrofurantoin (macrocrystal-monohydrate) (MACROBID) capsule 100 mg (100 mg Oral Given 03/01/19  0231)    Mobility walks with device High fall risk   Focused Assessments Neuro Assessment Handoff:  Swallow screen pass? Yes          Neuro Assessment:   Neuro Checks:      Last Documented NIHSS Modified Score:   Has TPA been given? No If patient is a Neuro Trauma and patient is going to OR before floor call report to 4N Charge nurse: [phone] or [phone]     R Recommendations: See Admitting Provider Note  Report given to:   Additional Notes:  High fall risk

## 2019-03-01 NOTE — ED Notes (Signed)
Date and time results received: 03/01/19 4:45 AM  (use smartphrase ".now" to insert current time)  Test: Lactic Acid Critical Value: 3.2  Name of Provider Notified: Dr. Dolores Frame  Orders Received? Or Actions Taken?: Orders Received - See Orders for details

## 2019-03-01 NOTE — Consult Note (Signed)
Pharmacy Antibiotic Note  [name] is a 85 y.o. female admitted on 02/28/2019 with UTI.  Pharmacy has been consulted for Levofloxacin dosing and to investigate beta-lactam allergy. Patient has an unknown PCN allergy and is not able to answer any questions about the reported allergy. According to admission notes from 02/2016 patient has taken cephalexin (Kelfex) without any reported ADRs.   Per Dr. Sheryle Hail Levofloxacin will be changed to Ceftriaxone for treated of UTI.   Plan: Start Ceftriaxone 1g IV every 24 hours.   Height: 5\' 9"  (175.3 cm) Weight: 140 lb (63.5 kg) IBW/kg (Calculated) : 66.2  Temp (24hrs), Avg:97.9 F (36.6 C), Min:97.9 F (36.6 C), Max:97.9 F (36.6 C)  Recent Labs  Lab 03/01/19 0400 03/01/19 0403  WBC  --  12.5*  CREATININE  --  0.55  LATICACIDVEN 3.2*  --     Estimated Creatinine Clearance: 50.6 mL/min (by C-G formula based on SCr of 0.55 mg/dL).    Allergies  Allergen Reactions  . Iohexol Other (See Comments)     Desc: CHEST PAIN, LOC AFTER HEART CATH, PT REFUSED DYE  Contrast Dye / Desc: CHEST PAIN, LOC AFTER HEART CATH, PT REFUSED DYE  . Penicillins Other (See Comments)    Has patient had a PCN reaction causing immediate rash, facial/tongue/throat swelling, SOB or lightheadedness with hypotension: No Has patient had a PCN reaction causing severe rash involving mucus membranes or skin necrosis: Unknown Has patient had a PCN reaction that required hospitalization: Unknown Has patient had a PCN reaction occurring within the last 10 years: Unknown If all of the above answers are "NO", then may proceed with Cephalosporin use.  Told not to take medication   . Sulfa Antibiotics Other (See Comments)    Told not to take med  . Sulfasalazine Other (See Comments)    Told not to take med  . Iodine Other (See Comments)    Antimicrobials this admission: 4/2 levofloxacin  >> x1 dose 4/3 Ceftriaxone >>   Microbiology results: 4/2 BCx: pending 4/2   UCx: pending  Thank you for allowing pharmacy to be a part of this patient's care.  Gardner Candle, PharmD, BCPS Clinical Pharmacist 03/01/2019 5:37 AM

## 2019-03-01 NOTE — TOC Initial Note (Signed)
Transition of Care [facility]) - Initial/Assessment Note    Patient Details  Name: [name] MRN: [id number] Date of Birth: 1930/05/29  Transition of Care [facility]) CM/SW Contact:    Leslie Greet, RN Phone Number: 03/01/2019, 2:14 PM  Clinical Narrative: Admitted to [facility] under observation status with the diagnosis of head trauma. A resident of [facility] x 2 years. Husband is [name] [phone]).  Sees physician at [facility] each month. Husband expressed concerns about how medications are being given  at [facility].                  Expected Discharge Plan: (unsure  at this time) Barriers to Discharge: Requiring sitter/restraints   Patient Goals and CMS Choice Patient states their goals for this hospitalization and ongoing recovery are:: (husband unsure at this time) CMS Medicare.gov Compare Post Acute Care list provided to:: (not given at this time) Choice offered to / list presented to : NA  Expected Discharge Plan and Services Expected Discharge Plan: (unsure  at this time) In-house Referral: Clinical Social Work Discharge Planning Services: NA Post Acute Care Choice: (unsure) Living arrangements for the past 2 months: (memory care unit)                 DME Arranged: N/A DME Agency: NA HH Arranged: NA HH Agency: NA  Prior Living Arrangements/Services Living arrangements for the past 2 months: (memory care unit) Lives with:: Facility Resident Patient language and need for interpreter reviewed:: No Do you feel safe going back to the place where you live?: (S) No(husband is conserned about safety at the facility)      Need for Family Participation in Patient Care: Yes (Comment) Care giver support system in place?: Yes (comment)   Criminal Activity/Legal Involvement Pertinent to Current Situation/Hospitalization: No - Comment as needed  Activities of Daily Living   ADL Screening (condition at time of admission) Is the patient deaf or  have difficulty hearing?: No Does the patient have difficulty seeing, even when wearing glasses/contacts?: Yes Does the patient have difficulty concentrating, remembering, or making decisions?: Yes Does the patient have difficulty dressing or bathing?: Yes Does the patient have difficulty walking or climbing stairs?: Yes  Permission Sought/Granted Permission sought to share information with : Case Manager Permission granted to share information with : Yes, Verbal Permission Granted              Emotional Assessment Appearance:: Appears stated age Attitude/Demeanor/Rapport: (sitter at the bedside) Affect (typically observed): (calm) Orientation: : Fluctuating Orientation (Suspected and/or reported Sundowners) Alcohol / Substance Use: Not Applicable Psych Involvement: No (comment)  Admission diagnosis:  Lower urinary tract infectious disease [N39.0] Hematoma [T14.8XXA] Contusion of face, initial encounter [S00.83XA] Injury of head, initial encounter [S09.90XA] Fall, initial encounter [W19.XXXA] Sepsis, due to unspecified organism, unspecified whether acute organ dysfunction present [facility]) [A41.9] Patient Active Problem List   Diagnosis Date Noted  . Head trauma 03/01/2019  . At high risk for falls 04/14/2018  . Asthma 04/19/2016  . Chronic respiratory failure (HCC) 04/19/2016  . COPD (chronic obstructive pulmonary disease) (HCC) 03/08/2016  . Hypoxemia 03/08/2016  . Acute bronchitis 03/02/2016  . Bronchitis 03/01/2016  . Long term (current) use of anticoagulants [Z79.01] 01/27/2016  . Prolapse of vaginal vault after hysterectomy 01/09/2016  . Atypical chest pain 06/03/2015  . Anxiety state 03/28/2015  . Chest pain 02/04/2015  . Hypertensive heart disease 05/12/2014  . Unspecified constipation 03/05/2014  . Abdominal pain, left lower quadrant  02/14/2014  . Abdominal pain, chronic, right lower quadrant 02/14/2014  . Change in stool 02/14/2014  . Encounter for therapeutic  drug monitoring 01/07/2014  . Ataxia 05/11/2013  . Unstable angina (HCC) 05/08/2013  . SSS (sick sinus syndrome)- MDT PTVDP 9/11 05/08/2013  . Cardiomyopathy, suspected to be secondary to AF, not CAD 50-55% at cath June 2014 05/08/2013  . Situational stress 05/08/2013  . Long term current use of anticoagulant therapy 02/19/2013  . History of colonic diverticulitis 01/26/2013  . CAD, non DES to CFX '09, LAD BMS 7/11. Cath OK June 2014 01/23/2013  . Chronic atrial fibrillation 01/23/2013  . GERD (gastroesophageal reflux disease) 01/23/2013  . Hypothyroidism 01/23/2013   PCP:  Devoria Glassing, NP Pharmacy:   [facility] - [address], [place] - [address] [place] [address] Phone: [phone] Fax: [phone]  [id number] - [place], [place] - [address] AT [facility] [address] [place] [address] Phone: [phone] Fax: [phone]  [id number] - [place], [place] - [address] AT [facility] OF [address] [place] [address] Phone: [phone] Fax: [phone]  [facility] - [place], [place] - [address] [place] [address] Phone: [phone] Fax: [phone]  [facility] - [place], [place] - [address] [place] [address] Phone: [phone] Fax: [phone]  [id number] [place], [place] - [address] AT [facility] OF [address] [place] [address] Phone: [phone] Fax: [phone]     Social Determinants of Health (SDOH) Interventions    Readmission Risk Interventions No flowsheet data found.

## 2019-03-02 DIAGNOSIS — I482 Chronic atrial fibrillation, unspecified: Secondary | ICD-10-CM | POA: Diagnosis not present

## 2019-03-02 DIAGNOSIS — S0990XA Unspecified injury of head, initial encounter: Secondary | ICD-10-CM | POA: Diagnosis not present

## 2019-03-02 DIAGNOSIS — N390 Urinary tract infection, site not specified: Secondary | ICD-10-CM | POA: Diagnosis not present

## 2019-03-02 DIAGNOSIS — R1310 Dysphagia, unspecified: Secondary | ICD-10-CM | POA: Diagnosis not present

## 2019-03-02 LAB — CBC
HCT: 33.6 % — ABNORMAL LOW (ref 36.0–46.0)
Hemoglobin: 10.5 g/dL — ABNORMAL LOW (ref 12.0–15.0)
MCH: 28.8 pg (ref 26.0–34.0)
MCHC: 31.3 g/dL (ref 30.0–36.0)
MCV: 92.3 fL (ref 80.0–100.0)
Platelets: 231 10*3/uL (ref 150–400)
RBC: 3.64 MIL/uL — ABNORMAL LOW (ref 3.87–5.11)
RDW: 15.1 % (ref 11.5–15.5)
WBC: 11.8 10*3/uL — ABNORMAL HIGH (ref 4.0–10.5)
nRBC: 0 % (ref 0.0–0.2)

## 2019-03-02 LAB — BASIC METABOLIC PANEL
Anion gap: 9 (ref 5–15)
BUN: 13 mg/dL (ref 8–23)
CO2: 26 mmol/L (ref 22–32)
Calcium: 8.1 mg/dL — ABNORMAL LOW (ref 8.9–10.3)
Chloride: 104 mmol/L (ref 98–111)
Creatinine, Ser: 0.6 mg/dL (ref 0.44–1.00)
GFR calc Af Amer: >60 mL/min (ref >60)
GFR calc non Af Amer: >60 mL/min (ref >60)
Glucose, Bld: 110 mg/dL — ABNORMAL HIGH (ref 70–99)
Potassium: 4 mmol/L (ref 3.5–5.1)
Sodium: 139 mmol/L (ref 135–145)

## 2019-03-02 LAB — T3: T3, Total: 99 ng/dL (ref 71–180)

## 2019-03-02 MED ORDER — APIXABAN 2.5 MG PO TABS
2.5000 mg | ORAL_TABLET | Freq: Two times a day (BID) | ORAL | Status: DC
  Administered 2019-03-02 – 2019-03-05 (×7): 2.5 mg via ORAL
  Filled 2019-03-02 (×7): qty 1

## 2019-03-02 MED ORDER — MEMANTINE HCL 5 MG PO TABS
10.0000 mg | ORAL_TABLET | Freq: Two times a day (BID) | ORAL | Status: DC
  Administered 2019-03-02 – 2019-03-07 (×11): 10 mg via ORAL
  Filled 2019-03-02 (×11): qty 2

## 2019-03-02 MED ORDER — DONEPEZIL HCL 5 MG PO TABS
5.0000 mg | ORAL_TABLET | Freq: Every day | ORAL | Status: DC
  Administered 2019-03-02 – 2019-03-06 (×5): 5 mg via ORAL
  Filled 2019-03-02 (×5): qty 1

## 2019-03-02 MED ORDER — DIPHENHYDRAMINE HCL 25 MG PO CAPS: 25.0000 mg | ORAL_CAPSULE | Freq: Four times a day (QID) | ORAL | Status: DC | PRN

## 2019-03-02 NOTE — Progress Notes (Signed)
[facility] - [place] at [facility]   PATIENT NAME: [name]    MR#:  [id number]  DATE OF BIRTH:  09/18/1930  SUBJECTIVE:   States she is feeling much better this morning.  She has a good appetite for breakfast.  She endorses left frontal headache where she hit her head.  No changes in vision, weakness, numbness, tingling.  REVIEW OF SYSTEMS:  Review of Systems  Constitutional: Negative for chills and fever.  HENT: Negative for congestion and sore throat.   Eyes: Negative for blurred vision and double vision.  Respiratory: Negative for cough and shortness of breath.   Cardiovascular: Negative for chest pain and palpitations.  Gastrointestinal: Negative for nausea and vomiting.  Genitourinary: Negative for dysuria and urgency.  Musculoskeletal: Positive for falls. Negative for back pain and neck pain.  Neurological: Positive for headaches. Negative for dizziness.  Psychiatric/Behavioral: Negative for depression. The patient is not nervous/anxious.     DRUG ALLERGIES:   Allergies  Allergen Reactions  . Iohexol Other (See Comments)     Desc: CHEST PAIN, LOC AFTER HEART CATH, PT REFUSED DYE  Contrast Dye / Desc: CHEST PAIN, LOC AFTER HEART CATH, PT REFUSED DYE  . Penicillins Other (See Comments)    Has patient had a PCN reaction causing immediate rash, facial/tongue/throat swelling, SOB or lightheadedness with hypotension: No Has patient had a PCN reaction causing severe rash involving mucus membranes or skin necrosis: Unknown Has patient had a PCN reaction that required hospitalization: Unknown Has patient had a PCN reaction occurring within the last 10 years: Unknown If all of the above answers are "NO", then may proceed with Cephalosporin use.  Told not to take medication   . Sulfa Antibiotics Other (See Comments)    Told not to take med  . Sulfasalazine Other (See Comments)    Told not to take med  . Iodine Other (See Comments)   VITALS:  Blood  pressure (!) 145/65, pulse 93, temperature 98.9 F (37.2 C), temperature source Oral, resp. rate 18, height 5\' 9"  (1.753 m), weight 63.5 kg, SpO2 95 %. PHYSICAL EXAMINATION:  Physical Exam  GENERAL: Laying in the bed with no acute distress.  HEENT: Normocephalic. Pupils equal, round, reactive to light and accommodation. No scleral icterus. Extraocular muscles intact. Oropharynx and nasopharynx clear. +hematoma of forehead, +ecchymoses surrounding left eye  NECK:  Supple, no jugular venous distention. No thyroid enlargement. LUNGS: Lungs are clear to auscultation bilaterally. No wheezes, crackles, rhonchi. No use of accessory muscles of respiration.  CARDIOVASCULAR: Irregularly irregular rhythm, mildly tachycardic.  S1, S2 normal. No murmurs, rubs, or gallops.  ABDOMEN: Soft, nontender, nondistended. Bowel sounds present.  EXTREMITIES: No pedal edema, cyanosis, or clubbing.  NEUROLOGIC: CN 2-12 intact, no focal deficits. +global weakness. Sensation intact throughout. Gait not checked.  PSYCHIATRIC: The patient is alert and oriented x 3.  SKIN: No obvious rash, lesion, or ulcer.  LABORATORY PANEL:  Female CBC Recent Labs  Lab 03/02/19 0409  WBC 11.8*  HGB 10.5*  HCT 33.6*  PLT 231   ------------------------------------------------------------------------------------------------------------------ Chemistries  Recent Labs  Lab 03/02/19 0409  NA 139  K 4.0  CL 104  CO2 26  GLUCOSE 110*  BUN 13  CREATININE 0.60  CALCIUM 8.1*   RADIOLOGY:  No results found. ASSESSMENT AND PLAN:   Head trauma with large hematoma- CT head negative for intracranial bleed.  Hemoglobin stable. -Will restart Eliquis today -Monitor  Dysphagia- noted to have some choking with eating breakfast this  morning. -SLP eval  E. Coli UTI- urine culture growing > 100,000 CFU E. coli  -Continue ceftriaxone -Follow-up susceptibilities  Chronic atrial fibrillation- rate-controlled -Continue digoxin and  metoprolol -Restart Eliquis today  CAD- Stable, no active chest pain -Continue Imdur.  Diabetes mellitus type 2-stable -SSI  Hyperlipidemia- stable -Continue statin therapy  Dementia- currently at mental status baseline -Continue Aricept, Namenda, Zyprexa and Remeron  Chronic systolic congestive heart failure- last EF 35 to 40% April 2017.  Does not appear volume overloaded. -Continue digoxin  Hypothyroidism- TSH 17 this admission, T4 elevated -Synthroid increased to 112 mcg daily this admission -Needs thyroid function testing repeated as an outpatient  DVT prophylaxis- eliquis  Plan for likely discharge back to SNF tomorrow.  All the records are reviewed and case discussed with Care Management/Social Worker. Management plans discussed with the patient, family and they are in agreement.  CODE STATUS: DNR  TOTAL TIME TAKING CARE OF THIS PATIENT: 45 minutes.   More than 50% of the time was spent in counseling/coordination of care: YES  POSSIBLE D/C tomorrow, DEPENDING ON CLINICAL CONDITION.   Jinny Blossom Nimisha Rathel M.D on 03/02/2019 at 10:27 AM  Between 7am to 6pm - Pager - [phone]  After 6pm go to [email or web address] - Social research officer, government  [facility] [place] [facility]  Office  [phone]  CC: Primary care physician; Devoria Glassing, NP  Note: This dictation was prepared with Dragon dictation along with smaller phrase technology. Any transcriptional errors that result from this process are unintentional.

## 2019-03-02 NOTE — Evaluation (Signed)
Clinical/Bedside Swallow Evaluation Patient Details  Name: EVANN ALDANA MRN: [id number] Date of Birth: 1929-12-26  Today's Date: 03/02/2019 Time: SLP Start Time (ACUTE ONLY): 0945 SLP Stop Time (ACUTE ONLY): 1025 SLP Time Calculation (min) (ACUTE ONLY): 40 min  Past Medical History:  Past Medical History:  Diagnosis Date  . A-fib (HCC)   . Anxiety   . Arthritis   . Bronchitis 02/2016  . Coronary artery disease 12/2007  . Diabetes mellitus    x 10 yrs at least  . History of stress test 08/2011   negative for ischemia it was nongated and nondiagnostic and compatible with a possible scar or peri-infraction ischemia.   Marland Kitchen Hx of echocardiogram 09/2012   showed Ef 35-40% with no significant valve disease except for mild to moderate MR. normal RVSP and left atrium was severly dilated.  . Hypertension   . Pacemaker   . Polio   . Stroke (HCC)   . Thyroid disease    hypothyroidism   Past Surgical History:  Past Surgical History:  Procedure Laterality Date  . 3 cardiac stents     she has had non-DES stent to the mid circumflex in 2009 and LAD stent to the mid-distal LAD  BMS June 18, 2010  . ABDOMINAL HYSTERECTOMY  1978   TAH/BSO  . APPENDECTOMY    . CARDIAC SURGERY    . CORONARY ANGIOPLASTY    . LEFT HEART CATHETERIZATION WITH CORONARY ANGIOGRAM N/A 05/10/2013   Procedure: LEFT HEART CATHETERIZATION WITH CORONARY ANGIOGRAM;  Surgeon: Lennette Bihari, MD;  Location: [facility] CATH LAB;  Service: Cardiovascular;  Laterality: N/A;  . PACEMAKER PLACEMENT  08/11/2010   Medtronic Adapta   . STRESS PERFUSION TEST  10/22/2009   MILD ISCHEMIA IN THE BASAL ANTEROLATERAL AND MID ANTEROLATERAL REGIONS.EF 52%.  . TONSILLECTOMY    . TRANSTHORACIC ECHOCARDIOGRAM  01/17/2009   EF=>55%.IMPAIRED LV RELAXATION.LA IS MODERATELY DILATED.RA IS MILDLY DILATED. RV SYSTOLIC PRESSURE IS ELEVATED AT 30-40MMHG.MILD TO MODERATE TR.MILD MR.SINCE PRIOR TTE,BOTH ATRIA APPEAR TO BE MORE DILATED.   HPI:  H&P 03/01/2019: BHARGAVI WALLOCK is an 85 y.o. female.  Chief Complaint: Fall  HPI: The patient with past medical history of atrial fibrillation, coronary artery disease, diabetes, hypothyroidism and history of stroke presents to the emergency department following a fall.  CT of her head was normal.  However, the patient fell once again once she was here in our emergency department and underwent another CT of her head demonstrating large cranial hematoma without evidence of fracture or intracranial bleed.  The patient appeared confused.  She was found to have a urinary tract infection.  Antibiotics were started and the emergency department staff called the hospitalist service for admission.    The patient's sitter reported difficulty with chewing/eating a regular diet.   Assessment / Plan / Recommendation Clinical Impression  This 5 year woman with large cranial hematoma and past medical history including dementia is presenting with oral dysphagia characterized by inattention and poor dentition.  The patient was able to masticate a crumbled graham cracker in applesauce with no clinical indicators of aspiration and no oral residue.  Recommend changing diet to dysphagia 2 with thin liquids.  The patient will need supervision and verbal cues to attend to eating and encourage adequate oral intake. SLP Visit Diagnosis: Dysphagia, oral phase (R13.11)    Aspiration Risk  Risk for inadequate nutrition/hydration    Diet Recommendation Dysphagia 2 (Fine chop);Thin liquid   Liquid Administration via: Cup;No straw Medication Administration:  Whole meds with puree Supervision: Full supervision/cueing for compensatory strategies Compensations: Minimize environmental distractions;Other (Comment)(Verbal cues to incourage intake) Postural Changes: Seated upright at 90 degrees;Remain upright for at least 30 minutes after po intake    Other  Recommendations Oral Care Recommendations: Oral care QID   Follow up Recommendations Other  (comment)(TBD)      Frequency and Duration min 2x/week  2 weeks       Prognosis Prognosis for Safe Diet Advancement: Fair Barriers to Reach Goals: Cognitive deficits      Swallow Study   General Date of Onset: 03/01/19 HPI: H&P 03/01/2019: MALAY DAVIDOV is an 85 y.o. female.  Chief Complaint: Fall  HPI: The patient with past medical history of atrial fibrillation, coronary artery disease, diabetes, hypothyroidism and history of stroke presents to the emergency department following a fall.  CT of her head was normal.  However, the patient fell once again once she was here in our emergency department and underwent another CT of her head demonstrating large cranial hematoma without evidence of fracture or intracranial bleed.  The patient appeared confused.  She was found to have a urinary tract infection.  Antibiotics were started and the emergency department staff called the hospitalist service for admission.  The patient's sitter reported difficulty with chewing/eating a regular diet. Type of Study: Bedside Swallow Evaluation Previous Swallow Assessment: None Diet Prior to this Study: Regular;Thin liquids Respiratory Status: Room air History of Recent Intubation: No Behavior/Cognition: Alert;Cooperative;Pleasant mood;Confused;Impulsive;Distractible;Requires cueing Oral Cavity Assessment: Within Functional Limits Oral Cavity - Dentition: Missing dentition;Poor condition Self-Feeding Abilities: Needs assist Patient Positioning: Upright in bed Baseline Vocal Quality: Normal Volitional Cough: Strong Volitional Swallow: Able to elicit    Oral/Motor/Sensory Function Overall Oral Motor/Sensory Function: Within functional limits   Ice Chips     Thin Liquid Thin Liquid: Within functional limits Presentation: Cup;Self Fed    Nectar Thick     Honey Thick     Puree Puree: Within functional limits Presentation: Self Fed;Spoon   Solid     Solid: Within functional limits Presentation:  Spoon(Crumbled graham cracker in applesauce)     Dollene Primrose, MS/CCC- SLP  Tedd Sias, Lynnell Dike 03/02/2019,10:26 AM

## 2019-03-02 NOTE — [place] FL2 (Addendum)
[place] MEDICAID FL2 LEVEL OF CARE SCREENING TOOL     IDENTIFICATION  Patient Name: [name] Birthdate: 08-15-1930 Sex: female Admission Date (Current Location): 02/28/2019  [place] and [place] Number:  Chiropodist and Address:  [facility], [address], [place], [place] [address]      Provider Number: [id number]  Attending Physician Name and Address:  Mayo, Allyn Kenner, MD  Relative Name and Phone Number:       Current Level of Care: Hospital Recommended Level of Care: SNF Prior Approval Number:    Date Approved/Denied:   PASRR Number:    Discharge Plan: Domiciliary (Rest home)    Current Diagnoses: Patient Active Problem List   Diagnosis Date Noted  . Head trauma 03/01/2019  . At high risk for falls 04/14/2018  . Asthma 04/19/2016  . Chronic respiratory failure (HCC) 04/19/2016  . COPD (chronic obstructive pulmonary disease) (HCC) 03/08/2016  . Hypoxemia 03/08/2016  . Acute bronchitis 03/02/2016  . Bronchitis 03/01/2016  . Long term (current) use of anticoagulants [Z79.01] 01/27/2016  . Prolapse of vaginal vault after hysterectomy 01/09/2016  . Atypical chest pain 06/03/2015  . Anxiety state 03/28/2015  . Chest pain 02/04/2015  . Hypertensive heart disease 05/12/2014  . Unspecified constipation 03/05/2014  . Abdominal pain, left lower quadrant 02/14/2014  . Abdominal pain, chronic, right lower quadrant 02/14/2014  . Change in stool 02/14/2014  . Encounter for therapeutic drug monitoring 01/07/2014  . Ataxia 05/11/2013  . Unstable angina (HCC) 05/08/2013  . SSS (sick sinus syndrome)- MDT PTVDP 9/11 05/08/2013  . Cardiomyopathy, suspected to be secondary to AF, not CAD 50-55% at cath June 2014 05/08/2013  . Situational stress 05/08/2013  . Long term current use of anticoagulant therapy 02/19/2013  . History of colonic diverticulitis 01/26/2013  . CAD, non DES to CFX '09, LAD BMS 7/11. Cath OK June 2014 01/23/2013  .  Chronic atrial fibrillation 01/23/2013  . GERD (gastroesophageal reflux disease) 01/23/2013  . Hypothyroidism 01/23/2013    Orientation RESPIRATION BLADDER Height & Weight     Self, Place  Normal Continent Weight: 139 lb 15.9 oz (63.5 kg) Height:  5\' 9"  (175.3 cm)  BEHAVIORAL SYMPTOMS/MOOD NEUROLOGICAL BOWEL NUTRITION STATUS  (none) (none) Continent Diet(Regular diet )  AMBULATORY STATUS COMMUNICATION OF NEEDS Skin   Limited Assist Verbally Normal                       Personal Care Assistance Level of Assistance  Bathing, Feeding, Dressing Bathing Assistance: Limited assistance Feeding assistance: Independent Dressing Assistance: Independent     Functional Limitations Info  Sight, Hearing, Speech Sight Info: Adequate Hearing Info: Adequate Speech Info: Adequate    SPECIAL CARE FACTORS FREQUENCY                       Contractures Contractures Info: Not present    Additional Factors Info  Allergies, Code Status Code Status Info: DNR Allergies Info:  Iohexol, Penicillins, Sulfa Antibiotics, Sulfasalazine, Iodine           Current Medications (03/02/2019):  This is the current hospital active medication list Current Facility-Administered Medications  Medication Dose Route Frequency Provider Last Rate Last Dose  . acetaminophen (TYLENOL) tablet 650 mg  650 mg Oral Q6H PRN Arnaldo Natal, MD       Or  . acetaminophen (TYLENOL) suppository 650 mg  650 mg Rectal Q6H PRN Arnaldo Natal, MD      .  cefTRIAXone (ROCEPHIN) 1 g in sodium chloride 0.9 % 100 mL IVPB  1 g Intravenous Q24H Arnaldo Natal, MD   Stopped at 03/02/19 [phone]  . digoxin (LANOXIN) tablet 0.125 mg  0.125 mg Oral Daily Mayo, Allyn Kenner, MD      . diphenhydrAMINE (BENADRYL) capsule 25 mg  25 mg Oral Q6H PRN Arnaldo Natal, MD      . divalproex (DEPAKOTE SPRINKLE) capsule 250 mg  250 mg Oral Q12H Mayo, Allyn Kenner, MD   250 mg at 03/01/19 2056  . docusate sodium (COLACE) capsule 100 mg   100 mg Oral BID Arnaldo Natal, MD   100 mg at 03/01/19 2101  . haloperidol lactate (HALDOL) injection 2 mg  2 mg Intravenous Q6H PRN Mayo, Allyn Kenner, MD      . ipratropium-albuterol (DUONEB) 0.5-2.5 (3) MG/3ML nebulizer solution 3 mL  3 mL Nebulization Q6H PRN Arnaldo Natal, MD      . ipratropium-albuterol (DUONEB) 0.5-2.5 (3) MG/3ML nebulizer solution 3 mL  3 mL Nebulization TID Mayo, Allyn Kenner, MD   3 mL at 03/02/19 0744  . levothyroxine (SYNTHROID, LEVOTHROID) tablet 112 mcg  112 mcg Oral Q0600 Campbell Stall, MD   112 mcg at 03/02/19 0505  . loratadine (CLARITIN) tablet 10 mg  10 mg Oral Daily Mayo, Allyn Kenner, MD      . MEDLINE mouth rinse  15 mL Mouth Rinse BID Mayo, Allyn Kenner, MD   15 mL at 03/01/19 2104  . metoprolol tartrate (LOPRESSOR) tablet 25 mg  25 mg Oral BID Campbell Stall, MD   25 mg at 03/01/19 2055  . mometasone-formoterol (DULERA) 200-5 MCG/ACT inhaler 2 puff  2 puff Inhalation BID Arnaldo Natal, MD   2 puff at 03/01/19 1947  . OLANZapine (ZYPREXA) tablet 2.5 mg  2.5 mg Oral QHS Mayo, Allyn Kenner, MD   2.5 mg at 03/01/19 2057  . ondansetron (ZOFRAN) tablet 4 mg  4 mg Oral Q6H PRN Arnaldo Natal, MD       Or  . ondansetron [facility]) injection 4 mg  4 mg Intravenous Q6H PRN Arnaldo Natal, MD      . oxyCODONE-acetaminophen (PERCOCET/ROXICET) 5-325 MG per tablet 1 tablet  1 tablet Oral Q6H PRN Arnaldo Natal, MD      . pravastatin (PRAVACHOL) tablet 20 mg  20 mg Oral QHS Mayo, Allyn Kenner, MD   20 mg at 03/01/19 2053     Discharge Medications: Please see discharge summary for a list of discharge medications.  Relevant Imaging Results:  Relevant Lab Results:   Additional Information    Giselle Brutus  Rinaldo Ratel, [address]

## 2019-03-03 ENCOUNTER — Observation Stay: Payer: Medicare HMO

## 2019-03-03 DIAGNOSIS — M25561 Pain in right knee: Secondary | ICD-10-CM | POA: Diagnosis not present

## 2019-03-03 DIAGNOSIS — R1310 Dysphagia, unspecified: Secondary | ICD-10-CM | POA: Diagnosis not present

## 2019-03-03 DIAGNOSIS — N390 Urinary tract infection, site not specified: Secondary | ICD-10-CM | POA: Diagnosis not present

## 2019-03-03 DIAGNOSIS — S0990XA Unspecified injury of head, initial encounter: Secondary | ICD-10-CM | POA: Diagnosis not present

## 2019-03-03 DIAGNOSIS — I482 Chronic atrial fibrillation, unspecified: Secondary | ICD-10-CM | POA: Diagnosis not present

## 2019-03-03 LAB — CBC
HCT: 34.9 % — ABNORMAL LOW (ref 36.0–46.0)
Hemoglobin: 10.9 g/dL — ABNORMAL LOW (ref 12.0–15.0)
MCH: 28.6 pg (ref 26.0–34.0)
MCHC: 31.2 g/dL (ref 30.0–36.0)
MCV: 91.6 fL (ref 80.0–100.0)
Platelets: 229 10*3/uL (ref 150–400)
RBC: 3.81 MIL/uL — ABNORMAL LOW (ref 3.87–5.11)
RDW: 15.3 % (ref 11.5–15.5)
WBC: 14.5 10*3/uL — ABNORMAL HIGH (ref 4.0–10.5)
nRBC: 0 % (ref 0.0–0.2)

## 2019-03-03 LAB — GLUCOSE, CAPILLARY
Glucose-Capillary: 127 mg/dL — ABNORMAL HIGH (ref 70–99)
Glucose-Capillary: 130 mg/dL — ABNORMAL HIGH (ref 70–99)
Glucose-Capillary: 120 mg/dL — ABNORMAL HIGH (ref 70–99)

## 2019-03-03 LAB — URINE CULTURE: Culture: >=100000 — AB

## 2019-03-03 MED ORDER — POLYETHYLENE GLYCOL 3350 17 G PO PACK
17.0000 g | PACK | Freq: Every day | ORAL | Status: DC | PRN
  Administered 2019-03-03 – 2019-03-07 (×3): 17 g via ORAL
  Filled 2019-03-03 (×3): qty 1

## 2019-03-03 MED ORDER — SODIUM CHLORIDE 0.9 % IV SOLN
INTRAVENOUS | Status: DC | PRN
  Administered 2019-03-03: 500 mL via INTRAVENOUS
  Administered 2019-03-04: 06:00:00 250 mL via INTRAVENOUS

## 2019-03-03 MED ORDER — INSULIN ASPART 100 UNIT/ML ~~LOC~~ SOLN: 0.0000 [IU] | Freq: Every day | SUBCUTANEOUS | Status: DC

## 2019-03-03 MED ORDER — INSULIN ASPART 100 UNIT/ML ~~LOC~~ SOLN
0.0000 [IU] | Freq: Three times a day (TID) | SUBCUTANEOUS | Status: DC
  Administered 2019-03-03 – 2019-03-07 (×5): 1 [IU] via SUBCUTANEOUS
  Filled 2019-03-03 (×5): qty 1

## 2019-03-03 NOTE — Plan of Care (Signed)
Pt c/o R knee pain, repositioned pt and distraction used with improvement. Pt sleeping between care.  Tylenol given once for fever with improvement.  No BM since admission. Miralax given.

## 2019-03-03 NOTE — Progress Notes (Signed)
[facility] - [place] at [facility]   PATIENT NAME: [name]    MR#:  [id number]  DATE OF BIRTH:  1930/05/19  SUBJECTIVE:  CHIEF COMPLAINT:   Chief Complaint  Patient presents with  . Fall   Patient sitting up and being assisted with breakfast this morning.  Nursing staff noted swelling with pain on the right knee joint when moved in bed.  X-rays of the right knee joint requested this morning.  No fevers. Patient pleasantly confused due to underlying dementia. Patient not requiring a sitter  REVIEW OF SYSTEMS:  Review of Systems  Constitutional: Negative for chills and fever.  HENT: Negative for ear pain and tinnitus.   Eyes: Negative for blurred vision and double vision.  Respiratory: Negative for cough and hemoptysis.   Cardiovascular: Negative for chest pain and palpitations.  Gastrointestinal: Negative for heartburn and nausea.  Genitourinary: Negative for dysuria and urgency.  Musculoskeletal: Negative for myalgias.       Right knee joint swelling and pain.  Skin: Negative for itching.       Bruise and ecchymosis of body right supraorbital area secondary to fall.  Neurological: Negative for dizziness and headaches.  Psychiatric/Behavioral: Negative for depression and hallucinations.      DRUG ALLERGIES:   Allergies  Allergen Reactions  . Iohexol Other (See Comments)     Desc: CHEST PAIN, LOC AFTER HEART CATH, PT REFUSED DYE  Contrast Dye / Desc: CHEST PAIN, LOC AFTER HEART CATH, PT REFUSED DYE  . Penicillins Other (See Comments)    Has patient had a PCN reaction causing immediate rash, facial/tongue/throat swelling, SOB or lightheadedness with hypotension: No Has patient had a PCN reaction causing severe rash involving mucus membranes or skin necrosis: Unknown Has patient had a PCN reaction that required hospitalization: Unknown Has patient had a PCN reaction occurring within the last 10 years: Unknown If all of the above answers are "NO",  then may proceed with Cephalosporin use.  Told not to take medication   . Sulfa Antibiotics Other (See Comments)    Told not to take med  . Sulfasalazine Other (See Comments)    Told not to take med  . Iodine Other (See Comments)   VITALS:  Blood pressure 126/72, pulse 93, temperature 98.1 F (36.7 C), temperature source Oral, resp. rate 17, height 5\' 9"  (1.753 m), weight 63.5 kg, SpO2 95 %. PHYSICAL EXAMINATION:   Physical Exam  Constitutional: She appears well-developed and well-nourished.  HENT:  Head: Normocephalic and atraumatic.  Right Ear: External ear normal.  Eyes: Pupils are equal, round, and reactive to light. Conjunctivae are normal.  Ecchymosis and bruise in the right periorbital area secondary to fall  Neck: Normal range of motion. Neck supple. No thyromegaly present.  Cardiovascular: Normal rate, regular rhythm and normal heart sounds.  Respiratory: Effort normal and breath sounds normal. No respiratory distress.  GI: Soft. Bowel sounds are normal. She exhibits no distension.  Musculoskeletal: Normal range of motion.        General: No edema.     Comments: Right knee swelling and tenderness.  No differential warmth.  Neurological: She is alert. No cranial nerve deficit.  Pleasantly confused due to underlying dementia  Skin: Skin is warm. No pallor.  Psychiatric: She has a normal mood and affect. Her behavior is normal.   LABORATORY PANEL:  Female CBC Recent Labs  Lab 03/03/19 0435  WBC 14.5*  HGB 10.9*  HCT 34.9*  PLT 229   ------------------------------------------------------------------------------------------------------------------  Chemistries  Recent Labs  Lab 03/02/19 0409  NA 139  K 4.0  CL 104  CO2 26  GLUCOSE 110*  BUN 13  CREATININE 0.60  CALCIUM 8.1*   RADIOLOGY:  No results found. ASSESSMENT AND PLAN:   Head trauma with large hematoma- CT head negative for intracranial bleed.  Hemoglobin stable. -Eliquis restarted previously  and hemoglobin remained stable. -Monitor  Dysphagia- noted to have some choking with eating breakfast previously  Seen by speech therapist.  Started on dysphagia 2 with thin liquids.  Tolerating well this morning.    E. Coli UTI- urine culture growing > 100,000 CFU E. coli  -Continue ceftriaxone.  Noted further increase in white blood cell count to 14,000. -Follow-up susceptibilities when available  Chronic atrial fibrillation- rate-controlled -Continue digoxin and metoprolol -Eliquis already restarted.  Right knee swelling Noted evidence of right knee swelling and tenderness on exam.  Patient had recent fall prior to admission. Right knee x-ray done this morning revealed soft tissue swelling about the anteromedial aspect of the knee with associated small knee joint effusion but without associated fracture or radiopaque body. Mild degenerative change involving the medial compartment of the knee. Requested for uric acid level. We will request for physical therapy to evaluate and treat   CAD- Stable, no active chest pain -Continue Imdur.  Diabetes mellitus type 2-stable -SSI  Hyperlipidemia- stable -Continue statin therapy  Dementia- currently at mental status baseline -Continue Aricept, Namenda, Zyprexa and Remeron  Chronic systolic congestive heart failure- last EF 35 to 40% April 2017.  Does not appear volume overloaded. -Continue digoxin  Hypothyroidism- TSH 17 this admission, T4 elevated -Synthroid increased to 112 mcg daily this admission -Needs thyroid function testing repeated as an outpatient  DVT prophylaxis- eliquis  Plan for likely discharge back to SNF tomorrow depending on reevaluation of right knee swelling.   All the records are reviewed and case discussed with Care Management/Social Worker. Management plans discussed with the patient,  and they are in agreement. Called and updated the husband on the treatment plans this morning and all  questions were answered.  CODE STATUS: DNR  TOTAL TIME TAKING CARE OF THIS PATIENT: 35 minutes.   More than 50% of the time was spent in counseling/coordination of care: YES  POSSIBLE D/C IN 1 DAY, DEPENDING ON CLINICAL CONDITION.   Zyeir Dymek M.D on 03/03/2019 at 9:58 AM  Between 7am to 6pm - Pager - [phone]  After 6pm go to [email or web address] - Social research officer, government  [facility] [place] [facility]  Office  [phone]  CC: Primary care physician; Devoria Glassing, NP  Note: This dictation was prepared with Dragon dictation along with smaller phrase technology. Any transcriptional errors that result from this process are unintentional.

## 2019-03-03 NOTE — Progress Notes (Signed)
  Speech Language Pathology Treatment: Dysphagia  Patient Details Name: [name] MRN: [id number] DOB: 1930/04/06 Today's Date: 03/03/2019 Time: 7591-6384 SLP Time Calculation (min) (ACUTE ONLY): 35 min  Assessment / Plan / Recommendation Clinical Impression  This very pleasant 85 y/o female was easily aroused to participate in PO trials for ongoing assessment of toleration of current Dysphagia II diet with thin liquids. Pt fed herself 10 sips thin liquid via cup without overt s/s aspiration requiring both hands to steady cup due to mild tremor. Pt also tolerated 2 bites soft solid (small bite graham cracker softened in applesauce) with adequate mastication and oral prep and no overt s/s aspiration. Mild A-P transit delay likely due to poor & missing dentition and decreased cognitive status; however A-P transit time was functional. Nsg reports toleration of current diet with no overt s/s aspiration. Rec continue with current Dysphagia II diet w/thin liquids with assistance at all meals to assist with self-feeding and provide cues to attend to task of eating to maintain adequate nutrition and enforce aspiration precautions. SLP to continue to f/u with toleration of diet 1-3 days.   HPI HPI: H&P 03/01/2019: [name] is an 85 y.o. female.  Chief Complaint: Fall  HPI: The patient with past medical history of atrial fibrillation, coronary artery disease, diabetes, hypothyroidism and history of stroke presents to the emergency department following a fall.  CT of her head was normal.  However, the patient fell once again once she was here in our emergency department and underwent another CT of her head demonstrating large cranial hematoma without evidence of fracture or intracranial bleed.  The patient appeared confused.  She was found to have a urinary tract infection.  Antibiotics were started and the emergency department staff called the hospitalist service for admission.  The patient's sitter reported  difficulty with chewing/eating a regular diet.      SLP Plan  Continue with current plan of care       Recommendations  Diet recommendations: Dysphagia 2 (fine chop);Thin liquid Liquids provided via: Cup;No straw Medication Administration: Whole meds with puree Supervision: Staff to assist with self feeding Compensations: Minimize environmental distractions;Slow rate;Small sips/bites Postural Changes and/or Swallow Maneuvers: Seated upright 90 degrees;Upright 30-60 min after meal                Oral Care Recommendations: Oral care QID Follow up Recommendations: Skilled Nursing facility SLP Visit Diagnosis: Dysphagia, oral phase (R13.11) Plan: Continue with current plan of care       GO                Lynlee Stratton, MA, CCC-SLP 03/03/2019, 11:08 AM

## 2019-03-03 NOTE — Evaluation (Addendum)
Physical Therapy Evaluation Patient Details Name: [name] MRN: [id number] DOB: 12/24/1929 Today's Date: 03/03/2019   History of Present Illness  [name] is a 85 y.o. female brought to the ED from [facility] status post unwitnessed fall with facial hematoma.  Patient has a history of dementia, takes Eliquis for atrial fibrillation, coronary artery disease, diabetes, hypothyroidism and history of stroke.  Since admission patient has had a fall, and yesterday had some L knee swelling with nurse reporting negative Xrays  Clinical Impression  Patient has regressed from yesterday's ind mobility with nursing, with increased R knee pain inhibiting mobility. Patietn is requiring modA for bed mobility to negotiate RLE, and is unable to complete STS transfer with maxA from PT, but does demonstrate good safety carry over of RW management. Patient is demonstrating fair sitting balance, standing balance unable to be assessed. Would benefit from skilled PT to address above deficits and promote optimal return to PLOF     Follow Up Recommendations SNF    Equipment Recommendations       Recommendations for Other Services       Precautions / Restrictions Precautions Precautions: Fall      Mobility  Bed Mobility Overal bed mobility: Needs Assistance Bed Mobility: Supine to Sit;Sit to Supine     Supine to sit: Mod assist Sit to supine: Mod assist   General bed mobility comments: Requires modA for negotiation of RLE d/t pain  Transfers Overall transfer level: Needs assistance Equipment used: Rolling walker (2 wheeled) Transfers: Sit to/from Stand Sit to Stand: Max assist         General transfer comment: PT assisted patient for 2 trials of standing, where patient is unable to stand d/t pain in RLE. Patient attempted to bear weight on LLE only and PT assisting on RLE, but is unable to complete hip ext need for stand  Ambulation/Gait         Gait velocity: Unable       Stairs            Wheelchair Mobility    Modified Rankin (Stroke Patients Only)       Balance Overall balance assessment: Needs assistance   Sitting balance-Leahy Scale: Fair       Standing balance-Leahy Scale: Zero                               Pertinent Vitals/Pain Pain Assessment: No/denies pain Faces Pain Scale: Hurts whole lot Pain Location: R knee Pain Descriptors / Indicators: Aching Pain Intervention(s): Limited activity within patient's tolerance;Repositioned;Monitored during session    Home Living Family/patient expects to be discharged to:: Assisted living               Home Equipment: Dan Humphreys - 2 wheels Additional Comments: [facility]    Prior Function Level of Independence: Independent with assistive device(s)         Comments: Patient unable to give history d/t dementia. Mentions that she used a RW at [employer] and thinks she was getting from her room to dining room with it independently      Hand Dominance        Extremity/Trunk Assessment   Upper Extremity Assessment Upper Extremity Assessment: Generalized weakness    Lower Extremity Assessment Lower Extremity Assessment: Generalized weakness    Cervical / Trunk Assessment Cervical / Trunk Assessment: Kyphotic  Communication   Communication: No difficulties(difficulty with task concentration)  Cognition  Arousal/Alertness: Lethargic Behavior During Therapy: Impulsive Overall Cognitive Status: History of cognitive impairments - at baseline                                 General Comments: dementia      General Comments      Exercises Other Exercises Other Exercises: Cued patient through bed mobility to sit EOB with PT assisting patient's RLE with patient able to comply with safe technique, but physical assistance d/t R knee pain. PT cued patient for proper stand transfer with education on hand placement and safety. Patietn able to  comply with safety cuing but unable to complete with assistance d/t RLE pain. Patietn required assistance with returning to bed with inability to bridge despite cuing d/t knee pain   Assessment/Plan    PT Assessment Patient needs continued PT services  PT Problem List Decreased strength;Decreased balance;Decreased mobility;Pain;Decreased coordination;Decreased range of motion;Decreased activity tolerance;Decreased safety awareness       PT Treatment Interventions DME instruction;Therapeutic exercise;Gait training;Balance training;Manual techniques;Neuromuscular re-education;Stair training;Functional mobility training;Therapeutic activities;Patient/family education    PT Goals (Current goals can be found in the Care Plan section)  Acute Rehab PT Goals Patient Stated Goal: Return to [facility] and walk with RW PT Goal Formulation: With patient Time For Goal Achievement: 03/17/19 Potential to Achieve Goals: Fair    Frequency Min 2X/week   Barriers to discharge        Co-evaluation               AM-PAC PT "6 Clicks" Mobility  Outcome Measure Help needed turning from your back to your side while in a flat bed without using bedrails?: A Little Help needed moving from lying on your back to sitting on the side of a flat bed without using bedrails?: A Lot Help needed moving to and from a bed to a chair (including a wheelchair)?: A Lot Help needed standing up from a chair using your arms (e.g., wheelchair or bedside chair)?: Total Help needed to walk in hospital room?: Total Help needed climbing 3-5 steps with a railing? : Total 6 Click Score: 10    End of Session Equipment Utilized During Treatment: Gait belt Activity Tolerance: Patient limited by pain Patient left: in bed;with call bell/phone within reach;with bed alarm set Nurse Communication: Mobility status PT Visit Diagnosis: Unsteadiness on feet (R26.81);Other abnormalities of gait and mobility (R26.89);Repeated falls  (R29.6);Muscle weakness (generalized) (M62.81);History of falling (Z91.81);Difficulty in walking, not elsewhere classified (R26.2);Pain Pain - Right/Left: Right Pain - part of body: Knee    Time: 1017-5102 PT Time Calculation (min) (ACUTE ONLY): 30 min   Charges:   PT Evaluation $PT Eval Moderate Complexity: 1 Mod PT Treatments $Therapeutic Activity: 8-22 mins        Staci Acosta PT, DPT  Staci Acosta 03/03/2019, 11:56 AM

## 2019-03-03 NOTE — Progress Notes (Signed)
Around 1815 pt's HR up to 150's Afib not sustaining. Rest of VSS. Denies chest pain. Pt tried to sit in the bed at that time. Dr Anne Hahn made aware.

## 2019-03-04 DIAGNOSIS — W19XXXA Unspecified fall, initial encounter: Secondary | ICD-10-CM | POA: Diagnosis present

## 2019-03-04 DIAGNOSIS — I2510 Atherosclerotic heart disease of native coronary artery without angina pectoris: Secondary | ICD-10-CM | POA: Diagnosis present

## 2019-03-04 DIAGNOSIS — F411 Generalized anxiety disorder: Secondary | ICD-10-CM | POA: Diagnosis present

## 2019-03-04 DIAGNOSIS — Z66 Do not resuscitate: Secondary | ICD-10-CM | POA: Diagnosis present

## 2019-03-04 DIAGNOSIS — N390 Urinary tract infection, site not specified: Secondary | ICD-10-CM | POA: Diagnosis present

## 2019-03-04 DIAGNOSIS — E785 Hyperlipidemia, unspecified: Secondary | ICD-10-CM | POA: Diagnosis present

## 2019-03-04 DIAGNOSIS — I428 Other cardiomyopathies: Secondary | ICD-10-CM | POA: Diagnosis present

## 2019-03-04 DIAGNOSIS — Z955 Presence of coronary angioplasty implant and graft: Secondary | ICD-10-CM | POA: Diagnosis not present

## 2019-03-04 DIAGNOSIS — I495 Sick sinus syndrome: Secondary | ICD-10-CM | POA: Diagnosis present

## 2019-03-04 DIAGNOSIS — I5022 Chronic systolic (congestive) heart failure: Secondary | ICD-10-CM | POA: Diagnosis present

## 2019-03-04 DIAGNOSIS — J9610 Chronic respiratory failure, unspecified whether with hypoxia or hypercapnia: Secondary | ICD-10-CM | POA: Diagnosis present

## 2019-03-04 DIAGNOSIS — B9620 Unspecified Escherichia coli [E. coli] as the cause of diseases classified elsewhere: Secondary | ICD-10-CM | POA: Diagnosis present

## 2019-03-04 DIAGNOSIS — Y92238 Other place in hospital as the place of occurrence of the external cause: Secondary | ICD-10-CM | POA: Diagnosis not present

## 2019-03-04 DIAGNOSIS — E039 Hypothyroidism, unspecified: Secondary | ICD-10-CM | POA: Diagnosis present

## 2019-03-04 DIAGNOSIS — I340 Nonrheumatic mitral (valve) insufficiency: Secondary | ICD-10-CM | POA: Diagnosis present

## 2019-03-04 DIAGNOSIS — M25461 Effusion, right knee: Secondary | ICD-10-CM | POA: Diagnosis present

## 2019-03-04 DIAGNOSIS — S0003XA Contusion of scalp, initial encounter: Secondary | ICD-10-CM | POA: Diagnosis present

## 2019-03-04 DIAGNOSIS — K219 Gastro-esophageal reflux disease without esophagitis: Secondary | ICD-10-CM | POA: Diagnosis present

## 2019-03-04 DIAGNOSIS — E119 Type 2 diabetes mellitus without complications: Secondary | ICD-10-CM | POA: Diagnosis present

## 2019-03-04 DIAGNOSIS — I110 Hypertensive heart disease with heart failure: Secondary | ICD-10-CM | POA: Diagnosis present

## 2019-03-04 DIAGNOSIS — Z950 Presence of cardiac pacemaker: Secondary | ICD-10-CM | POA: Diagnosis not present

## 2019-03-04 DIAGNOSIS — Z8673 Personal history of transient ischemic attack (TIA), and cerebral infarction without residual deficits: Secondary | ICD-10-CM | POA: Diagnosis not present

## 2019-03-04 DIAGNOSIS — I482 Chronic atrial fibrillation, unspecified: Secondary | ICD-10-CM | POA: Diagnosis present

## 2019-03-04 DIAGNOSIS — F0390 Unspecified dementia without behavioral disturbance: Secondary | ICD-10-CM | POA: Diagnosis present

## 2019-03-04 DIAGNOSIS — S0990XA Unspecified injury of head, initial encounter: Secondary | ICD-10-CM | POA: Diagnosis not present

## 2019-03-04 DIAGNOSIS — R1310 Dysphagia, unspecified: Secondary | ICD-10-CM | POA: Diagnosis present

## 2019-03-04 DIAGNOSIS — J449 Chronic obstructive pulmonary disease, unspecified: Secondary | ICD-10-CM | POA: Diagnosis present

## 2019-03-04 LAB — GLUCOSE, CAPILLARY
Glucose-Capillary: 90 mg/dL (ref 70–99)
Glucose-Capillary: 102 mg/dL — ABNORMAL HIGH (ref 70–99)
Glucose-Capillary: 107 mg/dL — ABNORMAL HIGH (ref 70–99)
Glucose-Capillary: 115 mg/dL — ABNORMAL HIGH (ref 70–99)

## 2019-03-04 LAB — BASIC METABOLIC PANEL
Anion gap: 7 (ref 5–15)
BUN: 23 mg/dL (ref 8–23)
CO2: 31 mmol/L (ref 22–32)
Calcium: 8.1 mg/dL — ABNORMAL LOW (ref 8.9–10.3)
Chloride: 102 mmol/L (ref 98–111)
Creatinine, Ser: 0.53 mg/dL (ref 0.44–1.00)
GFR calc Af Amer: >60 mL/min (ref >60)
GFR calc non Af Amer: >60 mL/min (ref >60)
Glucose, Bld: 122 mg/dL — ABNORMAL HIGH (ref 70–99)
Potassium: 4.2 mmol/L (ref 3.5–5.1)
Sodium: 140 mmol/L (ref 135–145)

## 2019-03-04 LAB — CBC
HCT: 35.2 % — ABNORMAL LOW (ref 36.0–46.0)
Hemoglobin: 10.9 g/dL — ABNORMAL LOW (ref 12.0–15.0)
MCH: 28.2 pg (ref 26.0–34.0)
MCHC: 31 g/dL (ref 30.0–36.0)
MCV: 91.2 fL (ref 80.0–100.0)
Platelets: 228 10*3/uL (ref 150–400)
RBC: 3.86 MIL/uL — ABNORMAL LOW (ref 3.87–5.11)
RDW: 15.1 % (ref 11.5–15.5)
WBC: 14.2 10*3/uL — ABNORMAL HIGH (ref 4.0–10.5)
nRBC: 0 % (ref 0.0–0.2)

## 2019-03-04 LAB — MAGNESIUM: Magnesium: 2 mg/dL (ref 1.7–2.4)

## 2019-03-04 LAB — URIC ACID: Uric Acid, Serum: 4 mg/dL (ref 2.5–7.1)

## 2019-03-04 NOTE — TOC Progression Note (Signed)
Transition of Care [facility]) - Progression Note    Patient Details  Name: [name] MRN: [id number] Date of Birth: Dec 20, 1929  [facility]) CM/SW Contact  Judi Cong, [place] Phone Number: 03/04/2019, 9:35 AM  Clinical Narrative:  The CSW consulted with the patient's spouse and the attending MD to discuss discharge planning in light of the patient's inability to stand and pivot without max assistance. The patient's spouse provided verbal permission to begin the referral for SNF for short term rehab with a plan for the patient to return to [facility] once she is more able to assist in her own transfers. The CSW has sent the referral and will provide bed offers to the patient's spouse once available. The patient will most likely discharge tomorrow pending insurance authorization and bed acceptance. TOC team is following.     Expected Discharge Plan: (unsure  at this time) Barriers to Discharge: Requiring sitter/restraints  Expected Discharge Plan and Services Expected Discharge Plan: (unsure  at this time) In-house Referral: Clinical Social Work Discharge Planning Services: NA Post Acute Care Choice: (unsure) Living arrangements for the past 2 months: (memory care unit)                 DME Arranged: N/A DME Agency: NA HH Arranged: NA HH Agency: NA   Social Determinants of Health (SDOH) Interventions    Readmission Risk Interventions No flowsheet data found.

## 2019-03-04 NOTE — Progress Notes (Signed)
[employer] [facility] [place] [facility]   PATIENT NAME: [name]    MR#:  [id number]  DATE OF BIRTH:  October 25, 1930  SUBJECTIVE:  CHIEF COMPLAINT:   Chief Complaint  Patient presents with  . Fall   No new complaint this morning.  No fevers.  Patient pleasantly confused due to underlying dementia but denies having any knee pain today.  REVIEW OF SYSTEMS:  Review of Systems  Constitutional: Negative for chills and fever.  HENT: Negative for ear pain and tinnitus.   Eyes: Negative for blurred vision and double vision.  Respiratory: Negative for cough and hemoptysis.   Cardiovascular: Negative for chest pain and palpitations.  Gastrointestinal: Negative for heartburn and nausea.  Genitourinary: Negative for dysuria and urgency.  Musculoskeletal: Negative for myalgias.       Right knee joint swelling and pain significantly improved  Skin: Negative for itching.       Bruise and ecchymosis of body right supraorbital area secondary to fall.  Neurological: Negative for dizziness and headaches.  Psychiatric/Behavioral: Negative for depression and hallucinations.      DRUG ALLERGIES:   Allergies  Allergen Reactions  . Iohexol Other (See Comments)     Desc: CHEST PAIN, LOC AFTER HEART CATH, PT REFUSED DYE  Contrast Dye / Desc: CHEST PAIN, LOC AFTER HEART CATH, PT REFUSED DYE  . Penicillins Other (See Comments)    Has patient had a PCN reaction causing immediate rash, facial/tongue/throat swelling, SOB or lightheadedness with hypotension: No Has patient had a PCN reaction causing severe rash involving mucus membranes or skin necrosis: Unknown Has patient had a PCN reaction that required hospitalization: Unknown Has patient had a PCN reaction occurring within the last 10 years: Unknown If all of the above answers are "NO", then may proceed with Cephalosporin use.  Told not to take medication   . Sulfa Antibiotics Other (See Comments)    Told not to take med  .  Sulfasalazine Other (See Comments)    Told not to take med  . Iodine Other (See Comments)   VITALS:  Blood pressure (!) 132/57, pulse 81, temperature 98.9 F (37.2 C), temperature source Oral, resp. rate (!) 22, height 5\' 9"  (1.753 m), weight 63.5 kg, SpO2 95 %. PHYSICAL EXAMINATION:   Physical Exam  Constitutional: She appears well-developed and well-nourished.  HENT:  Head: Normocephalic and atraumatic.  Right Ear: External ear normal.  Eyes: Pupils are equal, round, and reactive to light. Conjunctivae are normal.  Ecchymosis and bruise in the right periorbital area secondary to fall  Neck: Normal range of motion. Neck supple. No thyromegaly present.  Cardiovascular: Normal rate, regular rhythm and normal heart sounds.  Respiratory: Effort normal and breath sounds normal. No respiratory distress.  GI: Soft. Bowel sounds are normal. She exhibits no distension.  Musculoskeletal: Normal range of motion.        General: No edema.     Comments: Right knee swelling significantly improved.  No tenderness on exam today.   Neurological: She is alert. No cranial nerve deficit.  Pleasantly confused due to underlying dementia  Skin: Skin is warm. No pallor.  Psychiatric: She has a normal mood and affect. Her behavior is normal.   LABORATORY PANEL:  Female CBC Recent Labs  Lab 03/04/19 0434  WBC 14.2*  HGB 10.9*  HCT 35.2*  PLT 228   ------------------------------------------------------------------------------------------------------------------ Chemistries  Recent Labs  Lab 03/04/19 0434  NA 140  K 4.2  CL 102  CO2 31  GLUCOSE 122*  BUN 23  CREATININE 0.53  CALCIUM 8.1*  MG 2.0   RADIOLOGY:  Dg Knee 1-2 Views Right  Result Date: 03/03/2019 CLINICAL DATA:  Recent fall, now with anterior knee pain. EXAM: RIGHT KNEE - 1-2 VIEW COMPARISON:  None. FINDINGS: Small knee joint effusion. No evidence of lipohemarthrosis. No fracture or dislocation. Mild tricompartmental  degenerative change of the knee, worse within the medial compartment with joint space loss, subchondral sclerosis and osteophytosis. There is minimal spurring the tibial spines. A small amount of chondrocalcinosis is noted within lateral knee joint space. There is diffuse soft tissue swelling about the anteromedial aspect of the knee. No radiopaque body. IMPRESSION: 1. Soft tissue swelling about the anteromedial aspect of the knee with associated small knee joint effusion but without associated fracture or radiopaque body. 2. Mild degenerative change involving the medial compartment of the knee. Electronically Signed   By: Simonne Come M.D.   On: 03/03/2019 09:58   ASSESSMENT AND PLAN:   Head trauma with large hematoma- CT head negative for intracranial bleed.  Hemoglobin stable. -Eliquis restarted previously and hemoglobin remained stable. -Monitor  Dysphagia- noted to have some choking with eating breakfast previously  Seen by speech therapist.  Started on dysphagia 2 with thin liquids.  Tolerating well     E. Coli UTI- urine culture growing > 100,000 CFU E. coli sensitive to ceftriaxone -Continue ceftriaxone.  Chronic atrial fibrillation- rate-controlled -Continue digoxin and metoprolol -Eliquis already restarted.  Right knee swelling; significantly improved clinically. No tenderness on exam today.  Patient had recent fall prior to admission. Right knee x-ray done revealed soft tissue swelling about the anteromedial aspect of the knee with associated small knee joint effusion but without associated fracture or radiopaque body. Mild degenerative change involving the medial compartment of the knee. Requested for uric acid level. Patient seen by physical therapist.  Skilled nursing facility placement recommended  CAD- Stable, no active chest pain -Continue Imdur.  Diabetes mellitus type 2-stable -SSI  Hyperlipidemia- stable -Continue statin therapy  Dementia- currently at  mental status baseline -Continue Aricept, Namenda, Zyprexa and Remeron  Chronic systolic congestive heart failure- last EF 35 to 40% April 2017.  Does not appear volume overloaded. -Continue digoxin  Hypothyroidism- TSH 17 this admission, T4 elevated -Synthroid increased to 112 mcg daily this admission -Needs thyroid function testing repeated as an outpatient  DVT prophylaxis- eliquis  I discussed with patient's husband and he is agreeable to skilled nursing facility placement.  Social worker working on placement.  All the records are reviewed and case discussed with Care Management/Social Worker. Management plans discussed with the patient,  and they are in agreement. Called and updated the husband again this morning on the treatment plans and all questions were answered.  CODE STATUS: DNR  TOTAL TIME TAKING CARE OF THIS PATIENT: 36 minutes.   More than 50% of the time was spent in counseling/coordination of care: YES  POSSIBLE D/C IN 1 DAY, DEPENDING ON CLINICAL CONDITION.   Jeffrey Voth M.D on 03/04/2019 at 9:35 AM  Between 7am to 6pm - Pager - [phone]  After 6pm go to [email or web address] - Social research officer, government  [facility]  Office  [phone]  CC: Primary care physician; Devoria Glassing, NP  Note: This dictation was prepared with Dragon dictation along with smaller phrase technology. Any transcriptional errors that result from this process are unintentional.

## 2019-03-04 NOTE — Progress Notes (Signed)
Physical Therapy Treatment Patient Details Name: [name] MRN: [id number] DOB: 11-Jun-1930 Today's Date: 03/04/2019    History of Present Illness [name] is a 85 y.o. female brought to the ED from [facility] status post unwitnessed fall with facial hematoma.  Patient has a history of dementia, takes Eliquis for atrial fibrillation, coronary artery disease, diabetes, hypothyroidism and history of stroke.  Since admission patient has had a fall, and yesterday had some L knee swelling with nurse reporting negative Xrays    PT Comments    Patient is able to demonstrate more ind bed mobility this session with increased time and bedrails needed and increased RLE pain. Patient requires minA to stand, but maxA to remain standing d/t patient being unable to complete hip ext d/t "tailbone pain". Patient is able to complete seated exercises with encouragement needed from PT. Would benefit from skilled PT to address above deficits and promote optimal return to PLOF    Follow Up Recommendations  SNF     Equipment Recommendations       Recommendations for Other Services       Precautions / Restrictions Precautions Precautions: Fall Restrictions Weight Bearing Restrictions: No    Mobility  Bed Mobility Overal bed mobility: Needs Assistance Bed Mobility: Supine to Sit;Sit to Supine     Supine to sit: Modified independent (Device/Increase time) Sit to supine: Modified independent (Device/Increase time)   General bed mobility comments: Increased time and use of bedrails  Transfers Overall transfer level: Needs assistance Equipment used: Rolling walker (2 wheeled) Transfers: Sit to/from Stand Sit to Stand: Max assist;Min assist;From elevated surface         General transfer comment: Attempted 2 trials where patient is able to stand minA and is maxA to remain standing d/t patient reporting tail bone pain. Unable to complete stand with hip ext or take any  steps  Ambulation/Gait         Gait velocity: Unable       Stairs             Wheelchair Mobility    Modified Rankin (Stroke Patients Only)       Balance Overall balance assessment: Needs assistance   Sitting balance-Leahy Scale: Fair       Standing balance-Leahy Scale: Zero                              Cognition Arousal/Alertness: Lethargic Behavior During Therapy: Impulsive Overall Cognitive Status: History of cognitive impairments - at baseline                                 General Comments: dementia      Exercises General Exercises - Lower Extremity Long Arc Quad: AROM;Seated;20 reps(2x 10) Hip Flexion/Marching: AROM;Seated;20 reps(2x 10) Other Exercises Other Exercises: Cued patient through STS with minimal carry over of safety cuing from previous session. Able to complete STS transfer with safety following PT cuing, requiring maxA to remain standing d/t inability to ext at hips to complete standing d/t tailbone pain. Patient is able to complete some seated exercises with PT, but evenutally reports that she is too fatigued to continue    General Comments        Pertinent Vitals/Pain Faces Pain Scale: Hurts little more Pain Location: R knee Pain Descriptors / Indicators: Aching    Home Living Family/patient expects to be discharged  to:: Assisted living             Home Equipment: Dan Humphreys - 2 wheels Additional Comments: [facility]    Prior Function Level of Independence: Independent with assistive device(s)      Comments: Patient unable to give history d/t dementia. Mentions that she used a RW at [employer] and thinks she was getting from her room to dining room with it independently    PT Goals (current goals can now be found in the care plan section) Acute Rehab PT Goals Patient Stated Goal: Return to [facility] and walk with RW PT Goal Formulation: With patient Time For Goal Achievement:  03/17/19 Potential to Achieve Goals: Fair    Frequency    Min 2X/week      PT Plan      Co-evaluation              AM-PAC PT "6 Clicks" Mobility   Outcome Measure  Help needed turning from your back to your side while in a flat bed without using bedrails?: A Little Help needed moving from lying on your back to sitting on the side of a flat bed without using bedrails?: A Little Help needed moving to and from a bed to a chair (including a wheelchair)?: A Lot Help needed standing up from a chair using your arms (e.g., wheelchair or bedside chair)?: Total Help needed to walk in hospital room?: Total Help needed climbing 3-5 steps with a railing? : Total 6 Click Score: 11    End of Session Equipment Utilized During Treatment: Gait belt Activity Tolerance: Patient limited by pain Patient left: in bed;with call bell/phone within reach;with bed alarm set Nurse Communication: Mobility status PT Visit Diagnosis: Unsteadiness on feet (R26.81);Other abnormalities of gait and mobility (R26.89);Repeated falls (R29.6);Muscle weakness (generalized) (M62.81);History of falling (Z91.81);Difficulty in walking, not elsewhere classified (R26.2);Pain Pain - Right/Left: Right Pain - part of body: Knee     Time: 1020-1045 PT Time Calculation (min) (ACUTE ONLY): 25 min  Charges:  $Therapeutic Exercise: 8-22 mins $Therapeutic Activity: 8-22 mins                     Staci Acosta PT, DPT   Staci Acosta 03/04/2019, 11:50 AM

## 2019-03-05 ENCOUNTER — Inpatient Hospital Stay: Payer: Medicare HMO

## 2019-03-05 LAB — GLUCOSE, CAPILLARY
Glucose-Capillary: 101 mg/dL — ABNORMAL HIGH (ref 70–99)
Glucose-Capillary: 99 mg/dL (ref 70–99)
Glucose-Capillary: 91 mg/dL (ref 70–99)
Glucose-Capillary: 118 mg/dL — ABNORMAL HIGH (ref 70–99)

## 2019-03-05 MED ORDER — CEPHALEXIN 500 MG PO CAPS
500.0000 mg | ORAL_CAPSULE | Freq: Two times a day (BID) | ORAL | Status: AC
  Administered 2019-03-06 (×2): 500 mg via ORAL
  Filled 2019-03-05 (×2): qty 1

## 2019-03-05 NOTE — Progress Notes (Addendum)
[facility] - [place] at [facility]   PATIENT NAME: [name]    MR#:  [id number]  DATE OF BIRTH:  08-03-1930  SUBJECTIVE:  CHIEF COMPLAINT:   Chief Complaint  Patient presents with  . Fall    No fevers.  Patient pleasantly confused due to underlying dementia but denies having any knee pain . Noted some increase in the extracranial hematoma over the left supraorbital area compared to previously.  Follow-up CT head without contrast requested this morning.  Patient being assisted with breakfast this morning.  REVIEW OF SYSTEMS:  Review of Systems  Constitutional: Negative for chills and fever.  HENT: Negative for ear pain and tinnitus.   Eyes: Negative for blurred vision and double vision.  Respiratory: Negative for cough and hemoptysis.   Cardiovascular: Negative for chest pain and palpitations.  Gastrointestinal: Negative for heartburn and nausea.  Genitourinary: Negative for dysuria and urgency.  Musculoskeletal: Negative for myalgias.       Right knee joint swelling and pain significantly improved  Skin: Negative for itching.       Bruise and ecchymosis of body right supraorbital area secondary to fall.  Neurological: Negative for dizziness and headaches.  Psychiatric/Behavioral: Negative for depression and hallucinations.      DRUG ALLERGIES:   Allergies  Allergen Reactions  . Iohexol Other (See Comments)     Desc: CHEST PAIN, LOC AFTER HEART CATH, PT REFUSED DYE  Contrast Dye / Desc: CHEST PAIN, LOC AFTER HEART CATH, PT REFUSED DYE  . Penicillins Other (See Comments)    Has patient had a PCN reaction causing immediate rash, facial/tongue/throat swelling, SOB or lightheadedness with hypotension: No Has patient had a PCN reaction causing severe rash involving mucus membranes or skin necrosis: Unknown Has patient had a PCN reaction that required hospitalization: Unknown Has patient had a PCN reaction occurring within the last 10 years: Unknown If  all of the above answers are "NO", then may proceed with Cephalosporin use.  Told not to take medication   . Sulfa Antibiotics Other (See Comments)    Told not to take med  . Sulfasalazine Other (See Comments)    Told not to take med  . Iodine Other (See Comments)   VITALS:  Blood pressure 131/65, pulse 90, temperature (!) 97.5 F (36.4 C), temperature source Oral, resp. rate 19, height 5\' 9"  (1.753 m), weight 63.5 kg, SpO2 96 %. PHYSICAL EXAMINATION:   Physical Exam  Constitutional: She appears well-developed and well-nourished.  HENT:  Head: Normocephalic and atraumatic.  Right Ear: External ear normal.  Eyes: Pupils are equal, round, and reactive to light. Conjunctivae are normal.  Ecchymosis and bruise in the right periorbital area secondary to fall  Neck: Normal range of motion. Neck supple. No thyromegaly present.  Cardiovascular: Normal rate, regular rhythm and normal heart sounds.  Respiratory: Effort normal and breath sounds normal. No respiratory distress.  GI: Soft. Bowel sounds are normal. She exhibits no distension.  Musculoskeletal: Normal range of motion.        General: No edema.     Comments: Right knee swelling significantly improved.  No tenderness on exam today.   Neurological: She is alert. No cranial nerve deficit.  Pleasantly confused due to underlying dementia  Skin: Skin is warm. No pallor.  Psychiatric: She has a normal mood and affect. Her behavior is normal.   LABORATORY PANEL:  Female CBC Recent Labs  Lab 03/04/19 0434  WBC 14.2*  HGB 10.9*  HCT 35.2*  PLT 228   ------------------------------------------------------------------------------------------------------------------ Chemistries  Recent Labs  Lab 03/04/19 0434  NA 140  K 4.2  CL 102  CO2 31  GLUCOSE 122*  BUN 23  CREATININE 0.53  CALCIUM 8.1*  MG 2.0   RADIOLOGY:  No results found. ASSESSMENT AND PLAN:   Head trauma with large extracranial hematoma involving the left  supraorbital area- CT head negative for intracranial bleed.   -Eliquis restarted previously since hemoglobin remained stable. Noted some increase in the size of the extracranial hematoma in the left supraorbital area.  Requested for repeat CT head today.  We will hold off on Eliquis for now -Monitor  Dysphagia- noted to have some choking with eating breakfast previously  Seen by speech therapist.  Started on dysphagia 2 with thin liquids.  Tolerating well     E. Coli UTI- urine culture growing > 100,000 CFU E. coli sensitive to ceftriaxone -Continue ceftriaxone.  Chronic atrial fibrillation- rate-controlled -Continue digoxin and metoprolol Holding off on Eliquis for now due to evidence of some increase in the size of the extracranial hematoma and also pending results of CT head  Right knee swelling; significantly improved clinically. No tenderness on exam today.  Patient had recent fall prior to admission. Right knee x-ray done revealed soft tissue swelling about the anteromedial aspect of the knee with associated small knee joint effusion but without associated fracture or radiopaque body. Mild degenerative change involving the medial compartment of the knee. Uric acid level was normal at 4.0. Patient seen by physical therapist.  Skilled nursing facility placement recommended  CAD- Stable, no active chest pain -Continue Imdur.  Diabetes mellitus type 2-stable -SSI  Hyperlipidemia- stable -Continue statin therapy  Dementia- currently at mental status baseline -Continue Aricept, Namenda, Zyprexa and Remeron  Chronic systolic congestive heart failure- last EF 35 to 40% April 2017.  Does not appear volume overloaded. -Continue digoxin  Hypothyroidism- TSH 17 this admission, T4 elevated -Synthroid increased to 112 mcg daily this admission -Needs thyroid function testing repeated as an outpatient  DVT prophylaxis- eliquis placed on hold for now due to increasing size of  extracranial hematoma. Ordered SCDs  I discussed with patient's husband and he is agreeable to skilled nursing facility placement.  Social worker working on placement.  All the records are reviewed and case discussed with Care Management/Social Worker. Management plans discussed with the patient,  and they are in agreement. Called and updated the husband recently recently on the treatment plans and all questions were answered.  CODE STATUS: DNR  TOTAL TIME TAKING CARE OF THIS PATIENT: 35 minutes.   More than 50% of the time was spent in counseling/coordination of care: YES  POSSIBLE D/C IN 1-2 DAY, DEPENDING ON CLINICAL CONDITION.   Ahmiya Abee M.D on 03/05/2019 at 9:58 AM  Between 7am to 6pm - Pager - [phone]  After 6pm go to [email or web address] - Social research officer, government  [facility] [place] [facility]  Office  [phone]  CC: Primary care physician; Devoria Glassing, NP  Note: This dictation was prepared with Dragon dictation along with smaller phrase technology. Any transcriptional errors that result from this process are unintentional.

## 2019-03-05 NOTE — TOC Progression Note (Signed)
Transition of Care [facility]) - Progression Note    Patient Details  Name: AZARA COBEY MRN: [id number] Date of Birth: 04-02-1930  Transition of Care [facility]) CM/SW Contact  Ruthe Mannan, [place] Phone Number: 03/05/2019, 9:15 AM  Clinical Narrative:   CSW spoke with patient's husband Kermit Mothershed [phone] to give bed offers. Husband chose bed at [facility]. CS[facility] notified [facility] at [facility] of bed acceptance. Inetta Fermo will begin [facility] authorization. CSW will continue to follow for discharge planning.     Expected Discharge Plan: (unsure  at this time) Barriers to Discharge: Requiring sitter/restraints  Expected Discharge Plan and Services Expected Discharge Plan: (unsure  at this time) In-house Referral: Clinical Social Work Discharge Planning Services: NA Post Acute Care Choice: (unsure) Living arrangements for the past 2 months: (memory care unit)                 DME Arranged: N/A DME Agency: NA HH Arranged: NA HH Agency: NA   Social Determinants of Health (SDOH) Interventions    Readmission Risk Interventions No flowsheet data found.

## 2019-03-05 NOTE — Progress Notes (Signed)
PT Cancellation Note  Patient Details Name: [name] MRN: [id number] DOB: July 08, 1930   Cancelled Treatment:    Reason Eval/Treat Not Completed: Other (comment).  Pt noted with "increase in the extracranial hematoma over the left supraorbital area compared to previously".  Secure messaged MD Enid Baas regarding concerns and per MD's return message, will hold PT today and reassess tomorrow.  Hendricks Limes, PT 03/05/19, 11:40 AM [phone]

## 2019-03-05 NOTE — Progress Notes (Signed)
Informed RN of patient head. Seen patient the last two days. Left side of forehead seems to be more inflamed today. RN aware.

## 2019-03-06 ENCOUNTER — Inpatient Hospital Stay: Payer: Medicare HMO

## 2019-03-06 LAB — CULTURE, BLOOD (ROUTINE X 2)
Culture: NO GROWTH
Culture: NO GROWTH
Special Requests: ADEQUATE

## 2019-03-06 LAB — URINALYSIS, ROUTINE W REFLEX MICROSCOPIC
Bacteria, UA: NONE SEEN
Bilirubin Urine: NEGATIVE
Glucose, UA: NEGATIVE mg/dL
Ketones, ur: 5 mg/dL — AB
Nitrite: NEGATIVE
Protein, ur: NEGATIVE mg/dL
Specific Gravity, Urine: 1.021 (ref 1.005–1.030)
WBC, UA: >50 WBC/hpf — ABNORMAL HIGH (ref 0–5)
pH: 5 (ref 5.0–8.0)

## 2019-03-06 LAB — CBC
HCT: 35.3 % — ABNORMAL LOW (ref 36.0–46.0)
Hemoglobin: 11 g/dL — ABNORMAL LOW (ref 12.0–15.0)
MCH: 28.5 pg (ref 26.0–34.0)
MCHC: 31.2 g/dL (ref 30.0–36.0)
MCV: 91.5 fL (ref 80.0–100.0)
Platelets: 292 10*3/uL (ref 150–400)
RBC: 3.86 MIL/uL — ABNORMAL LOW (ref 3.87–5.11)
RDW: 14.8 % (ref 11.5–15.5)
WBC: 16.2 10*3/uL — ABNORMAL HIGH (ref 4.0–10.5)
nRBC: 0 % (ref 0.0–0.2)

## 2019-03-06 LAB — GLUCOSE, CAPILLARY
Glucose-Capillary: 113 mg/dL — ABNORMAL HIGH (ref 70–99)
Glucose-Capillary: 139 mg/dL — ABNORMAL HIGH (ref 70–99)
Glucose-Capillary: 125 mg/dL — ABNORMAL HIGH (ref 70–99)

## 2019-03-06 NOTE — Progress Notes (Signed)
Speech Language Pathology Treatment: Dysphagia  Patient Details Name: [name] MRN: [id number] DOB: Nov 03, 1930 Today's Date: 03/06/2019 Time: 2694-8546 SLP Time Calculation (min) (ACUTE ONLY): 45 min  Assessment / Plan / Recommendation Clinical Impression  Pt seen during Lunch meal today for toleration of recommended diet: Minced foods, thin liquids. NSG reported no overt s/s of aspiration w/ diet. Pt does wear an Upper Denture plate which she need for [facility] meal for effective mastication w/ the Minced foods diet. Pt required much support w/ Dentures, tray setup, and w/ feeding actually during this session - she often had eyes closed during session.  Pt consumed po trials of thin liquids via cup/straw (w/ monitoring) and minced foods w/ no overt s/s of aspiration noted; no decline in vocal quality or respiratory status/effort during/post meal. During the oral phase, pt exhibited increased mastication time/effort w/ the minced meats; less time w/ the vegetable and much less time w/ the puree consistency foods. Oral clearing achieved w/ alternating foods/liquids; time b/t boluses.  Pt required full feeding assistance during this meal. She often closed eyes and needed verbal/tactile cues to reattend to po tasks - suspect impact of the Dementia.  Per report of pt's Caregiver at home prior to this admission, pt had some difficulty w/ managing a regular consistency diet of solid foods. She appears to more easily masticate and orally manage the recommended Dysphagia level 2 (MINCED foods) w/ thin liquids diet. Recommend continue this consistency diet at Discharge in order to aid pt in meeting her nutritional needs safely; increase oral intake possibly. Recommend general aspiration precautions; FULL Feeding Assistance at this time for support. Recommend Pills in Puree - CRUSHED as needed for easier, safer swallowing overall.  ST services can be available for further needs/education w/ pt/family while  admitted.     HPI HPI: Pt is an 85 y.o. female.  Chief Complaint: Fall w/ hematoma on Left forward. HPI: The patient with past medical history of atrial fibrillation, coronary artery disease, diabetes, hypothyroidism and history of stroke presents to the emergency department following a fall.  CT of her head was normal.  However, the patient fell once again once she was here in our emergency department and underwent another CT of her head demonstrating large cranial hematoma without evidence of fracture or intracranial bleed.  The patient appeared confused.  She was found to have a urinary tract infection.  Antibiotics were started and the emergency department staff called the hospitalist service for admission.  The patient's sitter reported difficulty with chewing/eating a regular diet.      SLP Plan  All goals met(pt appears at her baseline per report)       Recommendations  Diet recommendations: Dysphagia 2 (fine chop);Thin liquid Liquids provided via: Cup;Straw Medication Administration: Crushed with puree(as needed; Whole if able to tolerate) Supervision: Staff to assist with self feeding;Full supervision/cueing for compensatory strategies Compensations: Minimize environmental distractions;Slow rate;Small sips/bites;Follow solids with liquid;Lingual sweep for clearance of pocketing Postural Changes and/or Swallow Maneuvers: Seated upright 90 degrees;Upright 30-60 min after meal                General recommendations: (Dietcian f/u) Oral Care Recommendations: Oral care BID;Staff/trained caregiver to provide oral care Follow up Recommendations: Skilled Nursing facility SLP Visit Diagnosis: Dysphagia, oral phase (R13.11) Plan: All goals met(pt appears at her baseline per report)       GO                 Orinda Kenner,  MS, CCC-SLP , 03/06/2019, 2:33 PM

## 2019-03-06 NOTE — Progress Notes (Signed)
Physical Therapy Treatment Patient Details Name: [name] MRN: [id number] DOB: 07-07-1930 Today's Date: 03/06/2019    History of Present Illness [name] is a 85 y.o. female brought to the ED from [place] house status post unwitnessed fall with facial hematoma.  Patient has a history of dementia, takes Eliquis for atrial fibrillation, coronary artery disease, diabetes, hypothyroidism and history of stroke.  Since admission patient has had a fall, and yesterday had some L knee swelling with nurse reporting negative Xrays    PT Comments    Pt requiring max assist semi-supine to sit and required 2 assist to perform two (3/4th) stands and one (1/2) stand with RW.  Pt c/o various pain during session's activities but not consistent and did not last long in any location.  2 assist to lay down in bed and to scoot up in bed.  Will continue to progress pt with strengthening and progressive mobility per pt tolerance.    Follow Up Recommendations  SNF     Equipment Recommendations  Rolling walker with 5" wheels;3in1 (PT);Wheelchair (measurements PT);Wheelchair cushion (measurements PT)    Recommendations for Other Services       Precautions / Restrictions Precautions Precautions: Fall Restrictions Weight Bearing Restrictions: No    Mobility  Bed Mobility Overal bed mobility: Needs Assistance Bed Mobility: Supine to Sit;Sit to Supine     Supine to sit: Max assist Sit to supine: +2 for physical assistance   General bed mobility comments: assist for trunk and B LE's semi-supine to/from sit; vc's for technique  Transfers Overall transfer level: Needs assistance Equipment used: Rolling walker (2 wheeled) Transfers: Sit to/from Stand Sit to Stand: Mod assist;Max assist;+2 physical assistance         General transfer comment: tactile cues for LE placement; assist to initiate stand; able to stand 3/4th stand 1st 2 trials and 1/2 stand 3rd trial  Ambulation/Gait          Gait velocity: not appropriate at this time       Stairs             Wheelchair Mobility    Modified Rankin (Stroke Patients Only)       Balance Overall balance assessment: Needs assistance Sitting-balance support: No upper extremity supported;Feet supported Sitting balance-Leahy Scale: Good Sitting balance - Comments: steady sitting reaching inside BOS                                    Cognition Arousal/Alertness: Awake/alert Behavior During Therapy: Impulsive Overall Cognitive Status: No family/caregiver present to determine baseline cognitive functioning                                        Exercises      General Comments   Nursing cleared pt for participation in physical therapy.  Pt agreeable to PT session.      Pertinent Vitals/Pain Pain Assessment: Faces Faces Pain Scale: No hurt(at rest) Pain Intervention(s): Limited activity within patient's tolerance;Monitored during session;Repositioned  Vitals (HR and O2 on room air) stable and WFL throughout treatment session.    Home Living                      Prior Function            PT Goals (current  goals can now be found in the care plan section) Acute Rehab PT Goals Patient Stated Goal: Return to [facility] and walk with RW PT Goal Formulation: With patient Time For Goal Achievement: 03/17/19 Potential to Achieve Goals: Fair Progress towards PT goals: Progressing toward goals    Frequency    Min 2X/week      PT Plan Current plan remains appropriate    Co-evaluation              AM-PAC PT "6 Clicks" Mobility   Outcome Measure  Help needed turning from your back to your side while in a flat bed without using bedrails?: A Lot Help needed moving from lying on your back to sitting on the side of a flat bed without using bedrails?: A Lot Help needed moving to and from a bed to a chair (including a wheelchair)?: Total Help needed standing up from a  chair using your arms (e.g., wheelchair or bedside chair)?: Total Help needed to walk in hospital room?: Total Help needed climbing 3-5 steps with a railing? : Total 6 Click Score: 8    End of Session Equipment Utilized During Treatment: Gait belt Activity Tolerance: Patient limited by pain Patient left: in bed;with call bell/phone within reach;with bed alarm set;Other (comment)(B heels elevated via pillow; bed in lowest position; fall mat in place) Nurse Communication: Mobility status PT Visit Diagnosis: Unsteadiness on feet (R26.81);Other abnormalities of gait and mobility (R26.89);Repeated falls (R29.6);Muscle weakness (generalized) (M62.81);History of falling (Z91.81);Difficulty in walking, not elsewhere classified (R26.2);Pain Pain - Right/Left: Right Pain - part of body: Knee     Time: 1950-9326 PT Time Calculation (min) (ACUTE ONLY): 29 min  Charges:  $Therapeutic Activity: 23-37 mins                     Hendricks Limes, PT 03/06/19, 3:33 PM [phone]

## 2019-03-06 NOTE — Progress Notes (Signed)
[facility] [place] [facility]   PATIENT NAME: Leslie ChampagneMona Sherburne    MR#:  [id number]  DATE OF BIRTH:  09/06/1930  SUBJECTIVE:  CHIEF COMPLAINT:   Chief Complaint  Patient presents with  . Fall    No fevers.  Patient pleasantly confused due to underlying dementia but denies having any knee pain and no headaches.Noted some increase in the extracranial hematoma over the left supraorbital yesterday and repeat CT head was done.  Eliquis was discontinued.   I called and updated patient's husband this morning on treatment and discharge plans  REVIEW OF SYSTEMS:  Review of Systems  Constitutional: Negative for chills and fever.  HENT: Negative for ear pain and tinnitus.   Eyes: Negative for blurred vision and double vision.  Respiratory: Negative for cough and hemoptysis.   Cardiovascular: Negative for chest pain and palpitations.  Gastrointestinal: Negative for heartburn and nausea.  Genitourinary: Negative for dysuria and urgency.  Musculoskeletal: Negative for myalgias.       Right knee joint swelling and pain significantly improved  Skin: Negative for itching.       Bruise and ecchymosis of body right supraorbital area with subcutaneous hematoma secondary to fall.  Neurological: Negative for dizziness and headaches.  Psychiatric/Behavioral: Negative for depression and hallucinations.      DRUG ALLERGIES:   Allergies  Allergen Reactions  . Iohexol Other (See Comments)     Desc: CHEST PAIN, LOC AFTER HEART CATH, PT REFUSED DYE  Contrast Dye / Desc: CHEST PAIN, LOC AFTER HEART CATH, PT REFUSED DYE  . Penicillins Other (See Comments)    Has patient had a PCN reaction causing immediate rash, facial/tongue/throat swelling, SOB or lightheadedness with hypotension: No Has patient had a PCN reaction causing severe rash involving mucus membranes or skin necrosis: Unknown Has patient had a PCN reaction that required hospitalization: Unknown Has patient had a PCN  reaction occurring within the last 10 years: Unknown If all of the above answers are "NO", then may proceed with Cephalosporin use.  Told not to take medication   . Sulfa Antibiotics Other (See Comments)    Told not to take med  . Sulfasalazine Other (See Comments)    Told not to take med  . Iodine Other (See Comments)   VITALS:  Blood pressure 112/62, pulse 99, temperature 98.3 F (36.8 C), temperature source Oral, resp. rate 18, height 5\' 9"  (1.753 m), weight 63.2 kg, SpO2 98 %. PHYSICAL EXAMINATION:   Physical Exam  Constitutional: She appears well-developed and well-nourished.  HENT:  Head: Normocephalic and atraumatic.  Right Ear: External ear normal.  Eyes: Pupils are equal, round, and reactive to light. Conjunctivae are normal.  Ecchymosis and bruise in the right periorbital area and subcutaneous hematoma secondary to fall  Neck: Normal range of motion. Neck supple. No thyromegaly present.  Cardiovascular: Normal rate, regular rhythm and normal heart sounds.  Respiratory: Effort normal and breath sounds normal. No respiratory distress.  GI: Soft. Bowel sounds are normal. She exhibits no distension.  Musculoskeletal: Normal range of motion.        General: No edema.     Comments: Right knee swelling significantly improved.  No tenderness on exam today.   Neurological: She is alert. No cranial nerve deficit.  Pleasantly confused due to underlying dementia  Skin: Skin is warm. No pallor.  Psychiatric: She has a normal mood and affect. Her behavior is normal.   LABORATORY PANEL:  Female CBC Recent Labs  Lab 03/06/19  0513  WBC 16.2*  HGB 11.0*  HCT 35.3*  PLT 292   ------------------------------------------------------------------------------------------------------------------ Chemistries  Recent Labs  Lab 03/04/19 0434  NA 140  K 4.2  CL 102  CO2 31  GLUCOSE 122*  BUN 23  CREATININE 0.53  CALCIUM 8.1*  MG 2.0   RADIOLOGY:  Dg Chest 1 View  Result  Date: 03/06/2019 CLINICAL DATA:  Shortness of breath EXAM: CHEST  1 VIEW COMPARISON:  03/01/2019 FINDINGS: Cardiac shadow is stable. Aortic calcifications are again seen. Pacing device is again noted and stable. Lungs are well aerated bilaterally. Previously seen interstitial changes have improved somewhat in the interval from the prior exam. Small left pleural effusion is noted. IMPRESSION: Slight improvement in the degree of interstitial edema. Small left effusion. Electronically Signed   By: Alcide Clever M.D.   On: 03/06/2019 10:39   ASSESSMENT AND PLAN:   Head trauma with large extracranial hematoma involving the left supraorbital area- CT head negative for intracranial bleed.   Noted some increase in the size of the extracranial hematoma in the left supraorbital area.  Eliquis already discontinued.  Repeat CT scan of the head done on 03/05/2019 revealed large left frontal scalp hematoma significantly larger compared to prior CT scan.  No acute intracranial pathology. -Monitor  Dysphagia- noted to have some choking with eating breakfast previously  Seen by speech therapist.  Started on dysphagia 2 with thin liquids.  Tolerating well     E. Coli UTI- urine culture growing > 100,000 CFU E. coli sensitive to ceftriaxone Patient was initially treated with IV Rocephin.  Currently on Keflex based on sensitivities. Noted slightly worsening of leukocytosis today but patient remains afebrile.  Repeat urinalysis requested. Chest x-ray done today with slight improvement in the degree of interstitial edema.  Small left effusion. Will monitor patient here tomorrow to ensure no fevers and no worsening of leukocytosis.  Chronic atrial fibrillation- rate-controlled -Continue digoxin and metoprolol Holding off on Eliquis for now due to evidence of some increase in the size of the extracranial hematoma   Right knee swelling; significantly improved clinically. No tenderness on exam today.  Patient had recent  fall prior to admission. Right knee x-ray done revealed soft tissue swelling about the anteromedial aspect of the knee with associated small knee joint effusion but without associated fracture or radiopaque body. Mild degenerative change involving the medial compartment of the knee. Uric acid level was normal at 4.0. Patient seen by physical therapist.  Skilled nursing facility placement recommended  CAD- Stable, no active chest pain -Continue Imdur.  Diabetes mellitus type 2-stable -SSI  Hyperlipidemia- stable -Continue statin therapy  Dementia- currently at mental status baseline -Continue Aricept, Namenda, Zyprexa and Remeron  Chronic systolic congestive heart failure- last EF 35 to 40% April 2017.  Does not appear volume overloaded. -Continue digoxin  Hypothyroidism- TSH 17 this admission, T4 elevated -Synthroid increased to 112 mcg daily this admission -Needs thyroid function testing repeated as an outpatient  DVT prophylaxis- eliquis placed on hold for now due to increasing size of extracranial hematoma. Ordered SCDs  I called and updated patient's husband this morning on treatment and discharge plans for possible discharge tomorrow to skilled nursing facility.  All questions were answered.  He is in agreement with the plan of care as outlined   All the records are reviewed and case discussed with Care Management/Social Worker. Management plans discussed with the patient,  and they are in agreement.  CODE STATUS: DNR  TOTAL TIME TAKING  CARE OF THIS PATIENT: 26 minutes.   More than 50% of the time was spent in counseling/coordination of care: YES  POSSIBLE D/C IN 1 DAY, DEPENDING ON CLINICAL CONDITION.   Ronnesha Mester M.D on 03/06/2019 at 10:51 AM  Between 7am to 6pm - Pager - [phone]  After 6pm go to [email or web address] - Social research officer, government  [facility]  Office  [phone]  CC: Primary care physician; Devoria Glassing, NP  Note:  This dictation was prepared with Dragon dictation along with smaller phrase technology. Any transcriptional errors that result from this process are unintentional.

## 2019-03-07 DIAGNOSIS — N905 Atrophy of vulva: Secondary | ICD-10-CM | POA: Diagnosis not present

## 2019-03-07 DIAGNOSIS — M7981 Nontraumatic hematoma of soft tissue: Secondary | ICD-10-CM | POA: Diagnosis not present

## 2019-03-07 DIAGNOSIS — F0390 Unspecified dementia without behavioral disturbance: Secondary | ICD-10-CM | POA: Diagnosis not present

## 2019-03-07 DIAGNOSIS — I10 Essential (primary) hypertension: Secondary | ICD-10-CM | POA: Diagnosis not present

## 2019-03-07 DIAGNOSIS — M6281 Muscle weakness (generalized): Secondary | ICD-10-CM | POA: Diagnosis not present

## 2019-03-07 DIAGNOSIS — J449 Chronic obstructive pulmonary disease, unspecified: Secondary | ICD-10-CM | POA: Diagnosis not present

## 2019-03-07 DIAGNOSIS — Z9181 History of falling: Secondary | ICD-10-CM | POA: Diagnosis not present

## 2019-03-07 DIAGNOSIS — Z130 Encounter for screening for diseases of the blood and blood-forming organs and certain disorders involving the immune mechanism: Secondary | ICD-10-CM | POA: Diagnosis not present

## 2019-03-07 DIAGNOSIS — I482 Chronic atrial fibrillation, unspecified: Secondary | ICD-10-CM | POA: Diagnosis not present

## 2019-03-07 DIAGNOSIS — R569 Unspecified convulsions: Secondary | ICD-10-CM | POA: Diagnosis not present

## 2019-03-07 DIAGNOSIS — R1312 Dysphagia, oropharyngeal phase: Secondary | ICD-10-CM | POA: Diagnosis not present

## 2019-03-07 DIAGNOSIS — R1310 Dysphagia, unspecified: Secondary | ICD-10-CM | POA: Diagnosis not present

## 2019-03-07 DIAGNOSIS — E039 Hypothyroidism, unspecified: Secondary | ICD-10-CM | POA: Diagnosis not present

## 2019-03-07 DIAGNOSIS — N390 Urinary tract infection, site not specified: Secondary | ICD-10-CM | POA: Diagnosis not present

## 2019-03-07 DIAGNOSIS — F0151 Vascular dementia with behavioral disturbance: Secondary | ICD-10-CM | POA: Diagnosis not present

## 2019-03-07 DIAGNOSIS — R404 Transient alteration of awareness: Secondary | ICD-10-CM | POA: Diagnosis not present

## 2019-03-07 DIAGNOSIS — R41841 Cognitive communication deficit: Secondary | ICD-10-CM | POA: Diagnosis not present

## 2019-03-07 DIAGNOSIS — M2550 Pain in unspecified joint: Secondary | ICD-10-CM | POA: Diagnosis not present

## 2019-03-07 DIAGNOSIS — Z7401 Bed confinement status: Secondary | ICD-10-CM | POA: Diagnosis not present

## 2019-03-07 DIAGNOSIS — D649 Anemia, unspecified: Secondary | ICD-10-CM | POA: Diagnosis not present

## 2019-03-07 DIAGNOSIS — S0990XA Unspecified injury of head, initial encounter: Secondary | ICD-10-CM | POA: Diagnosis not present

## 2019-03-07 DIAGNOSIS — I4891 Unspecified atrial fibrillation: Secondary | ICD-10-CM | POA: Diagnosis not present

## 2019-03-07 DIAGNOSIS — E785 Hyperlipidemia, unspecified: Secondary | ICD-10-CM | POA: Diagnosis not present

## 2019-03-07 DIAGNOSIS — S0990XD Unspecified injury of head, subsequent encounter: Secondary | ICD-10-CM | POA: Diagnosis not present

## 2019-03-07 DIAGNOSIS — E119 Type 2 diabetes mellitus without complications: Secondary | ICD-10-CM | POA: Diagnosis not present

## 2019-03-07 DIAGNOSIS — R319 Hematuria, unspecified: Secondary | ICD-10-CM | POA: Diagnosis not present

## 2019-03-07 LAB — CBC WITH DIFFERENTIAL/PLATELET
Abs Immature Granulocytes: 0.06 10*3/uL (ref 0.00–0.07)
Basophils Absolute: 0.1 10*3/uL (ref 0.0–0.1)
Basophils Relative: 1 %
Eosinophils Absolute: 0.2 10*3/uL (ref 0.0–0.5)
Eosinophils Relative: 2 %
HCT: 36.4 % (ref 36.0–46.0)
Hemoglobin: 11.1 g/dL — ABNORMAL LOW (ref 12.0–15.0)
Immature Granulocytes: 1 %
Lymphocytes Relative: 16 %
Lymphs Abs: 1.8 10*3/uL (ref 0.7–4.0)
MCH: 28.5 pg (ref 26.0–34.0)
MCHC: 30.5 g/dL (ref 30.0–36.0)
MCV: 93.3 fL (ref 80.0–100.0)
Monocytes Absolute: 1.7 10*3/uL — ABNORMAL HIGH (ref 0.1–1.0)
Monocytes Relative: 15 %
Neutro Abs: 7.5 10*3/uL (ref 1.7–7.7)
Neutrophils Relative %: 65 %
Platelets: 303 10*3/uL (ref 150–400)
RBC: 3.9 MIL/uL (ref 3.87–5.11)
RDW: 14.7 % (ref 11.5–15.5)
WBC: 11.3 10*3/uL — ABNORMAL HIGH (ref 4.0–10.5)
nRBC: 0 % (ref 0.0–0.2)

## 2019-03-07 LAB — GLUCOSE, CAPILLARY
Glucose-Capillary: 105 mg/dL — ABNORMAL HIGH (ref 70–99)
Glucose-Capillary: 121 mg/dL — ABNORMAL HIGH (ref 70–99)

## 2019-03-07 MED ORDER — DIPHENOXYLATE-ATROPINE 2.5-0.025 MG PO TABS: 1.0000 | ORAL_TABLET | Freq: Four times a day (QID) | ORAL | 0 refills | Status: AC | PRN

## 2019-03-07 MED ORDER — POLYETHYLENE GLYCOL 3350 17 G PO PACK: 17.0000 g | PACK | Freq: Every day | ORAL | 0 refills | Status: AC | PRN

## 2019-03-07 MED ORDER — LEVOTHYROXINE SODIUM 112 MCG PO TABS: 112.0000 ug | ORAL_TABLET | Freq: Every day | ORAL | 0 refills | Status: AC

## 2019-03-07 MED ORDER — BACLOFEN 10 MG PO TABS: 5.0000 mg | ORAL_TABLET | Freq: Two times a day (BID) | ORAL | 0 refills | Status: AC | PRN

## 2019-03-07 MED ORDER — OXYCODONE-ACETAMINOPHEN 5-325 MG PO TABS: 1.0000 | ORAL_TABLET | Freq: Four times a day (QID) | ORAL | 0 refills | Status: AC | PRN

## 2019-03-07 NOTE — Progress Notes (Signed)
Noted no urine output documented since last night.  Pt still dry. Bladder scan showed . Dr Enid Baas notified. Per MD In&out cath once.

## 2019-03-07 NOTE — Progress Notes (Signed)
Physical Therapy Treatment Patient Details Name: [name] MRN: [id number] DOB: December 17, 1929 Today's Date: 03/07/2019    History of Present Illness [name] is a 85 y.o. female brought to the ED from  house status post unwitnessed fall with facial hematoma.  Patient has a history of dementia, takes Eliquis for atrial fibrillation, coronary artery disease, diabetes, hypothyroidism and history of stroke.  Since admission patient has had a fall, and yesterday had some L knee swelling with nurse reporting negative Xrays    PT Comments    Patient is able to initiate LE movement to EOB to assist with supine > sit transfer, but is unable to complete transfer without assistance d/t lack of trunk control and pain in bilat LE with movement. Patient attempts to assist with STS transfer, but is unable to activate hip ext to prevent posterior lean and is +2 mod for physical assistance. Patient was agreeable for bed>chair transfer and did not resist transfer, but was not able to physically assist. Would benefit from skilled PT to address above deficits and promote optimal return to PLOF    Follow Up Recommendations  SNF     Equipment Recommendations  Rolling walker with 5" wheels;3in1 (PT);Wheelchair (measurements PT);Wheelchair cushion (measurements PT)    Recommendations for Other Services       Precautions / Restrictions Precautions Precautions: Fall Restrictions Weight Bearing Restrictions: No    Mobility  Bed Mobility Overal bed mobility: Needs Assistance Bed Mobility: Supine to Sit;Sit to Supine     Supine to sit: Mod assist Sit to supine: +2 for physical assistance   General bed mobility comments: assist for trunk and B LE's semi-supine to/from sit; vc's for technique  Transfers Overall transfer level: Needs assistance Equipment used: Rolling walker (2 wheeled) Transfers: Sit to/from [employer] Sit to Stand: Mod assist;Max assist;+2 physical  assistance Stand pivot transfers: Max assist       General transfer comment: tactile cues for LE placement; assist to initiate stand; able to stand 3/4th stand without full hip ext. Patient does not resist stand pivot and seems to understand technique, but is unable to physically assist  Ambulation/Gait         Gait velocity: not appropriate at this time       Stairs             Wheelchair Mobility    Modified Rankin (Stroke Patients Only)       Balance Overall balance assessment: Needs assistance Sitting-balance support: No upper extremity supported;Feet supported Sitting balance-Leahy Scale: Good Sitting balance - Comments: steady sitting reaching inside BOS     Standing balance-Leahy Scale: Zero                              Cognition Arousal/Alertness: Awake/alert   Overall Cognitive Status: No family/caregiver present to determine baseline cognitive functioning                                 General Comments: dementia      Exercises Other Exercises Other Exercises: Patient initiated LE movement to EOB with cuing for supine > sit, but is unable to control trunk to sit up without assistance +2 mod. Patient reports increased pain with LE movement.  Patient able to stand +2 max assist x2 trials with PT cuing to ext at hips and lift head, which patient is able  to minimally comply with. Patient is +1 max assist stand pivot bed>chair, but does seem to understand the technique and does not resist transfer. Education provided on rehab facility PT and how this will benefit her function.     General Comments        Pertinent Vitals/Pain Pain Assessment: Faces Pain Score: 6  Pain Location: R knee, L heel Pain Descriptors / Indicators: Aching    Home Living Family/patient expects to be discharged to:: Assisted living             Home Equipment: Dan Humphreys - 2 wheels Additional Comments: [place] [facility]    Prior Function Level of  Independence: Independent with assistive device(s)      Comments: Patient unable to give history d/t dementia. Mentions that she used a RW at [employer] and thinks she was getting from her room to dining room with it independently    PT Goals (current goals can now be found in the care plan section) Acute Rehab PT Goals Patient Stated Goal: Return to [facility] and walk with RW PT Goal Formulation: With patient Time For Goal Achievement: 03/17/19 Potential to Achieve Goals: Fair Progress towards PT goals: Progressing toward goals    Frequency    Min 2X/week      PT Plan      Co-evaluation              AM-PAC PT "6 Clicks" Mobility   Outcome Measure  Help needed turning from your back to your side while in a flat bed without using bedrails?: A Lot Help needed moving from lying on your back to sitting on the side of a flat bed without using bedrails?: A Lot Help needed moving to and from a bed to a chair (including a wheelchair)?: Total Help needed standing up from a chair using your arms (e.g., wheelchair or bedside chair)?: Total Help needed to walk in hospital room?: Total Help needed climbing 3-5 steps with a railing? : Total 6 Click Score: 8    End of Session Equipment Utilized During Treatment: Gait belt Activity Tolerance: Patient limited by pain Patient left: with call bell/phone within reach;Other (comment);in chair;with chair alarm set Nurse Communication: Mobility status PT Visit Diagnosis: Unsteadiness on feet (R26.81);Other abnormalities of gait and mobility (R26.89);Repeated falls (R29.6);Muscle weakness (generalized) (M62.81);History of falling (Z91.81);Difficulty in walking, not elsewhere classified (R26.2);Pain Pain - Right/Left: Right Pain - part of body: Knee     Time: 6060-0459 PT Time Calculation (min) (ACUTE ONLY): 24 min  Charges:  $Therapeutic Activity: 23-37 mins                    Staci Acosta PT, DPT   Staci Acosta 03/07/2019, 10:05  AM

## 2019-03-07 NOTE — TOC Transition Note (Signed)
Transition of Care [facility]) - CM/SW Discharge Note   Patient Details  Name: CEDRICA KELLEY MRN: [id number] Date of Birth: September 02, 1930  [facility] CM/SW Contact:  Ruthe Mannan, LCSWA Phone Number: 03/07/2019, 11:55 AM   Clinical Narrative:   Patient is medically ready for discharge today to [facility]. CSW notified patient's husband Elleanna Arrison [phone]. CSW also notified Inetta Fermo at [facility] of discharge today. Patient will be transported by EMS. RN to call report and call for transport.     Final next level of care: Skilled Nursing Facility Barriers to Discharge: No Barriers Identified   Patient Goals and CMS Choice Patient states their goals for this hospitalization and ongoing recovery are:: Husband would like patient to go to rehab and then hopefully return to Assisted living CMS Medicare.gov Compare Post Acute Care list provided to:: Patient Represenative (must comment)(Husband- Clarnce Flock ) Choice offered to / list presented to : Spouse  Discharge Placement              Patient chooses bed at: [facility] [place] Patient to be transferred to facility by: EMS Name of family member notified: Nakaylah Tirpak- husband  Patient and family notified of of transfer: 03/07/19  Discharge Plan and Services In-house Referral: Clinical Social Work Discharge Planning Services: NA Post Acute Care Choice: (unsure)          DME Arranged: N/A DME Agency: NA HH Arranged: NA HH Agency: NA   Social Determinants of Health (SDOH) Interventions     Readmission Risk Interventions No flowsheet data found.

## 2019-03-07 NOTE — Progress Notes (Signed)
Pt is being discharged to [facility]. Called report to [facility].  AVS and Rx placed in discharged packet. Awaiting EMS.

## 2019-03-07 NOTE — Discharge Summary (Signed)
[employer] [facility] [employer] at [facility]   PATIENT NAME: [name]    MR#:  [id number]  DATE OF BIRTH:  [birth year]  DATE OF ADMISSION:  02/28/2019   ADMITTING PHYSICIAN: Arnaldo Natal, MD  DATE OF DISCHARGE: 03/07/2019  PRIMARY CARE PHYSICIAN: Devoria Glassing, NP   ADMISSION DIAGNOSIS:  Lower urinary tract infectious disease [N39.0] Hematoma [T14.8XXA] Contusion of face, initial encounter [S00.83XA] Injury of head, initial encounter [S09.90XA] Fall, initial encounter [W19.XXXA] Sepsis, due to unspecified organism, unspecified whether acute organ dysfunction present (HCC) [A41.9] DISCHARGE DIAGNOSIS:  Active Problems:   Head trauma   UTI (urinary tract infection)  SECONDARY DIAGNOSIS:   Past Medical History:  Diagnosis Date  . A-fib (HCC)   . Anxiety   . Arthritis   . Bronchitis 02/2016  . Coronary artery disease 12/2007  . Diabetes mellitus    x 10 yrs at least  . History of stress test 08/2011   negative for ischemia it was nongated and nondiagnostic and compatible with a possible scar or peri-infraction ischemia.   Marland Kitchen Hx of echocardiogram 09/2012   showed Ef 35-40% with no significant valve disease except for mild to moderate MR. normal RVSP and left atrium was severly dilated.  . Hypertension   . Pacemaker   . Polio   . Stroke (HCC)   . Thyroid disease    hypothyroidism   HOSPITAL COURSE:  Chief Complaint: Fall HPI: The patient with past medical history of atrial fibrillation, coronary artery disease, diabetes, hypothyroidism and history of stroke presents to the emergency department following a fall.  CT of her head was normal.  However, the patient fell once again once she was here in our emergency department and underwent another CT of her head demonstrating large extracranial hematoma without evidence of fracture or intracranial bleed.  The patient appeared confused.  She was found to have a urinary tract infection.  Antibiotics were started  and the emergency department staff called the hospitalist service for admission.   Hospital course; Head traumawith large extracranial hematoma involving the left supraorbital area-CT head negative for intracranial bleed.  Noted some increase in the size of the extracranial hematoma in the left supraorbital area.  Eliquis already discontinued.  Repeat CT scan of the head done on 03/05/2019 revealed large left frontal scalp hematoma significantly larger compared to prior CT scan.  No acute intracranial pathology.   Dysphagia-noted to have some choking with eating breakfast previously  Seen by speech therapist.  Started on dysphagia 2 with thin liquids.  Tolerating well     E. ColiUTI-urine culture growing>100,000 CFU E. coli sensitive to ceftriaxone Patient was initially treated with IV Rocephin and subsequently transitioned to p.o. Keflex to complete treatment duration.  Leukocytosis resolved.  Repeat urinalysis negative  Chronic atrial fibrillation- rate-controlled -Continue digoxin and metoprolol Holding off on Eliquis for now due to evidence of some increase in the size of the extracranial hematoma .  Primary care physician to reevaluate after resolution of the extracranial hematoma for stability before consideration for resuming Eliquis.  Discussed case with husband who agrees with plans  Right knee swelling; significantly improved clinically. No tenderness on exam today.  Patient had recent fall prior to admission. Right knee x-ray done revealed soft tissue swelling about the anteromedial aspect of the knee with associated small knee joint effusion but without associated fracture or radiopaque body. Mild degenerative change involving the medial compartment of the knee. Uric acid level was normal at 4.0. Patient  seen by physical therapist.  Skilled nursing facility placement recommended  CAD-Stable, no active chest pain -ContinueImdur.  Diabetes mellitus type 2-stable  -SSI  Hyperlipidemia- stable -Continue statin therapy  Dementia-currently at mental status baseline -ContinueAricept, Namenda, Zyprexa and Remeron  Chronicsystolic congestive heart failure- last EF 35 to 40% April 2017.Does not appear volume overloaded. -Continue digoxin  Hypothyroidism-TSH 17 this admission, T4elevated -Synthroid increased to 112 mcg daily this admission -Needs thyroid function testing repeated as an outpatient   DISCHARGE CONDITIONS:  Stable CONSULTS OBTAINED:   DRUG ALLERGIES:   Allergies  Allergen Reactions  . Iohexol Other (See Comments)     Desc: CHEST PAIN, LOC AFTER HEART CATH, PT REFUSED DYE  Contrast Dye / Desc: CHEST PAIN, LOC AFTER HEART CATH, PT REFUSED DYE  . Penicillins Other (See Comments)    Has patient had a PCN reaction causing immediate rash, facial/tongue/throat swelling, SOB or lightheadedness with hypotension: No Has patient had a PCN reaction causing severe rash involving mucus membranes or skin necrosis: Unknown Has patient had a PCN reaction that required hospitalization: Unknown Has patient had a PCN reaction occurring within the last 10 years: Unknown If all of the above answers are "NO", then may proceed with Cephalosporin use.  Told not to take medication   . Sulfa Antibiotics Other (See Comments)    Told not to take med  . Sulfasalazine Other (See Comments)    Told not to take med  . Iodine Other (See Comments)   DISCHARGE MEDICATIONS:   Allergies as of 03/07/2019      Reactions   Iohexol Other (See Comments)    Desc: CHEST PAIN, LOC AFTER HEART CATH, PT REFUSED DYE Contrast Dye / Desc: CHEST PAIN, LOC AFTER HEART CATH, PT REFUSED DYE   Penicillins Other (See Comments)   Has patient had a PCN reaction causing immediate rash, facial/tongue/throat swelling, SOB or lightheadedness with hypotension: No Has patient had a PCN reaction causing severe rash involving mucus membranes or skin necrosis: Unknown Has  patient had a PCN reaction that required hospitalization: Unknown Has patient had a PCN reaction occurring within the last 10 years: Unknown If all of the above answers are "NO", then may proceed with Cephalosporin use. Told not to take medication   Sulfa Antibiotics Other (See Comments)   Told not to take med   Sulfasalazine Other (See Comments)   Told not to take med   Iodine Other (See Comments)      Medication List    STOP taking these medications   ALPRAZolam 0.25 MG tablet Commonly known as:  XANAX   ALPRAZolam 0.5 MG tablet Commonly known as:  XANAX   Eliquis 2.5 MG Tabs tablet Generic drug:  apixaban   guaiFENesin 100 MG/5ML liquid Commonly known as:  ROBITUSSIN     TAKE these medications   acetaminophen 500 MG tablet Commonly known as:  TYLENOL Take 500 mg by mouth every 4 (four) hours as needed.   alum & mag hydroxide-simeth 400-400-40 MG/5ML suspension Commonly known as:  MAALOX PLUS Take 15 mLs by mouth as needed for indigestion.   baclofen 10 MG tablet Commonly known as:  LIORESAL Take by mouth.   budesonide-formoterol 160-4.5 MCG/ACT inhaler Commonly known as:  SYMBICORT Inhale 2 puffs into the lungs 2 (two) times daily. What changed:  additional instructions   cetirizine 5 MG tablet Commonly known as:  ZYRTEC Take 5 mg by mouth at bedtime.   Combivent Respimat 20-100 MCG/ACT Aers respimat Generic drug:  Ipratropium-Albuterol  Inhale 1 puff into the lungs 3 (three) times daily. *Rinse mouth after each use*   digoxin 0.125 MG tablet Commonly known as:  LANOXIN Take 0.125 mg by mouth daily.   divalproex 125 MG capsule Commonly known as:  DEPAKOTE SPRINKLE 250 mg 2 (two) times daily.   donepezil 5 MG tablet Commonly known as:  ARICEPT Take 5 mg by mouth every evening.   isosorbide mononitrate 30 MG 24 hr tablet Commonly known as:  IMDUR Take 30 mg by mouth daily.   levothyroxine 112 MCG tablet Commonly known as:  SYNTHROID, LEVOTHROID  Take 1 tablet (112 mcg total) by mouth daily at 6 (six) AM. Start taking on:  March 08, 2019 What changed:    medication strength  how much to take  when to take this   Lomotil 2.5-0.025 MG tablet Generic drug:  diphenoxylate-atropine 1 tablets   loperamide 2 MG capsule Commonly known as:  IMODIUM Take 2 mg by mouth as needed. *Standing order* Take 1 capsule after a loose stool as needed and repeat with 1 capsule after each loose stool. MAX 8 CAPS PER DAY   magnesium hydroxide 400 MG/5ML suspension Commonly known as:  MILK OF MAGNESIA Take 30 mLs by mouth at bedtime as needed for mild constipation.   Melatonin 3 MG Tabs Take 1 tablet by mouth at bedtime.   memantine 10 MG tablet Commonly known as:  NAMENDA Take 10 mg by mouth 2 (two) times daily.   Metamucil Wafr as directed   metoprolol tartrate 25 MG tablet Commonly known as:  LOPRESSOR Take 25 mg by mouth 2 (two) times daily.   Mucinex DM 30-600 MG Tb12 Take 1 tablet by mouth every 12 (twelve) hours as needed.   neomycin-bacitracin-polymyxin [phone] ointment Apply 1 application topically as needed. For minor skin tears.   NONFORMULARY OR COMPOUNDED ITEM Estradiol 0.02 % 1ml prefilled applicator Sig: apply twice a week   OLANZapine 2.5 MG tablet Commonly known as:  ZYPREXA Take 2.5 mg by mouth at bedtime.   oxyCODONE-acetaminophen 5-325 MG tablet Commonly known as:  PERCOCET/ROXICET Take 1 tablet by mouth every 6 (six) hours as needed for moderate pain or severe pain.   polyethylene glycol packet Commonly known as:  MIRALAX / GLYCOLAX Take 17 g by mouth daily as needed for moderate constipation or severe constipation.   pravastatin 20 MG tablet Commonly known as:  PRAVACHOL Take 20 mg by mouth at bedtime.   Remeron 15 MG tablet Generic drug:  mirtazapine Take 15 mg by mouth at bedtime.   UNABLE TO FIND Take by mouth.   vitamin A & D ointment Apply topically.        DISCHARGE  INSTRUCTIONS:   DIET:  Cardiac diet and with dysphagia 2 with thin liquid consistency DISCHARGE CONDITION:  Stable ACTIVITY:  Out of bed with assistance OXYGEN:  Home Oxygen: No.  Oxygen Delivery: room air DISCHARGE LOCATION:  Skilled nursing facility  If you experience worsening of your admission symptoms, develop shortness of breath, life threatening emergency, suicidal or homicidal thoughts you must seek medical attention immediately by calling 911 or calling your MD immediately  if symptoms less severe.  You Must read complete instructions/literature along with all the possible adverse reactions/side effects for all the Medicines you take and that have been prescribed to you. Take any new Medicines after you have completely understood and accpet all the possible adverse reactions/side effects.   Please note  You were cared for by a hospitalist during  your hospital stay. If you have any questions about your discharge medications or the care you received while you were in the hospital after you are discharged, you can call the unit and asked to speak with the hospitalist on call if the hospitalist that took care of you is not available. Once you are discharged, your primary care physician will handle any further medical issues. Please note that NO REFILLS for any discharge medications will be authorized once you are discharged, as it is imperative that you return to your primary care physician (or establish a relationship with a primary care physician if you do not have one) for your aftercare needs so that they can reassess your need for medications and monitor your lab values.    On the day of Discharge:  VITAL SIGNS:  Blood pressure 131/69, pulse (!) 101, temperature 98 F (36.7 C), temperature source Oral, resp. rate 18, height 5\' 9"  (1.753 m), weight 75.8 kg, SpO2 96 %. PHYSICAL EXAMINATION:   Physical Exam  Constitutional: She appears well-developed and well-nourished.  HENT:   Head: Normocephalic and atraumatic.  Right Ear: External ear normal.  Eyes: Pupils are equal, round, and reactive to light. Conjunctivae are normal.  Ecchymosis and bruise in the right periorbital area and subcutaneous hematoma secondary to fall  Neck: Normal range of motion. Neck supple. No thyromegaly present.  Cardiovascular: Normal rate, regular rhythm and normal heart sounds.  Respiratory: Effort normal and breath sounds normal. No respiratory distress.  GI: Soft. Bowel sounds are normal. She exhibits no distension.  Musculoskeletal: Normal range of motion.        General: No edema.     Comments: Right knee swelling significantly improved.  No tenderness on exam today.   Neurological: She is alert. No cranial nerve deficit.  Pleasantly confused due to underlying dementia  Skin: Skin is warm. No pallor.  Psychiatric: She has a normal mood and affect. Her behavior is normal.   DATA REVIEW:   CBC Recent Labs  Lab 03/07/19 0342  WBC 11.3*  HGB 11.1*  HCT 36.4  PLT 303    Chemistries  Recent Labs  Lab 03/04/19 0434  NA 140  K 4.2  CL 102  CO2 31  GLUCOSE 122*  BUN 23  CREATININE 0.53  CALCIUM 8.1*  MG 2.0     Microbiology Results  Results for orders placed or performed during the hospital encounter of 02/28/19  Urine culture     Status: Abnormal   Collection Time: 03/01/19  1:00 AM  Result Value Ref Range Status   Specimen Description   Final    URINE, RANDOM Performed at [facility], [address], [place], [place] [address]    Special Requests   Final    NONE Performed at [facility], [address] [place], [place] [address]    Culture >=100,000 COLONIES/mL ESCHERICHIA COLI (A)  Final   Report Status 03/03/2019 FINAL  Final   Organism ID, Bacteria ESCHERICHIA COLI (A)  Final      Susceptibility   Escherichia coli - MIC*    AMPICILLIN >=32 RESISTANT Resistant     CEFAZOLIN 8 SENSITIVE Sensitive     CEFTRIAXONE <=1 SENSITIVE  Sensitive     CIPROFLOXACIN <=0.25 SENSITIVE Sensitive     GENTAMICIN <=1 SENSITIVE Sensitive     IMIPENEM <=0.25 SENSITIVE Sensitive     NITROFURANTOIN <=16 SENSITIVE Sensitive     TRIMETH/SULFA <=20 SENSITIVE Sensitive     AMPICILLIN/SULBACTAM >=32 RESISTANT Resistant  PIP/TAZO <=4 SENSITIVE Sensitive     Extended ESBL NEGATIVE Sensitive     * >=100,000 COLONIES/mL ESCHERICHIA COLI  Culture, blood (routine x 2)     Status: None   Collection Time: 03/01/19  4:00 AM  Result Value Ref Range Status   Specimen Description BLOOD LEFT ANTECUBITAL  Final   Special Requests   Final    BOTTLES DRAWN AEROBIC AND ANAEROBIC Blood Culture results may not be optimal due to an excessive volume of blood received in culture bottles   Culture   Final    NO GROWTH 5 DAYS Performed at [facility], [address] [place], [place] [address]    Report Status 03/06/2019 FINAL  Final  Culture, blood (routine x 2)     Status: None   Collection Time: 03/01/19  4:00 AM  Result Value Ref Range Status   Specimen Description BLOOD BLOOD LEFT WRIST  Final   Special Requests   Final    BOTTLES DRAWN AEROBIC AND ANAEROBIC Blood Culture adequate volume   Culture   Final    NO GROWTH 5 DAYS Performed at [facility], [address] [place], [place] [address]    Report Status 03/06/2019 FINAL  Final  MRSA PCR Screening     Status: None   Collection Time: 03/01/19  6:26 AM  Result Value Ref Range Status   MRSA by PCR NEGATIVE NEGATIVE Final    Comment:        The GeneXpert MRSA Assay (FDA approved for NASAL specimens only), is one component of a comprehensive MRSA colonization surveillance program. It is not intended to diagnose MRSA infection nor to guide or monitor treatment for MRSA infections. Performed at [facility], [address] [place], [place] [address]     RADIOLOGY:  No results found.   Management plans discussed with the patient, family and they  are in agreement.  CODE STATUS: DNR   TOTAL TIME TAKING CARE OF THIS PATIENT: 38 minutes.    Jandel Patriarca M.D on 03/07/2019 at 11:20 AM  Between 7am to 6pm - Pager - [phone]  After 6pm go to [email or web address] - Social research officer, government  [facility]  Office  [phone]  CC: Primary care physician; Devoria Glassing, NP   Note: This dictation was prepared with Dragon dictation along with smaller phrase technology. Any transcriptional errors that result from this process are unintentional.

## 2019-03-09 DIAGNOSIS — E119 Type 2 diabetes mellitus without complications: Secondary | ICD-10-CM | POA: Diagnosis not present

## 2019-03-09 DIAGNOSIS — E785 Hyperlipidemia, unspecified: Secondary | ICD-10-CM | POA: Diagnosis not present

## 2019-03-09 DIAGNOSIS — M7981 Nontraumatic hematoma of soft tissue: Secondary | ICD-10-CM | POA: Diagnosis not present

## 2019-03-09 DIAGNOSIS — I10 Essential (primary) hypertension: Secondary | ICD-10-CM | POA: Diagnosis not present

## 2019-03-09 DIAGNOSIS — N390 Urinary tract infection, site not specified: Secondary | ICD-10-CM | POA: Diagnosis not present

## 2019-03-09 DIAGNOSIS — I4891 Unspecified atrial fibrillation: Secondary | ICD-10-CM | POA: Diagnosis not present

## 2019-03-09 DIAGNOSIS — Z9181 History of falling: Secondary | ICD-10-CM | POA: Diagnosis not present

## 2019-03-09 DIAGNOSIS — E039 Hypothyroidism, unspecified: Secondary | ICD-10-CM | POA: Diagnosis not present

## 2019-03-09 DIAGNOSIS — F0390 Unspecified dementia without behavioral disturbance: Secondary | ICD-10-CM | POA: Diagnosis not present

## 2019-03-12 ENCOUNTER — Telehealth: Payer: Self-pay

## 2019-03-12 NOTE — Telephone Encounter (Signed)
lmtcb

## 2019-03-13 NOTE — Telephone Encounter (Signed)
Spoke to pt husband concerning appointment on 4/15 with Dr. Rennis Golden. Pt husband stated pt was in a rehab facility now from a recent fall and was in the hospital before that. Pt husband stated they do have an Android tablet to do a video visit, but thought it would be better to wait until pt is out of facility and feeling better. Rescheduled appointment to 04/12/19 at 1:30 pm with Dr. Rennis Golden.

## 2019-03-15 ENCOUNTER — Ambulatory Visit: Payer: Medicare HMO | Admitting: Internal Medicine

## 2019-03-15 DIAGNOSIS — I4891 Unspecified atrial fibrillation: Secondary | ICD-10-CM | POA: Diagnosis not present

## 2019-03-15 DIAGNOSIS — E039 Hypothyroidism, unspecified: Secondary | ICD-10-CM | POA: Diagnosis not present

## 2019-03-15 DIAGNOSIS — E785 Hyperlipidemia, unspecified: Secondary | ICD-10-CM | POA: Diagnosis not present

## 2019-03-15 DIAGNOSIS — M7981 Nontraumatic hematoma of soft tissue: Secondary | ICD-10-CM | POA: Diagnosis not present

## 2019-03-15 DIAGNOSIS — I10 Essential (primary) hypertension: Secondary | ICD-10-CM | POA: Diagnosis not present

## 2019-03-15 DIAGNOSIS — F0390 Unspecified dementia without behavioral disturbance: Secondary | ICD-10-CM | POA: Diagnosis not present

## 2019-03-23 DIAGNOSIS — E039 Hypothyroidism, unspecified: Secondary | ICD-10-CM | POA: Diagnosis not present

## 2019-03-23 DIAGNOSIS — E785 Hyperlipidemia, unspecified: Secondary | ICD-10-CM | POA: Diagnosis not present

## 2019-03-23 DIAGNOSIS — M7981 Nontraumatic hematoma of soft tissue: Secondary | ICD-10-CM | POA: Diagnosis not present

## 2019-03-23 DIAGNOSIS — I4891 Unspecified atrial fibrillation: Secondary | ICD-10-CM | POA: Diagnosis not present

## 2019-03-23 DIAGNOSIS — I10 Essential (primary) hypertension: Secondary | ICD-10-CM | POA: Diagnosis not present

## 2019-03-23 DIAGNOSIS — F0390 Unspecified dementia without behavioral disturbance: Secondary | ICD-10-CM | POA: Diagnosis not present

## 2019-04-02 DIAGNOSIS — L97309 Non-pressure chronic ulcer of unspecified ankle with unspecified severity: Secondary | ICD-10-CM | POA: Diagnosis not present

## 2019-04-02 DIAGNOSIS — I4811 Longstanding persistent atrial fibrillation: Secondary | ICD-10-CM | POA: Diagnosis not present

## 2019-04-02 DIAGNOSIS — G309 Alzheimer's disease, unspecified: Secondary | ICD-10-CM | POA: Diagnosis not present

## 2019-04-02 DIAGNOSIS — S31000A Unspecified open wound of lower back and pelvis without penetration into retroperitoneum, initial encounter: Secondary | ICD-10-CM | POA: Diagnosis not present

## 2019-04-02 DIAGNOSIS — I10 Essential (primary) hypertension: Secondary | ICD-10-CM | POA: Diagnosis not present

## 2019-04-04 DIAGNOSIS — I4811 Longstanding persistent atrial fibrillation: Secondary | ICD-10-CM | POA: Diagnosis not present

## 2019-04-04 DIAGNOSIS — L89152 Pressure ulcer of sacral region, stage 2: Secondary | ICD-10-CM | POA: Diagnosis not present

## 2019-04-04 DIAGNOSIS — L89600 Pressure ulcer of unspecified heel, unstageable: Secondary | ICD-10-CM | POA: Diagnosis not present

## 2019-04-04 DIAGNOSIS — L89620 Pressure ulcer of left heel, unstageable: Secondary | ICD-10-CM | POA: Diagnosis not present

## 2019-04-04 DIAGNOSIS — I4891 Unspecified atrial fibrillation: Secondary | ICD-10-CM | POA: Diagnosis not present

## 2019-04-04 DIAGNOSIS — F0390 Unspecified dementia without behavioral disturbance: Secondary | ICD-10-CM | POA: Diagnosis not present

## 2019-04-04 DIAGNOSIS — I10 Essential (primary) hypertension: Secondary | ICD-10-CM | POA: Diagnosis not present

## 2019-04-04 DIAGNOSIS — E119 Type 2 diabetes mellitus without complications: Secondary | ICD-10-CM | POA: Diagnosis not present

## 2019-04-04 DIAGNOSIS — J449 Chronic obstructive pulmonary disease, unspecified: Secondary | ICD-10-CM | POA: Diagnosis not present

## 2019-04-04 DIAGNOSIS — I2510 Atherosclerotic heart disease of native coronary artery without angina pectoris: Secondary | ICD-10-CM | POA: Diagnosis not present

## 2019-04-04 DIAGNOSIS — I509 Heart failure, unspecified: Secondary | ICD-10-CM | POA: Diagnosis not present

## 2019-04-04 DIAGNOSIS — L89610 Pressure ulcer of right heel, unstageable: Secondary | ICD-10-CM | POA: Diagnosis not present

## 2019-04-04 DIAGNOSIS — S0081XD Abrasion of other part of head, subsequent encounter: Secondary | ICD-10-CM | POA: Diagnosis not present

## 2019-04-04 DIAGNOSIS — R52 Pain, unspecified: Secondary | ICD-10-CM | POA: Diagnosis not present

## 2019-04-04 DIAGNOSIS — I110 Hypertensive heart disease with heart failure: Secondary | ICD-10-CM | POA: Diagnosis not present

## 2019-04-05 DIAGNOSIS — F419 Anxiety disorder, unspecified: Secondary | ICD-10-CM | POA: Diagnosis not present

## 2019-04-06 ENCOUNTER — Telehealth: Payer: Self-pay | Admitting: Cardiology

## 2019-04-06 ENCOUNTER — Telehealth: Payer: Self-pay | Admitting: Student

## 2019-04-06 DIAGNOSIS — I509 Heart failure, unspecified: Secondary | ICD-10-CM | POA: Diagnosis not present

## 2019-04-06 DIAGNOSIS — I110 Hypertensive heart disease with heart failure: Secondary | ICD-10-CM | POA: Diagnosis not present

## 2019-04-06 DIAGNOSIS — J449 Chronic obstructive pulmonary disease, unspecified: Secondary | ICD-10-CM | POA: Diagnosis not present

## 2019-04-06 DIAGNOSIS — L89610 Pressure ulcer of right heel, unstageable: Secondary | ICD-10-CM | POA: Diagnosis not present

## 2019-04-06 DIAGNOSIS — E119 Type 2 diabetes mellitus without complications: Secondary | ICD-10-CM | POA: Diagnosis not present

## 2019-04-06 DIAGNOSIS — I2510 Atherosclerotic heart disease of native coronary artery without angina pectoris: Secondary | ICD-10-CM | POA: Diagnosis not present

## 2019-04-06 DIAGNOSIS — L89620 Pressure ulcer of left heel, unstageable: Secondary | ICD-10-CM | POA: Diagnosis not present

## 2019-04-06 DIAGNOSIS — L89152 Pressure ulcer of sacral region, stage 2: Secondary | ICD-10-CM | POA: Diagnosis not present

## 2019-04-06 DIAGNOSIS — I4891 Unspecified atrial fibrillation: Secondary | ICD-10-CM | POA: Diagnosis not present

## 2019-04-06 NOTE — Telephone Encounter (Signed)
Spoke w/ pt husband. He stated that there has been a lot going on. He plans to send a transmission either today or next week.

## 2019-04-06 NOTE — Telephone Encounter (Signed)
Palliative NP left message for Mr. Jemerson to discuss patient. She was not admitted to hospice services at this time due to hospice not having CNA services at present due to Covid-19. Per SW, she is going to receive [facility].

## 2019-04-07 DIAGNOSIS — L89610 Pressure ulcer of right heel, unstageable: Secondary | ICD-10-CM | POA: Diagnosis not present

## 2019-04-07 DIAGNOSIS — L89152 Pressure ulcer of sacral region, stage 2: Secondary | ICD-10-CM | POA: Diagnosis not present

## 2019-04-07 DIAGNOSIS — L89620 Pressure ulcer of left heel, unstageable: Secondary | ICD-10-CM | POA: Diagnosis not present

## 2019-04-09 DIAGNOSIS — L89620 Pressure ulcer of left heel, unstageable: Secondary | ICD-10-CM | POA: Diagnosis not present

## 2019-04-09 DIAGNOSIS — L89610 Pressure ulcer of right heel, unstageable: Secondary | ICD-10-CM | POA: Diagnosis not present

## 2019-04-09 DIAGNOSIS — E119 Type 2 diabetes mellitus without complications: Secondary | ICD-10-CM | POA: Diagnosis not present

## 2019-04-09 DIAGNOSIS — I4891 Unspecified atrial fibrillation: Secondary | ICD-10-CM | POA: Diagnosis not present

## 2019-04-09 DIAGNOSIS — I110 Hypertensive heart disease with heart failure: Secondary | ICD-10-CM | POA: Diagnosis not present

## 2019-04-09 DIAGNOSIS — I509 Heart failure, unspecified: Secondary | ICD-10-CM | POA: Diagnosis not present

## 2019-04-09 DIAGNOSIS — L89152 Pressure ulcer of sacral region, stage 2: Secondary | ICD-10-CM | POA: Diagnosis not present

## 2019-04-09 DIAGNOSIS — J449 Chronic obstructive pulmonary disease, unspecified: Secondary | ICD-10-CM | POA: Diagnosis not present

## 2019-04-09 DIAGNOSIS — I2510 Atherosclerotic heart disease of native coronary artery without angina pectoris: Secondary | ICD-10-CM | POA: Diagnosis not present

## 2019-04-10 DIAGNOSIS — I2510 Atherosclerotic heart disease of native coronary artery without angina pectoris: Secondary | ICD-10-CM | POA: Diagnosis not present

## 2019-04-10 DIAGNOSIS — L89610 Pressure ulcer of right heel, unstageable: Secondary | ICD-10-CM | POA: Diagnosis not present

## 2019-04-10 DIAGNOSIS — I4891 Unspecified atrial fibrillation: Secondary | ICD-10-CM | POA: Diagnosis not present

## 2019-04-10 DIAGNOSIS — E119 Type 2 diabetes mellitus without complications: Secondary | ICD-10-CM | POA: Diagnosis not present

## 2019-04-10 DIAGNOSIS — I509 Heart failure, unspecified: Secondary | ICD-10-CM | POA: Diagnosis not present

## 2019-04-10 DIAGNOSIS — L89620 Pressure ulcer of left heel, unstageable: Secondary | ICD-10-CM | POA: Diagnosis not present

## 2019-04-10 DIAGNOSIS — I110 Hypertensive heart disease with heart failure: Secondary | ICD-10-CM | POA: Diagnosis not present

## 2019-04-10 DIAGNOSIS — F4322 Adjustment disorder with anxiety: Secondary | ICD-10-CM | POA: Diagnosis not present

## 2019-04-10 DIAGNOSIS — G309 Alzheimer's disease, unspecified: Secondary | ICD-10-CM | POA: Diagnosis not present

## 2019-04-10 DIAGNOSIS — J449 Chronic obstructive pulmonary disease, unspecified: Secondary | ICD-10-CM | POA: Diagnosis not present

## 2019-04-10 DIAGNOSIS — F418 Other specified anxiety disorders: Secondary | ICD-10-CM | POA: Diagnosis not present

## 2019-04-10 DIAGNOSIS — F321 Major depressive disorder, single episode, moderate: Secondary | ICD-10-CM | POA: Diagnosis not present

## 2019-04-10 DIAGNOSIS — L89152 Pressure ulcer of sacral region, stage 2: Secondary | ICD-10-CM | POA: Diagnosis not present

## 2019-04-11 ENCOUNTER — Telehealth: Payer: Self-pay | Admitting: Internal Medicine

## 2019-04-11 DIAGNOSIS — J449 Chronic obstructive pulmonary disease, unspecified: Secondary | ICD-10-CM | POA: Diagnosis not present

## 2019-04-11 DIAGNOSIS — I2510 Atherosclerotic heart disease of native coronary artery without angina pectoris: Secondary | ICD-10-CM | POA: Diagnosis not present

## 2019-04-11 DIAGNOSIS — L89152 Pressure ulcer of sacral region, stage 2: Secondary | ICD-10-CM | POA: Diagnosis not present

## 2019-04-11 DIAGNOSIS — E119 Type 2 diabetes mellitus without complications: Secondary | ICD-10-CM | POA: Diagnosis not present

## 2019-04-11 DIAGNOSIS — L89610 Pressure ulcer of right heel, unstageable: Secondary | ICD-10-CM | POA: Diagnosis not present

## 2019-04-11 DIAGNOSIS — I509 Heart failure, unspecified: Secondary | ICD-10-CM | POA: Diagnosis not present

## 2019-04-11 DIAGNOSIS — I110 Hypertensive heart disease with heart failure: Secondary | ICD-10-CM | POA: Diagnosis not present

## 2019-04-11 DIAGNOSIS — I4891 Unspecified atrial fibrillation: Secondary | ICD-10-CM | POA: Diagnosis not present

## 2019-04-11 DIAGNOSIS — L89620 Pressure ulcer of left heel, unstageable: Secondary | ICD-10-CM | POA: Diagnosis not present

## 2019-04-12 ENCOUNTER — Telehealth: Payer: Self-pay

## 2019-04-12 ENCOUNTER — Telehealth: Payer: Medicare HMO | Admitting: Internal Medicine

## 2019-04-12 NOTE — Telephone Encounter (Signed)
Spoke with [name] to prechart the visit, and he informed me that he can not get to his wife due to covid 35. He would like to cancel this appointment for May 14th.

## 2019-04-13 ENCOUNTER — Telehealth: Payer: Self-pay | Admitting: Student

## 2019-04-13 DIAGNOSIS — I509 Heart failure, unspecified: Secondary | ICD-10-CM | POA: Diagnosis not present

## 2019-04-13 DIAGNOSIS — L89152 Pressure ulcer of sacral region, stage 2: Secondary | ICD-10-CM | POA: Diagnosis not present

## 2019-04-13 DIAGNOSIS — L89620 Pressure ulcer of left heel, unstageable: Secondary | ICD-10-CM | POA: Diagnosis not present

## 2019-04-13 DIAGNOSIS — J449 Chronic obstructive pulmonary disease, unspecified: Secondary | ICD-10-CM | POA: Diagnosis not present

## 2019-04-13 DIAGNOSIS — I110 Hypertensive heart disease with heart failure: Secondary | ICD-10-CM | POA: Diagnosis not present

## 2019-04-13 DIAGNOSIS — I2510 Atherosclerotic heart disease of native coronary artery without angina pectoris: Secondary | ICD-10-CM | POA: Diagnosis not present

## 2019-04-13 DIAGNOSIS — I4891 Unspecified atrial fibrillation: Secondary | ICD-10-CM | POA: Diagnosis not present

## 2019-04-13 DIAGNOSIS — L89610 Pressure ulcer of right heel, unstageable: Secondary | ICD-10-CM | POA: Diagnosis not present

## 2019-04-13 DIAGNOSIS — E119 Type 2 diabetes mellitus without complications: Secondary | ICD-10-CM | POA: Diagnosis not present

## 2019-04-13 NOTE — Telephone Encounter (Signed)
Palliative NP spoke with [name] regarding patient. He was made aware that NP had arrived to facility yesterday to see patient and administrator stated patient was now on quarantine due to being tested for Covid-19 and they were awaiting results. NP did not see patient; visit to be rescheduled. Patient was tested on 5/13 and results should be available within 48 hours. He states patient has not been eating well, she has been doing well with therapy up until yesterday as therapy could not work with her due to patient being quarantined.

## 2019-04-16 DIAGNOSIS — I110 Hypertensive heart disease with heart failure: Secondary | ICD-10-CM | POA: Diagnosis not present

## 2019-04-16 DIAGNOSIS — J449 Chronic obstructive pulmonary disease, unspecified: Secondary | ICD-10-CM | POA: Diagnosis not present

## 2019-04-16 DIAGNOSIS — L89150 Pressure ulcer of sacral region, unstageable: Secondary | ICD-10-CM | POA: Diagnosis not present

## 2019-04-16 DIAGNOSIS — I509 Heart failure, unspecified: Secondary | ICD-10-CM | POA: Diagnosis not present

## 2019-04-16 DIAGNOSIS — G309 Alzheimer's disease, unspecified: Secondary | ICD-10-CM | POA: Diagnosis not present

## 2019-04-16 DIAGNOSIS — I4811 Longstanding persistent atrial fibrillation: Secondary | ICD-10-CM | POA: Diagnosis not present

## 2019-04-16 DIAGNOSIS — L89610 Pressure ulcer of right heel, unstageable: Secondary | ICD-10-CM | POA: Diagnosis not present

## 2019-04-16 DIAGNOSIS — R159 Full incontinence of feces: Secondary | ICD-10-CM | POA: Diagnosis not present

## 2019-04-16 DIAGNOSIS — F0281 Dementia in other diseases classified elsewhere with behavioral disturbance: Secondary | ICD-10-CM | POA: Diagnosis not present

## 2019-04-16 DIAGNOSIS — R269 Unspecified abnormalities of gait and mobility: Secondary | ICD-10-CM | POA: Diagnosis not present

## 2019-04-16 DIAGNOSIS — I4891 Unspecified atrial fibrillation: Secondary | ICD-10-CM | POA: Diagnosis not present

## 2019-04-16 DIAGNOSIS — L89600 Pressure ulcer of unspecified heel, unstageable: Secondary | ICD-10-CM | POA: Diagnosis not present

## 2019-04-16 DIAGNOSIS — I2510 Atherosclerotic heart disease of native coronary artery without angina pectoris: Secondary | ICD-10-CM | POA: Diagnosis not present

## 2019-04-16 DIAGNOSIS — L89620 Pressure ulcer of left heel, unstageable: Secondary | ICD-10-CM | POA: Diagnosis not present

## 2019-04-16 DIAGNOSIS — E119 Type 2 diabetes mellitus without complications: Secondary | ICD-10-CM | POA: Diagnosis not present

## 2019-04-16 DIAGNOSIS — R32 Unspecified urinary incontinence: Secondary | ICD-10-CM | POA: Diagnosis not present

## 2019-04-16 DIAGNOSIS — L89152 Pressure ulcer of sacral region, stage 2: Secondary | ICD-10-CM | POA: Diagnosis not present

## 2019-04-17 DIAGNOSIS — I10 Essential (primary) hypertension: Secondary | ICD-10-CM | POA: Diagnosis not present

## 2019-04-17 DIAGNOSIS — L89610 Pressure ulcer of right heel, unstageable: Secondary | ICD-10-CM | POA: Diagnosis not present

## 2019-04-17 DIAGNOSIS — E119 Type 2 diabetes mellitus without complications: Secondary | ICD-10-CM | POA: Diagnosis not present

## 2019-04-17 DIAGNOSIS — I2510 Atherosclerotic heart disease of native coronary artery without angina pectoris: Secondary | ICD-10-CM | POA: Diagnosis not present

## 2019-04-17 DIAGNOSIS — L89152 Pressure ulcer of sacral region, stage 2: Secondary | ICD-10-CM | POA: Diagnosis not present

## 2019-04-17 DIAGNOSIS — L03317 Cellulitis of buttock: Secondary | ICD-10-CM | POA: Diagnosis not present

## 2019-04-17 DIAGNOSIS — J410 Simple chronic bronchitis: Secondary | ICD-10-CM | POA: Diagnosis not present

## 2019-04-17 DIAGNOSIS — G309 Alzheimer's disease, unspecified: Secondary | ICD-10-CM | POA: Diagnosis not present

## 2019-04-17 DIAGNOSIS — I509 Heart failure, unspecified: Secondary | ICD-10-CM | POA: Diagnosis not present

## 2019-04-17 DIAGNOSIS — L89620 Pressure ulcer of left heel, unstageable: Secondary | ICD-10-CM | POA: Diagnosis not present

## 2019-04-17 DIAGNOSIS — J449 Chronic obstructive pulmonary disease, unspecified: Secondary | ICD-10-CM | POA: Diagnosis not present

## 2019-04-17 DIAGNOSIS — I4891 Unspecified atrial fibrillation: Secondary | ICD-10-CM | POA: Diagnosis not present

## 2019-04-17 DIAGNOSIS — I110 Hypertensive heart disease with heart failure: Secondary | ICD-10-CM | POA: Diagnosis not present

## 2019-04-18 DIAGNOSIS — R627 Adult failure to thrive: Secondary | ICD-10-CM | POA: Diagnosis not present

## 2019-04-18 DIAGNOSIS — F0390 Unspecified dementia without behavioral disturbance: Secondary | ICD-10-CM | POA: Diagnosis not present

## 2019-04-18 DIAGNOSIS — I110 Hypertensive heart disease with heart failure: Secondary | ICD-10-CM | POA: Diagnosis not present

## 2019-04-18 DIAGNOSIS — I2510 Atherosclerotic heart disease of native coronary artery without angina pectoris: Secondary | ICD-10-CM | POA: Diagnosis not present

## 2019-04-18 DIAGNOSIS — L89620 Pressure ulcer of left heel, unstageable: Secondary | ICD-10-CM | POA: Diagnosis not present

## 2019-04-18 DIAGNOSIS — L89152 Pressure ulcer of sacral region, stage 2: Secondary | ICD-10-CM | POA: Diagnosis not present

## 2019-04-18 DIAGNOSIS — L89150 Pressure ulcer of sacral region, unstageable: Secondary | ICD-10-CM | POA: Diagnosis not present

## 2019-04-18 DIAGNOSIS — L89610 Pressure ulcer of right heel, unstageable: Secondary | ICD-10-CM | POA: Diagnosis not present

## 2019-04-18 DIAGNOSIS — L89600 Pressure ulcer of unspecified heel, unstageable: Secondary | ICD-10-CM | POA: Diagnosis not present

## 2019-04-18 DIAGNOSIS — I4891 Unspecified atrial fibrillation: Secondary | ICD-10-CM | POA: Diagnosis not present

## 2019-04-18 DIAGNOSIS — I4811 Longstanding persistent atrial fibrillation: Secondary | ICD-10-CM | POA: Diagnosis not present

## 2019-04-18 DIAGNOSIS — E119 Type 2 diabetes mellitus without complications: Secondary | ICD-10-CM | POA: Diagnosis not present

## 2019-04-18 DIAGNOSIS — J449 Chronic obstructive pulmonary disease, unspecified: Secondary | ICD-10-CM | POA: Diagnosis not present

## 2019-04-18 DIAGNOSIS — I509 Heart failure, unspecified: Secondary | ICD-10-CM | POA: Diagnosis not present

## 2019-04-25 DIAGNOSIS — I4811 Longstanding persistent atrial fibrillation: Secondary | ICD-10-CM | POA: Diagnosis not present

## 2019-04-25 DIAGNOSIS — L89600 Pressure ulcer of unspecified heel, unstageable: Secondary | ICD-10-CM | POA: Diagnosis not present

## 2019-04-25 DIAGNOSIS — L89150 Pressure ulcer of sacral region, unstageable: Secondary | ICD-10-CM | POA: Diagnosis not present

## 2019-04-25 DIAGNOSIS — J449 Chronic obstructive pulmonary disease, unspecified: Secondary | ICD-10-CM | POA: Diagnosis not present

## 2019-04-30 DIAGNOSIS — G309 Alzheimer's disease, unspecified: Secondary | ICD-10-CM | POA: Diagnosis not present

## 2019-04-30 DIAGNOSIS — J410 Simple chronic bronchitis: Secondary | ICD-10-CM | POA: Diagnosis not present

## 2019-05-01 DIAGNOSIS — G309 Alzheimer's disease, unspecified: Secondary | ICD-10-CM | POA: Diagnosis not present

## 2019-05-02 DIAGNOSIS — G309 Alzheimer's disease, unspecified: Secondary | ICD-10-CM | POA: Diagnosis not present

## 2019-05-03 DIAGNOSIS — G309 Alzheimer's disease, unspecified: Secondary | ICD-10-CM | POA: Diagnosis not present

## 2019-05-04 DIAGNOSIS — G309 Alzheimer's disease, unspecified: Secondary | ICD-10-CM | POA: Diagnosis not present

## 2019-05-04 DIAGNOSIS — F419 Anxiety disorder, unspecified: Secondary | ICD-10-CM | POA: Diagnosis not present

## 2019-05-05 DIAGNOSIS — G309 Alzheimer's disease, unspecified: Secondary | ICD-10-CM | POA: Diagnosis not present

## 2019-05-07 ENCOUNTER — Telehealth: Payer: Self-pay | Admitting: *Deleted

## 2019-05-07 ENCOUNTER — Ambulatory Visit (INDEPENDENT_AMBULATORY_CARE_PROVIDER_SITE_OTHER): Payer: Medicare HMO | Admitting: *Deleted

## 2019-05-07 DIAGNOSIS — I495 Sick sinus syndrome: Secondary | ICD-10-CM

## 2019-05-07 DIAGNOSIS — I482 Chronic atrial fibrillation, unspecified: Secondary | ICD-10-CM

## 2019-05-07 LAB — CUP PACEART REMOTE DEVICE CHECK
Battery Impedance: 4733 Ohm
Battery Remaining Longevity: 11 mo
Battery Voltage: 2.68 V
Brady Statistic RV Percent Paced: 48 %
Date Time Interrogation Session: 20200606155150
Implantable Lead Implant Date: 20111017
Implantable Lead Location: 753860
Implantable Lead Model: 5076
Implantable Pulse Generator Implant Date: 20111017
Lead Channel Impedance Value: 0 Ohm
Lead Channel Impedance Value: 580 Ohm
Lead Channel Pacing Threshold Amplitude: 0.375 V
Lead Channel Pacing Threshold Pulse Width: 0.4 ms
Lead Channel Setting Pacing Amplitude: 2.5 V
Lead Channel Setting Pacing Pulse Width: 0.4 ms
Lead Channel Setting Sensing Sensitivity: 2 mV

## 2019-05-07 NOTE — Telephone Encounter (Signed)
Wow. That is so unusual - neither myself nor Dr. Claiborne Billings have seen her since 2017 and this is the only download in last 2 years. Just as we were re-establishing follow-up, she passed away. Sorry to hear that, my condolences to the family if you speak with them again.

## 2019-05-07 NOTE — Telephone Encounter (Signed)
Estimated remaining battery longevity as of scheduled transmission is 11 months (range <1-23 months). Will plan to start monthly battery checks via [employer] monitor.   LMOVM for Mr. Hard requesting call back to [place]. Per FYI in chart, pt is a resident at [facility]. Spoke with patient's nurse, Arbie Cookey, who reports that pt expired yesterday. She is unsure if pt's husband has picked up her belongings yet. [facility] phone number in case they need a monitor return kit. Arbie Cookey denies any questions at this time.  Confirmed with Crystal, RN with [facility] that pt expired on . Unenrolled from [facility] notes updated.

## 2019-05-08 NOTE — Telephone Encounter (Signed)
Please send my condolences to the family as well

## 2019-05-14 NOTE — Progress Notes (Signed)
Remote pacemaker transmission.   

## 2019-05-21 ENCOUNTER — Ambulatory Visit: Payer: Medicare HMO | Admitting: Podiatry

## 2019-05-29 NOTE — Telephone Encounter (Signed)
Opened in error

## 2019-05-30 DEATH — deceased

## 2019-08-07 ENCOUNTER — Encounter: Payer: Medicare HMO | Admitting: *Deleted

## 2019-09-06 ENCOUNTER — Encounter: Payer: Self-pay | Admitting: Gynecology

## 2020-05-02 IMAGING — CT CT HEAD WITHOUT CONTRAST
5 series · 17 of 47 positions shown, 19 images · non-contrast
Comparison: 02/28/2019

CLINICAL DATA: Fall in room, increased frontal swelling

EXAM:
CT HEAD WITHOUT CONTRAST
TECHNIQUE: Contiguous axial images were obtained from the base of the skull
through the vertex without intravenous contrast.

[Series 3: head wo · axial · 0.44mm/px · z∈[-65,+25]mm · 4 of 31 slices shown]
[im 7/31  brain]
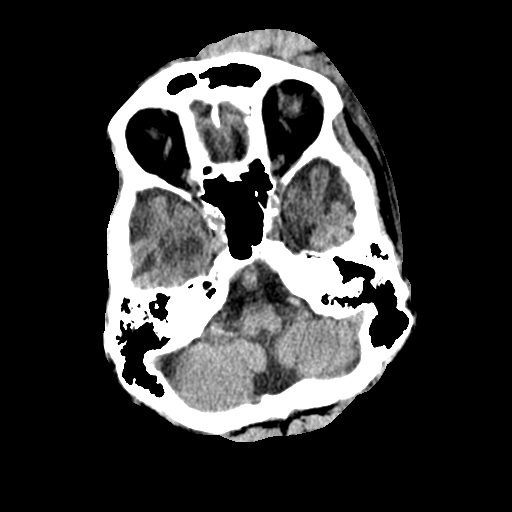
[im 13/31  brain]
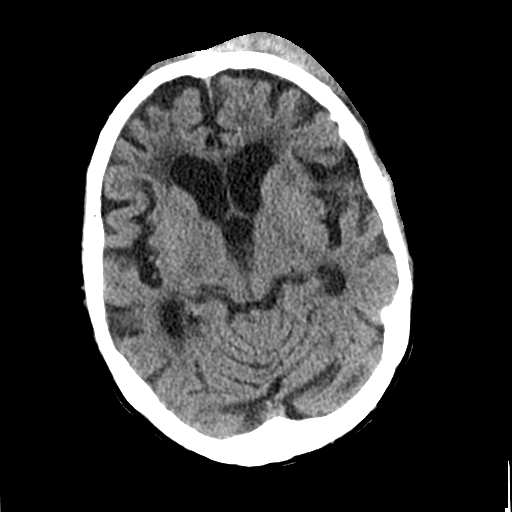
[im 19/31  brain]
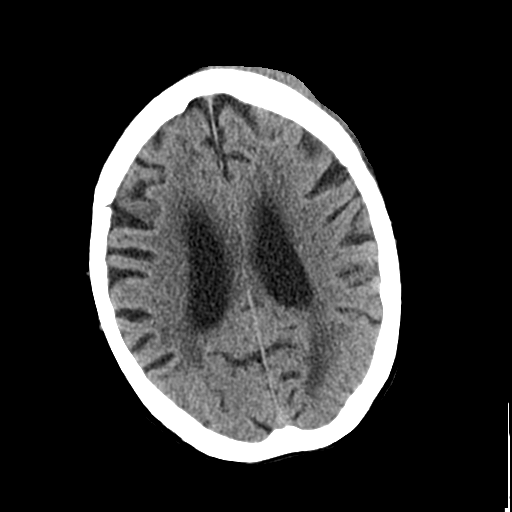
[im 25/31  brain]
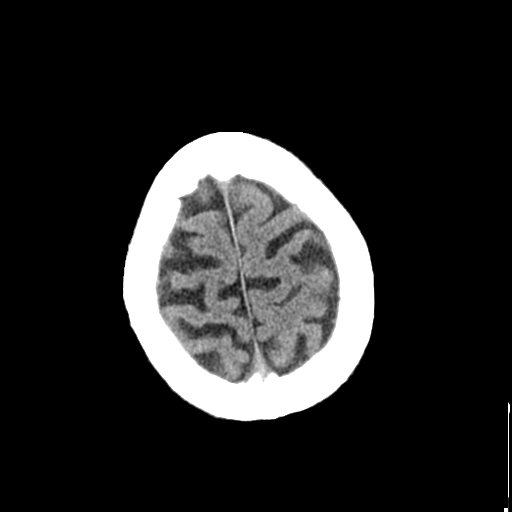

[Series 4: coronal soft tissue · coronal · 0.33mm/px · 3 of 66 slices shown]
[im 22/66  brain]
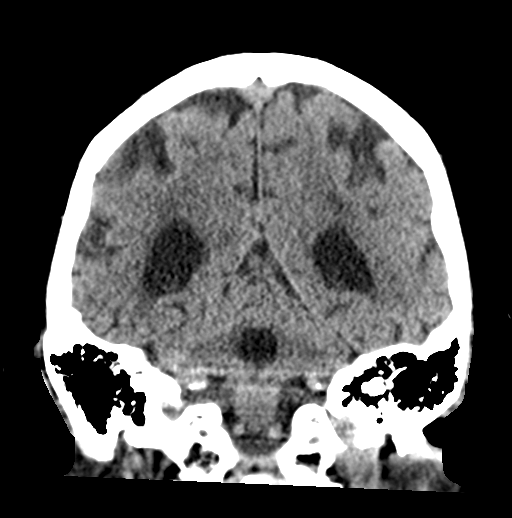
[im 29/66  brain]
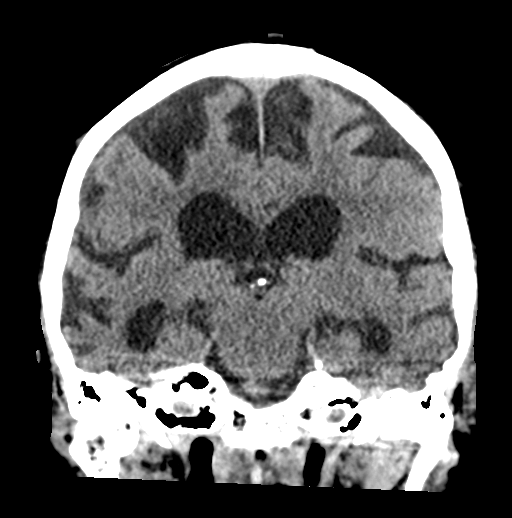
[im 37/66  brain]
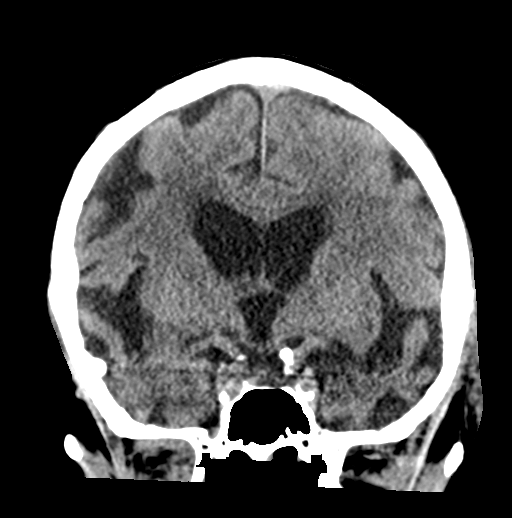

[Series 5: sagittal soft tissue · sagittal · 0.33mm/px · 3 of 50 slices shown]
[im 17/50  brain]
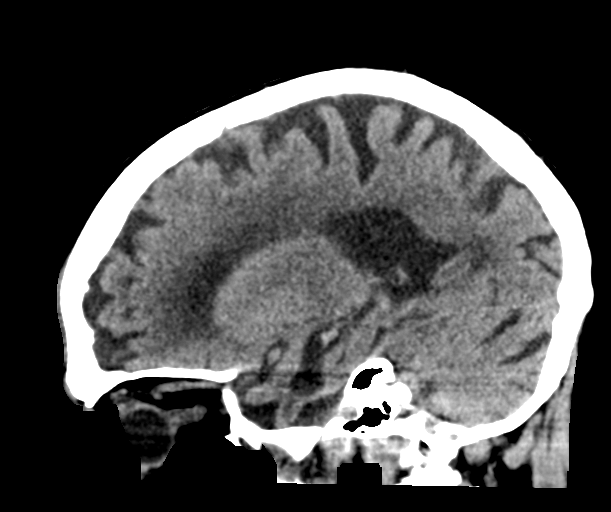
[im 25/50  brain]
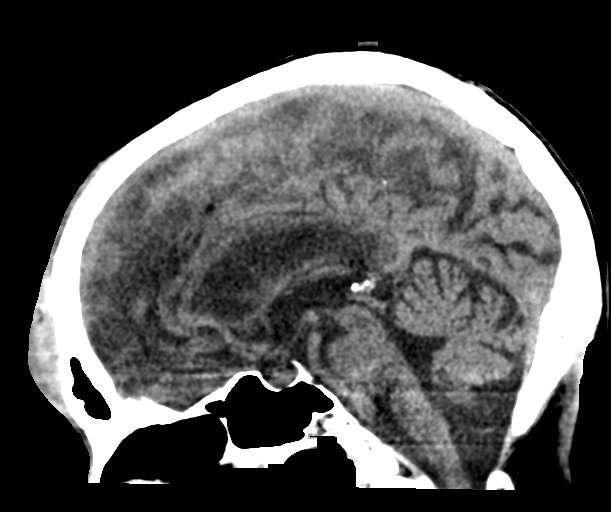
[im 33/50  brain]
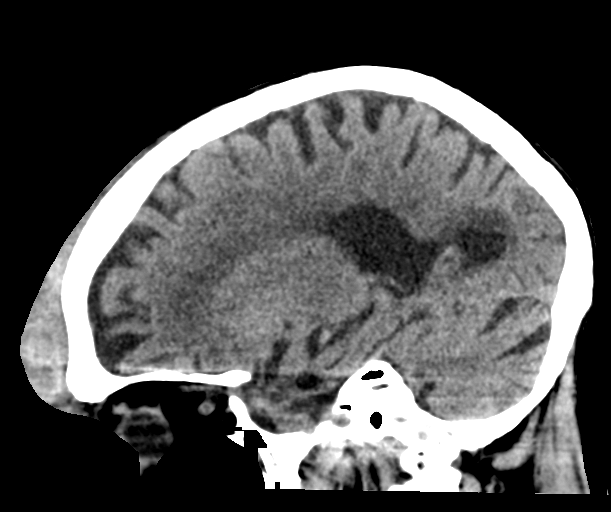

[Series 602: head without recons · axial · non-contrast · 0.44mm/px · z∈[-71,+29]mm · 5 of 31 slices shown, 7 images]
[im 6/31  brain]
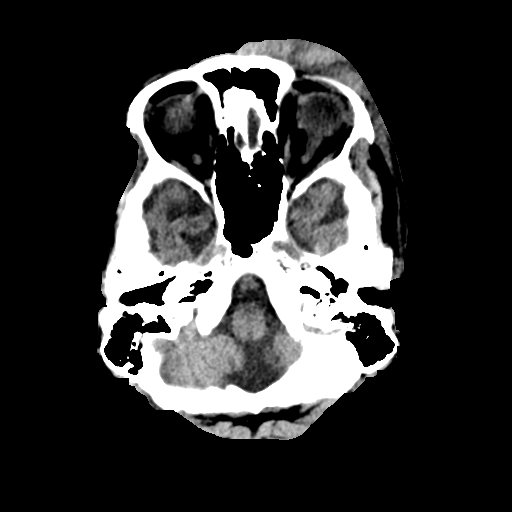
[im 6/31  bone]
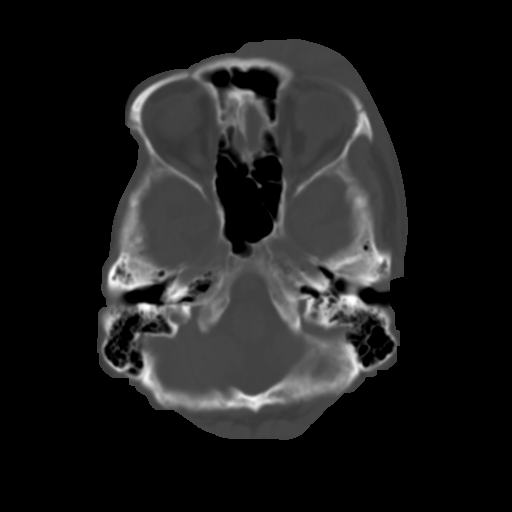
[im 11/31  brain]
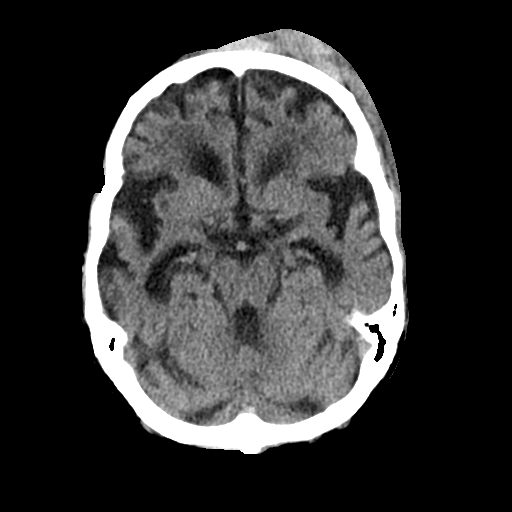
[im 16/31  brain]
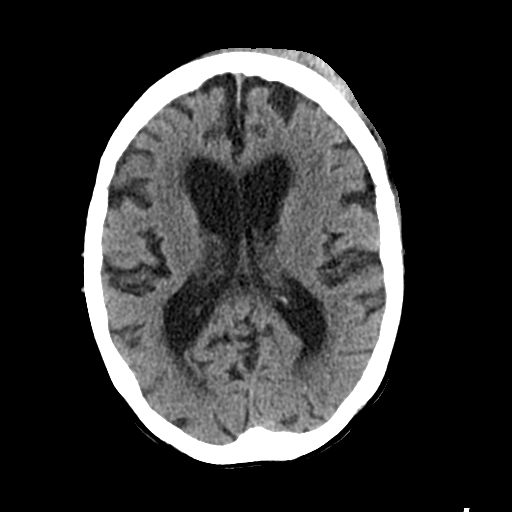
[im 21/31  brain]
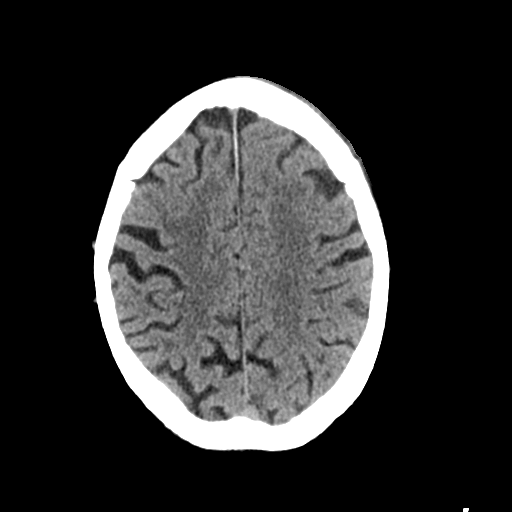
[im 26/31  brain]
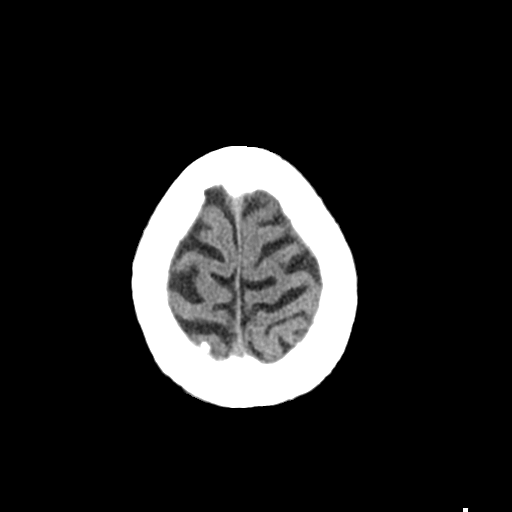
[im 26/31  bone]
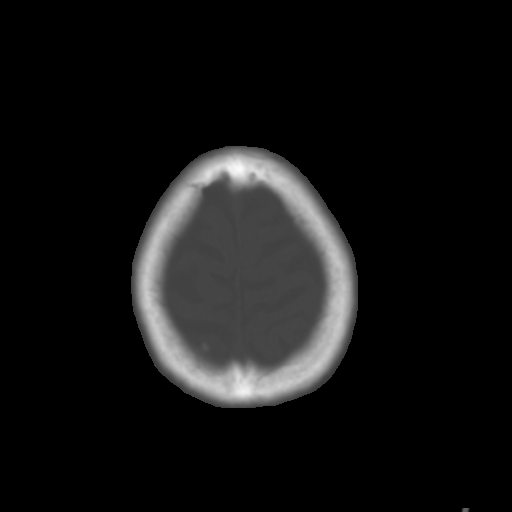

[Series 603: bone recons · axial · 0.44mm/px · z∈[-88,-68]mm · 2 of 76 slices shown]
[im 5/76  bone]
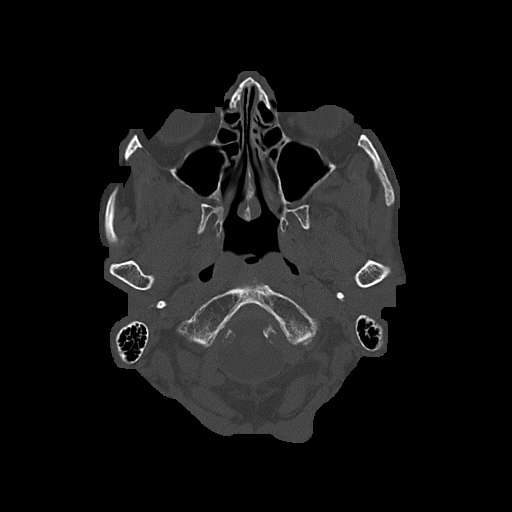
[im 15/76  bone]
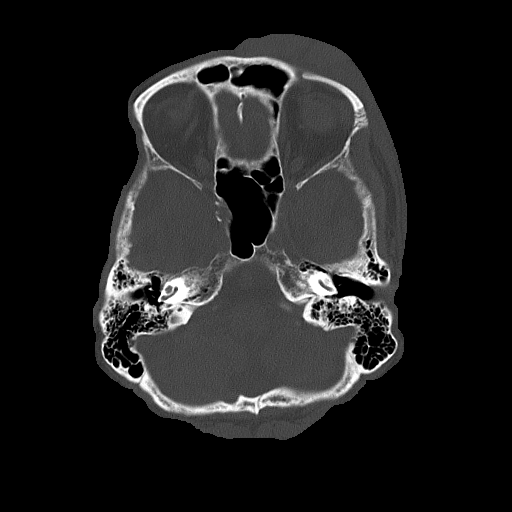

[17 of 47 positions shown; findings below may reference images not displayed]

FINDINGS: Brain: No evidence of acute infarction, hemorrhage, hydrocephalus,
extra-axial collection or mass lesion/mass effect.

Cortical and central atrophy. Subcortical white matter and
periventricular small vessel ischemic changes.

Vascular: Intracranial atherosclerosis.

Skull: Normal. Negative for fracture or focal lesion.

Sinuses/Orbits: The visualized paranasal sinuses are essentially
clear. The mastoid air cells are unopacified.

Other: Large extracranial hematoma overlying the left paramidline
frontal bone (series 3/image 10), new. Additional small to moderate
extracranial hematoma overlying the left frontal bone (series
3/image 17), unchanged. Associated soft tissue swelling, including
overlying the left orbit and lateral zygoma, unchanged.
IMPRESSION: New large extracranial hematoma overlying the left frontal bone. No
evidence of calvarial fracture.

Otherwise unchanged, as above.

No evidence of acute intracranial abnormality.
